# Patient Record
Sex: Male | Born: 1935
Health system: Southern US, Community
[De-identification: ages and names within clinical notes are randomized; demographics above are authoritative.]

## PROBLEM LIST (undated history)

## (undated) DIAGNOSIS — I739 Peripheral vascular disease, unspecified: Secondary | ICD-10-CM

## (undated) DIAGNOSIS — R339 Retention of urine, unspecified: Secondary | ICD-10-CM

## (undated) DIAGNOSIS — I10 Essential (primary) hypertension: Secondary | ICD-10-CM

## (undated) DIAGNOSIS — K759 Inflammatory liver disease, unspecified: Secondary | ICD-10-CM

## (undated) DIAGNOSIS — N200 Calculus of kidney: Secondary | ICD-10-CM

## (undated) DIAGNOSIS — R7989 Other specified abnormal findings of blood chemistry: Secondary | ICD-10-CM

## (undated) DIAGNOSIS — C61 Malignant neoplasm of prostate: Secondary | ICD-10-CM

## (undated) DIAGNOSIS — M549 Dorsalgia, unspecified: Secondary | ICD-10-CM

## (undated) DIAGNOSIS — R351 Nocturia: Secondary | ICD-10-CM

## (undated) DIAGNOSIS — L97909 Non-pressure chronic ulcer of unspecified part of unspecified lower leg with unspecified severity: Secondary | ICD-10-CM

## (undated) DIAGNOSIS — R945 Abnormal results of liver function studies: Secondary | ICD-10-CM

## (undated) DIAGNOSIS — E785 Hyperlipidemia, unspecified: Secondary | ICD-10-CM

## (undated) HISTORY — PX: TRANSLUMINAL ANGIOPLASTY: SHX274

## (undated) HISTORY — DX: Hyperlipidemia, unspecified: E78.5

## (undated) HISTORY — DX: Non-pressure chronic ulcer of unspecified part of unspecified lower leg with unspecified severity: L97.909

## (undated) HISTORY — DX: Nocturia: R35.1

## (undated) HISTORY — DX: Calculus of kidney: N20.0

## (undated) HISTORY — DX: Essential (primary) hypertension: I10

## (undated) HISTORY — DX: Retention of urine, unspecified: R33.9

## (undated) HISTORY — DX: Abnormal results of liver function studies: R94.5

## (undated) HISTORY — DX: Inflammatory liver disease, unspecified: K75.9

## (undated) HISTORY — DX: Malignant neoplasm of prostate: C61

## (undated) HISTORY — PX: PERITONEAL CATHETER INSERTION: SHX2223

## (undated) HISTORY — DX: Peripheral vascular disease, unspecified: I73.9

## (undated) HISTORY — PX: PROSTATE SURGERY: SHX751

## (undated) HISTORY — DX: Dorsalgia, unspecified: M54.9

## (undated) HISTORY — PX: AORTOGRAM: SHX6300

## (undated) HISTORY — DX: Other specified abnormal findings of blood chemistry: R79.89

---

## 2005-01-03 ENCOUNTER — Emergency Department: Payer: Self-pay | Admitting: Emergency Medicine

## 2005-07-18 ENCOUNTER — Ambulatory Visit: Payer: Self-pay

## 2005-08-12 ENCOUNTER — Ambulatory Visit: Payer: Self-pay

## 2005-09-04 ENCOUNTER — Ambulatory Visit: Payer: Self-pay | Admitting: Anesthesiology

## 2005-09-24 ENCOUNTER — Ambulatory Visit: Payer: Self-pay | Admitting: Anesthesiology

## 2005-10-07 ENCOUNTER — Ambulatory Visit: Payer: Self-pay | Admitting: Anesthesiology

## 2005-10-28 ENCOUNTER — Ambulatory Visit: Payer: Self-pay | Admitting: Anesthesiology

## 2005-12-18 ENCOUNTER — Ambulatory Visit: Payer: Self-pay | Admitting: Anesthesiology

## 2006-11-25 ENCOUNTER — Ambulatory Visit: Payer: Self-pay | Admitting: Urology

## 2006-11-25 ENCOUNTER — Other Ambulatory Visit: Payer: Self-pay

## 2006-12-02 ENCOUNTER — Ambulatory Visit: Payer: Self-pay | Admitting: Urology

## 2007-05-07 ENCOUNTER — Ambulatory Visit: Payer: Self-pay | Admitting: Gastroenterology

## 2007-07-14 ENCOUNTER — Ambulatory Visit: Payer: Self-pay | Admitting: General Practice

## 2007-08-27 ENCOUNTER — Ambulatory Visit: Payer: Self-pay | Admitting: Family Medicine

## 2010-04-05 ENCOUNTER — Ambulatory Visit: Payer: Self-pay | Admitting: Surgery

## 2010-04-09 ENCOUNTER — Ambulatory Visit: Payer: Self-pay | Admitting: Surgery

## 2010-10-16 ENCOUNTER — Ambulatory Visit: Payer: Self-pay | Admitting: Ophthalmology

## 2011-08-05 ENCOUNTER — Ambulatory Visit: Payer: Self-pay | Admitting: Vascular Surgery

## 2012-08-13 ENCOUNTER — Ambulatory Visit: Payer: Self-pay | Admitting: Family Medicine

## 2012-10-01 ENCOUNTER — Ambulatory Visit: Payer: Self-pay | Admitting: Urology

## 2012-10-15 ENCOUNTER — Ambulatory Visit: Payer: Self-pay | Admitting: Vascular Surgery

## 2012-10-15 LAB — CREATININE, SERUM
Creatinine: 1.87 mg/dL — ABNORMAL HIGH (ref 0.60–1.30)
EGFR (African American): 38 — ABNORMAL LOW
EGFR (Non-African Amer.): 33 — ABNORMAL LOW

## 2012-10-15 LAB — BUN: BUN: 22 mg/dL — ABNORMAL HIGH (ref 7–18)

## 2014-05-17 DIAGNOSIS — L568 Other specified acute skin changes due to ultraviolet radiation: Secondary | ICD-10-CM | POA: Insufficient documentation

## 2014-05-17 DIAGNOSIS — E7800 Pure hypercholesterolemia, unspecified: Secondary | ICD-10-CM | POA: Insufficient documentation

## 2014-05-17 DIAGNOSIS — I10 Essential (primary) hypertension: Secondary | ICD-10-CM | POA: Insufficient documentation

## 2015-04-18 NOTE — Op Note (Signed)
PATIENT NAME:  Jon Smith, Jon Smith MR#:  [id number] DATE OF BIRTH:  [birth year]  DATE OF PROCEDURE:  10/15/2012  PREOPERATIVE DIAGNOSES:  1. Peripheral arterial disease with rest pain, right lower extremity.  2. Status post previous left lower extremity intervention for ulceration.  3. Hypertension.   POSTOPERATIVE DIAGNOSES:  1. Peripheral arterial disease with rest pain, right lower extremity.  2. Status post previous left lower extremity intervention for ulceration.  3. Hypertension.   PROCEDURES:  1. Catheter placement to right peroneal artery from left femoral approach.  2. Aortogram and selective right lower extremity angiogram.  3. Percutaneous transluminal angioplasty of right tibioperoneal trunk and peroneal artery with a 3-mm diameter angioplasty balloon.   SURGEON: Algernon Huxley, M.D.   ANESTHESIA: Local with moderate conscious sedation.   ESTIMATED BLOOD LOSS:  Minimal.  FLUOROSCOPY TIME: Five minutes.   CONTRAST:  55 mL.   INDICATION FOR PROCEDURE: This is an 81 year old African American male with peripheral vascular disease. He has been complaining of worsening pain of the right foot and now has pain at rest. Noninvasive study showed some significant tibial disease. He is brought for evaluation with angiography and possible revascularization. Risks and benefits were discussed. Informed consent was obtained.   DESCRIPTION OF PROCEDURE: The patient is brought to the vascular and interventional radiology suite. His groin is sterilely prepped and draped and a sterile surgical field was created. The left femoral head was localized with fluoroscopy and the left femoral artery was accessed without difficulty with a Seldinger needle. A J-wire and 5 French sheath were placed. A pigtail catheter was placed in the aorta at the L1 level and AP aortogram was performed. This showed no flow-limiting stenosis in the aortoiliac segments. There was some mild disease of the left iliac artery. The  renal arteries were patent. There were some calcific changes to most of his aortic and iliac vessels. I hooked the aortic bifurcation and advanced to the right femoral head and selective right lower extremity angiogram was then performed. This demonstrated patent common femoral artery and profunda femoris artery. The superficial femoral artery had an approximately 40% stenosis in the distal segment but this was not flow-limiting. The popliteal artery was patent with mild disease but the tibioperoneal trunk was then occluded. The peroneal artery reconstituted about 6 to 8 cm downstream and was the best runoff, and then at the level of the ankle there was reconstitution of both the posterior tibial and anterior tibial arteries. The patient was systemically heparinized. A 6 French Ansel sheath was placed over a Terumo advantage wire. I was able to cross this occlusion with minimal difficulty and confirm intraluminal flow with a 135 diagnostic catheter in the mid peroneal artery. I then exchanged for an 0.018 wire. Percutaneous transluminal angioplasty was performed with a 3-mm diameter angioplasty balloon encompassing the tibioperoneal trunk and the peroneal artery occlusion. A waist was taken which resolved with angioplasty. Following angioplasty there was some vasospasm of the distal peroneal artery that improved with topical nitroglycerin administration. Markedly improved flow was seen and no significant residual stenosis. At this point I elected to terminate the procedure. The sheath was pulled back to the ipsilateral external iliac artery and oblique arteriogram was performed. StarClose closure device was deployed in the usual fashion with excellent hemostatic result. The patient tolerated the procedure well and was taken to the recovery room in stable condition.     ____________________________ Algernon Huxley, MD jsd:bjt D: 10/15/2012 14:49:44 ET T: 10/15/2012 15:37:05 ET JOB#: [id number]  cc: Algernon Huxley,  MD, <Dictator> Algernon Huxley MD ELECTRONICALLY SIGNED 10/19/2012 13:10

## 2015-08-30 ENCOUNTER — Encounter: Payer: Self-pay | Admitting: *Deleted

## 2015-08-30 ENCOUNTER — Emergency Department
Admission: EM | Admit: 2015-08-30 | Discharge: 2015-08-31 | Disposition: A | Discharge status: Home / Self Care | Payer: Medicare Other | Attending: Emergency Medicine | Admitting: Emergency Medicine

## 2015-08-30 DIAGNOSIS — Y998 Other external cause status: Secondary | ICD-10-CM | POA: Insufficient documentation

## 2015-08-30 DIAGNOSIS — W19XXXA Unspecified fall, initial encounter: Secondary | ICD-10-CM

## 2015-08-30 DIAGNOSIS — Y92002 Bathroom of unspecified non-institutional (private) residence single-family (private) house as the place of occurrence of the external cause: Secondary | ICD-10-CM | POA: Diagnosis not present

## 2015-08-30 DIAGNOSIS — Z720 Tobacco use: Secondary | ICD-10-CM | POA: Insufficient documentation

## 2015-08-30 DIAGNOSIS — Y93E1 Activity, personal bathing and showering: Secondary | ICD-10-CM | POA: Diagnosis not present

## 2015-08-30 DIAGNOSIS — S20211A Contusion of right front wall of thorax, initial encounter: Secondary | ICD-10-CM | POA: Insufficient documentation

## 2015-08-30 DIAGNOSIS — W16212A Fall in (into) filled bathtub causing other injury, initial encounter: Secondary | ICD-10-CM | POA: Diagnosis not present

## 2015-08-30 DIAGNOSIS — S24109A Unspecified injury at unspecified level of thoracic spinal cord, initial encounter: Secondary | ICD-10-CM | POA: Diagnosis not present

## 2015-08-30 DIAGNOSIS — M546 Pain in thoracic spine: Secondary | ICD-10-CM

## 2015-08-30 NOTE — ED Notes (Signed)
Pt to triage via wheelchair.  Pt has back pain after falling in the bathtub tonight.  No loc.  No vomiting.  Pt alert.

## 2015-08-31 ENCOUNTER — Emergency Department: Payer: Medicare Other

## 2015-08-31 ENCOUNTER — Other Ambulatory Visit: Payer: Self-pay

## 2015-08-31 DIAGNOSIS — S24109A Unspecified injury at unspecified level of thoracic spinal cord, initial encounter: Secondary | ICD-10-CM | POA: Diagnosis not present

## 2015-08-31 MED ORDER — HYDROCODONE-ACETAMINOPHEN 5-325 MG PO TABS
1.0000 | ORAL_TABLET | Freq: Once | ORAL | Status: AC
  Administered 2015-08-31: 1 via ORAL
  Filled 2015-08-31: qty 1

## 2015-08-31 MED ORDER — ONDANSETRON 4 MG PO TBDP
4.0000 mg | ORAL_TABLET | Freq: Once | ORAL | Status: AC
  Administered 2015-08-31: 4 mg via ORAL
  Filled 2015-08-31: qty 1

## 2015-08-31 MED ORDER — HYDROCODONE-ACETAMINOPHEN 5-325 MG PO TABS: 1.0000 | ORAL_TABLET | Freq: Four times a day (QID) | ORAL | Status: DC | PRN

## 2015-08-31 NOTE — ED Provider Notes (Signed)
[facility] Emergency Department Provider Note  ____________________________________________  Time seen: Approximately 12:14 AM  I have reviewed the triage vital signs and the nursing notes.   HISTORY  Chief Complaint Back Pain    HPI Jon Smith is a 81 y.o. male who presents to the ED from home with a chief complaint of back pain s/p mechanical fall in bathtub approximately 6 PM. Patient states he was showering when he slipped and fell, striking his right upper back against the tub. He denies striking head or LOC. Complains of 6/10 pain to right upper back which is sharp and nonradiating, worse with movement and deep breathing. Denies headache, neck pain, shortness of breath, abdominal pain, hematuria, numbness, tingling.   Past medical history 1.Peripheral arterial disease with rest pain, right lower extremity.  2.Status post previous left lower extremity intervention for ulceration.  3.Hypertension.   There are no active problems to display for this patient.  Past surgical history 1.Catheter placement to right peroneal artery from left femoral approach.  2.Aortogram and selective right lower extremity angiogram.  3.Percutaneous transluminal angioplasty of right tibioperoneal trunk and peroneal artery with a 3-mm diameter angioplasty balloon.   No current outpatient prescriptions on file.  Allergies Aspirin  No family history on file.  Social History Social History  Substance Use Topics  . Smoking status: Current Every Day Smoker  . Smokeless tobacco: Not on file  . Alcohol Use: No    Review of Systems Constitutional: No fever/chills Eyes: No visual changes. ENT: No sore throat. Cardiovascular: Denies chest pain. Respiratory: Denies shortness of breath. Gastrointestinal: No abdominal pain.  No nausea, no vomiting.  No diarrhea.  No constipation. Genitourinary: Negative for  dysuria. Musculoskeletal: Positive for back pain. Skin: Negative for rash. Neurological: Negative for headaches, focal weakness or numbness.  10-point ROS otherwise negative.  ____________________________________________   PHYSICAL EXAM:  VITAL SIGNS: ED Triage Vitals  Enc Vitals Group     BP 08/30/15 2204 160/71 mmHg     Pulse Rate 08/30/15 2204 71     Resp 08/30/15 2204 20     Temp 08/30/15 2204 98.2 F (36.8 C)     Temp Source 08/30/15 2204 Oral     SpO2 08/30/15 2204 100 %     Weight 08/30/15 2204 150 lb (68.04 kg)     Height 08/30/15 2204 5\' 9"  (1.753 m)     Head Cir --      Peak Flow --      Pain Score 08/30/15 2206 5     Pain Loc --      Pain Edu? --      Excl. in [place]? --     Constitutional: Alert and oriented. Well appearing and in no acute distress. Eyes: Conjunctivae are normal. PERRL. EOMI. Head: Atraumatic. Nose: No congestion/rhinnorhea. Mouth/Throat: Mucous membranes are moist.  Oropharynx non-erythematous. Neck: No stridor. No cervical spine tenderness to palpation. No step-offs or deformities noted. Cardiovascular: Normal rate, regular rhythm. Grossly normal heart sounds.  Good peripheral circulation. Respiratory: Normal respiratory effort.  No retractions. Lungs CTAB. Minimal splinting. Gastrointestinal: Soft and nontender. No distention. No abdominal bruits. No CVA tenderness. Musculoskeletal: Right upper thoracic back tender to palpation along upper posterior ribs. There is no midline spinal tenderness to thoracic or lumbar spine. No lower extremity tenderness nor edema.  No joint effusions. Neurologic:  Normal speech and language. No gross focal neurologic deficits are appreciated. No gait instability. Skin:  Skin is warm, dry and intact. No rash  noted. Psychiatric: Mood and affect are normal. Speech and behavior are normal.  ____________________________________________   LABS (all labs ordered are listed, but only abnormal results are  displayed)  Labs Reviewed - No data to display ____________________________________________  EKG  ED ECG REPORT I, Tyan Dy J, the attending physician, personally viewed and interpreted this ECG.   Date: 08/31/2015  EKG Time: 0008  Rate: 73  Rhythm: normal EKG, normal sinus rhythm  Axis: Normal  Intervals:none  ST&T Change: Nonspecific  ____________________________________________  RADIOLOGY  Chest 2 view (view by me, interpreted per Dr. Radene Knee): No acute cardiopulmonary process seen. No displaced rib fractures identified.  ____________________________________________   PROCEDURES  Procedure(s) performed: None  Critical Care performed: No  ____________________________________________   INITIAL IMPRESSION / ASSESSMENT AND PLAN / ED COURSE  Pertinent labs & imaging results that were available during my care of the patient were reviewed by me and considered in my medical decision making (see chart for details).  81 year old male s/p mechanical fall with right upper back pain. Will obtain imaging study to evaluate for rib fracture or pneumothorax. Will administer analgesia and reassess.  ----------------------------------------- 1:42 AM on 08/31/2015 -----------------------------------------  Patient improved. Impatient for discharge. Room air sats remained 100%. Strict return precautions given. Patient and family verbalized understanding and agree with plan of care. ____________________________________________   FINAL CLINICAL IMPRESSION(S) / ED DIAGNOSES  Final diagnoses:  Fall, initial encounter  Right-sided thoracic back pain  Rib contusion, right, initial encounter      Paulette Blanch, MD 08/31/15 [phone]

## 2015-08-31 NOTE — ED Notes (Signed)
Patient discharged to home per MD order. Patient in stable condition, and deemed medically cleared by ED provider for discharge. Discharge instructions reviewed with patient/family using "Teach Back"; verbalized understanding of medication education and administration, and information about follow-up care. Denies further concerns. ° °

## 2015-08-31 NOTE — Discharge Instructions (Signed)
1. Take pain medicine as needed (Norco #15). 2. Apply moist heat to affected area several times daily. 3. Return to the ER for worsening symptoms, fever, productive cough, difficulty breathing or other concerns.  Fall Prevention and Home Safety Falls cause injuries and can affect all age groups. It is possible to prevent falls.  HOW TO PREVENT FALLS  Wear shoes with rubber soles that do not have an opening for your toes.  Keep the inside and outside of your house well lit.  Use night lights throughout your home.  Remove clutter from floors.  Clean up floor spills.  Remove throw rugs or fasten them to the floor with carpet tape.  Do not place electrical cords across pathways.  Put grab bars by your tub, shower, and toilet. Do not use towel bars as grab bars.  Put handrails on both sides of the stairway. Fix loose handrails.  Do not climb on stools or stepladders, if possible.  Do not wax your floors.  Repair uneven or unsafe sidewalks, walkways, or stairs.  Keep items you use a lot within reach.  Be aware of pets.  Keep emergency numbers next to the telephone.  Put smoke detectors in your home and near bedrooms. Ask your doctor what other things you can do to prevent falls. Document Released: 10/12/2009 Document Revised: 06/16/2012 Document Reviewed: 03/17/2012 [facility] Patient Information 2015 [place], [place]. This information is not intended to replace advice given to you by your health care provider. Make sure you discuss any questions you have with your health care provider.  Back Pain, Adult Back pain is very common. The pain often gets better over time. The cause of back pain is usually not dangerous. Most people can learn to manage their back pain on their own.  HOME CARE   Stay active. Start with short walks on flat ground if you can. Try to walk farther each day.  Do not sit, drive, or stand in one place for more than 30 minutes. Do not stay in bed.  Do not  avoid exercise or work. Activity can help your back heal faster.  Be careful when you bend or lift an object. Bend at your knees, keep the object close to you, and do not twist.  Sleep on a firm mattress. Lie on your side, and bend your knees. If you lie on your back, put a pillow under your knees.  Only take medicines as told by your doctor.  Put ice on the injured area.  Put ice in a plastic bag.  Place a towel between your skin and the bag.  Leave the ice on for 15-20 minutes, 03-04 times a day for the first 2 to 3 days. After that, you can switch between ice and heat packs.  Ask your doctor about back exercises or massage.  Avoid feeling anxious or stressed. Find good ways to deal with stress, such as exercise. GET HELP RIGHT AWAY IF:   Your pain does not go away with rest or medicine.  Your pain does not go away in 1 week.  You have new problems.  You do not feel well.  The pain spreads into your legs.  You cannot control when you poop (bowel movement) or pee (urinate).  Your arms or legs feel weak or lose feeling (numbness).  You feel sick to your stomach (nauseous) or throw up (vomit).  You have belly (abdominal) pain.  You feel like you may pass out (faint). MAKE SURE YOU:   Understand  these instructions.  Will watch your condition.  Will get help right away if you are not doing well or get worse. Document Released: 06/03/2008 Document Revised: 03/09/2012 Document Reviewed: 04/19/2014 [facility] Patient Information 2015 [place], [place]. This information is not intended to replace advice given to you by your health care provider. Make sure you discuss any questions you have with your health care provider.  Rib Contusion A rib contusion (bruise) can occur by a blow to the chest or by a fall against a hard object. Usually these will be much better in a couple weeks. If X-rays were taken today and there are no broken bones (fractures), the diagnosis of bruising is  made. However, broken ribs may not show up for several days, or may be discovered later on a routine X-ray when signs of healing show up. If this happens to you, it does not mean that something was missed on the X-ray, but simply that it did not show up on the first X-rays. Earlier diagnosis will not usually change the treatment. HOME CARE INSTRUCTIONS   Avoid strenuous activity. Be careful during activities and avoid bumping the injured ribs. Activities that pull on the injured ribs and cause pain should be avoided, if possible.  For the first day or two, an ice pack used every 20 minutes while awake may be helpful. Put ice in a plastic bag and put a towel between the bag and the skin.  Eat a normal, well-balanced diet. Drink plenty of fluids to avoid constipation.  Take deep breaths several times a day to keep lungs free of infection. Try to cough several times a day. Splint the injured area with a pillow while coughing to ease pain. Coughing can help prevent pneumonia.  Wear a rib belt or binder only if told to do so by your caregiver. If you are wearing a rib belt or binder, you must do the breathing exercises as directed by your caregiver. If not used properly, rib belts or binders restrict breathing which can lead to pneumonia.  Only take over-the-counter or prescription medicines for pain, discomfort, or fever as directed by your caregiver. SEEK MEDICAL CARE IF:   You or your child has an oral temperature above 102 F (38.9 C).  Your baby is older than 3 months with a rectal temperature of 100.5 F (38.1 C) or higher for more than 1 day.  You develop a cough, with thick or bloody sputum. SEEK IMMEDIATE MEDICAL CARE IF:   You have difficulty breathing.  You feel sick to your stomach (nausea), have vomiting or belly (abdominal) pain.  You have worsening pain, not controlled with medications, or there is a change in the location of the pain.  You develop sweating or radiation of  the pain into the arms, jaw or shoulders, or become light headed or faint.  You or your child has an oral temperature above 102 F (38.9 C), not controlled by medicine.  Your or your baby is older than 3 months with a rectal temperature of 102 F (38.9 C) or higher.  Your baby is 66 months old or younger with a rectal temperature of 100.4 F (38 C) or higher. MAKE SURE YOU:   Understand these instructions.  Will watch your condition.  Will get help right away if you are not doing well or get worse. Document Released: 09/10/2001 Document Revised: 04/12/2013 Document Reviewed: 08/03/2008 [facility] Patient Information 2015 [place], [place]. This information is not intended to replace advice given to you by  your health care provider. Make sure you discuss any questions you have with your health care provider. ° °

## 2015-09-18 DIAGNOSIS — Z8546 Personal history of malignant neoplasm of prostate: Secondary | ICD-10-CM | POA: Insufficient documentation

## 2015-09-25 ENCOUNTER — Encounter: Payer: Self-pay | Admitting: *Deleted

## 2015-09-26 ENCOUNTER — Encounter: Payer: Self-pay | Admitting: *Deleted

## 2015-09-27 ENCOUNTER — Ambulatory Visit (INDEPENDENT_AMBULATORY_CARE_PROVIDER_SITE_OTHER): Payer: Medicare Other | Admitting: Obstetrics and Gynecology

## 2015-09-27 ENCOUNTER — Encounter: Payer: Self-pay | Admitting: Obstetrics and Gynecology

## 2015-09-27 VITALS — BP 140/68 | HR 72 | Resp 16 | Ht 69.0 in | Wt 141.3 lb

## 2015-09-27 DIAGNOSIS — R972 Elevated prostate specific antigen [PSA]: Secondary | ICD-10-CM

## 2015-09-27 DIAGNOSIS — R339 Retention of urine, unspecified: Secondary | ICD-10-CM | POA: Diagnosis not present

## 2015-09-27 LAB — BLADDER SCAN AMB NON-IMAGING

## 2015-09-27 MED ORDER — TAMSULOSIN HCL 0.4 MG PO CAPS: 0.4000 mg | ORAL_CAPSULE | Freq: Every day | ORAL | Status: DC

## 2015-09-27 NOTE — Progress Notes (Signed)
09/27/2015 1:14 PM   Jon Smith 12/15/1928 [id number]  Referring provider: Annice Needy, MD No address on file  Chief Complaint  Patient presents with  . Elevated PSA  . Establish Care    HPI: Patient is a 81 year old African-American male with a history of prostate cancer presenting today as a referral from his primary care provider for an elevated PSA of 5.44. Patient states that he was previously treated for his prostate cancer by Dr. Ernst Spell approximately 15-20 years ago using cryo ablation. He states that he never heard anything else from the office after his procedure and he has not followed up since. It seems that most recent PSA may be the only one checked since his cryoablation.  Patient denies frequency, urgency, bone pain or unexplained weight loss. He does report nocturia 1-2 times per night, weak stream and occasional sensation of incomplete bladder emptying.  He is a current 0.5 pack per day smoker 40 years.  He does report a history of kidney stones last stone episode approximately 8-9 years ago.  PMH: Past Medical History  Diagnosis Date  . Peripheral arterial disease   . Hypertension   . Lower extremity ulceration   . Back pain   . Prostate cancer 1996    Surgical History: Past Surgical History  Procedure Laterality Date  . Peritoneal catheter insertion    . Transluminal angioplasty    . Aortogram    . Prostate surgery      Home Medications:    Medication List       This list is accurate as of: 09/27/15  1:14 PM.  Always use your most recent med list.               amLODipine 5 MG tablet  Commonly known as:  NORVASC  Take by mouth.     aspirin EC 81 MG tablet  Take by mouth.     latanoprost 0.005 % ophthalmic solution  Commonly known as:  XALATAN     lisinopril 20 MG tablet  Commonly known as:  PRINIVIL,ZESTRIL     tamsulosin 0.4 MG Caps capsule  Commonly known as:  FLOMAX  Take 1 capsule (0.4 mg total) by mouth daily. At  bedtime        Allergies:  Allergies  Allergen Reactions  . Aspirin Nausea And Vomiting    Family History: Family History  Problem Relation Age of Onset  . Heart disease Brother   . Kidney Stones Sister     Social History:  reports that he has been smoking Cigarettes.  He does not have any smokeless tobacco history on file. He reports that he does not drink alcohol. His drug history is not on file.  ROS: UROLOGY Frequent Urination?: No Hard to postpone urination?: No Burning/pain with urination?: No Get up at night to urinate?: Yes Leakage of urine?: No Urine stream starts and stops?: Yes Trouble starting stream?: No Do you have to strain to urinate?: No Blood in urine?: No Urinary tract infection?: No Sexually transmitted disease?: No Injury to kidneys or bladder?: No Painful intercourse?: No Weak stream?: Yes Erection problems?: No Penile pain?: No  Gastrointestinal Nausea?: No Vomiting?: No Indigestion/heartburn?: No Diarrhea?: No Constipation?: No  Constitutional Fever: No Night sweats?: No Weight loss?: Yes Fatigue?: No  Skin Skin rash/lesions?: No Itching?: No  Eyes Blurred vision?: No Double vision?: No  Ears/Nose/Throat Sore throat?: No Sinus problems?: No  Hematologic/Lymphatic Swollen glands?: No Easy bruising?: No  Cardiovascular Leg swelling?: No Chest  pain?: No  Respiratory Cough?: No Shortness of breath?: No  Endocrine Excessive thirst?: No  Musculoskeletal Back pain?: No Joint pain?: No  Neurological Headaches?: No Dizziness?: No  Psychologic Depression?: No Anxiety?: Yes  Physical Exam: BP 140/68 mmHg  Pulse 72  Resp 16  Ht 5\' 9"  (1.753 m)  Wt 141 lb 4.8 oz (64.093 kg)  BMI 20.86 kg/m2  Constitutional:  Alert and oriented, No acute distress. HEENT: [place] AT, moist mucus membranes.  Trachea midline, no masses. Cardiovascular: No clubbing, cyanosis, or edema. Respiratory: Normal respiratory effort, no  increased work of breathing. GI: Abdomen is soft, nontender, nondistended, no abdominal masses GU: No CVA tenderness, testicles descended bilaterally without masses or tenderness, small right hydrocele  DRE:no clear margins palpapated, very small soft prostate Skin: No rashes, bruises or suspicious lesions. Lymph: No cervical or inguinal adenopathy. Neurologic: Grossly intact, no focal deficits, moving all 4 extremities. Psychiatric: Normal mood and affect.  Laboratory Data: No results found for: WBC, HGB, HCT, MCV, PLT  Lab Results  Component Value Date   CREATININE 1.87* 10/15/2012    No results found for: PSA  No results found for: TESTOSTERONE  No results found for: HGBA1C  Urinalysis No results found for: COLORURINE, APPEARANCEUR, LABSPEC, PHURINE, GLUCOSEU, HGBUR, BILIRUBINUR, KETONESUR, PROTEINUR, UROBILINOGEN, NITRITE, LEUKOCYTESUR  Pertinent Imaging:   Assessment & Plan:   1. Prostate Cancer-  S/p cryoablation 20 years ago by Dr. Madelin Headings no with PSA of 5.44. DRE unremarkable today. Discussed case with Dr. Pilar Jarvis.  Recommedations to recheck PSA in 6 months.  If levels continue to rise we may need to consider initiation of ADT. - Urinalysis, Complete - BLADDER SCAN AMB NON-IMAGING  2. Incomplete Bladder emptying- Urinary complaints of weak stream and occasional sensation of incomplete bladder emptying. PVR 234mL. Will start tamsulosin 1 tab daily and hopefully improve bladder emptying.  Return in about 6 months (around 03/26/2016) for Repeat PSA/DRE/PVR.  Herbert Moors, [facility] [address] [place], [place] [address] [phone]

## 2015-09-29 LAB — CULTURE, URINE COMPREHENSIVE

## 2015-10-02 ENCOUNTER — Telehealth: Payer: Self-pay | Admitting: Obstetrics and Gynecology

## 2015-10-02 LAB — MICROSCOPIC EXAMINATION: RBC MICROSCOPIC, UA: NONE SEEN /HPF (ref 0–?)

## 2015-10-02 LAB — URINALYSIS, COMPLETE
Bilirubin, UA: NEGATIVE
GLUCOSE, UA: NEGATIVE
KETONES UA: NEGATIVE
NITRITE UA: NEGATIVE
PROTEIN UA: NEGATIVE
RBC, UA: NEGATIVE
SPEC GRAV UA: 1.02 (ref 1.005–1.030)
UUROB: 0.2 mg/dL (ref 0.2–1.0)
pH, UA: 5.5 (ref 5.0–7.5)

## 2015-10-02 MED ORDER — SULFAMETHOXAZOLE-TRIMETHOPRIM 800-160 MG PO TABS: 1.0000 | ORAL_TABLET | Freq: Two times a day (BID) | ORAL | Status: DC

## 2015-10-02 NOTE — Telephone Encounter (Signed)
Patient notified/SW 

## 2015-10-02 NOTE — Telephone Encounter (Signed)
Please notify patient that his urine culture was positive for infection. I have sent a prescription for Bactrim to his pharmacy which he needs to complete. Thanks

## 2015-12-06 ENCOUNTER — Emergency Department: Payer: Medicare Other

## 2015-12-06 ENCOUNTER — Inpatient Hospital Stay
Admit: 2015-12-06 | Discharge: 2015-12-06 | Disposition: A | Discharge status: Home / Self Care | Payer: Medicare Other | Attending: Internal Medicine | Admitting: Internal Medicine

## 2015-12-06 ENCOUNTER — Encounter: Payer: Self-pay | Admitting: *Deleted

## 2015-12-06 ENCOUNTER — Inpatient Hospital Stay
Admission: EM | Admit: 2015-12-06 | Discharge: 2015-12-13 | DRG: 682 | Disposition: A | Discharge status: Home / Self Care | Payer: Medicare Other | Attending: Internal Medicine | Admitting: Internal Medicine

## 2015-12-06 DIAGNOSIS — E861 Hypovolemia: Secondary | ICD-10-CM | POA: Diagnosis present

## 2015-12-06 DIAGNOSIS — R7989 Other specified abnormal findings of blood chemistry: Secondary | ICD-10-CM | POA: Diagnosis present

## 2015-12-06 DIAGNOSIS — Y9289 Other specified places as the place of occurrence of the external cause: Secondary | ICD-10-CM | POA: Diagnosis not present

## 2015-12-06 DIAGNOSIS — R55 Syncope and collapse: Secondary | ICD-10-CM | POA: Diagnosis present

## 2015-12-06 DIAGNOSIS — E43 Unspecified severe protein-calorie malnutrition: Secondary | ICD-10-CM | POA: Diagnosis present

## 2015-12-06 DIAGNOSIS — Z888 Allergy status to other drugs, medicaments and biological substances status: Secondary | ICD-10-CM | POA: Diagnosis not present

## 2015-12-06 DIAGNOSIS — B9620 Unspecified Escherichia coli [E. coli] as the cause of diseases classified elsewhere: Secondary | ICD-10-CM | POA: Diagnosis present

## 2015-12-06 DIAGNOSIS — D62 Acute posthemorrhagic anemia: Secondary | ICD-10-CM | POA: Diagnosis present

## 2015-12-06 DIAGNOSIS — Z8249 Family history of ischemic heart disease and other diseases of the circulatory system: Secondary | ICD-10-CM | POA: Diagnosis not present

## 2015-12-06 DIAGNOSIS — E871 Hypo-osmolality and hyponatremia: Secondary | ICD-10-CM | POA: Diagnosis present

## 2015-12-06 DIAGNOSIS — E785 Hyperlipidemia, unspecified: Secondary | ICD-10-CM | POA: Diagnosis present

## 2015-12-06 DIAGNOSIS — I739 Peripheral vascular disease, unspecified: Secondary | ICD-10-CM | POA: Diagnosis present

## 2015-12-06 DIAGNOSIS — N400 Enlarged prostate without lower urinary tract symptoms: Secondary | ICD-10-CM | POA: Diagnosis present

## 2015-12-06 DIAGNOSIS — K8020 Calculus of gallbladder without cholecystitis without obstruction: Secondary | ICD-10-CM | POA: Diagnosis present

## 2015-12-06 DIAGNOSIS — Z87442 Personal history of urinary calculi: Secondary | ICD-10-CM

## 2015-12-06 DIAGNOSIS — F17210 Nicotine dependence, cigarettes, uncomplicated: Secondary | ICD-10-CM | POA: Diagnosis present

## 2015-12-06 DIAGNOSIS — H409 Unspecified glaucoma: Secondary | ICD-10-CM | POA: Diagnosis present

## 2015-12-06 DIAGNOSIS — Z9889 Other specified postprocedural states: Secondary | ICD-10-CM | POA: Diagnosis not present

## 2015-12-06 DIAGNOSIS — Z6820 Body mass index (BMI) 20.0-20.9, adult: Secondary | ICD-10-CM

## 2015-12-06 DIAGNOSIS — E7800 Pure hypercholesterolemia, unspecified: Secondary | ICD-10-CM | POA: Diagnosis present

## 2015-12-06 DIAGNOSIS — I10 Essential (primary) hypertension: Secondary | ICD-10-CM | POA: Diagnosis present

## 2015-12-06 DIAGNOSIS — Z9861 Coronary angioplasty status: Secondary | ICD-10-CM

## 2015-12-06 DIAGNOSIS — Z8546 Personal history of malignant neoplasm of prostate: Secondary | ICD-10-CM

## 2015-12-06 DIAGNOSIS — Z79899 Other long term (current) drug therapy: Secondary | ICD-10-CM

## 2015-12-06 DIAGNOSIS — R310 Gross hematuria: Secondary | ICD-10-CM

## 2015-12-06 DIAGNOSIS — N179 Acute kidney failure, unspecified: Secondary | ICD-10-CM | POA: Diagnosis not present

## 2015-12-06 DIAGNOSIS — N323 Diverticulum of bladder: Secondary | ICD-10-CM | POA: Diagnosis present

## 2015-12-06 DIAGNOSIS — D649 Anemia, unspecified: Secondary | ICD-10-CM | POA: Diagnosis not present

## 2015-12-06 DIAGNOSIS — R972 Elevated prostate specific antigen [PSA]: Secondary | ICD-10-CM | POA: Diagnosis not present

## 2015-12-06 DIAGNOSIS — R630 Anorexia: Secondary | ICD-10-CM | POA: Diagnosis present

## 2015-12-06 DIAGNOSIS — Z7982 Long term (current) use of aspirin: Secondary | ICD-10-CM | POA: Diagnosis not present

## 2015-12-06 DIAGNOSIS — R778 Other specified abnormalities of plasma proteins: Secondary | ICD-10-CM

## 2015-12-06 DIAGNOSIS — N3289 Other specified disorders of bladder: Secondary | ICD-10-CM | POA: Diagnosis present

## 2015-12-06 DIAGNOSIS — T370X5A Adverse effect of sulfonamides, initial encounter: Secondary | ICD-10-CM | POA: Diagnosis present

## 2015-12-06 DIAGNOSIS — D689 Coagulation defect, unspecified: Secondary | ICD-10-CM | POA: Diagnosis present

## 2015-12-06 DIAGNOSIS — Z1623 Resistance to quinolones and fluoroquinolones: Secondary | ICD-10-CM | POA: Diagnosis present

## 2015-12-06 DIAGNOSIS — W19XXXA Unspecified fall, initial encounter: Secondary | ICD-10-CM | POA: Diagnosis present

## 2015-12-06 DIAGNOSIS — Z716 Tobacco abuse counseling: Secondary | ICD-10-CM

## 2015-12-06 DIAGNOSIS — E860 Dehydration: Secondary | ICD-10-CM | POA: Diagnosis present

## 2015-12-06 DIAGNOSIS — N390 Urinary tract infection, site not specified: Secondary | ICD-10-CM | POA: Diagnosis present

## 2015-12-06 DIAGNOSIS — R945 Abnormal results of liver function studies: Secondary | ICD-10-CM | POA: Diagnosis present

## 2015-12-06 DIAGNOSIS — B179 Acute viral hepatitis, unspecified: Secondary | ICD-10-CM | POA: Diagnosis present

## 2015-12-06 LAB — URINALYSIS COMPLETE WITH MICROSCOPIC (ARMC ONLY)
Bilirubin Urine: NEGATIVE
Glucose, UA: NEGATIVE mg/dL
KETONES UR: NEGATIVE mg/dL
Nitrite: NEGATIVE
PROTEIN: NEGATIVE mg/dL
SPECIFIC GRAVITY, URINE: 1.019 (ref 1.005–1.030)
SQUAMOUS EPITHELIAL / LPF: NONE SEEN
pH: 5 (ref 5.0–8.0)

## 2015-12-06 LAB — COMPREHENSIVE METABOLIC PANEL
ALBUMIN: 3.4 g/dL — AB (ref 3.5–5.0)
ALT: 1198 U/L — ABNORMAL HIGH (ref 17–63)
ANION GAP: 6 (ref 5–15)
AST: 936 U/L — AB (ref 15–41)
Alkaline Phosphatase: 352 U/L — ABNORMAL HIGH (ref 38–126)
BUN: 34 mg/dL — ABNORMAL HIGH (ref 6–20)
CO2: 21 mmol/L — AB (ref 22–32)
Calcium: 9.3 mg/dL (ref 8.9–10.3)
Chloride: 107 mmol/L (ref 101–111)
Creatinine, Ser: 1.6 mg/dL — ABNORMAL HIGH (ref 0.61–1.24)
GFR calc Af Amer: 44 mL/min — ABNORMAL LOW (ref >60)
GFR calc non Af Amer: 38 mL/min — ABNORMAL LOW (ref >60)
GLUCOSE: 122 mg/dL — AB (ref 65–99)
POTASSIUM: 4.4 mmol/L (ref 3.5–5.1)
SODIUM: 134 mmol/L — AB (ref 135–145)
Total Bilirubin: 1 mg/dL (ref 0.3–1.2)
Total Protein: 7.1 g/dL (ref 6.5–8.1)

## 2015-12-06 LAB — PROTIME-INR
INR: 1.47
Prothrombin Time: 17.9 seconds — ABNORMAL HIGH (ref 11.4–15.0)

## 2015-12-06 LAB — CBC WITH DIFFERENTIAL/PLATELET
BASOS ABS: 0.1 10*3/uL (ref 0–0.1)
Basophils Relative: 2 %
EOS PCT: 1 %
Eosinophils Absolute: 0 10*3/uL (ref 0–0.7)
HEMATOCRIT: 35 % — AB (ref 40.0–52.0)
HEMOGLOBIN: 11.3 g/dL — AB (ref 13.0–18.0)
LYMPHS PCT: 19 %
Lymphs Abs: 1 10*3/uL (ref 1.0–3.6)
MCH: 26.5 pg (ref 26.0–34.0)
MCHC: 32.2 g/dL (ref 32.0–36.0)
MCV: 82.2 fL (ref 80.0–100.0)
Monocytes Absolute: 0.6 10*3/uL (ref 0.2–1.0)
Monocytes Relative: 12 %
NEUTROS ABS: 3.3 10*3/uL (ref 1.4–6.5)
NEUTROS PCT: 66 %
PLATELETS: 236 10*3/uL (ref 150–440)
RBC: 4.26 MIL/uL — AB (ref 4.40–5.90)
RDW: 19.3 % — ABNORMAL HIGH (ref 11.5–14.5)
WBC: 5 10*3/uL (ref 3.8–10.6)

## 2015-12-06 LAB — TROPONIN I
Troponin I: 0.04 ng/mL — ABNORMAL HIGH (ref <0.031)
Troponin I: 0.03 ng/mL (ref <0.031)

## 2015-12-06 LAB — LACTIC ACID, PLASMA
LACTIC ACID, VENOUS: 2.1 mmol/L — AB (ref 0.5–2.0)
Lactic Acid, Venous: 2 mmol/L (ref 0.5–2.0)

## 2015-12-06 LAB — APTT: APTT: 33 s (ref 24–36)

## 2015-12-06 LAB — LIPASE, BLOOD: Lipase: 50 U/L (ref 11–51)

## 2015-12-06 MED ORDER — ACETAMINOPHEN 325 MG PO TABS: 650.0000 mg | ORAL_TABLET | Freq: Four times a day (QID) | ORAL | Status: DC | PRN

## 2015-12-06 MED ORDER — NICOTINE 14 MG/24HR TD PT24
14.0000 mg | MEDICATED_PATCH | Freq: Every day | TRANSDERMAL | Status: DC
Start: 2015-12-06 — End: 2015-12-13
  Administered 2015-12-06 – 2015-12-13 (×8): 14 mg via TRANSDERMAL
  Filled 2015-12-06 (×8): qty 1

## 2015-12-06 MED ORDER — AMLODIPINE BESYLATE 5 MG PO TABS
2.5000 mg | ORAL_TABLET | Freq: Every day | ORAL | Status: DC
  Administered 2015-12-06 – 2015-12-11 (×6): 2.5 mg via ORAL
  Filled 2015-12-06 (×7): qty 1

## 2015-12-06 MED ORDER — ONDANSETRON HCL 4 MG/2ML IJ SOLN
4.0000 mg | Freq: Four times a day (QID) | INTRAMUSCULAR | Status: DC | PRN
  Administered 2015-12-08: 4 mg via INTRAVENOUS
  Filled 2015-12-06: qty 2

## 2015-12-06 MED ORDER — CLOPIDOGREL BISULFATE 75 MG PO TABS
75.0000 mg | ORAL_TABLET | Freq: Every day | ORAL | Status: DC
  Administered 2015-12-06 – 2015-12-07 (×2): 75 mg via ORAL
  Filled 2015-12-06 (×2): qty 1

## 2015-12-06 MED ORDER — SODIUM CHLORIDE 0.9 % IV SOLN
INTRAVENOUS | Status: DC
  Administered 2015-12-06 – 2015-12-09 (×5): via INTRAVENOUS

## 2015-12-06 MED ORDER — HEPARIN SODIUM (PORCINE) 5000 UNIT/ML IJ SOLN
5000.0000 [IU] | Freq: Three times a day (TID) | INTRAMUSCULAR | Status: DC
Start: 2015-12-06 — End: 2015-12-09
  Administered 2015-12-06 – 2015-12-09 (×10): 5000 [IU] via SUBCUTANEOUS
  Filled 2015-12-06 (×10): qty 1

## 2015-12-06 MED ORDER — ACETAMINOPHEN 650 MG RE SUPP: 650.0000 mg | Freq: Four times a day (QID) | RECTAL | Status: DC | PRN

## 2015-12-06 MED ORDER — ALBUTEROL SULFATE (2.5 MG/3ML) 0.083% IN NEBU: 2.5000 mg | INHALATION_SOLUTION | RESPIRATORY_TRACT | Status: DC | PRN

## 2015-12-06 MED ORDER — ONDANSETRON HCL 4 MG PO TABS: 4.0000 mg | ORAL_TABLET | Freq: Four times a day (QID) | ORAL | Status: DC | PRN

## 2015-12-06 NOTE — H&P (Signed)
[facility] NAME: Jon Smith    MR#:  [id number]  DATE OF BIRTH:  13-Apr-1928  DATE OF ADMISSION:  12/06/2015  PRIMARY CARE PHYSICIAN: Annice Needy, MD   REQUESTING/REFERRING PHYSICIAN: Nena Polio, MD  CHIEF COMPLAINT:   Chief Complaint  Patient presents with  . Fall    HISTORY OF PRESENT ILLNESS:  Jon Smith  is a 81 y.o. male with a known history of hypertension, PAD and back pain. The patient has had the poor appetite and oral intake for the past the couple days. He feels not good in the restaurant and passed out for minutes today. But he denies any headache, dizziness or weakness. He denies any seizure or incontinence.  PAST MEDICAL HISTORY:   Past Medical History  Diagnosis Date  . Peripheral arterial disease ([place])   . Hypertension   . Lower extremity ulceration ([place])   . Back pain     PAST SURGICAL HISTORY:   Past Surgical History  Procedure Laterality Date  . Peritoneal catheter insertion    . Transluminal angioplasty    . Aortogram    . Prostate surgery      SOCIAL HISTORY:   Social History  Substance Use Topics  . Smoking status: Current Every Day Smoker    Types: Cigarettes  . Smokeless tobacco: Not on file  . Alcohol Use: No    FAMILY HISTORY:   Family History  Problem Relation Age of Onset  . Heart disease Brother   . Kidney Stones Sister     DRUG ALLERGIES:   Allergies  Allergen Reactions  . Aspirin Nausea And Vomiting    REVIEW OF SYSTEMS:  CONSTITUTIONAL: No fever, fatigue or weakness.  EYES: No blurred or double vision.  EARS, NOSE, AND THROAT: No tinnitus or ear pain.  RESPIRATORY: No cough, shortness of breath, wheezing or hemoptysis.  CARDIOVASCULAR: No chest pain, orthopnea, edema.  GASTROINTESTINAL: No nausea, vomiting, diarrhea or abdominal pain.  GENITOURINARY: No dysuria, hematuria.  ENDOCRINE: No polyuria, nocturia,  HEMATOLOGY: No anemia, easy  bruising or bleeding SKIN: No rash or lesion. MUSCULOSKELETAL: No joint pain or arthritis.   NEUROLOGIC: No tingling, numbness, weakness. Syncope but no seizure or incontinence. PSYCHIATRY: No anxiety or depression.   MEDICATIONS AT HOME:   Prior to Admission medications   Medication Sig Start Date End Date Taking? Authorizing Provider  amLODipine (NORVASC) 5 MG tablet Take by mouth. 12/01/14 12/01/15  Historical Provider, MD  aspirin EC 81 MG tablet Take by mouth.    Historical Provider, MD  latanoprost (XALATAN) 0.005 % ophthalmic solution  09/23/15   Historical Provider, MD  lisinopril (PRINIVIL,ZESTRIL) 20 MG tablet  09/25/15   Historical Provider, MD  sulfamethoxazole-trimethoprim (BACTRIM DS,SEPTRA DS) 800-160 MG tablet Take 1 tablet by mouth 2 (two) times daily. 10/02/15   Roda Shutters, FNP  tamsulosin (FLOMAX) 0.4 MG CAPS capsule Take 1 capsule (0.4 mg total) by mouth daily. At bedtime 09/27/15   Roda Shutters, FNP      VITAL SIGNS:  Blood pressure 157/70, pulse 74, temperature 98.6 F (37 C), temperature source Oral, resp. rate 22, height 5\' 9"  (1.753 m), weight 64.411 kg (142 lb), SpO2 100 %.  PHYSICAL EXAMINATION:  GENERAL:  81 y.o.-year-old patient lying in the bed with no acute distress.  EYES: Pupils equal, round, reactive to light and accommodation. No scleral icterus. Extraocular muscles intact.  HEENT: Head atraumatic, normocephalic. Oropharynx and nasopharynx clear. Dry oral  mucosa. NECK:  Supple, no jugular venous distention. No thyroid enlargement, no tenderness.  LUNGS: Normal breath sounds bilaterally, no wheezing, rales,rhonchi or crepitation. No use of accessory muscles of respiration.  CARDIOVASCULAR: S1, S2 normal. No murmurs, rubs, or gallops.  ABDOMEN: Soft, nontender, nondistended. Bowel sounds present. No organomegaly or mass.  EXTREMITIES: No pedal edema, cyanosis, or clubbing.  NEUROLOGIC: Cranial nerves II through XII are intact. Muscle strength 5/5  in all extremities. Sensation intact. Gait not checked.  PSYCHIATRIC: The patient is alert and oriented x 3.  SKIN: No obvious rash, lesion, or ulcer.   LABORATORY PANEL:   CBC  Recent Labs Lab 12/06/15 1238  WBC 5.0  HGB 11.3*  HCT 35.0*  PLT 236   ------------------------------------------------------------------------------------------------------------------  Chemistries   Recent Labs Lab 12/06/15 1238  NA 134*  K 4.4  CL 107  CO2 21*  GLUCOSE 122*  BUN 34*  CREATININE 1.60*  CALCIUM 9.3  AST 936*  ALT 1198*  ALKPHOS 352*  BILITOT 1.0   ------------------------------------------------------------------------------------------------------------------  Cardiac Enzymes  Recent Labs Lab 12/06/15 1238  TROPONINI 0.04*   ------------------------------------------------------------------------------------------------------------------  RADIOLOGY:  Dg Chest 2 View  12/06/2015  CLINICAL DATA:  [place] from stool.  Syncope. EXAM: CHEST  2 VIEW COMPARISON:  08/31/2015 FINDINGS: Mild hyperinflation of the lungs. Heart and mediastinal contours are within normal limits. No focal opacities or effusions. No acute bony abnormality. IMPRESSION: Hyperinflation.  No active cardiopulmonary disease. Electronically Signed   By: Rolm Baptise M.D.   On: 12/06/2015 13:02   Ct Head Wo Contrast  12/06/2015  CLINICAL DATA:  81 year old male who fell backwards at rest Ron with syncope. Initial encounter. EXAM: CT HEAD WITHOUT CONTRAST TECHNIQUE: Contiguous axial images were obtained from the base of the skull through the vertex without intravenous contrast. COMPARISON:  None. FINDINGS: No scalp hematoma identified. Visualized paranasal sinuses and mastoids are clear. Calvarium intact. No acute osseous abnormality identified. No acute orbit findings, postoperative changes to the right globe. Calcified atherosclerosis at the skull base. Dural calcifications. Generalized cerebral volume loss  congruent with age. No ventriculomegaly. No midline shift, mass effect, or evidence of intracranial mass lesion. No acute intracranial hemorrhage identified. Patchy and confluent cerebral white matter hypodensity. Heterogeneous density also suggested in the pons (series 2, image 9). Small focus of hypodensity along the lateral left caudate nucleus. No cortically based acute infarct identified. No suspicious intracranial vascular hyperdensity. IMPRESSION: 1. No acute intracranial abnormality. No acute traumatic injury identified. 2. Hypodense changes in the white matter and pons suggestive of chronic small vessel disease. Electronically Signed   By: Genevie Ann M.D.   On: 12/06/2015 12:47   Dg Chest Portable 1 View  12/06/2015  CLINICAL DATA:  Syncope EXAM: PORTABLE CHEST 1 VIEW COMPARISON:  12/06/2015 FINDINGS: Cardiomediastinal silhouette is stable. Hyperinflation again noted. No acute infiltrate or pleural effusion. No pulmonary edema. Degenerative changes bilateral acromioclavicular joints. IMPRESSION: No active disease.  Hyperinflation again noted. Electronically Signed   By: Lahoma Crocker M.D.   On: 12/06/2015 13:55   US Abdomen Limited Ruq  12/06/2015  ADDENDUM REPORT: 12/06/2015 15:46 ADDENDUM: There is trace pericholecystic fluid. Clinical correlation is necessary to exclude early cholecystitis. Electronically Signed   By: Lahoma Crocker M.D.   On: 12/06/2015 15:46  12/06/2015  CLINICAL DATA:  Elevated LFTs EXAM: US ABDOMEN LIMITED - RIGHT UPPER QUADRANT COMPARISON:  None. FINDINGS: Gallbladder: Layering debris is noted within gallbladder. Small gallstone measures 5.8 mm. Borderline thickening of gallbladder wall up to 3.3  mm. No sonographic Murphy's sign. Common bile duct: Diameter: 6.8 mm in diameter within normal limits. Liver: No focal lesion identified. Within normal limits in parenchymal echogenicity. IMPRESSION: 1. Layering sludge and small gallstone is noted within gallbladder measures 5.8 mm. Borderline  thickening of gallbladder wall without sonographic Murphy's sign. Normal CBD. Electronically Signed: By: Lahoma Crocker M.D. On: 12/06/2015 15:14    EKG:   Orders placed or performed during the hospital encounter of 12/06/15  . ED EKG  . ED EKG  . EKG 12-Lead  . EKG 12-Lead    IMPRESSION AND PLAN:   Abnormal liver function test Syncope Acute renal failure Hyponatremia Cholelithiasis Hyperlipidemia Hypertension Tobacco abuse   the patient is admitted to medical floor with telemetry monitor.   I will hold Lipitor due to abnormal liver function test, I will request GI consult and follow-up CMP.  since of abdominal ultrasound show sludges and gallstone, I will request surgical consult.   start IV fluid and follow-up renal function. Follow-up troponin, unable to give aspirin due to allergy,  will give Plavix . Get carotid duplex and echocardiogram.  continue Norvasc for hypertension. Smoking cessation was counseled for 3-4 minutes, give nicotine patch.   All the records are reviewed and case discussed with ED provider. Management plans discussed with the patient, family and they are in agreement.  CODE STATUS: Full code  TOTAL TIME TAKING CARE OF THIS PATIENT: 57 minutes.    Demetrios Loll M.D on 12/06/2015 at 5:53 PM  Between 7am to 6pm - Pager - [phone]  After 6pm go to [email or web address] - password [email or web address] [facility]  Office  [phone]  CC: Primary care physician; Annice Needy, MD

## 2015-12-06 NOTE — ED Notes (Signed)
Pt attempt to urinate, unable to do so at this time.

## 2015-12-06 NOTE — ED Notes (Signed)
0.04 troponin reported to Dr Cinda Quest

## 2015-12-06 NOTE — Progress Notes (Signed)
Patients family requesting to see MD in person. Spoke with DR downstairs in ED who said someone would come up to see him tonight. No specific time given.

## 2015-12-06 NOTE — ED Notes (Signed)
Pt eating in bed at this time.

## 2015-12-06 NOTE — Progress Notes (Signed)
Spoke with family who were present in room and they stated that they did speak with a DR via phone. Family was ok with this and went home.

## 2015-12-06 NOTE — Progress Notes (Signed)
Called Dr. Bridgett Larsson and notified that patient's family member has questions regarding plan of care. Patient requested to see the doctor but dr. Bridgett Larsson refused to come. Talked to supervisor Estill Batten) about the issue. She advised to call or page Dr. Bridgett Larsson back. So, I paged dr. Bridgett Larsson back but he has not responded my paged . Night shift nurse made aware of the issue.

## 2015-12-06 NOTE — ED Notes (Signed)
Patient transported to Ultrasound 

## 2015-12-06 NOTE — ED Provider Notes (Signed)
[facility] Emergency Department Provider Note  ____________________________________________  Time seen: Approximately 1:16 PM  I have reviewed the triage vital signs and the nursing notes.   HISTORY  Chief Complaint Fall   HPI Jon Smith is a 81 y.o. male who reports he has not been feeling really hungry for the last couple days he has been making himself eat. He denies any fever chills pain anywhere just doesn't feel good and doesn't want doesn't feel like he needs to eat anything and is not hungry. Patient reports that today he was sitting on a stool in the restaurant when his sandwich that he ordered came he said he has to go have it put the box and go back home and eat because he really didn't feel good and then he remembers falling backwards and his friend that was with him and another man put him and picked him up off the floor put him in a booth he does not remember hitting the floor or anything else that happened until he woke up in the booth. Were he had something support his back patient now denies "doesn't feel good."He does seems to make it better or worse he reports he does not feel depressed. He is taking all of his medicines which she only takes 314 prostate one for high cholesterol and for blood pressure   Past Medical History  Diagnosis Date  . Peripheral arterial disease ([place])   . Hypertension   . Lower extremity ulceration ([place])   . Back pain     Patient Active Problem List   Diagnosis Date Noted  . Abnormal liver function tests 12/06/2015  . H/O malignant neoplasm of prostate 09/18/2015  . Hypercholesteremia 05/17/2014  . BP (high blood pressure) 05/17/2014  . Photodermatitis due to sun 05/17/2014    Past Surgical History  Procedure Laterality Date  . Peritoneal catheter insertion    . Transluminal angioplasty    . Aortogram    . Prostate surgery      Current Outpatient Rx  Name  Route  Sig  Dispense  Refill  . EXPIRED:  amLODipine (NORVASC) 5 MG tablet   Oral   Take by mouth.         Marland Kitchen aspirin EC 81 MG tablet   Oral   Take by mouth.         . latanoprost (XALATAN) 0.005 % ophthalmic solution               . lisinopril (PRINIVIL,ZESTRIL) 20 MG tablet               . sulfamethoxazole-trimethoprim (BACTRIM DS,SEPTRA DS) 800-160 MG tablet   Oral   Take 1 tablet by mouth 2 (two) times daily.   14 tablet   0   . tamsulosin (FLOMAX) 0.4 MG CAPS capsule   Oral   Take 1 capsule (0.4 mg total) by mouth daily. At bedtime   90 capsule   3     Allergies Aspirin  Family History  Problem Relation Age of Onset  . Heart disease Brother   . Kidney Stones Sister     Social History Social History  Substance Use Topics  . Smoking status: Current Every Day Smoker    Types: Cigarettes  . Smokeless tobacco: None  . Alcohol Use: No    Review of Systems Constitutional: No fever/chills Eyes: No visual changes. ENT: No sore throat. Cardiovascular: Denies chest pain. Respiratory: Denies shortness of breath. Gastrointestinal: No abdominal pain.  No  nausea, no vomiting.  No diarrhea.  No constipation. Genitourinary: Negative for dysuria. Musculoskeletal: Negative for back pain. Skin: Negative for rash. Neurological: Negative for headaches, focal weakness or numbness.  10-point ROS otherwise negative.  ____________________________________________   PHYSICAL EXAM:  VITAL SIGNS: ED Triage Vitals  Enc Vitals Group     BP 12/06/15 1213 150/67 mmHg     Pulse Rate 12/06/15 1213 76     Resp 12/06/15 1213 16     Temp 12/06/15 1213 97.7 F (36.5 C)     Temp Source 12/06/15 1213 Oral     SpO2 12/06/15 1213 98 %     Weight 12/06/15 1213 142 lb (64.411 kg)     Height 12/06/15 1213 5\' 9"  (1.753 m)     Head Cir --      Peak Flow --      Pain Score --      Pain Loc --      Pain Edu? --      Excl. in [place]? --     Constitutional: Alert and oriented. Well appearing and in no acute  distress. Eyes: Conjunctivae are normal. PERRL. EOMI. Head: Atraumatic. Nose: No congestion/rhinnorhea. Mouth/Throat: Mucous membranes are moist.  Oropharynx non-erythematous. Neck: No stridor.   Cardiovascular: Normal rate, regular rhythm. Grossly normal heart sounds.  Good peripheral circulation. Respiratory: Normal respiratory effort.  No retractions. Lungs CTAB. Gastrointestinal: Soft and nontender. No distention. No abdominal bruits. No CVA tenderness. }Musculoskeletal: No lower extremity tenderness nor edema.  No joint effusions. Neurologic:  Normal speech and language. No gross focal neurologic deficits are appreciated.  Skin:  Skin is warm, dry and intact. No rash noted.   ____________________________________________   LABS (all labs ordered are listed, but only abnormal results are displayed)  Labs Reviewed  COMPREHENSIVE METABOLIC PANEL - Abnormal; Notable for the following:    Sodium 134 (*)    CO2 21 (*)    Glucose, Bld 122 (*)    BUN 34 (*)    Creatinine, Ser 1.60 (*)    Albumin 3.4 (*)    AST 936 (*)    ALT 1198 (*)    Alkaline Phosphatase 352 (*)    GFR calc non Af Amer 38 (*)    GFR calc Af Amer 44 (*)    All other components within normal limits  TROPONIN I - Abnormal; Notable for the following:    Troponin I 0.04 (*)    All other components within normal limits  CBC WITH DIFFERENTIAL/PLATELET - Abnormal; Notable for the following:    RBC 4.26 (*)    Hemoglobin 11.3 (*)    HCT 35.0 (*)    RDW 19.3 (*)    All other components within normal limits  LACTIC ACID, PLASMA  LIPASE, BLOOD  LACTIC ACID, PLASMA  URINALYSIS COMPLETEWITH MICROSCOPIC ([facility] ONLY)   ____________________________________________  EKG  EKG read and interpreted by me shows normal sinus rhythm at a rate of 75 normal axis occasional PVCs nonspecific ST-T wave changes. These changes are new from an EKG we have from September 1 of this  year ____________________________________________  RADIOLOGY Chest x-ray read as no acute disease hyperventilation present Ultrasound shows sludge and 1 gallstone in the gallbladder and will thickening of the wall no sonographic Murphy sign no reason to explain the patient's elevated liver functions ____________________________________________   PROCEDURES    ____________________________________________   INITIAL IMPRESSION / ASSESSMENT AND PLAN / ED COURSE  Pertinent labs & imaging results that were available during  my care of the patient were reviewed by me and considered in my medical decision making (see chart for details).  Patient denies any raw seafood patient denies any alcohol use and denies any Tylenol use ____________________________________________   FINAL CLINICAL IMPRESSION(S) / ED DIAGNOSES  Final diagnoses:  Elevated troponin  Elevated liver function tests      Nena Polio, MD 12/06/15 1549

## 2015-12-06 NOTE — ED Notes (Signed)
Pt sitting on a stool eating breakfast, fell backwards, witnessed by patrons at [employer].  Pt doesn't remember events of fall.  Remembers everything before and after fall.  Pt doesn't complain of anything hurting him.  Advises he hasn't felt well for a few days and hasn't had much of an appetite.

## 2015-12-07 ENCOUNTER — Inpatient Hospital Stay: Payer: Medicare Other

## 2015-12-07 DIAGNOSIS — R945 Abnormal results of liver function studies: Secondary | ICD-10-CM

## 2015-12-07 DIAGNOSIS — R7989 Other specified abnormal findings of blood chemistry: Secondary | ICD-10-CM

## 2015-12-07 LAB — CBC
HCT: 34.7 % — ABNORMAL LOW (ref 40.0–52.0)
Hemoglobin: 11.4 g/dL — ABNORMAL LOW (ref 13.0–18.0)
MCH: 27.1 pg (ref 26.0–34.0)
MCHC: 33 g/dL (ref 32.0–36.0)
MCV: 82.4 fL (ref 80.0–100.0)
PLATELETS: 221 10*3/uL (ref 150–440)
RBC: 4.21 MIL/uL — ABNORMAL LOW (ref 4.40–5.90)
RDW: 19.1 % — AB (ref 11.5–14.5)
WBC: 6.4 10*3/uL (ref 3.8–10.6)

## 2015-12-07 LAB — COMPREHENSIVE METABOLIC PANEL
ALBUMIN: 3 g/dL — AB (ref 3.5–5.0)
ALT: 1188 U/L — ABNORMAL HIGH (ref 17–63)
AST: 1013 U/L — AB (ref 15–41)
Alkaline Phosphatase: 306 U/L — ABNORMAL HIGH (ref 38–126)
Anion gap: 5 (ref 5–15)
BUN: 27 mg/dL — AB (ref 6–20)
CHLORIDE: 107 mmol/L (ref 101–111)
CO2: 22 mmol/L (ref 22–32)
Calcium: 8.6 mg/dL — ABNORMAL LOW (ref 8.9–10.3)
Creatinine, Ser: 1.4 mg/dL — ABNORMAL HIGH (ref 0.61–1.24)
GFR calc Af Amer: 51 mL/min — ABNORMAL LOW (ref >60)
GFR, EST NON AFRICAN AMERICAN: 44 mL/min — AB (ref >60)
Glucose, Bld: 99 mg/dL (ref 65–99)
POTASSIUM: 4.6 mmol/L (ref 3.5–5.1)
Sodium: 134 mmol/L — ABNORMAL LOW (ref 135–145)
Total Bilirubin: 1.5 mg/dL — ABNORMAL HIGH (ref 0.3–1.2)
Total Protein: 6.5 g/dL (ref 6.5–8.1)

## 2015-12-07 LAB — TROPONIN I
TROPONIN I: 0.07 ng/mL — AB (ref <0.031)
Troponin I: 0.03 ng/mL (ref <0.031)

## 2015-12-07 LAB — BILIRUBIN, DIRECT: Bilirubin, Direct: 0.8 mg/dL — ABNORMAL HIGH (ref 0.1–0.5)

## 2015-12-07 MED ORDER — ASPIRIN EC 81 MG PO TBEC
81.0000 mg | DELAYED_RELEASE_TABLET | Freq: Every day | ORAL | Status: DC
  Administered 2015-12-07 – 2015-12-09 (×3): 81 mg via ORAL
  Filled 2015-12-07 (×3): qty 1

## 2015-12-07 MED ORDER — HYDRALAZINE HCL 20 MG/ML IJ SOLN
10.0000 mg | Freq: Four times a day (QID) | INTRAMUSCULAR | Status: DC | PRN
  Filled 2015-12-07: qty 1

## 2015-12-07 MED ORDER — CIPROFLOXACIN HCL 250 MG PO TABS
250.0000 mg | ORAL_TABLET | Freq: Two times a day (BID) | ORAL | Status: DC
  Administered 2015-12-07 – 2015-12-09 (×5): 250 mg via ORAL
  Filled 2015-12-07 (×5): qty 1

## 2015-12-07 NOTE — Consult Note (Signed)
GI Inpatient Consult Note  Reason for Consult: abnormal LFTS    Attending Requesting Consult: Dr. Bridgett Larsson   History of Present Illness: Jon Smith is a 81 y.o. male seen for evaluation of abnormal LFTS at the request of Dr. Bridgett Larsson. He is a new patient to [facility].   He was admitted on 12/06/15 after having a syncopal episode at restaurant. On admission found to have elevated LFTs (ALT 1198, AST 936, Tbili 1.0, AP 352). Normal albumin, platelets, and WBC. INR 1.47.   His son is at bedside at helps with history. He reports a poor appetite and feeling "sluggish" for about 2 weeks prior to admission but fatigue, weakness was particularly worse over past 3 days. Denies any specific complaints of abdominal pain, nausea, vomiting but has extremely poor appetite. Eating one meal a day. No post prandial abd pain, GERD, dysphagia. No history of similar symptoms. He is having a BM qd-qod without melena or hematochezia. No lower abdominal pain. Weight stable-tells me its down #3lbs this week.   He denies any recent herbal supplement use. Per chart review was on bactrim for UTI in 09/2015. Only other new medication is Flomax which he started 6 weeks ago. He smokes 1/2 PPD. No alcohol use. No high risk behaviors. No family history of liver disease.    Last Colonoscopy: None prior   Last Endoscopy: None prior   Past Medical History:  Past Medical History  Diagnosis Date  . Peripheral arterial disease ([place])   . Hypertension   . Lower extremity ulceration ([place])   . Back pain     Problem List: Patient Active Problem List   Diagnosis Date Noted  . Abnormal liver function tests 12/06/2015  . H/O malignant neoplasm of prostate 09/18/2015  . Hypercholesteremia 05/17/2014  . BP (high blood pressure) 05/17/2014  . Photodermatitis due to sun 05/17/2014    Past Surgical History: Past Surgical History  Procedure Laterality Date  . Peritoneal catheter insertion    . Transluminal angioplasty     . Aortogram    . Prostate surgery      Allergies: Allergies  Allergen Reactions  . Aspirin Nausea And Vomiting    Home Medications: Prescriptions prior to admission  Medication Sig Dispense Refill Last Dose  . amLODipine (NORVASC) 5 MG tablet Take by mouth.   Taking  . aspirin EC 81 MG tablet Take by mouth.   Taking  . latanoprost (XALATAN) 0.005 % ophthalmic solution    Taking  . lisinopril (PRINIVIL,ZESTRIL) 20 MG tablet    Taking  . sulfamethoxazole-trimethoprim (BACTRIM DS,SEPTRA DS) 800-160 MG tablet Take 1 tablet by mouth 2 (two) times daily. 14 tablet 0   . tamsulosin (FLOMAX) 0.4 MG CAPS capsule Take 1 capsule (0.4 mg total) by mouth daily. At bedtime 90 capsule 3    Home medication reconciliation was completed with the patient.   Scheduled Inpatient Medications:   . amLODipine  2.5 mg Oral Daily  . clopidogrel  75 mg Oral Daily  . heparin  5,000 Units Subcutaneous 3 times per day  . nicotine  14 mg Transdermal Daily    Continuous Inpatient Infusions:   . sodium chloride 75 mL/hr at 12/07/15 0341    PRN Inpatient Medications:  acetaminophen **OR** acetaminophen, albuterol, ondansetron **OR** ondansetron (ZOFRAN) IV  Family History: family history includes Heart disease in his brother; Kidney Stones in his sister.  The patient's family history is negative for inflammatory bowel disorders, GI malignancy, or solid organ transplantation.  Social  History:   reports that he has been smoking Cigarettes.  He does not have any smokeless tobacco history on file. He reports that he does not drink alcohol. The patient denies ETOH, tobacco, or drug use.   Review of Systems: Constitutional: Weight is stable. +fatigue.  Eyes: No changes in vision. ENT: No oral lesions, sore throat.  GI: see HPI.  Heme/Lymph: No easy bruising.  CV: No chest pain.  GU: No hematuria.  Integumentary: No rashes.  Neuro: No headaches.  Psych: No depression/anxiety.  Endocrine: No heat/cold  intolerance.  Allergic/Immunologic: No urticaria.  Resp: No cough, SOB.  Musculoskeletal: No joint swelling.    Physical Examination: BP 156/71 mmHg  Pulse 72  Temp(Src) 97.7 F (36.5 C) (Oral)  Resp 18  Ht _0  (1.753 m)  Wt 142 lb (64.411 kg)  BMI 20.96 kg/m2  SpO2 100% Gen: NAD, alert and oriented x 4 HEENT: PEERLA, EOMI, Neck: supple, no JVD or thyromegaly Chest: CTA bilaterally, no wheezes, crackles, or other adventitious sounds CV: RRR, no m/g/c/r Abd: soft, NT, ND, +BS in all four quadrants; no HSM, guarding, ridigity, or rebound tenderness Ext: no edema, well perfused with 2+ pulses, Skin: no rash or lesions noted Lymph: no LAD  Data: Lab Results  Component Value Date   WBC 6.4 12/07/2015   HGB 11.4* 12/07/2015   HCT 34.7* 12/07/2015   MCV 82.4 12/07/2015   PLT 221 12/07/2015    Recent Labs Lab 12/06/15 1238 12/07/15 0549  HGB 11.3* 11.4*   Lab Results  Component Value Date   NA 134* 12/07/2015   K 4.6 12/07/2015   CL 107 12/07/2015   CO2 22 12/07/2015   BUN 27* 12/07/2015   CREATININE 1.40* 12/07/2015   Lab Results  Component Value Date   ALT 1188* 12/07/2015   AST 1013* 12/07/2015   ALKPHOS 306* 12/07/2015   BILITOT 1.5* 12/07/2015    Recent Labs Lab 12/06/15 1654  APTT 33  INR 1.47   Hepatic Function Latest Ref Rng 12/07/2015 12/06/2015  Total Protein 6.5 - 8.1 g/dL 6.5 7.1  Albumin 3.5 - 5.0 g/dL 3.0(L) 3.4(L)  AST 15 - 41 U/L 1013(H) 936(H)  ALT 17 - 63 U/L 1188(H) 1198(H)  Alk Phosphatase 38 - 126 U/L 306(H) 352(H)  Total Bilirubin 0.3 - 1.2 mg/dL 1.5(H) 1.0     RUQ US Impression 12/06/15:  1. Layering sludge and small gallstone is noted within gallbladder measures 5.8 mm. Borderline thickening of gallbladder wall without sonographic Murphy's sign. Normal CBD.   Assessment/Plan: Jon Smith is a 81 y.o. male admitted after episode of syncope found to have remarkably elevated transaminases without known history of liver  disease.   1. Abnormal LFTS - consider DILI from course of Bactrim 6 weeks ago vs. ischemic hepatitis caused by pre-syncopal episode. Viral hepatitis also in differential but less likely. Would recommend a cardiac consult w/ possible Holter and ECHO. INR elevated, will need to trend daily w/ LFTS to evaluate ongoing liver damage. Unable to see if direct vs. Indirect bili elevated-STAT direct bili added on to morning labs. MELD 11. Consider bx if LFTs worsen.   2. Anorexia - x 2 weeks, more consistent w/ DILI as cause of liver injury.   3. AKI - improving, may be pre-renal due to poor PO intake, could also be drug related nephropathy due to ABX. Last BMP through care everywhere w/ Cr 1.4.   Recommendations:  1. Follow viral hepatitis panel & direct bili 2. Check INR  and LFTS daily 3. Continue hydration and supportive care.  4. Consider cardiology/nephrology consults.   Case discussed w/ Dr. Gustavo Lah.    Thank you for the consult. Please call with questions or concerns.  Ronney Asters, PA-C [place]

## 2015-12-07 NOTE — Care Management Obs Status (Signed)
[place] NOTIFICATION   Patient Details  Name: Jon Smith MRN: [id number] Date of Birth: October 07, 1928   Medicare Observation Status Notification Given:  Yes Code 33 letter given by Theodoro Kalata RNCM    Beverly Sessions, RN 12/07/2015, 5:21 PM

## 2015-12-07 NOTE — Progress Notes (Signed)
12/07/2015 14:15  Nurse tech notified me that pt's blood pressure was elevated at 187/84 while lying down.  BP was 169/69 sitting, and 190/71 when standing.  Pt has no PRN BP meds to administer.  Paged Dr. Verdell Carmine as ordered for systolic BP over 0000000 but have not been able to reach him.  Will continue to try to reach the MD for further instructions. Dola Argyle, RN

## 2015-12-07 NOTE — Consult Note (Signed)
Surgical Consultation  12/07/2015  Jon Smith is an 81 y.o. male.   CC: Poor appetite  HPI: This patient is chief complaint is poor appetite he describes absolutely no abdominal pain. He is unable to eat because he has a poor appetite not because he has pain or nausea he takes a bite and does not feel like eating. Did pass out the other day necessitating a trip to the emergency room which is been attributed to dehydration. Patient denies jaundice or acholic stools and no fevers or chills  Past Medical History  Diagnosis Date  . Peripheral arterial disease ([place])   . Hypertension   . Lower extremity ulceration ([place])   . Back pain     Past Surgical History  Procedure Laterality Date  . Peritoneal catheter insertion    . Transluminal angioplasty    . Aortogram    . Prostate surgery      Family History  Problem Relation Age of Onset  . Heart disease Brother   . Kidney Stones Sister     Social History:  reports that he has been smoking Cigarettes.  He does not have any smokeless tobacco history on file. He reports that he does not drink alcohol. His drug history is not on file.  Allergies:  Allergies  Allergen Reactions  . Aspirin Nausea And Vomiting    Medications reviewed.   Review of Systems:   Review of Systems  Constitutional: Negative for fever and chills.  HENT: Negative.   Eyes: Negative.   Respiratory: Negative.   Cardiovascular: Negative.   Gastrointestinal: Negative for heartburn, nausea, vomiting, abdominal pain, diarrhea, constipation, blood in stool and melena.  Genitourinary: Negative.   Musculoskeletal: Negative.   Skin: Negative.   Neurological: Negative.   Endo/Heme/Allergies: Negative.   Psychiatric/Behavioral: Negative.      Physical Exam:  BP 187/84 mmHg  Pulse 80  Temp(Src) 98.2 F (36.8 C) (Oral)  Resp 17  Ht $R'5\' 9"'cA$  (1.753 m)  Wt 142 lb (64.411 kg)  BMI 20.96 kg/m2  SpO2 100%  Physical Exam  Constitutional: He is oriented to  person, place, and time. No distress.  Thin patient with poor dentition sitting up and eating soup with crackers without difficulty.  HENT:  Head: Normocephalic and atraumatic.  Eyes: Pupils are equal, round, and reactive to light. Right eye exhibits no discharge. Left eye exhibits no discharge. No scleral icterus.  Neck: Normal range of motion.  Cardiovascular: Normal rate, regular rhythm and normal heart sounds.   Pulmonary/Chest: Effort normal and breath sounds normal. No respiratory distress. He has no wheezes. He has no rales.  Abdominal: Soft. He exhibits no distension. There is no tenderness. There is no rebound and no guarding.  Musculoskeletal: Normal range of motion. He exhibits no edema.  Lymphadenopathy:    He has no cervical adenopathy.  Neurological: He is alert and oriented to person, place, and time.  Skin: Skin is warm. No rash noted. He is not diaphoretic. No erythema.  Psychiatric: Affect normal.  Vitals reviewed.     Results for orders placed or performed during the hospital encounter of 12/06/15 (from the past 48 hour(s))  Lactic acid, plasma     Status: None   Collection Time: 12/06/15 12:36 PM  Result Value Ref Range   Lactic Acid, Venous 2.0 0.5 - 2.0 mmol/L  Comprehensive metabolic panel     Status: Abnormal   Collection Time: 12/06/15 12:38 PM  Result Value Ref Range   Sodium 134 (L) 135 -  145 mmol/L   Potassium 4.4 3.5 - 5.1 mmol/L   Chloride 107 101 - 111 mmol/L   CO2 21 (L) 22 - 32 mmol/L   Glucose, Bld 122 (H) 65 - 99 mg/dL   BUN 34 (H) 6 - 20 mg/dL   Creatinine, Ser 1.60 (H) 0.61 - 1.24 mg/dL   Calcium 9.3 8.9 - 10.3 mg/dL   Total Protein 7.1 6.5 - 8.1 g/dL   Albumin 3.4 (L) 3.5 - 5.0 g/dL   AST 936 (H) 15 - 41 U/L   ALT 1198 (H) 17 - 63 U/L   Alkaline Phosphatase 352 (H) 38 - 126 U/L   Total Bilirubin 1.0 0.3 - 1.2 mg/dL   GFR calc non Af Amer 38 (L) >60 mL/min   GFR calc Af Amer 44 (L) >60 mL/min    Comment: (NOTE) The eGFR has been  calculated using the CKD EPI equation. This calculation has not been validated in all clinical situations. eGFR's persistently <60 mL/min signify possible Chronic Kidney Disease.    Anion gap 6 5 - 15  Troponin I     Status: Abnormal   Collection Time: 12/06/15 12:38 PM  Result Value Ref Range   Troponin I 0.04 (H) <0.031 ng/mL    Comment: READ BACK AND VERIFIED WITH ASHLEY RILEY ON 12/06/15 AT 1400 [place]        PERSISTENTLY INCREASED TROPONIN VALUES IN THE RANGE OF 0.04-0.49 ng/mL CAN BE SEEN IN:       -UNSTABLE ANGINA       -CONGESTIVE HEART FAILURE       -MYOCARDITIS       -CHEST TRAUMA       -ARRYHTHMIAS       -LATE PRESENTING MYOCARDIAL INFARCTION       -COPD   CLINICAL FOLLOW-UP RECOMMENDED.   CBC with Differential     Status: Abnormal   Collection Time: 12/06/15 12:38 PM  Result Value Ref Range   WBC 5.0 3.8 - 10.6 K/uL   RBC 4.26 (L) 4.40 - 5.90 MIL/uL   Hemoglobin 11.3 (L) 13.0 - 18.0 g/dL   HCT 35.0 (L) 40.0 - 52.0 %   MCV 82.2 80.0 - 100.0 fL   MCH 26.5 26.0 - 34.0 pg   MCHC 32.2 32.0 - 36.0 g/dL   RDW 19.3 (H) 11.5 - 14.5 %   Platelets 236 150 - 440 K/uL   Neutrophils Relative % 66 %   Neutro Abs 3.3 1.4 - 6.5 K/uL   Lymphocytes Relative 19 %   Lymphs Abs 1.0 1.0 - 3.6 K/uL   Monocytes Relative 12 %   Monocytes Absolute 0.6 0.2 - 1.0 K/uL   Eosinophils Relative 1 %   Eosinophils Absolute 0.0 0 - 0.7 K/uL   Basophils Relative 2 %   Basophils Absolute 0.1 0 - 0.1 K/uL  Lipase, blood     Status: None   Collection Time: 12/06/15 12:38 PM  Result Value Ref Range   Lipase 50 11 - 51 U/L  Lactic acid, plasma     Status: Abnormal   Collection Time: 12/06/15  4:54 PM  Result Value Ref Range   Lactic Acid, Venous 2.1 (HH) 0.5 - 2.0 mmol/L    Comment: CRITICAL RESULT CALLED TO, READ BACK BY AND VERIFIED WITH  ANNABELLA ROBINSON AT 1758 12/06/15 SDR   APTT     Status: None   Collection Time: 12/06/15  4:54 PM  Result Value Ref Range   aPTT 33 24 - 36 seconds  Protime-INR     Status: Abnormal   Collection Time: 12/06/15  4:54 PM  Result Value Ref Range   Prothrombin Time 17.9 (H) 11.4 - 15.0 seconds   INR 1.47   Troponin I (q 6hr x 3)     Status: None   Collection Time: 12/06/15  4:54 PM  Result Value Ref Range   Troponin I 0.03 <0.031 ng/mL    Comment:        NO INDICATION OF MYOCARDIAL INJURY.   Urinalysis complete, with microscopic     Status: Abnormal   Collection Time: 12/06/15  9:15 PM  Result Value Ref Range   Color, Urine AMBER (A) YELLOW   APPearance CLOUDY (A) CLEAR   Glucose, UA NEGATIVE NEGATIVE mg/dL   Bilirubin Urine NEGATIVE NEGATIVE   Ketones, ur NEGATIVE NEGATIVE mg/dL   Specific Gravity, Urine 1.019 1.005 - 1.030   Hgb urine dipstick 1+ (A) NEGATIVE   pH 5.0 5.0 - 8.0   Protein, ur NEGATIVE NEGATIVE mg/dL   Nitrite NEGATIVE NEGATIVE   Leukocytes, UA 3+ (A) NEGATIVE   RBC / HPF 6-30 0 - 5 RBC/hpf   WBC, UA TOO NUMEROUS TO COUNT 0 - 5 WBC/hpf   Bacteria, UA MANY (A) NONE SEEN   Squamous Epithelial / LPF NONE SEEN NONE SEEN   WBC Clumps PRESENT    Mucous PRESENT    Hyaline Casts, UA PRESENT   Troponin I     Status: None   Collection Time: 12/06/15 11:11 PM  Result Value Ref Range   Troponin I 0.03 <0.031 ng/mL    Comment:        NO INDICATION OF MYOCARDIAL INJURY.   CBC     Status: Abnormal   Collection Time: 12/07/15  5:49 AM  Result Value Ref Range   WBC 6.4 3.8 - 10.6 K/uL   RBC 4.21 (L) 4.40 - 5.90 MIL/uL   Hemoglobin 11.4 (L) 13.0 - 18.0 g/dL   HCT 34.7 (L) 40.0 - 52.0 %   MCV 82.4 80.0 - 100.0 fL   MCH 27.1 26.0 - 34.0 pg   MCHC 33.0 32.0 - 36.0 g/dL   RDW 19.1 (H) 11.5 - 14.5 %   Platelets 221 150 - 440 K/uL  Comprehensive metabolic panel     Status: Abnormal   Collection Time: 12/07/15  5:49 AM  Result Value Ref Range   Sodium 134 (L) 135 - 145 mmol/L   Potassium 4.6 3.5 - 5.1 mmol/L   Chloride 107 101 - 111 mmol/L   CO2 22 22 - 32 mmol/L   Glucose, Bld 99 65 - 99 mg/dL   BUN 27 (H) 6 -  20 mg/dL   Creatinine, Ser 1.40 (H) 0.61 - 1.24 mg/dL   Calcium 8.6 (L) 8.9 - 10.3 mg/dL   Total Protein 6.5 6.5 - 8.1 g/dL   Albumin 3.0 (L) 3.5 - 5.0 g/dL   AST 1013 (H) 15 - 41 U/L   ALT 1188 (H) 17 - 63 U/L   Alkaline Phosphatase 306 (H) 38 - 126 U/L   Total Bilirubin 1.5 (H) 0.3 - 1.2 mg/dL   GFR calc non Af Amer 44 (L) >60 mL/min   GFR calc Af Amer 51 (L) >60 mL/min    Comment: (NOTE) The eGFR has been calculated using the CKD EPI equation. This calculation has not been validated in all clinical situations. eGFR's persistently <60 mL/min signify possible Chronic Kidney Disease.    Anion gap 5 5 - 15  Troponin I     Status: Abnormal   Collection Time: 12/07/15  5:49 AM  Result Value Ref Range   Troponin I 0.07 (H) <0.031 ng/mL    Comment: PREVIOUS RESULT CALLED TO ASHLEY RILEY AT 1400 ON 12/06/15 BY [place].Marland KitchenMarland KitchenSturgis        PERSISTENTLY INCREASED TROPONIN VALUES IN THE RANGE OF 0.04-0.49 ng/mL CAN BE SEEN IN:       -UNSTABLE ANGINA       -CONGESTIVE HEART FAILURE       -MYOCARDITIS       -CHEST TRAUMA       -ARRYHTHMIAS       -LATE PRESENTING MYOCARDIAL INFARCTION       -COPD   CLINICAL FOLLOW-UP RECOMMENDED.    Dg Chest 2 View  12/06/2015  CLINICAL DATA:  [place] from stool.  Syncope. EXAM: CHEST  2 VIEW COMPARISON:  08/31/2015 FINDINGS: Mild hyperinflation of the lungs. Heart and mediastinal contours are within normal limits. No focal opacities or effusions. No acute bony abnormality. IMPRESSION: Hyperinflation.  No active cardiopulmonary disease. Electronically Signed   By: Rolm Baptise M.D.   On: 12/06/2015 13:02   Ct Head Wo Contrast  12/06/2015  CLINICAL DATA:  81 year old male who fell backwards at rest Ron with syncope. Initial encounter. EXAM: CT HEAD WITHOUT CONTRAST TECHNIQUE: Contiguous axial images were obtained from the base of the skull through the vertex without intravenous contrast. COMPARISON:  None. FINDINGS: No scalp hematoma identified. Visualized paranasal  sinuses and mastoids are clear. Calvarium intact. No acute osseous abnormality identified. No acute orbit findings, postoperative changes to the right globe. Calcified atherosclerosis at the skull base. Dural calcifications. Generalized cerebral volume loss congruent with age. No ventriculomegaly. No midline shift, mass effect, or evidence of intracranial mass lesion. No acute intracranial hemorrhage identified. Patchy and confluent cerebral white matter hypodensity. Heterogeneous density also suggested in the pons (series 2, image 9). Small focus of hypodensity along the lateral left caudate nucleus. No cortically based acute infarct identified. No suspicious intracranial vascular hyperdensity. IMPRESSION: 1. No acute intracranial abnormality. No acute traumatic injury identified. 2. Hypodense changes in the white matter and pons suggestive of chronic small vessel disease. Electronically Signed   By: Genevie Ann M.D.   On: 12/06/2015 12:47   US Carotid Bilateral  12/07/2015  CLINICAL DATA:  Syncopal episodes. EXAM: BILATERAL CAROTID DUPLEX ULTRASOUND TECHNIQUE: Pearline Cables scale imaging, color Doppler and duplex ultrasound were performed of bilateral carotid and vertebral arteries in the neck. COMPARISON:  Ultrasound of August 13, 2012. FINDINGS: Criteria: Quantification of carotid stenosis is based on velocity parameters that correlate the residual internal carotid diameter with NASCET-based stenosis levels, using the diameter of the distal internal carotid lumen as the denominator for stenosis measurement. The following velocity measurements were obtained: RIGHT ICA:  51/13 cm/sec CCA:  68/61 cm/sec SYSTOLIC ICA/CCA RATIO:  0.6 DIASTOLIC ICA/CCA RATIO:  0.9 ECA:  69 cm/sec LEFT ICA:  60/14 cm/sec CCA:  68/37 cm/sec SYSTOLIC ICA/CCA RATIO:  0.7 DIASTOLIC ICA/CCA RATIO:  1.1 ECA:  98 cm/sec RIGHT CAROTID ARTERY: Mild smooth plaque formation is noted in the proximal right internal carotid artery consistent with less than  50% diameter stenosis based on ultrasound and Doppler criteria. RIGHT VERTEBRAL ARTERY:  Antegrade flow is noted. LEFT CAROTID ARTERY: Mild irregular calcified plaque formation is noted in the proximal left internal carotid artery consistent with less than 50% diameter stenosis based on ultrasound and Doppler criteria. LEFT VERTEBRAL ARTERY:  Antegrade flow is noted. IMPRESSION:  Mild plaque formation is noted in both proximal internal carotid arteries consistent with less than 50% diameter stenosis based on ultrasound and Doppler criteria. Electronically Signed   By: Marijo Conception, M.D.   On: 12/07/2015 10:51   Dg Chest Portable 1 View  12/06/2015  CLINICAL DATA:  Syncope EXAM: PORTABLE CHEST 1 VIEW COMPARISON:  12/06/2015 FINDINGS: Cardiomediastinal silhouette is stable. Hyperinflation again noted. No acute infiltrate or pleural effusion. No pulmonary edema. Degenerative changes bilateral acromioclavicular joints. IMPRESSION: No active disease.  Hyperinflation again noted. Electronically Signed   By: Lahoma Crocker M.D.   On: 12/06/2015 13:55   US Abdomen Limited Ruq  12/06/2015  ADDENDUM REPORT: 12/06/2015 15:46 ADDENDUM: There is trace pericholecystic fluid. Clinical correlation is necessary to exclude early cholecystitis. Electronically Signed   By: Lahoma Crocker M.D.   On: 12/06/2015 15:46  12/06/2015  CLINICAL DATA:  Elevated LFTs EXAM: US ABDOMEN LIMITED - RIGHT UPPER QUADRANT COMPARISON:  None. FINDINGS: Gallbladder: Layering debris is noted within gallbladder. Small gallstone measures 5.8 mm. Borderline thickening of gallbladder wall up to 3.3 mm. No sonographic Murphy's sign. Common bile duct: Diameter: 6.8 mm in diameter within normal limits. Liver: No focal lesion identified. Within normal limits in parenchymal echogenicity. IMPRESSION: 1. Layering sludge and small gallstone is noted within gallbladder measures 5.8 mm. Borderline thickening of gallbladder wall without sonographic Murphy's sign. Normal  CBD. Electronically Signed: By: Lahoma Crocker M.D. On: 12/06/2015 15:14    Assessment/Plan:  This patient is chief complaint is poor appetite he has no abdominal pain has no fatty food intolerance and no signs to suggest acute cholecystitis or even biliary colic. He does have gallstones and elevated liver function tests but because of his triple episode and the high elevation of AST ALT this suggest possibly dehydration and and shock liver as opposed to choledocholithiasis. Will follow but no acute surgical plans at this time  Florene Glen, MD, FACS

## 2015-12-07 NOTE — Progress Notes (Signed)
[facility] [place] NAME: Jon Smith    MR#:  [id number]  DATE OF BIRTH:  February 14, 1928  SUBJECTIVE:  CHIEF COMPLAINT:   Chief Complaint  Patient presents with  . Fall   Patient here with fall and noted to have abnormal LFTs. Liver function about the same. Overall feels better. No abdominal pain, nausea, vomiting. Son at bedside.   REVIEW OF SYSTEMS:    Review of Systems  Constitutional: Negative for fever and chills.  HENT: Negative for congestion and tinnitus.   Eyes: Negative for blurred vision and double vision.  Respiratory: Negative for cough, shortness of breath and wheezing.   Cardiovascular: Negative for chest pain, orthopnea and PND.  Gastrointestinal: Negative for nausea, vomiting, abdominal pain and diarrhea.  Genitourinary: Negative for dysuria and hematuria.  Neurological: Positive for weakness (generalized). Negative for dizziness, sensory change and focal weakness.  All other systems reviewed and are negative.   Nutrition: heart healthy Tolerating Diet: Yes Tolerating PT: Await Eval.    DRUG ALLERGIES:   Allergies  Allergen Reactions  . Aspirin Nausea And Vomiting    VITALS:  Blood pressure 190/71, pulse 75, temperature 98.2 F (36.8 C), temperature source Oral, resp. rate 17, height 5\' 9"  (1.753 m), weight 64.411 kg (142 lb), SpO2 99 %.  PHYSICAL EXAMINATION:   Physical Exam  GENERAL:  81 y.o.-year-old patient lying in the bed with no acute distress.  EYES: Pupils equal, round, reactive to light and accommodation. No scleral icterus. Extraocular muscles intact.  HEENT: Head atraumatic, normocephalic. Oropharynx and nasopharynx clear.  NECK:  Supple, no jugular venous distention. No thyroid enlargement, no tenderness.  LUNGS: Normal breath sounds bilaterally, no wheezing, rales, rhonchi. No use of accessory muscles of respiration.  CARDIOVASCULAR: S1, S2 normal. No murmurs, rubs, or gallops.   ABDOMEN: Soft, nontender, nondistended. Bowel sounds present. No organomegaly or mass.  EXTREMITIES: No cyanosis, clubbing or edema b/l.    NEUROLOGIC: Cranial nerves II through XII are intact. No focal Motor or sensory deficits b/l.   PSYCHIATRIC: The patient is alert and oriented x 3. Good affect.  SKIN: No obvious rash, lesion, or ulcer.    LABORATORY PANEL:   CBC  Recent Labs Lab 12/07/15 0549  WBC 6.4  HGB 11.4*  HCT 34.7*  PLT 221   ------------------------------------------------------------------------------------------------------------------  Chemistries   Recent Labs Lab 12/07/15 0549  NA 134*  K 4.6  CL 107  CO2 22  GLUCOSE 99  BUN 27*  CREATININE 1.40*  CALCIUM 8.6*  AST 1013*  ALT 1188*  ALKPHOS 306*  BILITOT 1.5*   ------------------------------------------------------------------------------------------------------------------  Cardiac Enzymes  Recent Labs Lab 12/07/15 0549  TROPONINI 0.07*   ------------------------------------------------------------------------------------------------------------------  RADIOLOGY:  Dg Chest 2 View  12/06/2015  CLINICAL DATA:  [place] from stool.  Syncope. EXAM: CHEST  2 VIEW COMPARISON:  08/31/2015 FINDINGS: Mild hyperinflation of the lungs. Heart and mediastinal contours are within normal limits. No focal opacities or effusions. No acute bony abnormality. IMPRESSION: Hyperinflation.  No active cardiopulmonary disease. Electronically Signed   By: Rolm Baptise M.D.   On: 12/06/2015 13:02   Ct Head Wo Contrast  12/06/2015  CLINICAL DATA:  81 year old male who fell backwards at rest Ron with syncope. Initial encounter. EXAM: CT HEAD WITHOUT CONTRAST TECHNIQUE: Contiguous axial images were obtained from the base of the skull through the vertex without intravenous contrast. COMPARISON:  None. FINDINGS: No scalp hematoma identified. Visualized paranasal sinuses and mastoids are clear. Calvarium intact.  No acute  osseous abnormality identified. No acute orbit findings, postoperative changes to the right globe. Calcified atherosclerosis at the skull base. Dural calcifications. Generalized cerebral volume loss congruent with age. No ventriculomegaly. No midline shift, mass effect, or evidence of intracranial mass lesion. No acute intracranial hemorrhage identified. Patchy and confluent cerebral white matter hypodensity. Heterogeneous density also suggested in the pons (series 2, image 9). Small focus of hypodensity along the lateral left caudate nucleus. No cortically based acute infarct identified. No suspicious intracranial vascular hyperdensity. IMPRESSION: 1. No acute intracranial abnormality. No acute traumatic injury identified. 2. Hypodense changes in the white matter and pons suggestive of chronic small vessel disease. Electronically Signed   By: Genevie Ann M.D.   On: 12/06/2015 12:47   US Carotid Bilateral  12/07/2015  CLINICAL DATA:  Syncopal episodes. EXAM: BILATERAL CAROTID DUPLEX ULTRASOUND TECHNIQUE: Pearline Cables scale imaging, color Doppler and duplex ultrasound were performed of bilateral carotid and vertebral arteries in the neck. COMPARISON:  Ultrasound of August 13, 2012. FINDINGS: Criteria: Quantification of carotid stenosis is based on velocity parameters that correlate the residual internal carotid diameter with NASCET-based stenosis levels, using the diameter of the distal internal carotid lumen as the denominator for stenosis measurement. The following velocity measurements were obtained: RIGHT ICA:  51/13 cm/sec CCA:  123456 cm/sec SYSTOLIC ICA/CCA RATIO:  0.6 DIASTOLIC ICA/CCA RATIO:  0.9 ECA:  69 cm/sec LEFT ICA:  60/14 cm/sec CCA:  [id number] cm/sec SYSTOLIC ICA/CCA RATIO:  0.7 DIASTOLIC ICA/CCA RATIO:  1.1 ECA:  98 cm/sec RIGHT CAROTID ARTERY: Mild smooth plaque formation is noted in the proximal right internal carotid artery consistent with less than 50% diameter stenosis based on ultrasound and Doppler  criteria. RIGHT VERTEBRAL ARTERY:  Antegrade flow is noted. LEFT CAROTID ARTERY: Mild irregular calcified plaque formation is noted in the proximal left internal carotid artery consistent with less than 50% diameter stenosis based on ultrasound and Doppler criteria. LEFT VERTEBRAL ARTERY:  Antegrade flow is noted. IMPRESSION: Mild plaque formation is noted in both proximal internal carotid arteries consistent with less than 50% diameter stenosis based on ultrasound and Doppler criteria. Electronically Signed   By: Marijo Conception, M.D.   On: 12/07/2015 10:51   Dg Chest Portable 1 View  12/06/2015  CLINICAL DATA:  Syncope EXAM: PORTABLE CHEST 1 VIEW COMPARISON:  12/06/2015 FINDINGS: Cardiomediastinal silhouette is stable. Hyperinflation again noted. No acute infiltrate or pleural effusion. No pulmonary edema. Degenerative changes bilateral acromioclavicular joints. IMPRESSION: No active disease.  Hyperinflation again noted. Electronically Signed   By: Lahoma Crocker M.D.   On: 12/06/2015 13:55   US Abdomen Limited Ruq  12/06/2015  ADDENDUM REPORT: 12/06/2015 15:46 ADDENDUM: There is trace pericholecystic fluid. Clinical correlation is necessary to exclude early cholecystitis. Electronically Signed   By: Lahoma Crocker M.D.   On: 12/06/2015 15:46  12/06/2015  CLINICAL DATA:  Elevated LFTs EXAM: US ABDOMEN LIMITED - RIGHT UPPER QUADRANT COMPARISON:  None. FINDINGS: Gallbladder: Layering debris is noted within gallbladder. Small gallstone measures 5.8 mm. Borderline thickening of gallbladder wall up to 3.3 mm. No sonographic Murphy's sign. Common bile duct: Diameter: 6.8 mm in diameter within normal limits. Liver: No focal lesion identified. Within normal limits in parenchymal echogenicity. IMPRESSION: 1. Layering sludge and small gallstone is noted within gallbladder measures 5.8 mm. Borderline thickening of gallbladder wall without sonographic Murphy's sign. Normal CBD. Electronically Signed: By: Lahoma Crocker M.D. On:  12/06/2015 15:14     ASSESSMENT AND PLAN:   81 year old with past  medical history of hypertension, glaucoma, BPH, recent UTI who presents to the hospital after a fall/syncope and noted to have abnormal LFTs.  #1 fall/syncope-etiology unclear. Could be vasovagal syncope. -CT head on admission negative. Patient's telemetry has been negative for any acute arrhythmia. Patient's carotid duplex, echocardiogram are also essentially normal. -We'll check orthostatic vital signs. Continue IV fluid hydration. -Await physical therapy evaluation.  #2 abnormal LFTs-etiology unclear but could be related to underlying dehydration and hypovolemia. -Abdominal ultrasound shows gallstones with some sludge but no evidence of acute cholecystitis. Seen by general surgery and no plans for acute surgical intervention. -Also seen by gastroenterology and follow hepatitis serologies have been ordered. -cont. IV fluids, we'll follow LFTs. Patient clinically has no abdominal pain or acute symptoms.  #3 hypertension-cont. Norvasc.  - PRN Hydralazine as BP elevated. Hold ACE given mild ARF.   #4 acute renal failure-likely related to dehydration. Continue IV fluids and follow BUN and creatinine.  #5 UTI-continue oral Cipro.  #6 tobacco abuse-continue nicotine patch   All the records are reviewed and case discussed with Care Management/Social Workerr. Management plans discussed with the patient, family and they are in agreement.  CODE STATUS: Full  DVT Prophylaxis: Heparin subcutaneous  TOTAL TIME TAKING CARE OF THIS PATIENT: 30 minutes.   POSSIBLE D/C IN 1-2 DAYS, DEPENDING ON CLINICAL CONDITION.   Henreitta Leber M.D on 12/07/2015 at 3:54 PM  Between 7am to 6pm - Pager - [phone]  After 6pm go to [email or web address] - password [email or web address] [facility]  Office  [phone]  CC: Primary care physician; Annice Needy, MD

## 2015-12-07 NOTE — Consult Note (Signed)
Subjective: Patient seen for abnormal liver tests. Patient seen and examined.  Chart reviewed.  Please see full Consult by Ms Constance Haw.  Patient admitted with syncopal episode.  Found with elevated hepatocellular liver enzymes and some renal insufficiency.  Pateitn states he has had a poor appetite for about a week.    There is some suspicion about possible drug induced liver injury with the recent use of Bactrim, with both the drug constituents being possible offenders.  However the patient could not remember taking that pill though clearly remembers taking the tamulosin.  I talked with the pharmacist at his pharmacy, and although there is a prescription for bactrim on 10/3, he apparently never picked it up.   Objective: Vital signs in last 24 hours: Temp:  [97.7 F (36.5 C)-98.2 F (36.8 C)] 98.2 F (36.8 C) (12/08 1415) Pulse Rate:  [70-80] 75 (12/08 1424) Resp:  [16-18] 17 (12/08 1415) BP: (153-190)/(67-84) 179/84 mmHg (12/08 1657) SpO2:  [99 %-100 %] 99 % (12/08 1424) Blood pressure 179/84, pulse 75, temperature 98.2 F (36.8 C), temperature source Oral, resp. rate 17, height 5\' 9"  (1.753 m), weight 64.411 kg (142 lb), SpO2 99 %.   Intake/Output from previous day: 12/07 0701 - 12/08 0700 In: 1377.8 [P.O.:340; I.V.:1037.8] Out: 650 [Urine:650]  Intake/Output this shift: Total I/O In: 943.8 [P.O.:640; I.V.:303.8] Out: 425 [Urine:425]   General appearance:  Elderly male NAD Resp:  cta Cardio:  rrr GI:  Soft nt/nd, bs positive/n Extremities:     Lab Results: Results for orders placed or performed during the hospital encounter of 12/06/15 (from the past 24 hour(s))  Urinalysis complete, with microscopic     Status: Abnormal   Collection Time: 12/06/15  9:15 PM  Result Value Ref Range   Color, Urine AMBER (A) YELLOW   APPearance CLOUDY (A) CLEAR   Glucose, UA NEGATIVE NEGATIVE mg/dL   Bilirubin Urine NEGATIVE NEGATIVE   Ketones, ur NEGATIVE NEGATIVE mg/dL   Specific  Gravity, Urine 1.019 1.005 - 1.030   Hgb urine dipstick 1+ (A) NEGATIVE   pH 5.0 5.0 - 8.0   Protein, ur NEGATIVE NEGATIVE mg/dL   Nitrite NEGATIVE NEGATIVE   Leukocytes, UA 3+ (A) NEGATIVE   RBC / HPF 6-30 0 - 5 RBC/hpf   WBC, UA TOO NUMEROUS TO COUNT 0 - 5 WBC/hpf   Bacteria, UA MANY (A) NONE SEEN   Squamous Epithelial / LPF NONE SEEN NONE SEEN   WBC Clumps PRESENT    Mucous PRESENT    Hyaline Casts, UA PRESENT   Troponin I     Status: None   Collection Time: 12/06/15 11:11 PM  Result Value Ref Range   Troponin I 0.03 <0.031 ng/mL  CBC     Status: Abnormal   Collection Time: 12/07/15  5:49 AM  Result Value Ref Range   WBC 6.4 3.8 - 10.6 K/uL   RBC 4.21 (L) 4.40 - 5.90 MIL/uL   Hemoglobin 11.4 (L) 13.0 - 18.0 g/dL   HCT 34.7 (L) 40.0 - 52.0 %   MCV 82.4 80.0 - 100.0 fL   MCH 27.1 26.0 - 34.0 pg   MCHC 33.0 32.0 - 36.0 g/dL   RDW 19.1 (H) 11.5 - 14.5 %   Platelets 221 150 - 440 K/uL  Comprehensive metabolic panel     Status: Abnormal   Collection Time: 12/07/15  5:49 AM  Result Value Ref Range   Sodium 134 (L) 135 - 145 mmol/L   Potassium 4.6 3.5 - 5.1 mmol/L  Chloride 107 101 - 111 mmol/L   CO2 22 22 - 32 mmol/L   Glucose, Bld 99 65 - 99 mg/dL   BUN 27 (H) 6 - 20 mg/dL   Creatinine, Ser 1.40 (H) 0.61 - 1.24 mg/dL   Calcium 8.6 (L) 8.9 - 10.3 mg/dL   Total Protein 6.5 6.5 - 8.1 g/dL   Albumin 3.0 (L) 3.5 - 5.0 g/dL   AST 1013 (H) 15 - 41 U/L   ALT 1188 (H) 17 - 63 U/L   Alkaline Phosphatase 306 (H) 38 - 126 U/L   Total Bilirubin 1.5 (H) 0.3 - 1.2 mg/dL   GFR calc non Af Amer 44 (L) >60 mL/min   GFR calc Af Amer 51 (L) >60 mL/min   Anion gap 5 5 - 15  Troponin I     Status: Abnormal   Collection Time: 12/07/15  5:49 AM  Result Value Ref Range   Troponin I 0.07 (H) <0.031 ng/mL      Recent Labs  12/06/15 1238 12/07/15 0549  WBC 5.0 6.4  HGB 11.3* 11.4*  HCT 35.0* 34.7*  PLT 236 221   BMET  Recent Labs  12/06/15 1238 12/07/15 0549  NA 134* 134*  K  4.4 4.6  CL 107 107  CO2 21* 22  GLUCOSE 122* 99  BUN 34* 27*  CREATININE 1.60* 1.40*  CALCIUM 9.3 8.6*   LFT  Recent Labs  12/07/15 0549  PROT 6.5  ALBUMIN 3.0*  AST 1013*  ALT 1188*  ALKPHOS 306*  BILITOT 1.5*   PT/INR  Recent Labs  12/06/15 1654  LABPROT 17.9*  INR 1.47   Hepatitis Panel No results for input(s): HEPBSAG, HCVAB, HEPAIGM, HEPBIGM in the last 72 hours. C-Diff No results for input(s): CDIFFTOX in the last 72 hours. No results for input(s): CDIFFPCR in the last 72 hours.   Studies/Results: Dg Chest 2 View  12/06/2015  CLINICAL DATA:  [place] from stool.  Syncope. EXAM: CHEST  2 VIEW COMPARISON:  08/31/2015 FINDINGS: Mild hyperinflation of the lungs. Heart and mediastinal contours are within normal limits. No focal opacities or effusions. No acute bony abnormality. IMPRESSION: Hyperinflation.  No active cardiopulmonary disease. Electronically Signed   By: Rolm Baptise M.D.   On: 12/06/2015 13:02   Ct Head Wo Contrast  12/06/2015  CLINICAL DATA:  81 year old male who fell backwards at rest Ron with syncope. Initial encounter. EXAM: CT HEAD WITHOUT CONTRAST TECHNIQUE: Contiguous axial images were obtained from the base of the skull through the vertex without intravenous contrast. COMPARISON:  None. FINDINGS: No scalp hematoma identified. Visualized paranasal sinuses and mastoids are clear. Calvarium intact. No acute osseous abnormality identified. No acute orbit findings, postoperative changes to the right globe. Calcified atherosclerosis at the skull base. Dural calcifications. Generalized cerebral volume loss congruent with age. No ventriculomegaly. No midline shift, mass effect, or evidence of intracranial mass lesion. No acute intracranial hemorrhage identified. Patchy and confluent cerebral white matter hypodensity. Heterogeneous density also suggested in the pons (series 2, image 9). Small focus of hypodensity along the lateral left caudate nucleus. No  cortically based acute infarct identified. No suspicious intracranial vascular hyperdensity. IMPRESSION: 1. No acute intracranial abnormality. No acute traumatic injury identified. 2. Hypodense changes in the white matter and pons suggestive of chronic small vessel disease. Electronically Signed   By: Genevie Ann M.D.   On: 12/06/2015 12:47   US Carotid Bilateral  12/07/2015  CLINICAL DATA:  Syncopal episodes. EXAM: BILATERAL CAROTID DUPLEX ULTRASOUND TECHNIQUE: Pearline Cables  scale imaging, color Doppler and duplex ultrasound were performed of bilateral carotid and vertebral arteries in the neck. COMPARISON:  Ultrasound of August 13, 2012. FINDINGS: Criteria: Quantification of carotid stenosis is based on velocity parameters that correlate the residual internal carotid diameter with NASCET-based stenosis levels, using the diameter of the distal internal carotid lumen as the denominator for stenosis measurement. The following velocity measurements were obtained: RIGHT ICA:  51/13 cm/sec CCA:  123456 cm/sec SYSTOLIC ICA/CCA RATIO:  0.6 DIASTOLIC ICA/CCA RATIO:  0.9 ECA:  69 cm/sec LEFT ICA:  60/14 cm/sec CCA:  [id number] cm/sec SYSTOLIC ICA/CCA RATIO:  0.7 DIASTOLIC ICA/CCA RATIO:  1.1 ECA:  98 cm/sec RIGHT CAROTID ARTERY: Mild smooth plaque formation is noted in the proximal right internal carotid artery consistent with less than 50% diameter stenosis based on ultrasound and Doppler criteria. RIGHT VERTEBRAL ARTERY:  Antegrade flow is noted. LEFT CAROTID ARTERY: Mild irregular calcified plaque formation is noted in the proximal left internal carotid artery consistent with less than 50% diameter stenosis based on ultrasound and Doppler criteria. LEFT VERTEBRAL ARTERY:  Antegrade flow is noted. IMPRESSION: Mild plaque formation is noted in both proximal internal carotid arteries consistent with less than 50% diameter stenosis based on ultrasound and Doppler criteria. Electronically Signed   By: Marijo Conception, M.D.   On: 12/07/2015  10:51   Dg Chest Portable 1 View  12/06/2015  CLINICAL DATA:  Syncope EXAM: PORTABLE CHEST 1 VIEW COMPARISON:  12/06/2015 FINDINGS: Cardiomediastinal silhouette is stable. Hyperinflation again noted. No acute infiltrate or pleural effusion. No pulmonary edema. Degenerative changes bilateral acromioclavicular joints. IMPRESSION: No active disease.  Hyperinflation again noted. Electronically Signed   By: Lahoma Crocker M.D.   On: 12/06/2015 13:55   US Abdomen Limited Ruq  12/06/2015  ADDENDUM REPORT: 12/06/2015 15:46 ADDENDUM: There is trace pericholecystic fluid. Clinical correlation is necessary to exclude early cholecystitis. Electronically Signed   By: Lahoma Crocker M.D.   On: 12/06/2015 15:46  12/06/2015  CLINICAL DATA:  Elevated LFTs EXAM: US ABDOMEN LIMITED - RIGHT UPPER QUADRANT COMPARISON:  None. FINDINGS: Gallbladder: Layering debris is noted within gallbladder. Small gallstone measures 5.8 mm. Borderline thickening of gallbladder wall up to 3.3 mm. No sonographic Murphy's sign. Common bile duct: Diameter: 6.8 mm in diameter within normal limits. Liver: No focal lesion identified. Within normal limits in parenchymal echogenicity. IMPRESSION: 1. Layering sludge and small gallstone is noted within gallbladder measures 5.8 mm. Borderline thickening of gallbladder wall without sonographic Murphy's sign. Normal CBD. Electronically Signed: By: Lahoma Crocker M.D. On: 12/06/2015 15:14    Scheduled Inpatient Medications:   . amLODipine  2.5 mg Oral Daily  . aspirin EC  81 mg Oral Daily  . ciprofloxacin  250 mg Oral BID  . heparin  5,000 Units Subcutaneous 3 times per day  . nicotine  14 mg Transdermal Daily    Continuous Inpatient Infusions:   . sodium chloride 100 mL/hr at 12/07/15 1216    PRN Inpatient Medications:  acetaminophen **OR** acetaminophen, albuterol, hydrALAZINE, ondansetron **OR** ondansetron (ZOFRAN) IV  Miscellaneous:   Assessment:  1) abnormal liver tests.  Per history as  recounted of above, much less likely a drug induced reaction, and suspicion again for possible ischemic injury involving the liver and/or kidneys.  There is a remote history of etoh abuse but quit drinking 40 years ago.  Recommend cardiology consult and consideration of possible holter.  Although there is sludge and small gallstones in the gallbladder, there is no cbd dilation  and no history of abdominal pain.  His other meds are less likely to have caused elevated lfts.   Plan:  1) continue daily lfts and PT.  Awaiting hepatitis panel result. Following.   Lollie Sails MD 12/07/2015, 5:26 PM

## 2015-12-08 DIAGNOSIS — B179 Acute viral hepatitis, unspecified: Secondary | ICD-10-CM | POA: Diagnosis present

## 2015-12-08 LAB — COMPREHENSIVE METABOLIC PANEL
ALT: 1561 U/L — AB (ref 17–63)
AST: 1475 U/L — ABNORMAL HIGH (ref 15–41)
Albumin: 2.8 g/dL — ABNORMAL LOW (ref 3.5–5.0)
Alkaline Phosphatase: 307 U/L — ABNORMAL HIGH (ref 38–126)
Anion gap: 5 (ref 5–15)
BUN: 25 mg/dL — ABNORMAL HIGH (ref 6–20)
CALCIUM: 8.7 mg/dL — AB (ref 8.9–10.3)
CHLORIDE: 105 mmol/L (ref 101–111)
CO2: 22 mmol/L (ref 22–32)
CREATININE: 1.24 mg/dL (ref 0.61–1.24)
GFR, EST AFRICAN AMERICAN: 59 mL/min — AB (ref >60)
GFR, EST NON AFRICAN AMERICAN: 51 mL/min — AB (ref >60)
Glucose, Bld: 102 mg/dL — ABNORMAL HIGH (ref 65–99)
Potassium: 4.4 mmol/L (ref 3.5–5.1)
Sodium: 132 mmol/L — ABNORMAL LOW (ref 135–145)
Total Bilirubin: 2.6 mg/dL — ABNORMAL HIGH (ref 0.3–1.2)
Total Protein: 6 g/dL — ABNORMAL LOW (ref 6.5–8.1)

## 2015-12-08 LAB — CBC
HEMATOCRIT: 32.4 % — AB (ref 40.0–52.0)
Hemoglobin: 10.7 g/dL — ABNORMAL LOW (ref 13.0–18.0)
MCH: 26.9 pg (ref 26.0–34.0)
MCHC: 33.1 g/dL (ref 32.0–36.0)
MCV: 81.1 fL (ref 80.0–100.0)
PLATELETS: 197 10*3/uL (ref 150–440)
RBC: 3.99 MIL/uL — AB (ref 4.40–5.90)
RDW: 19.2 % — AB (ref 11.5–14.5)
WBC: 7.6 10*3/uL (ref 3.8–10.6)

## 2015-12-08 LAB — PROTIME-INR
INR: 1.88
Prothrombin Time: 21.5 seconds — ABNORMAL HIGH (ref 11.4–15.0)

## 2015-12-08 LAB — ACETAMINOPHEN LEVEL

## 2015-12-08 LAB — HEPATITIS PANEL, ACUTE
HCV Ab: <0.1 s/co ratio (ref 0.0–0.9)
Hep A IgM: NEGATIVE
Hep B C IgM: NEGATIVE
Hepatitis B Surface Ag: NEGATIVE

## 2015-12-08 NOTE — Progress Notes (Signed)
Dr. Darletta Moll called with new orders to discontinue acetaminophen and order a acetaminophen level on admission blood. Orders discontinued, lab ordered and also called to verify order received.

## 2015-12-08 NOTE — Progress Notes (Signed)
CC: Elevated liver function tests Subjective: Patient continues to complain of poor appetite but he is ordering breakfast right now. He has no nausea vomiting no fevers or chills and absolutely no abdominal pain no right upper quadrant pain. Denies back pain.  Objective: Vital signs in last 24 hours: Temp:  [98 F (36.7 C)-98.4 F (36.9 C)] 98 F (36.7 C) (12/09 0413) Pulse Rate:  [73-88] 73 (12/09 0413) Resp:  [17-18] 18 (12/09 0413) BP: (120-190)/(63-84) 144/69 mmHg (12/09 0413) SpO2:  [99 %-100 %] 100 % (12/09 0413) Last BM Date: 12/06/15  Intake/Output from previous day: 12/08 0701 - 12/09 0700 In: 2709.6 [P.O.:880; I.V.:1829.6] Out: 775 [Urine:775] Intake/Output this shift: Total I/O In: 1273.3 [I.V.:1273.3] Out: 200 [Urine:200]  Physical exam:  Awake alert and oriented comfortable-appearing vital signs are reviewed. Abdomen is soft nondistended nontender negative Murphy sign nontender calves  Lab Results: CBC   Recent Labs  12/07/15 0549 12/08/15 0534  WBC 6.4 7.6  HGB 11.4* 10.7*  HCT 34.7* 32.4*  PLT 221 197   BMET  Recent Labs  12/07/15 0549 12/08/15 0534  NA 134* 132*  K 4.6 4.4  CL 107 105  CO2 22 22  GLUCOSE 99 102*  BUN 27* 25*  CREATININE 1.40* 1.24  CALCIUM 8.6* 8.7*   PT/INR  Recent Labs  12/06/15 1654 12/08/15 0534  LABPROT 17.9* 21.5*  INR 1.47 1.88   ABG No results for input(s): PHART, HCO3 in the last 72 hours.  Invalid input(s): PCO2, PO2  Studies/Results: Dg Chest 2 View  12/06/2015  CLINICAL DATA:  [place] from stool.  Syncope. EXAM: CHEST  2 VIEW COMPARISON:  08/31/2015 FINDINGS: Mild hyperinflation of the lungs. Heart and mediastinal contours are within normal limits. No focal opacities or effusions. No acute bony abnormality. IMPRESSION: Hyperinflation.  No active cardiopulmonary disease. Electronically Signed   By: Rolm Baptise M.D.   On: 12/06/2015 13:02   Ct Head Wo Contrast  12/06/2015  CLINICAL DATA:  81 year old  male who fell backwards at rest Ron with syncope. Initial encounter. EXAM: CT HEAD WITHOUT CONTRAST TECHNIQUE: Contiguous axial images were obtained from the base of the skull through the vertex without intravenous contrast. COMPARISON:  None. FINDINGS: No scalp hematoma identified. Visualized paranasal sinuses and mastoids are clear. Calvarium intact. No acute osseous abnormality identified. No acute orbit findings, postoperative changes to the right globe. Calcified atherosclerosis at the skull base. Dural calcifications. Generalized cerebral volume loss congruent with age. No ventriculomegaly. No midline shift, mass effect, or evidence of intracranial mass lesion. No acute intracranial hemorrhage identified. Patchy and confluent cerebral white matter hypodensity. Heterogeneous density also suggested in the pons (series 2, image 9). Small focus of hypodensity along the lateral left caudate nucleus. No cortically based acute infarct identified. No suspicious intracranial vascular hyperdensity. IMPRESSION: 1. No acute intracranial abnormality. No acute traumatic injury identified. 2. Hypodense changes in the white matter and pons suggestive of chronic small vessel disease. Electronically Signed   By: Genevie Ann M.D.   On: 12/06/2015 12:47   US Carotid Bilateral  12/07/2015  CLINICAL DATA:  Syncopal episodes. EXAM: BILATERAL CAROTID DUPLEX ULTRASOUND TECHNIQUE: Pearline Cables scale imaging, color Doppler and duplex ultrasound were performed of bilateral carotid and vertebral arteries in the neck. COMPARISON:  Ultrasound of August 13, 2012. FINDINGS: Criteria: Quantification of carotid stenosis is based on velocity parameters that correlate the residual internal carotid diameter with NASCET-based stenosis levels, using the diameter of the distal internal carotid lumen as the denominator for stenosis  measurement. The following velocity measurements were obtained: RIGHT ICA:  51/13 cm/sec CCA:  123456 cm/sec SYSTOLIC ICA/CCA  RATIO:  0.6 DIASTOLIC ICA/CCA RATIO:  0.9 ECA:  69 cm/sec LEFT ICA:  60/14 cm/sec CCA:  [id number] cm/sec SYSTOLIC ICA/CCA RATIO:  0.7 DIASTOLIC ICA/CCA RATIO:  1.1 ECA:  98 cm/sec RIGHT CAROTID ARTERY: Mild smooth plaque formation is noted in the proximal right internal carotid artery consistent with less than 50% diameter stenosis based on ultrasound and Doppler criteria. RIGHT VERTEBRAL ARTERY:  Antegrade flow is noted. LEFT CAROTID ARTERY: Mild irregular calcified plaque formation is noted in the proximal left internal carotid artery consistent with less than 50% diameter stenosis based on ultrasound and Doppler criteria. LEFT VERTEBRAL ARTERY:  Antegrade flow is noted. IMPRESSION: Mild plaque formation is noted in both proximal internal carotid arteries consistent with less than 50% diameter stenosis based on ultrasound and Doppler criteria. Electronically Signed   By: Marijo Conception, M.D.   On: 12/07/2015 10:51   Dg Chest Portable 1 View  12/06/2015  CLINICAL DATA:  Syncope EXAM: PORTABLE CHEST 1 VIEW COMPARISON:  12/06/2015 FINDINGS: Cardiomediastinal silhouette is stable. Hyperinflation again noted. No acute infiltrate or pleural effusion. No pulmonary edema. Degenerative changes bilateral acromioclavicular joints. IMPRESSION: No active disease.  Hyperinflation again noted. Electronically Signed   By: Lahoma Crocker M.D.   On: 12/06/2015 13:55   US Abdomen Limited Ruq  12/06/2015  ADDENDUM REPORT: 12/06/2015 15:46 ADDENDUM: There is trace pericholecystic fluid. Clinical correlation is necessary to exclude early cholecystitis. Electronically Signed   By: Lahoma Crocker M.D.   On: 12/06/2015 15:46  12/06/2015  CLINICAL DATA:  Elevated LFTs EXAM: US ABDOMEN LIMITED - RIGHT UPPER QUADRANT COMPARISON:  None. FINDINGS: Gallbladder: Layering debris is noted within gallbladder. Small gallstone measures 5.8 mm. Borderline thickening of gallbladder wall up to 3.3 mm. No sonographic Murphy's sign. Common bile duct:  Diameter: 6.8 mm in diameter within normal limits. Liver: No focal lesion identified. Within normal limits in parenchymal echogenicity. IMPRESSION: 1. Layering sludge and small gallstone is noted within gallbladder measures 5.8 mm. Borderline thickening of gallbladder wall without sonographic Murphy's sign. Normal CBD. Electronically Signed: By: Lahoma Crocker M.D. On: 12/06/2015 15:14    Anti-infectives: Anti-infectives    Start     Dose/Rate Route Frequency Ordered Stop   12/07/15 1215  ciprofloxacin (CIPRO) tablet 250 mg     250 mg Oral 2 times daily 12/07/15 1209        Assessment/Plan:  Labs are personally reviewed showing elevated liver function tests suggestive of an ischemic event as opposed to gallbladder related. I see no indication for gallbladder surgery in this patient as he has had have slowly no abdominal pain and exam shows no sign of tenderness. The liver function test pattern is not suggestive of typical choledocholithiasis. No surgical plans at this time but will follow until the etiology of his poor appetite is identified.  Florene Glen, MD, FACS  12/08/2015

## 2015-12-08 NOTE — Consult Note (Signed)
Subjective: Patient seen for abnormal liver testing. Patient seems to be doing well today. He did have 1 episode of emesis this morning but has been tolerating meals otherwise over the course of the day. He denies any current nausea. There is no abdominal pain. He did have a bowel movement earlier today that was not recorded.  Objective: Vital signs in last 24 hours: Temp:  [98 F (36.7 C)-98.5 F (36.9 C)] 98.5 F (36.9 C) (12/09 1223) Pulse Rate:  [73-88] 80 (12/09 1223) Resp:  [16-18] 16 (12/09 1223) BP: (120-179)/(63-84) 157/71 mmHg (12/09 1223) SpO2:  [100 %] 100 % (12/09 1223) Blood pressure 157/71, pulse 80, temperature 98.5 F (36.9 C), temperature source Oral, resp. rate 16, height 5\' 9"  (1.753 m), weight 64.411 kg (142 lb), SpO2 100 %.   Intake/Output from previous day: 12/08 0701 - 12/09 0700 In: 2709.6 [P.O.:880; I.V.:1829.6] Out: 775 [Urine:775]  Intake/Output this shift: Total I/O In: 1753.3 [P.O.:480; I.V.:1273.3] Out: 300 [Urine:300]   General appearance:  A 81 year old male no distress Resp:  Clear to auscultation Cardio:  Regular rate and rhythm GI:  Soft nontender nondistended bowel sounds positive normoactive. There is also no percussive tenderness over the liver. Extremities:  No clubbing or cyanosis   Lab Results: Results for orders placed or performed during the hospital encounter of 12/06/15 (from the past 24 hour(s))  CBC     Status: Abnormal   Collection Time: 12/08/15  5:34 AM  Result Value Ref Range   WBC 7.6 3.8 - 10.6 K/uL   RBC 3.99 (L) 4.40 - 5.90 MIL/uL   Hemoglobin 10.7 (L) 13.0 - 18.0 g/dL   HCT 32.4 (L) 40.0 - 52.0 %   MCV 81.1 80.0 - 100.0 fL   MCH 26.9 26.0 - 34.0 pg   MCHC 33.1 32.0 - 36.0 g/dL   RDW 19.2 (H) 11.5 - 14.5 %   Platelets 197 150 - 440 K/uL  Protime-INR     Status: Abnormal   Collection Time: 12/08/15  5:34 AM  Result Value Ref Range   Prothrombin Time 21.5 (H) 11.4 - 15.0 seconds   INR 1.88   Comprehensive  metabolic panel     Status: Abnormal   Collection Time: 12/08/15  5:34 AM  Result Value Ref Range   Sodium 132 (L) 135 - 145 mmol/L   Potassium 4.4 3.5 - 5.1 mmol/L   Chloride 105 101 - 111 mmol/L   CO2 22 22 - 32 mmol/L   Glucose, Bld 102 (H) 65 - 99 mg/dL   BUN 25 (H) 6 - 20 mg/dL   Creatinine, Ser 1.24 0.61 - 1.24 mg/dL   Calcium 8.7 (L) 8.9 - 10.3 mg/dL   Total Protein 6.0 (L) 6.5 - 8.1 g/dL   Albumin 2.8 (L) 3.5 - 5.0 g/dL   AST 1475 (H) 15 - 41 U/L   ALT 1561 (H) 17 - 63 U/L   Alkaline Phosphatase 307 (H) 38 - 126 U/L   Total Bilirubin 2.6 (H) 0.3 - 1.2 mg/dL   GFR calc non Af Amer 51 (L) >60 mL/min   GFR calc Af Amer 59 (L) >60 mL/min   Anion gap 5 5 - 15      Recent Labs  12/06/15 1238 12/07/15 0549 12/08/15 0534  WBC 5.0 6.4 7.6  HGB 11.3* 11.4* 10.7*  HCT 35.0* 34.7* 32.4*  PLT 236 221 197   BMET  Recent Labs  12/06/15 1238 12/07/15 0549 12/08/15 0534  NA 134* 134* 132*  K  4.4 4.6 4.4  CL 107 107 105  CO2 21* 22 22  GLUCOSE 122* 99 102*  BUN 34* 27* 25*  CREATININE 1.60* 1.40* 1.24  CALCIUM 9.3 8.6* 8.7*   LFT  Recent Labs  12/07/15 0549 12/08/15 0534  PROT 6.5 6.0*  ALBUMIN 3.0* 2.8*  AST 1013* 1475*  ALT 1188* 1561*  ALKPHOS 306* 307*  BILITOT 1.5* 2.6*  BILIDIR 0.8*  --    PT/INR  Recent Labs  12/06/15 1654 12/08/15 0534  LABPROT 17.9* 21.5*  INR 1.47 1.88   Hepatitis Panel  Recent Labs  12/06/15 2311  HEPBSAG Negative  HCVAB <0.1  HEPAIGM Negative  HEPBIGM Negative   C-Diff No results for input(s): CDIFFTOX in the last 72 hours. No results for input(s): CDIFFPCR in the last 72 hours.   Studies/Results: US Carotid Bilateral  12/07/2015  CLINICAL DATA:  Syncopal episodes. EXAM: BILATERAL CAROTID DUPLEX ULTRASOUND TECHNIQUE: Pearline Cables scale imaging, color Doppler and duplex ultrasound were performed of bilateral carotid and vertebral arteries in the neck. COMPARISON:  Ultrasound of August 13, 2012. FINDINGS: Criteria:  Quantification of carotid stenosis is based on velocity parameters that correlate the residual internal carotid diameter with NASCET-based stenosis levels, using the diameter of the distal internal carotid lumen as the denominator for stenosis measurement. The following velocity measurements were obtained: RIGHT ICA:  51/13 cm/sec CCA:  [id number] cm/sec SYSTOLIC ICA/CCA RATIO:  0.6 DIASTOLIC ICA/CCA RATIO:  0.9 ECA:  69 cm/sec LEFT ICA:  60/14 cm/sec CCA:  [id number] cm/sec SYSTOLIC ICA/CCA RATIO:  0.7 DIASTOLIC ICA/CCA RATIO:  1.1 ECA:  98 cm/sec RIGHT CAROTID ARTERY: Mild smooth plaque formation is noted in the proximal right internal carotid artery consistent with less than 50% diameter stenosis based on ultrasound and Doppler criteria. RIGHT VERTEBRAL ARTERY:  Antegrade flow is noted. LEFT CAROTID ARTERY: Mild irregular calcified plaque formation is noted in the proximal left internal carotid artery consistent with less than 50% diameter stenosis based on ultrasound and Doppler criteria. LEFT VERTEBRAL ARTERY:  Antegrade flow is noted. IMPRESSION: Mild plaque formation is noted in both proximal internal carotid arteries consistent with less than 50% diameter stenosis based on ultrasound and Doppler criteria. Electronically Signed   By: Marijo Conception, M.D.   On: 12/07/2015 10:51    Scheduled Inpatient Medications:   . amLODipine  2.5 mg Oral Daily  . aspirin EC  81 mg Oral Daily  . ciprofloxacin  250 mg Oral BID  . heparin  5,000 Units Subcutaneous 3 times per day  . nicotine  14 mg Transdermal Daily    Continuous Inpatient Infusions:   . sodium chloride 100 mL/hr at 12/07/15 1938    PRN Inpatient Medications:  acetaminophen **OR** acetaminophen, albuterol, hydrALAZINE, ondansetron **OR** ondansetron (ZOFRAN) IV  Miscellaneous:   Assessment:  1. Abnormal liver tests possible ischemic hepatopathy.  Of interest is that he never picked the prescribed Bactrim up from his pharmacy. Some increase of  liver enzymes today not as much as the day before.I am concerned that the PT is increasing.  There is no abdominal pain, no leukocytosis, no fever.    Plan:  1) will obtain ana, asma, ama, EBV and cmv.  If the enzymes continue to increase may need to consider liver biopsy early in the week.   2) CT abd and pelvis with contrast if continued increase lfts over the weekend.  Dr Rayann Heman covering over the weekend.   Lollie Sails MD 12/08/2015, 4:15 PM

## 2015-12-08 NOTE — Evaluation (Signed)
Physical Therapy Evaluation Patient Details Name: Jon Smith MRN: [id number] DOB: Nov 21, 1928 Today's Date: 12/08/2015   History of Present Illness  Jon Smith is a 81 y.o. male with a known history of hypertension, PAD and back pain. The patient has had the poor appetite and oral intake for the past the couple days. He feels not good in the restaurant and passed out for minutes today. But he denies any headache, dizziness or weakness. He denies any seizure or incontinence.  Clinical Impression  Pt reports being at his functional baseline with respect to mobility, transfers, and ambulation. He only reports 1 fall in the last 12 months but he ambulates very slowly with short, shuffling steps. Gait improves in quality and speed with use of rolling walker and recommend use at discharge. Balance is poor during gait with head turning without assistive device. Single leg balance less than 2 seconds bilaterally. Pt will need intermittent supervision at discharge as well as HH PT and rolling walker. Pt reports he plans to discharge to son's home which would be ideal to have supervision. Pt will benefit from skilled PT services to address deficits in strength, balance, and mobility in order to return to full function at home.    Follow Up Recommendations Home health PT;Supervision - Intermittent    Equipment Recommendations  Rolling walker with 5" wheels    Recommendations for Other Services       Precautions / Restrictions Precautions Precautions: Fall Restrictions Weight Bearing Restrictions: No      Mobility  Bed Mobility Overal bed mobility: Needs Assistance Bed Mobility: Supine to Sit;Sit to Supine     Supine to sit: Supervision Sit to supine: Supervision   General bed mobility comments: Pt demonstrates decreased strength but overall functional mobility  Transfers Overall transfer level: Needs assistance Equipment used: Rolling walker (2 wheeled) Transfers: Sit to/from  Stand Sit to Stand: Min guard         General transfer comment: Pt demonstrates reasonable stability during transfer. Overall he requires increased time to perform due to deconditioning but is able to complete  Ambulation/Gait Ambulation/Gait assistance: Min guard Ambulation Distance (Feet): 175 Feet Assistive device: Rolling walker (2 wheeled) Gait Pattern/deviations: Decreased step length - right;Decreased step length - left;Shuffle Gait velocity: Decreased Gait velocity interpretation: <1.8 ft/sec, indicative of risk for recurrent falls General Gait Details: Pt with short shuffling steps during ambulation without assistive device. Horizontal head turns result in instability and slowing of gait. Vitals remains WNL on room air during entire ambulation. Introduced rolling walker and practiced with patient. Pt demonstrates improved step length and stability with ambulation while using a rolling walker. Gait speed improves as well. Pt reports that his ambulation is currently at his baseline and he denies changes in gait quality  Stairs            Wheelchair Mobility    Modified Rankin (Stroke Patients Only)       Balance Overall balance assessment: Needs assistance   Sitting balance-Leahy Scale: Good       Standing balance-Leahy Scale: Fair Standing balance comment: Negative Rhomberg for loss of balance. Pt loses balance within 5 seconds with eyes closed                             Pertinent Vitals/Pain Pain Assessment: No/denies pain    Home Living Family/patient expects to be discharged to:: Private residence Living Arrangements: Alone Available Help at Discharge: Family (Pt reports  he can stay at son's home at discharge) Type of Home: House Home Access: Stairs to enter Entrance Stairs-Rails: Right Entrance Stairs-Number of Steps: 2 Home Layout: One level Home Equipment: None      Prior Function Level of Independence: Independent          Comments: Independent with ADLs/IADLs. Drives     Hand Dominance   Dominant Hand: Right    Extremity/Trunk Assessment   Upper Extremity Assessment: Overall WFL for tasks assessed           Lower Extremity Assessment: Overall WFL for tasks assessed         Communication   Communication: No difficulties  Cognition Arousal/Alertness: Awake/alert Behavior During Therapy: WFL for tasks assessed/performed Overall Cognitive Status: Within Functional Limits for tasks assessed                      General Comments      Exercises        Assessment/Plan    PT Assessment Patient needs continued PT services  PT Diagnosis Difficulty walking;Abnormality of gait;Generalized weakness   PT Problem List Decreased strength;Decreased activity tolerance;Decreased balance;Decreased mobility;Decreased knowledge of use of DME;Decreased safety awareness  PT Treatment Interventions DME instruction;Gait training;Stair training;Therapeutic activities;Therapeutic exercise;Balance training;Neuromuscular re-education;Patient/family education   PT Goals (Current goals can be found in the Care Plan section) Acute Rehab PT Goals Patient Stated Goal: To get stronger PT Goal Formulation: With patient Time For Goal Achievement: 12/22/15    Frequency Min 2X/week   Barriers to discharge        Co-evaluation               End of Session Equipment Utilized During Treatment: Gait belt Activity Tolerance: Patient tolerated treatment well Patient left: in bed;with call bell/phone within reach;with bed alarm set Nurse Communication: Mobility status    Functional Assessment Tool Used: Clinical judgement Functional Limitation: Mobility: Walking and moving around Mobility: Walking and Moving Around Current Status [id number]): At least 20 percent but less than 40 percent impaired, limited or restricted Mobility: Walking and Moving Around Goal Status [phone]): At least 20 percent but less  than 40 percent impaired, limited or restricted    Time: 0805-0835 PT Time Calculation (min) (ACUTE ONLY): 30 min   Charges:   PT Evaluation $Initial PT Evaluation Tier I: 1 Procedure PT Treatments $Gait Training: 8-22 mins   PT G Codes:   PT G-Codes **NOT FOR INPATIENT CLASS** Functional Assessment Tool Used: Clinical judgement Functional Limitation: Mobility: Walking and moving around Mobility: Walking and Moving Around Current Status [id number]): At least 20 percent but less than 40 percent impaired, limited or restricted Mobility: Walking and Moving Around Goal Status [phone]): At least 20 percent but less than 40 percent impaired, limited or restricted   Phillips Grout PT, DPT   Nyelah Emmerich 12/08/2015, 10:20 AM

## 2015-12-08 NOTE — Progress Notes (Signed)
[facility] NAME: Jon Smith    MR#:  [id number]  DATE OF BIRTH:  January 11, 1928  SUBJECTIVE:  CHIEF COMPLAINT:   Chief Complaint  Patient presents with  . Fall   Patient here with fall and noted to have abnormal LFTs. Liver function a bit worse today. Had some N/V this a.m. W/ breakfast but tolerated lunch well. Daughter-in-law at bedside.   REVIEW OF SYSTEMS:    Review of Systems  Constitutional: Negative for fever and chills.  HENT: Negative for congestion and tinnitus.   Eyes: Negative for blurred vision and double vision.  Respiratory: Negative for cough, shortness of breath and wheezing.   Cardiovascular: Negative for chest pain, orthopnea and PND.  Gastrointestinal: Negative for nausea, vomiting, abdominal pain and diarrhea.  Genitourinary: Negative for dysuria and hematuria.  Neurological: Positive for weakness (generalized). Negative for dizziness, sensory change and focal weakness.  All other systems reviewed and are negative.   Nutrition: heart healthy Tolerating Diet: Yes Tolerating PT: Eval noted.    DRUG ALLERGIES:   Allergies  Allergen Reactions  . Aspirin Nausea And Vomiting    VITALS:  Blood pressure 157/71, pulse 80, temperature 98.5 F (36.9 C), temperature source Oral, resp. rate 16, height 5\' 9"  (1.753 m), weight 64.411 kg (142 lb), SpO2 100 %.  PHYSICAL EXAMINATION:   Physical Exam  GENERAL:  81 y.o.-year-old patient lying in the bed with no acute distress.  EYES: Pupils equal, round, reactive to light and accommodation. No scleral icterus. Extraocular muscles intact.  HEENT: Head atraumatic, normocephalic. Oropharynx and nasopharynx clear.  NECK:  Supple, no jugular venous distention. No thyroid enlargement, no tenderness.  LUNGS: Normal breath sounds bilaterally, no wheezing, rales, rhonchi. No use of accessory muscles of respiration.  CARDIOVASCULAR: S1, S2 normal. No murmurs,  rubs, or gallops.  ABDOMEN: Soft, nontender, nondistended. Bowel sounds present. No organomegaly or mass.  EXTREMITIES: No cyanosis, clubbing or edema b/l.    NEUROLOGIC: Cranial nerves II through XII are intact. No focal Motor or sensory deficits b/l.   PSYCHIATRIC: The patient is alert and oriented x 3. Good affect.  SKIN: No obvious rash, lesion, or ulcer.    LABORATORY PANEL:   CBC  Recent Labs Lab 12/08/15 0534  WBC 7.6  HGB 10.7*  HCT 32.4*  PLT 197   ------------------------------------------------------------------------------------------------------------------  Chemistries   Recent Labs Lab 12/08/15 0534  NA 132*  K 4.4  CL 105  CO2 22  GLUCOSE 102*  BUN 25*  CREATININE 1.24  CALCIUM 8.7*  AST 1475*  ALT 1561*  ALKPHOS 307*  BILITOT 2.6*   ------------------------------------------------------------------------------------------------------------------  Cardiac Enzymes  Recent Labs Lab 12/07/15 0549  TROPONINI 0.07*   ------------------------------------------------------------------------------------------------------------------  RADIOLOGY:  US Carotid Bilateral  12/07/2015  CLINICAL DATA:  Syncopal episodes. EXAM: BILATERAL CAROTID DUPLEX ULTRASOUND TECHNIQUE: Pearline Cables scale imaging, color Doppler and duplex ultrasound were performed of bilateral carotid and vertebral arteries in the neck. COMPARISON:  Ultrasound of August 13, 2012. FINDINGS: Criteria: Quantification of carotid stenosis is based on velocity parameters that correlate the residual internal carotid diameter with NASCET-based stenosis levels, using the diameter of the distal internal carotid lumen as the denominator for stenosis measurement. The following velocity measurements were obtained: RIGHT ICA:  51/13 cm/sec CCA:  [id number] cm/sec SYSTOLIC ICA/CCA RATIO:  0.6 DIASTOLIC ICA/CCA RATIO:  0.9 ECA:  69 cm/sec LEFT ICA:  60/14 cm/sec CCA:  [id number] cm/sec SYSTOLIC ICA/CCA RATIO:  0.7 DIASTOLIC  ICA/CCA RATIO:  1.1 ECA:  98 cm/sec RIGHT CAROTID ARTERY: Mild smooth plaque formation is noted in the proximal right internal carotid artery consistent with less than 50% diameter stenosis based on ultrasound and Doppler criteria. RIGHT VERTEBRAL ARTERY:  Antegrade flow is noted. LEFT CAROTID ARTERY: Mild irregular calcified plaque formation is noted in the proximal left internal carotid artery consistent with less than 50% diameter stenosis based on ultrasound and Doppler criteria. LEFT VERTEBRAL ARTERY:  Antegrade flow is noted. IMPRESSION: Mild plaque formation is noted in both proximal internal carotid arteries consistent with less than 50% diameter stenosis based on ultrasound and Doppler criteria. Electronically Signed   By: Marijo Conception, M.D.   On: 12/07/2015 10:51     ASSESSMENT AND PLAN:   81 year old with past medical history of hypertension, glaucoma, BPH, recent UTI who presents to the hospital after a fall/syncope and noted to have abnormal LFTs.  #1 fall/syncope-etiology unclear. Suspected to be vasovagal syncope. -CT head on admission negative. Patient's telemetry has been negative for any acute arrhythmia. Patient's carotid duplex, echocardiogram are also essentially normal. -Continue IV fluid hydration. D/c tele today.  -PT eval noted.    #2 abnormal LFTs-etiology unclear but could be ischemic in nature.  ?? Cholestatic (vs) obstructive.   -Abdominal ultrasound shows gallstones with some sludge but no evidence of acute cholecystitis. Seen by general surgery and no plans for acute surgical intervention. -Also seen by gastroenterology and hepatitis serologies are negative. They will start workup for possible autoimmune hepatitis, ANA, CMV, EBV. Discussed with Dr. Gustavo Lah and he will speak to one of the Hepatologists at [facility] about this patient and get back to use.  -cont. IV fluids, we'll follow LFTs.   #3 hypertension-cont. Norvasc.  - PRN Hydralazine as BP elevated. Hold ACE  given mild ARF.   #4 acute renal failure-likely related to dehydration. - improved w/ IV fluids and Cr.close to baseline now.   #5 UTI-continue oral Cipro.  #6 tobacco abuse-continue nicotine patch   All the records are reviewed and case discussed with Care Management/Social Workerr. Management plans discussed with the patient, family and they are in agreement.  CODE STATUS: Full  DVT Prophylaxis: Heparin subcutaneous  TOTAL TIME TAKING CARE OF THIS PATIENT: 30 minutes.   POSSIBLE D/C IN 1-2 DAYS, DEPENDING ON CLINICAL CONDITION.   Henreitta Leber M.D on 12/08/2015 at 3:24 PM  Between 7am to 6pm - Pager - [phone]  After 6pm go to [email or web address] - password [email or web address] [facility]  Office  [phone]  CC: Primary care physician; Annice Needy, MD

## 2015-12-09 LAB — CBC
HCT: 25.5 % — ABNORMAL LOW (ref 40.0–52.0)
HEMOGLOBIN: 8.8 g/dL — AB (ref 13.0–18.0)
MCH: 27.9 pg (ref 26.0–34.0)
MCHC: 34.5 g/dL (ref 32.0–36.0)
MCV: 80.7 fL (ref 80.0–100.0)
PLATELETS: 153 10*3/uL (ref 150–440)
RBC: 3.16 MIL/uL — AB (ref 4.40–5.90)
RDW: 18.9 % — ABNORMAL HIGH (ref 11.5–14.5)
WBC: 10.6 10*3/uL (ref 3.8–10.6)

## 2015-12-09 LAB — COMPREHENSIVE METABOLIC PANEL
ALK PHOS: 251 U/L — AB (ref 38–126)
ALT: 1357 U/L — AB (ref 17–63)
ANION GAP: 4 — AB (ref 5–15)
AST: 1388 U/L — ABNORMAL HIGH (ref 15–41)
Albumin: 2.2 g/dL — ABNORMAL LOW (ref 3.5–5.0)
BILIRUBIN TOTAL: 2.7 mg/dL — AB (ref 0.3–1.2)
BUN: 33 mg/dL — ABNORMAL HIGH (ref 6–20)
CALCIUM: 8 mg/dL — AB (ref 8.9–10.3)
CO2: 20 mmol/L — AB (ref 22–32)
CREATININE: 1.28 mg/dL — AB (ref 0.61–1.24)
Chloride: 107 mmol/L (ref 101–111)
GFR, EST AFRICAN AMERICAN: 57 mL/min — AB (ref >60)
GFR, EST NON AFRICAN AMERICAN: 49 mL/min — AB (ref >60)
Glucose, Bld: 93 mg/dL (ref 65–99)
Potassium: 4.7 mmol/L (ref 3.5–5.1)
Sodium: 131 mmol/L — ABNORMAL LOW (ref 135–145)
TOTAL PROTEIN: 4.9 g/dL — AB (ref 6.5–8.1)

## 2015-12-09 LAB — URINALYSIS COMPLETE WITH MICROSCOPIC (ARMC ONLY)
BILIRUBIN URINE: NEGATIVE
GLUCOSE, UA: NEGATIVE mg/dL
Ketones, ur: NEGATIVE mg/dL
NITRITE: NEGATIVE
Protein, ur: 30 mg/dL — AB
Specific Gravity, Urine: 1.014 (ref 1.005–1.030)
pH: 5 (ref 5.0–8.0)

## 2015-12-09 LAB — PROTIME-INR
INR: 1.61
Prothrombin Time: 19.2 seconds — ABNORMAL HIGH (ref 11.4–15.0)

## 2015-12-09 MED ORDER — PHYTONADIONE 5 MG PO TABS
10.0000 mg | ORAL_TABLET | Freq: Once | ORAL | Status: AC
  Administered 2015-12-09: 10 mg via ORAL
  Filled 2015-12-09: qty 2

## 2015-12-09 MED ORDER — CEPHALEXIN 500 MG PO CAPS
500.0000 mg | ORAL_CAPSULE | Freq: Two times a day (BID) | ORAL | Status: DC
  Administered 2015-12-09 – 2015-12-13 (×9): 500 mg via ORAL
  Filled 2015-12-09 (×9): qty 1

## 2015-12-09 MED ORDER — CEPHALEXIN 500 MG PO CAPS: 500.0000 mg | ORAL_CAPSULE | Freq: Three times a day (TID) | ORAL | Status: DC

## 2015-12-09 NOTE — Progress Notes (Signed)
Patient ID: Jon Smith, male   DOB: February 17, 1928, 81 y.o.   MRN: [id number] [facility] PROGRESS NOTE  PCP: Annice Needy, MD  HPI/Subjective: Patient is seen some blood from the penis and urine. He feels okay. No complaints of shortness of breath.  Objective: Filed Vitals:   12/09/15 0524 12/09/15 1313  BP: 155/61 146/67  Pulse: 88 87  Temp: 98.1 F (36.7 C) 98.2 F (36.8 C)  Resp: 16 17    Filed Weights   12/06/15 1213  Weight: 64.411 kg (142 lb)    ROS: Review of Systems  Constitutional: Negative for fever and chills.  Eyes: Negative for blurred vision.  Respiratory: Negative for cough and shortness of breath.   Cardiovascular: Negative for chest pain.  Gastrointestinal: Negative for nausea, vomiting, abdominal pain, diarrhea and constipation.  Genitourinary: Positive for hematuria. Negative for dysuria.  Musculoskeletal: Negative for joint pain.  Neurological: Negative for dizziness and headaches.   Exam: Physical Exam  Constitutional: He is oriented to person, place, and time.  HENT:  Nose: No mucosal edema.  Mouth/Throat: No oropharyngeal exudate or posterior oropharyngeal edema.  Eyes: Conjunctivae, EOM and lids are normal. Pupils are equal, round, and reactive to light.  Neck: No JVD present. Carotid bruit is not present. No edema present. No thyroid mass and no thyromegaly present.  Cardiovascular: S1 normal and S2 normal.  Exam reveals no gallop.   No murmur heard. Pulses:      Dorsalis pedis pulses are 2+ on the right side, and 2+ on the left side.  Respiratory: No respiratory distress. He has no wheezes. He has no rhonchi. He has no rales.  GI: Soft. Bowel sounds are normal. There is no tenderness.  Musculoskeletal:       Right ankle: He exhibits no swelling.       Left ankle: He exhibits no swelling.  Lymphadenopathy:    He has no cervical adenopathy.  Neurological: He is alert and oriented to person, place, and time. No cranial  nerve deficit.  Skin: Skin is warm. No rash noted. Nails show no clubbing.  Psychiatric: He has a normal mood and affect.    Data Reviewed: Basic Metabolic Panel:  Recent Labs Lab 12/06/15 1238 12/07/15 0549 12/08/15 0534 12/09/15 0448  NA 134* 134* 132* 131*  K 4.4 4.6 4.4 4.7  CL 107 107 105 107  CO2 21* 22 22 20*  GLUCOSE 122* 99 102* 93  BUN 34* 27* 25* 33*  CREATININE 1.60* 1.40* 1.24 1.28*  CALCIUM 9.3 8.6* 8.7* 8.0*   Liver Function Tests:  Recent Labs Lab 12/06/15 1238 12/07/15 0549 12/08/15 0534 12/09/15 0448  AST 936* 1013* 1475* 1388*  ALT 1198* 1188* 1561* 1357*  ALKPHOS 352* 306* 307* 251*  BILITOT 1.0 1.5* 2.6* 2.7*  PROT 7.1 6.5 6.0* 4.9*  ALBUMIN 3.4* 3.0* 2.8* 2.2*   CBC:  Recent Labs Lab 12/06/15 1238 12/07/15 0549 12/08/15 0534 12/09/15 0718  WBC 5.0 6.4 7.6 10.6  NEUTROABS 3.3  --   --   --   HGB 11.3* 11.4* 10.7* 8.8*  HCT 35.0* 34.7* 32.4* 25.5*  MCV 82.2 82.4 81.1 80.7  PLT 236 221 197 153   Cardiac Enzymes:  Recent Labs Lab 12/06/15 1238 12/06/15 1654 12/06/15 2311 12/07/15 0549  TROPONINI 0.04* 0.03 0.03 0.07*    Scheduled Meds: . amLODipine  2.5 mg Oral Daily  . cephALEXin  500 mg Oral Q12H  . heparin  5,000 Units Subcutaneous 3 times per day  .  nicotine  14 mg Transdermal Daily    Assessment/Plan:  1. Abnormal liver function tests with coagulopathy- case discussed with gastroenterology Dr. Rayann Heman. Patient will be monitored here in the hospital for liver failure and coagulopathy. Need to see the liver function test trend down further and the INR trend down further prior to discharge home. I believe this is secondary to the Bactrim for the patient's urinary tract infection. I will give a dose of vitamin K. 2. Hematuria with urinary tract infection. Patient was on Bactrim as outpatient. Switched over to Cipro while here. Looking back at urine culture, the Escherichia coli is resistant to Cipro. I will switch over to  Keflex. I will stop IV fluid hydration to stop diluting the patient's hemoglobin down. 3. Acute blood loss anemia also dilutional- monitor hemoglobin tomorrow morning. Stop heparin subcutaneous. 4. Essential hypertension continue low-dose amlodipine  Code Status:     Code Status Orders        Start     Ordered   12/06/15 [id number]  Full code   Continuous     12/06/15 1643     Disposition Plan: Home with home health soon  Consultants:  Gastroenterology  Surgery  Antibiotics:  Keflex  Time spent: 25 minutes  Loletha Grayer  [facility]

## 2015-12-09 NOTE — Progress Notes (Addendum)
Scant amount of bleeding during urination noted. Dr. Benjie Karvonen notified. New orders for a CBC. Will continue to monitor resident.

## 2015-12-09 NOTE — Progress Notes (Signed)
Pt noted Blood in urine and brief; Labs results and urinalysis results from this AM reported to Dr. Leslye Peer. No new orders at this time. Primary nurse to continue to monitor.

## 2015-12-09 NOTE — Progress Notes (Signed)
ANTIBIOTIC Renal adjustment per policy  Patient is an 81 yo male with orders for cephalexin 500 mg po q8h.  Patient's est CrCl~38 mL/min.  Patient currently ordered Cephalexin 500 mg po q8h.  Based on renal function, maximum dose in 24 hour period is 1000 mg.  Will renally adjust per policy to cephalexin [id number] mg po BID.   Allergies  Allergen Reactions  . Aspirin Nausea And Vomiting    Patient Measurements: Height: 5\' 9"  (175.3 cm) Weight: 142 lb (64.411 kg) IBW/kg (Calculated) : 70.7   Vital Signs: Temp: 98.1 F (36.7 C) (12/10 0524) Temp Source: Oral (12/10 0524) BP: 155/61 mmHg (12/10 0524) Pulse Rate: 88 (12/10 0524) Intake/Output from previous day: 12/09 0701 - 12/10 0700 In: 1873.3 [P.O.:600; I.V.:1273.3] Out: 950 [Urine:950] Intake/Output from this shift: Total I/O In: 2852.3 [P.O.:264; I.V.:2588.3] Out: 275 [Urine:275]  Labs:  Recent Labs  12/07/15 0549 12/08/15 0534 12/09/15 0448 12/09/15 0718  WBC 6.4 7.6  --  10.6  HGB 11.4* 10.7*  --  8.8*  PLT 221 197  --  153  CREATININE 1.40* 1.24 1.28*  --    Estimated Creatinine Clearance: 38.4 mL/min (by C-Smith formula based on Cr of 1.28). No results for input(s): VANCOTROUGH, VANCOPEAK, VANCORANDOM, GENTTROUGH, GENTPEAK, GENTRANDOM, TOBRATROUGH, TOBRAPEAK, TOBRARND, AMIKACINPEAK, AMIKACINTROU, AMIKACIN in the last 72 hours.   Microbiology: No results found for this or any previous visit (from the past 720 hour(s)).   Jon Smith 12/09/2015,10:54 AM

## 2015-12-09 NOTE — Progress Notes (Signed)
GI Inpatient Follow-up Note  Patient Identification: Jon Smith is a 81 y.o. male with elev LFT unclear etiology  Subjective:  Wants to go home. NO abd apin. No bleeding, no melean, No f/c, no SOB.   Scheduled Inpatient Medications:  . amLODipine  2.5 mg Oral Daily  . cephALEXin  500 mg Oral Q12H  . nicotine  14 mg Transdermal Daily    Continuous Inpatient Infusions:     PRN Inpatient Medications:  albuterol, hydrALAZINE, ondansetron **OR** ondansetron (ZOFRAN) IV   Physical Examination: BP 146/67 mmHg  Pulse 87  Temp(Src) 98.2 F (36.8 C) (Oral)  Resp 17  Ht 5\' 9"  (1.753 m)  Wt 64.411 kg (142 lb)  BMI 20.96 kg/m2  SpO2 99% Gen: NAD, alert and oriented x 4 HEENT: PEERLA, EOMI, Neck: supple, no JVD or thyromegaly Chest: CTA bilaterally, no wheezes, crackles, or other adventitious sounds CV: RRR, no m/g/c/r Abd: soft, NT, ND, +BS in all four quadrants; no HSM, guarding, ridigity, or rebound tenderness Ext: no edema, well perfused with 2+ pulses, Skin: no rash or lesions noted Lymph: no LAD Neuro: no asterixis, grossly non-focal  Data: Lab Results  Component Value Date   WBC 10.6 12/09/2015   HGB 8.8* 12/09/2015   HCT 25.5* 12/09/2015   MCV 80.7 12/09/2015   PLT 153 12/09/2015    Recent Labs Lab 12/07/15 0549 12/08/15 0534 12/09/15 0718  HGB 11.4* 10.7* 8.8*   Lab Results  Component Value Date   NA 131* 12/09/2015   K 4.7 12/09/2015   CL 107 12/09/2015   CO2 20* 12/09/2015   BUN 33* 12/09/2015   CREATININE 1.28* 12/09/2015   Lab Results  Component Value Date   ALT 1357* 12/09/2015   AST 1388* 12/09/2015   ALKPHOS 251* 12/09/2015   BILITOT 2.7* 12/09/2015    Recent Labs Lab 12/06/15 1654  12/09/15 0448  APTT 33  --   --   INR 1.47  < > 1.61  < > = values in this interval not displayed.   Assessment/Plan: [name] is a 81 y.o. male with elev liver enzymes. ? Ischemic vs bactrim. SMall improvmeent in INR and AST/ALT today, no  mental stastus changes.   Recommendations: - daily liver enzymes and INR  - watch for AMS - avoid sedating meds - may need liver biopsy of LFT do not continue to improve.   Please call with questions or concerns.  Darren Caldron, Grace Blight, MD

## 2015-12-09 NOTE — Progress Notes (Signed)
Nurse reprots patient with amber color urine and possible small amunt of blood tinge around penis. Check CBC and UA.  attneding MD to see patient today.

## 2015-12-10 ENCOUNTER — Encounter: Payer: Self-pay | Admitting: Urology

## 2015-12-10 DIAGNOSIS — R972 Elevated prostate specific antigen [PSA]: Secondary | ICD-10-CM

## 2015-12-10 DIAGNOSIS — D649 Anemia, unspecified: Secondary | ICD-10-CM

## 2015-12-10 DIAGNOSIS — R7989 Other specified abnormal findings of blood chemistry: Secondary | ICD-10-CM

## 2015-12-10 DIAGNOSIS — R310 Gross hematuria: Secondary | ICD-10-CM

## 2015-12-10 LAB — COMPREHENSIVE METABOLIC PANEL
ALBUMIN: 2.2 g/dL — AB (ref 3.5–5.0)
ALK PHOS: 285 U/L — AB (ref 38–126)
ALT: 1329 U/L — AB (ref 17–63)
AST: 1303 U/L — ABNORMAL HIGH (ref 15–41)
Anion gap: 3 — ABNORMAL LOW (ref 5–15)
BUN: 34 mg/dL — ABNORMAL HIGH (ref 6–20)
CALCIUM: 8.3 mg/dL — AB (ref 8.9–10.3)
CHLORIDE: 111 mmol/L (ref 101–111)
CO2: 19 mmol/L — AB (ref 22–32)
CREATININE: 1.18 mg/dL (ref 0.61–1.24)
GFR calc Af Amer: >60 mL/min (ref >60)
GFR calc non Af Amer: 54 mL/min — ABNORMAL LOW (ref >60)
GLUCOSE: 81 mg/dL (ref 65–99)
Potassium: 4.6 mmol/L (ref 3.5–5.1)
SODIUM: 133 mmol/L — AB (ref 135–145)
Total Bilirubin: 2 mg/dL — ABNORMAL HIGH (ref 0.3–1.2)
Total Protein: 4.8 g/dL — ABNORMAL LOW (ref 6.5–8.1)

## 2015-12-10 LAB — PROTIME-INR
INR: 1.39
Prothrombin Time: 17.2 seconds — ABNORMAL HIGH (ref 11.4–15.0)

## 2015-12-10 LAB — HEMOGLOBIN: Hemoglobin: 7.1 g/dL — ABNORMAL LOW (ref 13.0–18.0)

## 2015-12-10 LAB — ABO/RH: ABO/RH(D): A POS

## 2015-12-10 LAB — PREPARE RBC (CROSSMATCH)

## 2015-12-10 LAB — PSA: PSA: 5.47 ng/mL — ABNORMAL HIGH (ref 0.00–4.00)

## 2015-12-10 MED ORDER — SODIUM CHLORIDE 0.9 % IV SOLN
Freq: Once | INTRAVENOUS | Status: AC
  Administered 2015-12-10: 15:00:00 via INTRAVENOUS

## 2015-12-10 MED ORDER — TAMSULOSIN HCL 0.4 MG PO CAPS
0.4000 mg | ORAL_CAPSULE | Freq: Every day | ORAL | Status: DC
  Administered 2015-12-10 – 2015-12-12 (×3): 0.4 mg via ORAL
  Filled 2015-12-10 (×3): qty 1

## 2015-12-10 MED ORDER — PHYTONADIONE 5 MG PO TABS
10.0000 mg | ORAL_TABLET | Freq: Once | ORAL | Status: AC
  Administered 2015-12-10: 10 mg via ORAL
  Filled 2015-12-10: qty 2

## 2015-12-10 MED ORDER — ACETAMINOPHEN 325 MG PO TABS
650.0000 mg | ORAL_TABLET | Freq: Once | ORAL | Status: AC
  Administered 2015-12-10: 650 mg via ORAL
  Filled 2015-12-10: qty 2

## 2015-12-10 MED ORDER — FUROSEMIDE 10 MG/ML IJ SOLN
20.0000 mg | Freq: Once | INTRAMUSCULAR | Status: AC
  Administered 2015-12-10: 20 mg via INTRAVENOUS
  Filled 2015-12-10: qty 2

## 2015-12-10 NOTE — Progress Notes (Signed)
  GI Note:  Interval: No improvement in liver enzymes.  INR improving though.   No mental status changes.  No abd pain. Some hematuria, seen by urology today.   Recs: - daily liver enzymes, INR - npo for possible liver biopsy tomorrow if liver enzymes do not trend down.  - avoid sedating meds.   - Dr Gustavo Lah to resume care tomorrow.

## 2015-12-10 NOTE — Consult Note (Signed)
Subjective: Jon Smith is an 81 yo BM who I was asked to see in consultation by Dr. Leslye Peer for hematuria.  He was admitted on 12/8 for a fainting spell and was found to have markedly elevated LFT's that were felt to be secondary to Bactrim given for a UTI.   I was asked to see him for gross hematuria on this admission with anemia and a hgb of 7.1.   His urologic history is significant for a history of prostate cancer treated remotely by Dr. Ernst Spell with cryotherapy.   His recent PSA was 5.44 but there are no comparators.   He was seen on 9/28 at [facility] for a UTI.   He also has a history of stones with 2 prior stone removals and he reports that he has a stone still in one kidney.  He has no flank pain and the hematuria has not been severe enough to cause the anemia.   He has some dysuria and a slow stream.  He denies bone pain.   He has lost about 3 lbs and is having trouble with anorexia.  His urine looks infected at this admission and a culture is pending.  He is on keflex.  ROS:  Review of Systems  Constitutional: Negative for fever and chills.  Gastrointestinal: Negative for abdominal pain.  Genitourinary: Positive for dysuria, frequency (q2hr) and hematuria. Negative for urgency and flank pain.  All other systems reviewed and are negative.   Allergies  Allergen Reactions  . Aspirin Nausea And Vomiting    Past Medical History  Diagnosis Date  . Peripheral arterial disease ([place])   . Hypertension   . Lower extremity ulceration ([place])   . Back pain   . Prostate cancer ([place]) remote    Cryotherapy by Dr. Ernst Spell.  PSA 5.54 in September at [facility]    Past Surgical History  Procedure Laterality Date  . Peritoneal catheter insertion    . Transluminal angioplasty    . Aortogram    . Prostate surgery      Social History   Social History  . Marital Status: Widowed    Spouse Name: N/A  . Number of Children: N/A  . Years of Education: N/A   Occupational History  . Not on  file.   Social History Main Topics  . Smoking status: Current Every Day Smoker    Types: Cigarettes  . Smokeless tobacco: Not on file  . Alcohol Use: No  . Drug Use: Not on file  . Sexual Activity: Not on file   Other Topics Concern  . Not on file   Social History Narrative    Family History  Problem Relation Age of Onset  . Heart disease Brother   . Kidney Stones Sister     Anti-infectives: Anti-infectives    Start     Dose/Rate Route Frequency Ordered Stop   12/09/15 1400  cephALEXin (KEFLEX) capsule 500 mg  Status:  Discontinued     500 mg Oral 3 times per day 12/09/15 1047 12/09/15 1053   12/09/15 1100  cephALEXin (KEFLEX) capsule 500 mg     500 mg Oral Every 12 hours 12/09/15 1053     12/07/15 1215  ciprofloxacin (CIPRO) tablet 250 mg  Status:  Discontinued     250 mg Oral 2 times daily 12/07/15 1209 12/09/15 1047      Current Facility-Administered Medications  Medication Dose Route Frequency Provider Last Rate Last Dose  . 0.9 %  sodium chloride infusion  Intravenous Once Loletha Grayer, MD      . acetaminophen (TYLENOL) tablet 650 mg  650 mg Oral Once Loletha Grayer, MD      . albuterol (PROVENTIL) (2.5 MG/3ML) 0.083% nebulizer solution 2.5 mg  2.5 mg Nebulization Q2H PRN Demetrios Loll, MD      . amLODipine (NORVASC) tablet 2.5 mg  2.5 mg Oral Daily Demetrios Loll, MD   2.5 mg at 12/10/15 1043  . cephALEXin (KEFLEX) capsule 500 mg  500 mg Oral Q12H Crystal G Scarpena, RPH   500 mg at 12/10/15 1043  . furosemide (LASIX) injection 20 mg  20 mg Intravenous Once Loletha Grayer, MD      . hydrALAZINE (APRESOLINE) injection 10 mg  10 mg Intravenous Q6H PRN Henreitta Leber, MD      . nicotine (NICODERM CQ - dosed in mg/24 hours) patch 14 mg  14 mg Transdermal Daily Demetrios Loll, MD   14 mg at 12/10/15 1045  . ondansetron (ZOFRAN) tablet 4 mg  4 mg Oral Q6H PRN Demetrios Loll, MD       Or  . ondansetron [facility] injection 4 mg  4 mg Intravenous Q6H PRN Demetrios Loll, MD   4 mg at 12/08/15  1806  . phytonadione (VITAMIN K) tablet 10 mg  10 mg Oral Once Loletha Grayer, MD      . tamsulosin (FLOMAX) capsule 0.4 mg  0.4 mg Oral QPC supper Loletha Grayer, MD         Objective: Vital signs in last 24 hours: Temp:  [97.8 F (36.6 C)-98.1 F (36.7 C)] 97.9 F (36.6 C) (12/11 1256) Pulse Rate:  [85-87] 85 (12/11 1256) Resp:  [14-18] 18 (12/11 1256) BP: (146-166)/(62-69) 162/64 mmHg (12/11 1256) SpO2:  [99 %] 99 % (12/11 1256)  Intake/Output from previous day: 12/10 0701 - 12/11 0700 In: 3432.3 [P.O.:844; I.V.:2588.3] Out: 1425 [Urine:1425] Intake/Output this shift: Total I/O In: 240 [P.O.:240] Out: 325 [Urine:325]   Physical Exam  Constitutional: He is oriented to person, place, and time and well-developed, well-nourished, and in no distress.  HENT:  Head: Normocephalic and atraumatic.  Neck: Normal range of motion. Neck supple. No thyromegaly present.  Cardiovascular: Normal rate and regular rhythm.   Pulmonary/Chest: Effort normal and breath sounds normal. No respiratory distress.  Abdominal: Soft. Bowel sounds are normal. He exhibits no mass. There is no tenderness. There is no guarding.  Genitourinary:  He has an uncircumcised phallus with a normal scrotum, testes and epididymii.  Anus and perineum are normal with NST and no masses.  The prostate is flat and smooth and the SV's are non-palpable.     Musculoskeletal: Normal range of motion. He exhibits no edema.  Lymphadenopathy:    He has no cervical adenopathy.  He has no supraclavicular, axillary or inguinal adenopathy.   Neurological: He is alert and oriented to person, place, and time.  Skin: Skin is warm and dry.  Psychiatric: Mood and affect normal.  Vitals reviewed.   Lab Results:   Recent Labs  12/08/15 0534 12/09/15 0718 12/10/15 0417  WBC 7.6 10.6  --   HGB 10.7* 8.8* 7.1*  HCT 32.4* 25.5*  --   PLT 197 153  --    BMET  Recent Labs  12/09/15 0448 12/10/15 0417  NA 131* 133*   K 4.7 4.6  CL 107 111  CO2 20* 19*  GLUCOSE 93 81  BUN 33* 34*  CREATININE 1.28* 1.18  CALCIUM 8.0* 8.3*   PT/INR  Recent Labs  12/09/15 0448 12/10/15 0417  LABPROT 19.2* 17.2*  INR 1.61 1.39   ABG No results for input(s): PHART, HCO3 in the last 72 hours.  Invalid input(s): PCO2, PO2  Studies/Results: No results found.   Assessment: Gross hematuria possibly secondary to UTI with a coagulopathy but he has a history of nephrolithiasis and prostate cancer with prior treatment and a detectible PSA.   I don't think the hematuria has been sufficiently severe to cause the anemia.   He has some obstructive and irritative voiding symptoms.    I am going to order a CT AP w/wo along with a PSA and baseline testosterone.  He will probably need cystoscopy for the hematuria and voiding symptoms as welll.     CC: Dr. Marquis Lunch.      Lorrane Mccay J 12/10/2015 [phone]

## 2015-12-10 NOTE — Progress Notes (Signed)
Patient ID: Jon Smith, male   DOB: 20-Mar-1928, 81 y.o.   MRN: [id number] [facility] PROGRESS NOTE  PCP: Annice Needy, MD  HPI/Subjective: Patient with poor appetite and some nausea. No abdominal pain. No blood in the urine has cleared a little bit as per the patient. He does have some hesitancy on starting his urination.  Objective: Filed Vitals:   12/09/15 2042 12/10/15 0555  BP: 166/62 146/69  Pulse: 87 86  Temp: 98.1 F (36.7 C) 97.8 F (36.6 C)  Resp: 14 18    Filed Weights   12/06/15 1213  Weight: 64.411 kg (142 lb)    ROS: Review of Systems  Constitutional: Negative for fever and chills.  Eyes: Negative for blurred vision.  Respiratory: Negative for cough and shortness of breath.   Cardiovascular: Negative for chest pain.  Gastrointestinal: Positive for nausea. Negative for vomiting, abdominal pain, diarrhea and constipation.  Genitourinary: Positive for hematuria. Negative for dysuria.  Musculoskeletal: Negative for joint pain.  Neurological: Negative for dizziness and headaches.   Exam: Physical Exam  Constitutional: He is oriented to person, place, and time.  HENT:  Nose: No mucosal edema.  Mouth/Throat: No oropharyngeal exudate or posterior oropharyngeal edema.  Eyes: Conjunctivae, EOM and lids are normal. Pupils are equal, round, and reactive to light.  Neck: No JVD present. Carotid bruit is not present. No edema present. No thyroid mass and no thyromegaly present.  Cardiovascular: S1 normal and S2 normal.  Exam reveals no gallop.   No murmur heard. Pulses:      Dorsalis pedis pulses are 2+ on the right side, and 2+ on the left side.  Respiratory: No respiratory distress. He has no wheezes. He has no rhonchi. He has no rales.  GI: Soft. Bowel sounds are normal. There is no tenderness.  Musculoskeletal:       Right ankle: He exhibits no swelling.       Left ankle: He exhibits no swelling.  Lymphadenopathy:    He has no cervical  adenopathy.  Neurological: He is alert and oriented to person, place, and time. No cranial nerve deficit.  Skin: Skin is warm. No rash noted. Nails show no clubbing.  Psychiatric: He has a normal mood and affect.    Data Reviewed: Basic Metabolic Panel:  Recent Labs Lab 12/06/15 1238 12/07/15 0549 12/08/15 0534 12/09/15 0448 12/10/15 0417  NA 134* 134* 132* 131* 133*  K 4.4 4.6 4.4 4.7 4.6  CL 107 107 105 107 111  CO2 21* 22 22 20* 19*  GLUCOSE 122* 99 102* 93 81  BUN 34* 27* 25* 33* 34*  CREATININE 1.60* 1.40* 1.24 1.28* 1.18  CALCIUM 9.3 8.6* 8.7* 8.0* 8.3*   Liver Function Tests:  Recent Labs Lab 12/06/15 1238 12/07/15 0549 12/08/15 0534 12/09/15 0448 12/10/15 0417  AST 936* 1013* 1475* 1388* 1303*  ALT 1198* 1188* 1561* 1357* 1329*  ALKPHOS 352* 306* 307* 251* 285*  BILITOT 1.0 1.5* 2.6* 2.7* 2.0*  PROT 7.1 6.5 6.0* 4.9* 4.8*  ALBUMIN 3.4* 3.0* 2.8* 2.2* 2.2*   CBC:  Recent Labs Lab 12/06/15 1238 12/07/15 0549 12/08/15 0534 12/09/15 0718 12/10/15 0417  WBC 5.0 6.4 7.6 10.6  --   NEUTROABS 3.3  --   --   --   --   HGB 11.3* 11.4* 10.7* 8.8* 7.1*  HCT 35.0* 34.7* 32.4* 25.5*  --   MCV 82.2 82.4 81.1 80.7  --   PLT 236 221 197 153  --  Cardiac Enzymes:  Recent Labs Lab 12/06/15 1238 12/06/15 1654 12/06/15 2311 12/07/15 0549  TROPONINI 0.04* 0.03 0.03 0.07*    Scheduled Meds: . sodium chloride   Intravenous Once  . acetaminophen  650 mg Oral Once  . amLODipine  2.5 mg Oral Daily  . cephALEXin  500 mg Oral Q12H  . furosemide  20 mg Intravenous Once  . nicotine  14 mg Transdermal Daily  . phytonadione  10 mg Oral Once  . tamsulosin  0.4 mg Oral QPC supper    Assessment/Plan:  1. Abnormal liver function tests with coagulopathy-  Patient will be monitored here in the hospital for liver failure and coagulopathy.  I believe this is secondary to the Bactrim for the patient's urinary tract infection. INR has improved a little bit with vitamin  K and I will give another dose today. AST and ALT still in the 1300 range. I would like to see them come down prior to discharge home. 2. Hematuria with urinary tract infection. Patient was on Bactrim as outpatient. I switched over to Keflex. Urology consultation called by me. Start Flomax. 3. Acute blood loss anemia also dilutional, now with symptomatic anemia. Transfuse 1 unit of packed red blood cells over 4 hours. Benefits and risks of transfusion explained to the patient and I answered all questions. 4. Essential hypertension continue low-dose amlodipine  Code Status:     Code Status Orders        Start     Ordered   12/06/15 [id number]  Full code   Continuous     12/06/15 1643     Disposition Plan: Home once liver function tests trending better  Consultants:  Gastroenterology  Surgery  Urology  Antibiotics:  Keflex  Time spent: 25 minutes  Loletha Grayer  [facility]

## 2015-12-11 ENCOUNTER — Inpatient Hospital Stay: Payer: Medicare Other

## 2015-12-11 ENCOUNTER — Encounter: Payer: Self-pay | Admitting: Radiology

## 2015-12-11 DIAGNOSIS — R972 Elevated prostate specific antigen [PSA]: Secondary | ICD-10-CM

## 2015-12-11 DIAGNOSIS — R7989 Other specified abnormal findings of blood chemistry: Secondary | ICD-10-CM

## 2015-12-11 DIAGNOSIS — R310 Gross hematuria: Secondary | ICD-10-CM

## 2015-12-11 DIAGNOSIS — D649 Anemia, unspecified: Secondary | ICD-10-CM

## 2015-12-11 LAB — COMPREHENSIVE METABOLIC PANEL
ALBUMIN: 2.5 g/dL — AB (ref 3.5–5.0)
ALK PHOS: 295 U/L — AB (ref 38–126)
ALT: 1203 U/L — ABNORMAL HIGH (ref 17–63)
AST: 1014 U/L — AB (ref 15–41)
Anion gap: 5 (ref 5–15)
BILIRUBIN TOTAL: 2.1 mg/dL — AB (ref 0.3–1.2)
BUN: 33 mg/dL — AB (ref 6–20)
CALCIUM: 8.7 mg/dL — AB (ref 8.9–10.3)
CO2: 20 mmol/L — ABNORMAL LOW (ref 22–32)
Chloride: 107 mmol/L (ref 101–111)
Creatinine, Ser: 1.28 mg/dL — ABNORMAL HIGH (ref 0.61–1.24)
GFR calc Af Amer: 57 mL/min — ABNORMAL LOW (ref >60)
GFR calc non Af Amer: 49 mL/min — ABNORMAL LOW (ref >60)
GLUCOSE: 100 mg/dL — AB (ref 65–99)
POTASSIUM: 4.1 mmol/L (ref 3.5–5.1)
Sodium: 132 mmol/L — ABNORMAL LOW (ref 135–145)
TOTAL PROTEIN: 5.7 g/dL — AB (ref 6.5–8.1)

## 2015-12-11 LAB — PROTIME-INR
INR: 1.08
Prothrombin Time: 14.2 seconds (ref 11.4–15.0)

## 2015-12-11 LAB — TESTOSTERONE: Testosterone: 73 ng/dL — ABNORMAL LOW (ref 348–1197)

## 2015-12-11 LAB — TYPE AND SCREEN
ABO/RH(D): A POS
ANTIBODY SCREEN: NEGATIVE
UNIT DIVISION: 0

## 2015-12-11 LAB — ANTINUCLEAR ANTIBODIES, IFA: ANA Ab, IFA: NEGATIVE

## 2015-12-11 LAB — HEMOGLOBIN: Hemoglobin: 9.1 g/dL — ABNORMAL LOW (ref 13.0–18.0)

## 2015-12-11 MED ORDER — AMLODIPINE BESYLATE 10 MG PO TABS: 10.0000 mg | ORAL_TABLET | Freq: Every day | ORAL | Status: DC

## 2015-12-11 MED ORDER — AMLODIPINE BESYLATE 10 MG PO TABS
10.0000 mg | ORAL_TABLET | Freq: Every day | ORAL | Status: DC
  Administered 2015-12-11 – 2015-12-13 (×3): 10 mg via ORAL
  Filled 2015-12-11 (×3): qty 1

## 2015-12-11 MED ORDER — IOHEXOL 300 MG/ML  SOLN
125.0000 mL | Freq: Once | INTRAMUSCULAR | Status: AC | PRN
  Administered 2015-12-11: 150 mL via INTRAVENOUS

## 2015-12-11 MED ORDER — LISINOPRIL 10 MG PO TABS
10.0000 mg | ORAL_TABLET | Freq: Every day | ORAL | Status: DC
  Administered 2015-12-11 – 2015-12-13 (×3): 10 mg via ORAL
  Filled 2015-12-11 (×3): qty 1

## 2015-12-11 MED ORDER — SODIUM CHLORIDE 0.9 % IV SOLN
INTRAVENOUS | Status: DC
  Administered 2015-12-11 – 2015-12-12 (×2): via INTRAVENOUS

## 2015-12-11 NOTE — Progress Notes (Addendum)
Patient ID: Jon Smith, male   DOB: 04/16/1928, 81 y.o.   MRN: [id number] [facility] PROGRESS NOTE  PCP: Annice Needy, MD  HPI/Subjective: better poor appetite and no nausea.  Objective: Filed Vitals:   12/11/15 1003 12/11/15 1339  BP: 162/70 161/75  Pulse: 83 86  Temp:  98.4 F (36.9 C)  Resp:  17    Filed Weights   12/06/15 1213  Weight: 64.411 kg (142 lb)    ROS: Review of Systems  Constitutional: Negative for fever and chills.  Eyes: Negative for blurred vision.  Respiratory: Negative for cough and shortness of breath.   Cardiovascular: Negative for chest pain.  Gastrointestinal: Negative for nausea, vomiting, abdominal pain, diarrhea and constipation.  Genitourinary: Negative for dysuria and hematuria.  Musculoskeletal: Negative for joint pain.  Neurological: Negative for dizziness and headaches.   Exam: Physical Exam  Constitutional: He is oriented to person, place, and time.  HENT:  Nose: No mucosal edema.  Mouth/Throat: No oropharyngeal exudate or posterior oropharyngeal edema.  Eyes: Conjunctivae, EOM and lids are normal. Pupils are equal, round, and reactive to light.  Neck: No JVD present. Carotid bruit is not present. No edema present. No thyroid mass and no thyromegaly present.  Cardiovascular: S1 normal and S2 normal.  Exam reveals no gallop.   No murmur heard. Pulses:      Dorsalis pedis pulses are 2+ on the right side, and 2+ on the left side.  Respiratory: No respiratory distress. He has no wheezes. He has no rhonchi. He has no rales.  GI: Soft. Bowel sounds are normal. There is no tenderness.  Musculoskeletal:       Right ankle: He exhibits no swelling.       Left ankle: He exhibits no swelling.  Lymphadenopathy:    He has no cervical adenopathy.  Neurological: He is alert and oriented to person, place, and time. No cranial nerve deficit.  Skin: Skin is warm. No rash noted. Nails show no clubbing.  Psychiatric: He has a  normal mood and affect.    Data Reviewed: Basic Metabolic Panel:  Recent Labs Lab 12/07/15 0549 12/08/15 0534 12/09/15 0448 12/10/15 0417 12/11/15 0522  NA 134* 132* 131* 133* 132*  K 4.6 4.4 4.7 4.6 4.1  CL 107 105 107 111 107  CO2 22 22 20* 19* 20*  GLUCOSE 99 102* 93 81 100*  BUN 27* 25* 33* 34* 33*  CREATININE 1.40* 1.24 1.28* 1.18 1.28*  CALCIUM 8.6* 8.7* 8.0* 8.3* 8.7*   Liver Function Tests:  Recent Labs Lab 12/07/15 0549 12/08/15 0534 12/09/15 0448 12/10/15 0417 12/11/15 0522  AST 1013* 1475* 1388* 1303* 1014*  ALT 1188* 1561* 1357* 1329* 1203*  ALKPHOS 306* 307* 251* 285* 295*  BILITOT 1.5* 2.6* 2.7* 2.0* 2.1*  PROT 6.5 6.0* 4.9* 4.8* 5.7*  ALBUMIN 3.0* 2.8* 2.2* 2.2* 2.5*   CBC:  Recent Labs Lab 12/06/15 1238 12/07/15 0549 12/08/15 0534 12/09/15 0718 12/10/15 0417 12/11/15 0522  WBC 5.0 6.4 7.6 10.6  --   --   NEUTROABS 3.3  --   --   --   --   --   HGB 11.3* 11.4* 10.7* 8.8* 7.1* 9.1*  HCT 35.0* 34.7* 32.4* 25.5*  --   --   MCV 82.2 82.4 81.1 80.7  --   --   PLT 236 221 197 153  --   --    Cardiac Enzymes:  Recent Labs Lab 12/06/15 1238 12/06/15 1654 12/06/15 2311 12/07/15 0549  TROPONINI 0.04* 0.03 0.03 0.07*    Scheduled Meds: . amLODipine  2.5 mg Oral Daily  . cephALEXin  500 mg Oral Q12H  . nicotine  14 mg Transdermal Daily  . tamsulosin  0.4 mg Oral QPC supper    Assessment/Plan:  1. Abnormal liver function tests with coagulopathy- secondary to the Bactrim for the patient's urinary tract infection.  INR has improved with vitamin K.  Improving. No liver biopsy and f/u LFT per Dr. Gustavo Lah.  2. Hematuria with urinary tract infection. Patient was on Bactrim as outpatient. Switched over to Keflex.  CT abd w/wo contrast per Urology consultation. continue Flomax. Per urologist, will need outpatient cystoscopy for complete hematuria work up.  3. Acute blood loss anemia also dilutional, now with symptomatic anemia.  S/p  Transfusion 1 unit of packed red blood cells. Hb 9.1.  4. Essential hypertension. continue amlodipine and resume lisinopril. 5. Hyponatremia and dehydration.  Start NS, F/u CMP.    Code Status:     Code Status Orders        Start     Ordered   12/06/15 [id number]  Full code   Continuous     12/06/15 1643     All the records are reviewed and case discussed with Care Management/Social Workerr. Management plans discussed with the patient, her daughter and they are in agreement. I discussed with Dr. Gustavo Lah.  TOTAL TIME TAKING CARE OF THIS PATIENT: 42 minutes.  Greater than 50% time was spent on coordination of care and face-to-face counseling.  POSSIBLE D/C IN 2 DAYS, DEPENDING ON CLINICAL CONDITION.   Consultants:  Gastroenterology  Surgery  Urology  Antibiotics:  Keflex  Time spent: 25 minutes  Demetrios Loll  [facility]

## 2015-12-11 NOTE — Care Management Important Message (Signed)
Important Message  Patient Details  Name: Jon Smith MRN: [id number] Date of Birth: 1928-05-29   Medicare Important Message Given:  Yes    [name] A [name] 12/11/2015, 1:46 PM

## 2015-12-11 NOTE — Progress Notes (Signed)
Physical Therapy Treatment Patient Details Name: Jon Smith MRN: [id number] DOB: 1928/01/28 Today's Date: 12/11/2015    History of Present Illness Jon Smith is a 81 y.o. male with a known history of hypertension, PAD and back pain. The patient has had the poor appetite and oral intake for the past the couple days. He feels not good in the restaurant and passed out for minutes today. But he denies any headache, dizziness or weakness. He denies any seizure or incontinence.    PT Comments    Pt is making good progress towards goals with safe technique during ambulation. No LOB noted, however still demonstrates slow speed. Pt indicates this is his baseline level. Pt is motivated to continue PT to be able to dc home. Good endurance with there-ex.  Follow Up Recommendations  Home health PT;Supervision - Intermittent     Equipment Recommendations  Rolling walker with 5" wheels    Recommendations for Other Services       Precautions / Restrictions Precautions Precautions: Fall Restrictions Weight Bearing Restrictions: No    Mobility  Bed Mobility Overal bed mobility: Needs Assistance Bed Mobility: Supine to Sit;Sit to Supine     Supine to sit: Supervision Sit to supine: Supervision   General bed mobility comments: safe technique performed and pt able to scoot himself up in bed independently  Transfers Overall transfer level: Needs assistance Equipment used: Rolling walker (2 wheeled) Transfers: Sit to/from Stand Sit to Stand: Min guard         General transfer comment: safe technique with cues to push from seated position.  Ambulation/Gait Ambulation/Gait assistance: Min guard Ambulation Distance (Feet): 200 Feet Assistive device: Rolling walker (2 wheeled) Gait Pattern/deviations: Step-through pattern     General Gait Details: Pt ambulated using rw with 1 cue for increasing step length to prevent toe drag. Pt able to complete 10' walk in 10 seconds  indicating slow speed. No LOB noted and pt able to carry conversation during ambulation.   Stairs            Wheelchair Mobility    Modified Rankin (Stroke Patients Only)       Balance                                    Cognition Arousal/Alertness: Awake/alert Behavior During Therapy: WFL for tasks assessed/performed Overall Cognitive Status: Within Functional Limits for tasks assessed                      Exercises Other Exercises Other Exercises: supine there-x performed B LE including quad sets, SAQ, resisted heel slides, supine bridging and hip add squeezes. All ther-ex performed x 12 reps with cues for correct technique./ Other Exercises: Pt ambulated to bathroom, needs supervision for don/doffing diaper. No LOB noted    General Comments        Pertinent Vitals/Pain Pain Assessment: No/denies pain    Home Living                      Prior Function            PT Goals (current goals can now be found in the care plan section) Acute Rehab PT Goals Patient Stated Goal: To get stronger PT Goal Formulation: With patient Time For Goal Achievement: 12/22/15 Progress towards PT goals: Progressing toward goals    Frequency  Min 2X/week    PT  Plan Current plan remains appropriate    Co-evaluation             End of Session Equipment Utilized During Treatment: Gait belt Activity Tolerance: Patient tolerated treatment well Patient left: in bed;with bed alarm set     Time: 1040-1108 PT Time Calculation (min) (ACUTE ONLY): 28 min  Charges:  $Gait Training: 8-22 mins $Therapeutic Exercise: 8-22 mins                    G Codes:      Dione Mccombie December 27, 2015, 11:19 AM Greggory Stallion, PT, DPT [phone]

## 2015-12-11 NOTE — Consult Note (Signed)
Subjective: Patient seen for abnormal liver tests. Patient is doing well today. He denies any nausea or abdominal pain. His appetite is still not returning yet, although he would like to have some solid food. He is currently awaiting CT scan.  Objective: Vital signs in last 24 hours: Temp:  [97.9 F (36.6 C)-98.6 F (37 C)] 98.3 F (36.8 C) (12/12 0517) Pulse Rate:  [79-92] 83 (12/12 1003) Resp:  [16-20] 16 (12/12 0517) BP: (139-184)/(54-70) 162/70 mmHg (12/12 1003) SpO2:  [99 %-100 %] 100 % (12/12 0517) Blood pressure 162/70, pulse 83, temperature 98.3 F (36.8 C), temperature source Oral, resp. rate 16, height 5\' 9"  (1.753 m), weight 64.411 kg (142 lb), SpO2 100 %.   Intake/Output from previous day: 12/11 0701 - 12/12 0700 In: 870 [P.O.:480; Blood:390] Out: 1550 [Urine:1550]  Intake/Output this shift: Total I/O In: 0  Out: 125 [Urine:125]   General appearance:  81 year old male no distress Resp:  Clear to auscultation Cardio:  Regular rate and rhythm GI:  Soft nontender nondistended bowel sounds positive normoactive Extremities:  No clubbing cyanosis or edema   Lab Results: Results for orders placed or performed during the hospital encounter of 12/06/15 (from the past 24 hour(s))  Protime-INR     Status: None   Collection Time: 12/11/15  5:22 AM  Result Value Ref Range   Prothrombin Time 14.2 11.4 - 15.0 seconds   INR 1.08   Comprehensive metabolic panel     Status: Abnormal   Collection Time: 12/11/15  5:22 AM  Result Value Ref Range   Sodium 132 (L) 135 - 145 mmol/L   Potassium 4.1 3.5 - 5.1 mmol/L   Chloride 107 101 - 111 mmol/L   CO2 20 (L) 22 - 32 mmol/L   Glucose, Bld 100 (H) 65 - 99 mg/dL   BUN 33 (H) 6 - 20 mg/dL   Creatinine, Ser 1.28 (H) 0.61 - 1.24 mg/dL   Calcium 8.7 (L) 8.9 - 10.3 mg/dL   Total Protein 5.7 (L) 6.5 - 8.1 g/dL   Albumin 2.5 (L) 3.5 - 5.0 g/dL   AST 1014 (H) 15 - 41 U/L   ALT 1203 (H) 17 - 63 U/L   Alkaline Phosphatase 295 (H) 38 -  126 U/L   Total Bilirubin 2.1 (H) 0.3 - 1.2 mg/dL   GFR calc non Af Amer 49 (L) >60 mL/min   GFR calc Af Amer 57 (L) >60 mL/min   Anion gap 5 5 - 15  Hemoglobin     Status: Abnormal   Collection Time: 12/11/15  5:22 AM  Result Value Ref Range   Hemoglobin 9.1 (L) 13.0 - 18.0 g/dL      Recent Labs  12/09/15 0718 12/10/15 0417 12/11/15 0522  WBC 10.6  --   --   HGB 8.8* 7.1* 9.1*  HCT 25.5*  --   --   PLT 153  --   --    BMET  Recent Labs  12/09/15 0448 12/10/15 0417 12/11/15 0522  NA 131* 133* 132*  K 4.7 4.6 4.1  CL 107 111 107  CO2 20* 19* 20*  GLUCOSE 93 81 100*  BUN 33* 34* 33*  CREATININE 1.28* 1.18 1.28*  CALCIUM 8.0* 8.3* 8.7*   LFT  Recent Labs  12/11/15 0522  PROT 5.7*  ALBUMIN 2.5*  AST 1014*  ALT 1203*  ALKPHOS 295*  BILITOT 2.1*   PT/INR  Recent Labs  12/10/15 0417 12/11/15 0522  LABPROT 17.2* 14.2  INR 1.39 1.08  Hepatitis Panel No results for input(s): HEPBSAG, HCVAB, HEPAIGM, HEPBIGM in the last 72 hours. C-Diff No results for input(s): CDIFFTOX in the last 72 hours. No results for input(s): CDIFFPCR in the last 72 hours.   Studies/Results: No results found.  Scheduled Inpatient Medications:   . amLODipine  2.5 mg Oral Daily  . cephALEXin  500 mg Oral Q12H  . nicotine  14 mg Transdermal Daily  . tamsulosin  0.4 mg Oral QPC supper    Continuous Inpatient Infusions:     PRN Inpatient Medications:  albuterol, hydrALAZINE, ondansetron **OR** ondansetron (ZOFRAN) IV  Miscellaneous:   Assessment:  1. Abnormal liver enzymes possibly ischemic hepatopathy secondary to the etiology of his syncopal episode. Today they have begun to drop significantly more trending toward normal. Pro-time is improved also.  Plan:  1. Continue daily LFT and pro time. 2. Will advance diet to soft solid foods/regular diet again after his CT scan today. Following.  Lollie Sails MD 12/11/2015, 12:46 PM

## 2015-12-11 NOTE — Progress Notes (Signed)
No events overnight No further hematuria per patient CT pending  Filed Vitals:   12/10/15 1802 12/10/15 1945 12/11/15 0517 12/11/15 1003  BP: 139/54 153/62 155/65 162/70  Pulse: 79 92 85 83  Temp: 98.2 F (36.8 C) 98.2 F (36.8 C) 98.3 F (36.8 C)   TempSrc: Oral Oral Oral   Resp: 18 18 16    Height:      Weight:      SpO2: 100% 100% 100%    I/O last 3 completed shifts: In: [id number] [P.O.:480; Blood:390] Out: 2000 [Urine:2000] Total I/O In: 0  Out: 125 [Urine:125]   NAD Soft NT ND No foley  CBC    Component Value Date/Time   WBC 10.6 12/09/2015 0718   RBC 3.16* 12/09/2015 0718   HGB 9.1* 12/11/2015 0522   HCT 25.5* 12/09/2015 0718   PLT 153 12/09/2015 0718   MCV 80.7 12/09/2015 0718   MCH 27.9 12/09/2015 0718   MCHC 34.5 12/09/2015 0718   RDW 18.9* 12/09/2015 0718   LYMPHSABS 1.0 12/06/2015 1238   MONOABS 0.6 12/06/2015 1238   EOSABS 0.0 12/06/2015 1238   BASOSABS 0.1 12/06/2015 1238    BMP Latest Ref Rng 12/11/2015 12/10/2015 12/09/2015  Glucose 65 - 99 mg/dL 100(H) 81 93  BUN 6 - 20 mg/dL 33(H) 34(H) 33(H)  Creatinine 0.61 - 1.24 mg/dL 1.28(H) 1.18 1.28(H)  Sodium 135 - 145 mmol/L 132(L) 133(L) 131(L)  Potassium 3.5 - 5.1 mmol/L 4.1 4.6 4.7  Chloride 101 - 111 mmol/L 107 111 107  CO2 22 - 32 mmol/L 20(L) 19(L) 20(L)  Calcium 8.9 - 10.3 mg/dL 8.7(L) 8.3(L) 8.0(L)    PSA: 5.47 now (5.44 September 2016)  Testosterone level pending   A/P: 81 y.o. Male with gross hematuria possibly secondary to UTI with a coagulopathy.  Patient also has a history of nephrolithiasis and prostate cancer with prior treatment and a detectible PSA.PSA stable. -await CT Urogram results -await baseline testosterone level -will need outpatient cystoscopy for complete hematuria work up  Will continue to follow

## 2015-12-11 NOTE — Progress Notes (Signed)
Per Dr. Gustavo Lah okay to place order for regular diet.

## 2015-12-12 DIAGNOSIS — E43 Unspecified severe protein-calorie malnutrition: Secondary | ICD-10-CM | POA: Insufficient documentation

## 2015-12-12 DIAGNOSIS — R310 Gross hematuria: Secondary | ICD-10-CM | POA: Insufficient documentation

## 2015-12-12 LAB — URINALYSIS COMPLETE WITH MICROSCOPIC (ARMC ONLY)
BACTERIA UA: NONE SEEN
Bilirubin Urine: NEGATIVE
Glucose, UA: NEGATIVE mg/dL
KETONES UR: NEGATIVE mg/dL
LEUKOCYTES UA: NEGATIVE
NITRITE: NEGATIVE
PROTEIN: NEGATIVE mg/dL
SPECIFIC GRAVITY, URINE: 1.014 (ref 1.005–1.030)
Squamous Epithelial / LPF: NONE SEEN
pH: 5 (ref 5.0–8.0)

## 2015-12-12 LAB — OCCULT BLOOD X 1 CARD TO LAB, STOOL: Fecal Occult Bld: POSITIVE — AB

## 2015-12-12 LAB — MITOCHONDRIAL ANTIBODIES: Mitochondrial M2 Ab, IgG: 14.2 Units (ref 0.0–20.0)

## 2015-12-12 LAB — COMPREHENSIVE METABOLIC PANEL
ALBUMIN: 2.4 g/dL — AB (ref 3.5–5.0)
ALK PHOS: 249 U/L — AB (ref 38–126)
ALT: 910 U/L — ABNORMAL HIGH (ref 17–63)
ANION GAP: 3 — AB (ref 5–15)
AST: 630 U/L — ABNORMAL HIGH (ref 15–41)
BUN: 25 mg/dL — ABNORMAL HIGH (ref 6–20)
CHLORIDE: 108 mmol/L (ref 101–111)
CO2: 21 mmol/L — AB (ref 22–32)
Calcium: 8.2 mg/dL — ABNORMAL LOW (ref 8.9–10.3)
Creatinine, Ser: 1.06 mg/dL (ref 0.61–1.24)
GFR calc non Af Amer: >60 mL/min (ref >60)
GLUCOSE: 103 mg/dL — AB (ref 65–99)
POTASSIUM: 3.8 mmol/L (ref 3.5–5.1)
SODIUM: 132 mmol/L — AB (ref 135–145)
Total Bilirubin: 1.7 mg/dL — ABNORMAL HIGH (ref 0.3–1.2)
Total Protein: 5.2 g/dL — ABNORMAL LOW (ref 6.5–8.1)

## 2015-12-12 LAB — CMV IGM

## 2015-12-12 LAB — ANTI-SMOOTH MUSCLE ANTIBODY, IGG: F-Actin IgG: 22 Units — ABNORMAL HIGH (ref 0–19)

## 2015-12-12 LAB — PROTIME-INR
INR: 1.19
PROTHROMBIN TIME: 15.3 s — AB (ref 11.4–15.0)

## 2015-12-12 LAB — CBC
HCT: 24.6 % — ABNORMAL LOW (ref 40.0–52.0)
HEMOGLOBIN: 8.3 g/dL — AB (ref 13.0–18.0)
MCH: 27.5 pg (ref 26.0–34.0)
MCHC: 33.6 g/dL (ref 32.0–36.0)
MCV: 81.7 fL (ref 80.0–100.0)
PLATELETS: 141 10*3/uL — AB (ref 150–440)
RBC: 3.01 MIL/uL — ABNORMAL LOW (ref 4.40–5.90)
RDW: 18.4 % — AB (ref 11.5–14.5)
WBC: 8.7 10*3/uL (ref 3.8–10.6)

## 2015-12-12 LAB — EPSTEIN-BARR VIRUS VCA, IGM

## 2015-12-12 LAB — RSV(RESPIRATORY SYNCYTIAL VIRUS) AB, BLOOD: RSV Ab: 1:32 {titer} — ABNORMAL HIGH

## 2015-12-12 LAB — EPSTEIN-BARR VIRUS VCA, IGG: EBV VCA IgG: >600 U/mL — ABNORMAL HIGH (ref 0.0–17.9)

## 2015-12-12 MED ORDER — LATANOPROST 0.005 % OP SOLN
1.0000 [drp] | Freq: Every day | OPHTHALMIC | Status: DC
  Administered 2015-12-12: 1 [drp] via OPHTHALMIC
  Filled 2015-12-12: qty 2.5

## 2015-12-12 MED ORDER — ENSURE ENLIVE PO LIQD
237.0000 mL | Freq: Two times a day (BID) | ORAL | Status: DC
  Administered 2015-12-12 – 2015-12-13 (×3): 237 mL via ORAL

## 2015-12-12 NOTE — Care Management (Signed)
Spoke with patient for discharge planning. Son present in room.  Patient will be living with Son when discharged. He is normally home alone.  Patient stated that he feels that he is at his normal ability as far as ambulating and that he is unsure if he needs PT at this time. He would like to have a walker prior to discharge to take home with him. Anticipate discharge soon with assistance of family. Will ask attending for order for Walker.

## 2015-12-12 NOTE — Progress Notes (Signed)
Initial Nutrition Assessment  DOCUMENTATION CODES:   Severe malnutrition in context of chronic illness  INTERVENTION:  Meals and snacks: Cater to pt preferences Medical Nutrition Supplement Therapy: recommend ensure BID for added nutrition   NUTRITION DIAGNOSIS:   Inadequate oral intake related to acute illness as evidenced by per patient/family report.    GOAL:   Patient will meet greater than or equal to 90% of their needs    MONITOR:    (Energy intake, Gastrointestinal profile, )  REASON FOR ASSESSMENT:   LOS    ASSESSMENT:      Pt admitted with UTI, elevated LFTs from recent medication, ischemic hepatopathy, hematuria  Past Medical History  Diagnosis Date  . Peripheral arterial disease ([place])   . Hypertension   . Lower extremity ulceration ([place])   . Back pain   . Prostate cancer ([place]) remote    Cryotherapy by Dr. Ernst Spell.  PSA 5.54 in September at [facility]    Current Nutrition: ate most of breakfast this am. Reports feeling better this am  Food/Nutrition-Related History: pt reports poor po intake for the last 4 days prior to admission   Scheduled Medications:  . amLODipine  10 mg Oral Daily  . cephALEXin  500 mg Oral Q12H  . lisinopril  10 mg Oral Daily  . nicotine  14 mg Transdermal Daily  . tamsulosin  0.4 mg Oral QPC supper       Electrolyte/Renal Profile and Glucose Profile:   Recent Labs Lab 12/10/15 0417 12/11/15 0522 12/12/15 0519  NA 133* 132* 132*  K 4.6 4.1 3.8  CL 111 107 108  CO2 19* 20* 21*  BUN 34* 33* 25*  CREATININE 1.18 1.28* 1.06  CALCIUM 8.3* 8.7* 8.2*  GLUCOSE 81 100* 103*   Protein Profile:   Recent Labs Lab 12/10/15 0417 12/11/15 0522 12/12/15 0519  ALBUMIN 2.2* 2.5* 2.4*    Gastrointestinal Profile: Last BM:12/11   Nutrition-Focused Physical Exam Findings: Nutrition-Focused physical exam completed. Findings are  Moderate to severe fat depletion, moderate to severe muscle depletion, and no edema.       Weight Change: noted 5% weight loss in the last 4 months per wt encounters    Diet Order:  Diet regular Room service appropriate?: Yes; Fluid consistency:: Thin  Skin:   reviewed   Height:   Ht Readings from Last 1 Encounters:  12/06/15 5\' 9"  (1.753 m)    Weight:   Wt Readings from Last 1 Encounters:  12/06/15 142 lb (64.411 kg)    Ideal Body Weight:     BMI:  Body mass index is 20.96 kg/(m^2).  Estimated Nutritional Needs:   Kcal:  BEE 1325 kcals (IF 1.0-1.3, AF 1.3)1722-2239 kcals/d  Protein:  (1.0-1.2 g/kg) 64-77 g/d  Fluid:  (25-65ml/kg) 1600-1964ml/d  EDUCATION NEEDS:   No education needs identified at this time  HIGH Care Level  Jon Smith, [place], [place] (pager)

## 2015-12-12 NOTE — Progress Notes (Signed)
Physical Therapy Treatment Patient Details Name: Jon Smith MRN: [id number] DOB: Jul 16, 1928 Today's Date: 12/12/2015    History of Present Illness Jon Smith is a 81 y.o. male with a known history of hypertension, PAD and back pain. The patient has had the poor appetite and oral intake for the past the couple days. He feels not good in the restaurant and passed out for minutes today. But he denies any headache, dizziness or weakness. He denies any seizure or incontinence.    PT Comments    Pt is making good progress towards goals with improved ambulation speed this session. Pt refused to sit in recliner this date despite therapist encouragement. Agreed to try to sit up with RN staff for meals. Good endurance with ambulation and improved functional mobility with use of AD. Recommend continued use for safety.  Follow Up Recommendations  Home health PT;Supervision - Intermittent     Equipment Recommendations  Rolling walker with 5" wheels    Recommendations for Other Services       Precautions / Restrictions Precautions Precautions: Fall Restrictions Weight Bearing Restrictions: No    Mobility  Bed Mobility Overal bed mobility: Needs Assistance Bed Mobility: Supine to Sit;Sit to Supine     Supine to sit: Supervision Sit to supine: Supervision   General bed mobility comments: safe technique performed and pt able to scoot himself up in bed independently  Transfers Overall transfer level: Needs assistance Equipment used: Rolling walker (2 wheeled) Transfers: Sit to/from Stand Sit to Stand: Min assist         General transfer comment: 1st attempt without AD and pt requires min assist. 2nd attempt with RW and pt demonstrates improved functional independence, only requiring cga. Safe technique performed with hand placement  Ambulation/Gait Ambulation/Gait assistance: Min guard Ambulation Distance (Feet): 220 Feet Assistive device: Rolling walker (2 wheeled) Gait  Pattern/deviations: Step-through pattern     General Gait Details: reciprocal gait pattern performed with slightly improved gait speed this date. Pt demonstrates upright posture with ambulation, 1 cue to keep rw closer to body. Pt able to carry conversation during ambulation, no SOB noted.   Stairs            Wheelchair Mobility    Modified Rankin (Stroke Patients Only)       Balance                                    Cognition Arousal/Alertness: Awake/alert Behavior During Therapy: WFL for tasks assessed/performed Overall Cognitive Status: Within Functional Limits for tasks assessed                      Exercises Other Exercises Other Exercises: ambulated to [facility] with positive BM. Min assist for clean up. Pt able to stand at [facility] with supervision, no LOB while therapist assisted with hyigene.    General Comments        Pertinent Vitals/Pain Pain Assessment: No/denies pain    Home Living                      Prior Function            PT Goals (current goals can now be found in the care plan section) Acute Rehab PT Goals Patient Stated Goal: To get stronger PT Goal Formulation: With patient Time For Goal Achievement: 12/22/15 Progress towards PT goals: Progressing toward goals  Frequency  Min 2X/week    PT Plan Current plan remains appropriate    Co-evaluation             End of Session Equipment Utilized During Treatment: Gait belt Activity Tolerance: Patient tolerated treatment well Patient left: in bed;with bed alarm set     Time: [id number] PT Time Calculation (min) (ACUTE ONLY): 24 min  Charges:  $Gait Training: 8-22 mins $Therapeutic Activity: 8-22 mins                    G Codes:      Caitlin Ainley 23-Dec-2015, 11:56 AM  Greggory Stallion, PT, DPT [phone]

## 2015-12-12 NOTE — Progress Notes (Signed)
Urology Consult Follow Up  Subjective: Patient is an 81 year old male who was admitted after suffering a fainting spell and found to have elevated LFT's who also had gross hematuria.  He has a history of prostate cancer treated with cryotherapy with Dr. Madelin Headings.  His current  PSA is 5.47 and his serum testosterone is 73.  CT Urogram performed yesterday did not demonstrate malignancies or metastatic disease.  He was found to have course calcifications in his left kidney, mild bladder trabeculation and a bladder diverticulum.  Today, the patient states he is experiencing a burning sensation after urination.  He does not report any gross hematuria.   He is also complaining of obstructive symptoms.  He is currently receiving cephalexin.    Anti-infectives: Anti-infectives    Start     Dose/Rate Route Frequency Ordered Stop   12/09/15 1400  cephALEXin (KEFLEX) capsule 500 mg  Status:  Discontinued     500 mg Oral 3 times per day 12/09/15 1047 12/09/15 1053   12/09/15 1100  cephALEXin (KEFLEX) capsule 500 mg     500 mg Oral Every 12 hours 12/09/15 1053     12/07/15 1215  ciprofloxacin (CIPRO) tablet 250 mg  Status:  Discontinued     250 mg Oral 2 times daily 12/07/15 1209 12/09/15 1047      Current Facility-Administered Medications  Medication Dose Route Frequency Provider Last Rate Last Dose  . albuterol (PROVENTIL) (2.5 MG/3ML) 0.083% nebulizer solution 2.5 mg  2.5 mg Nebulization Q2H PRN Demetrios Loll, MD      . amLODipine (NORVASC) tablet 10 mg  10 mg Oral Daily Demetrios Loll, MD   10 mg at 12/11/15 1740  . cephALEXin (KEFLEX) capsule 500 mg  500 mg Oral Q12H Crystal Jennefer Bravo, RPH   500 mg at 12/11/15 2255  . hydrALAZINE (APRESOLINE) injection 10 mg  10 mg Intravenous Q6H PRN Henreitta Leber, MD      . lisinopril (PRINIVIL,ZESTRIL) tablet 10 mg  10 mg Oral Daily Demetrios Loll, MD   10 mg at 12/11/15 1740  . nicotine (NICODERM CQ - dosed in mg/24 hours) patch 14 mg  14 mg Transdermal Daily Demetrios Loll, MD    14 mg at 12/11/15 0954  . ondansetron (ZOFRAN) tablet 4 mg  4 mg Oral Q6H PRN Demetrios Loll, MD       Or  . ondansetron [facility]) injection 4 mg  4 mg Intravenous Q6H PRN Demetrios Loll, MD   4 mg at 12/08/15 1806  . tamsulosin (FLOMAX) capsule 0.4 mg  0.4 mg Oral QPC supper Loletha Grayer, MD   0.4 mg at 12/11/15 1740     Objective: Vital signs in last 24 hours: Temp:  [97.8 F (36.6 C)-98.4 F (36.9 C)] 97.8 F (36.6 C) (12/13 0403) Pulse Rate:  [83-94] 87 (12/13 0403) Resp:  [16-17] 16 (12/13 0403) BP: (130-164)/(56-75) 131/69 mmHg (12/13 0403) SpO2:  [91 %-99 %] 91 % (12/13 0403)  Intake/Output from previous day: 12/12 0701 - 12/13 0700 In: 626 [P.O.:240; I.V.:386] Out: 450 [Urine:450] Intake/Output this shift: Total I/O In: 605 [I.V.:605] Out: 0    Physical Exam Constitutional: Well nourished. Alert and oriented, No acute distress. HEENT: [place] AT, moist mucus membranes. Trachea midline, no masses. Cardiovascular: No clubbing, cyanosis, or edema. Respiratory: Normal respiratory effort, no increased work of breathing. GI: Abdomen is soft, non tender, non distended, no abdominal masses. Liver and spleen not palpable.  No hernias appreciated.  Stool sample for occult testing is  not indicated.   GU: No CVA tenderness.  No bladder fullness or masses.   Skin: No rashes, bruises or suspicious lesions. Lymph: No cervical or inguinal adenopathy. Neurologic: Grossly intact, no focal deficits, moving all 4 extremities. Psychiatric: Normal mood and affect.  Lab Results:   Recent Labs  12/11/15 0522 12/12/15 0519  WBC  --  8.7  HGB 9.1* 8.3*  HCT  --  24.6*  PLT  --  141*   BMET  Recent Labs  12/11/15 0522 12/12/15 0519  NA 132* 132*  K 4.1 3.8  CL 107 108  CO2 20* 21*  GLUCOSE 100* 103*  BUN 33* 25*  CREATININE 1.28* 1.06  CALCIUM 8.7* 8.2*   PT/INR  Recent Labs  12/11/15 0522 12/12/15 0519  LABPROT 14.2 15.3*  INR 1.08 1.19   ABG No results for input(s):  PHART, HCO3 in the last 72 hours.  Invalid input(s): PCO2, PO2  Studies/Results: Ct Abdomen Pelvis W Wo Contrast  12/11/2015  CLINICAL DATA:  Rule gross hematuria.  Urinary tract infection. EXAM: CT ABDOMEN AND PELVIS WITHOUT AND WITH CONTRAST TECHNIQUE: Multidetector CT imaging of the abdomen and pelvis was performed following the standard protocol before and following the bolus administration of intravenous contrast. CONTRAST:  152mL OMNIPAQUE IOHEXOL 300 MG/ML  SOLN COMPARISON:  Abdominal ultrasound 12/06/2015 FINDINGS: Lower chest: Small bilateral pleural effusions, RIGHT greater than LEFT. Hepatobiliary: Multiple low-density lesions in liver consistent benign cysts. There is periportal edema. No biliary duct dilatation. Gallbladder is normal. Common bile duct normal. Pancreas: Pancreas is normal. No ductal dilatation. No pancreatic inflammation. Spleen: Normal spleen Adrenals/urinary tract: There is enlargement of the LEFT adrenal gland to 24 by 27 mm. The gland is high-density on precontrast imaging and demonstrates post-contrast enhancement however, on the delayed imaging lesion has significantly washout (66%) which is consistent with a benign adenoma. Non IV contrast images demonstrate coarse calcification the lower pole of LEFT kidney measuring 13 mm 9 mm. No ureterolithiasis. Cortical phase imaging demonstrates no enhancing cortical. Bilateral nonenhancing renal cysts Delayed pyelogram phase demonstrates no collecting systems or ureters No bladder stones or filling defects in the bladder which does not excluded a bladder lesion. There is mild trabeculation of the bladder wall. A wide-mouth diverticulum present along the posterior RIGHT aspect bladder. Stomach/Bowel: Stomach, small bowel, appendix, and cecum are normal. The colon and rectosigmoid colon are normal. Vascular/Lymphatic: Abdominal aorta is normal caliber with atherosclerotic calcification. There is no retroperitoneal or periportal  lymphadenopathy. No pelvic lymphadenopathy. Reproductive: Prostate normal. Other: Mild haziness to the mesentery. No organized fluid collections or free fluid. Musculoskeletal: No aggressive osseous lesion. IMPRESSION: 1. Coarse a calculi in the lower pole of the LEFT kidney. No ureterolithiasis. 2. No enhancing renal cortical. 3. Bilateral Bosniak 1 renal cysts. 4. No filling defects collecting systems or ureters. 5. No bladder lesion identified. There is mild trabeculation and RIGHT diverticulum. 6. Benign LEFT adrenal adenoma 7. Atherosclerotic calcification aorta. 8. Small effusions, mild portable edema, and haziness of the mesentery suggest vascular overload. Electronically Signed   By: Suzy Bouchard M.D.   On: 12/11/2015 15:44     Assessment: Patient with a history of prostate cancer admitted for elevated LFT's and gross hematuria.  UOP has decreased.  His Cr is normal at 1.06.  Remains anemic, but not likely from urological source.  Urine is clear.     Plan: Suggest a bladder scan to rule out urinary retention as a cause of patient burning after urination and obstructive  symptoms.  Place a foley if residual is over 300 cc.  He will need a cystoscopy as an outpatient to complete his hematuria work up.    CC:     LOS: 6 days    [facility] 12/12/2015

## 2015-12-12 NOTE — Consult Note (Signed)
Subjective: Patient seen for abnormal liver tests. She is doing well today. There is no nausea or vomiting. He is tolerating a regular diet. His appetite is returning. He has no complaint.  Objective: Vital signs in last 24 hours: Temp:  [97.8 F (36.6 C)-98.2 F (36.8 C)] 98.2 F (36.8 C) (12/13 1337) Pulse Rate:  [79-94] 88 (12/13 1337) Resp:  [16-18] 18 (12/13 1337) BP: (130-164)/(50-75) 133/50 mmHg (12/13 1337) SpO2:  [91 %-100 %] 100 % (12/13 1337) Blood pressure 133/50, pulse 88, temperature 98.2 F (36.8 C), temperature source Oral, resp. rate 18, height 5\' 9"  (1.753 m), weight 64.411 kg (142 lb), SpO2 100 %.   Intake/Output from previous day: 12/12 0701 - 12/13 0700 In: 626 [P.O.:240; I.V.:386] Out: 450 [Urine:450]  Intake/Output this shift: Total I/O In: 1085 [P.O.:480; I.V.:605] Out: 400 [Urine:400]   General appearance:  81 year old male no distress. Resp:  Bilaterally clear to auscultation. Cardio:  Regular rate and rhythm without rub or gallop. GI:  Soft nontender nondistended bowel sounds positive normoactive. Extremities:  No clubbing cyanosis or edema.   Lab Results: Results for orders placed or performed during the hospital encounter of 12/06/15 (from the past 24 hour(s))  Protime-INR     Status: Abnormal   Collection Time: 12/12/15  5:19 AM  Result Value Ref Range   Prothrombin Time 15.3 (H) 11.4 - 15.0 seconds   INR 1.19   CBC     Status: Abnormal   Collection Time: 12/12/15  5:19 AM  Result Value Ref Range   WBC 8.7 3.8 - 10.6 K/uL   RBC 3.01 (L) 4.40 - 5.90 MIL/uL   Hemoglobin 8.3 (L) 13.0 - 18.0 g/dL   HCT 24.6 (L) 40.0 - 52.0 %   MCV 81.7 80.0 - 100.0 fL   MCH 27.5 26.0 - 34.0 pg   MCHC 33.6 32.0 - 36.0 g/dL   RDW 18.4 (H) 11.5 - 14.5 %   Platelets 141 (L) 150 - 440 K/uL  Comprehensive metabolic panel     Status: Abnormal   Collection Time: 12/12/15  5:19 AM  Result Value Ref Range   Sodium 132 (L) 135 - 145 mmol/L   Potassium 3.8 3.5 -  5.1 mmol/L   Chloride 108 101 - 111 mmol/L   CO2 21 (L) 22 - 32 mmol/L   Glucose, Bld 103 (H) 65 - 99 mg/dL   BUN 25 (H) 6 - 20 mg/dL   Creatinine, Ser 1.06 0.61 - 1.24 mg/dL   Calcium 8.2 (L) 8.9 - 10.3 mg/dL   Total Protein 5.2 (L) 6.5 - 8.1 g/dL   Albumin 2.4 (L) 3.5 - 5.0 g/dL   AST 630 (H) 15 - 41 U/L   ALT 910 (H) 17 - 63 U/L   Alkaline Phosphatase 249 (H) 38 - 126 U/L   Total Bilirubin 1.7 (H) 0.3 - 1.2 mg/dL   GFR calc non Af Amer >60 >60 mL/min   GFR calc Af Amer >60 >60 mL/min   Anion gap 3 (L) 5 - 15  Occult blood card to lab, stool RN will collect     Status: Abnormal   Collection Time: 12/12/15 11:22 AM  Result Value Ref Range   Fecal Occult Bld POSITIVE (A) NEGATIVE      Recent Labs  12/10/15 0417 12/11/15 0522 12/12/15 0519  WBC  --   --  8.7  HGB 7.1* 9.1* 8.3*  HCT  --   --  24.6*  PLT  --   --  141*   BMET  Recent Labs  12/10/15 0417 12/11/15 0522 12/12/15 0519  NA 133* 132* 132*  K 4.6 4.1 3.8  CL 111 107 108  CO2 19* 20* 21*  GLUCOSE 81 100* 103*  BUN 34* 33* 25*  CREATININE 1.18 1.28* 1.06  CALCIUM 8.3* 8.7* 8.2*   LFT  Recent Labs  12/12/15 0519  PROT 5.2*  ALBUMIN 2.4*  AST 630*  ALT 910*  ALKPHOS 249*  BILITOT 1.7*   PT/INR  Recent Labs  12/11/15 0522 12/12/15 0519  LABPROT 14.2 15.3*  INR 1.08 1.19   Hepatitis Panel No results for input(s): HEPBSAG, HCVAB, HEPAIGM, HEPBIGM in the last 72 hours. C-Diff No results for input(s): CDIFFTOX in the last 72 hours. No results for input(s): CDIFFPCR in the last 72 hours.   Studies/Results: Ct Abdomen Pelvis W Wo Contrast  12/11/2015  CLINICAL DATA:  Rule gross hematuria.  Urinary tract infection. EXAM: CT ABDOMEN AND PELVIS WITHOUT AND WITH CONTRAST TECHNIQUE: Multidetector CT imaging of the abdomen and pelvis was performed following the standard protocol before and following the bolus administration of intravenous contrast. CONTRAST:  178mL OMNIPAQUE IOHEXOL 300 MG/ML   SOLN COMPARISON:  Abdominal ultrasound 12/06/2015 FINDINGS: Lower chest: Small bilateral pleural effusions, RIGHT greater than LEFT. Hepatobiliary: Multiple low-density lesions in liver consistent benign cysts. There is periportal edema. No biliary duct dilatation. Gallbladder is normal. Common bile duct normal. Pancreas: Pancreas is normal. No ductal dilatation. No pancreatic inflammation. Spleen: Normal spleen Adrenals/urinary tract: There is enlargement of the LEFT adrenal gland to 24 by 27 mm. The gland is high-density on precontrast imaging and demonstrates post-contrast enhancement however, on the delayed imaging lesion has significantly washout (66%) which is consistent with a benign adenoma. Non IV contrast images demonstrate coarse calcification the lower pole of LEFT kidney measuring 13 mm 9 mm. No ureterolithiasis. Cortical phase imaging demonstrates no enhancing cortical. Bilateral nonenhancing renal cysts Delayed pyelogram phase demonstrates no collecting systems or ureters No bladder stones or filling defects in the bladder which does not excluded a bladder lesion. There is mild trabeculation of the bladder wall. A wide-mouth diverticulum present along the posterior RIGHT aspect bladder. Stomach/Bowel: Stomach, small bowel, appendix, and cecum are normal. The colon and rectosigmoid colon are normal. Vascular/Lymphatic: Abdominal aorta is normal caliber with atherosclerotic calcification. There is no retroperitoneal or periportal lymphadenopathy. No pelvic lymphadenopathy. Reproductive: Prostate normal. Other: Mild haziness to the mesentery. No organized fluid collections or free fluid. Musculoskeletal: No aggressive osseous lesion. IMPRESSION: 1. Coarse a calculi in the lower pole of the LEFT kidney. No ureterolithiasis. 2. No enhancing renal cortical. 3. Bilateral Bosniak 1 renal cysts. 4. No filling defects collecting systems or ureters. 5. No bladder lesion identified. There is mild trabeculation  and RIGHT diverticulum. 6. Benign LEFT adrenal adenoma 7. Atherosclerotic calcification aorta. 8. Small effusions, mild portable edema, and haziness of the mesentery suggest vascular overload. Electronically Signed   By: Suzy Bouchard M.D.   On: 12/11/2015 15:44    Scheduled Inpatient Medications:   . amLODipine  10 mg Oral Daily  . cephALEXin  500 mg Oral Q12H  . feeding supplement (ENSURE ENLIVE)  237 mL Oral BID BM  . latanoprost  1 drop Both Eyes QHS  . lisinopril  10 mg Oral Daily  . nicotine  14 mg Transdermal Daily  . tamsulosin  0.4 mg Oral QPC supper    Continuous Inpatient Infusions:     PRN Inpatient Medications:  albuterol, hydrALAZINE, ondansetron **OR**  ondansetron (ZOFRAN) IV  Miscellaneous:   Assessment:  1. Abnormal liver enzymes likely related to ischemic hepatopathy. Labs are improving. Time was improved today as well.  Plan:  1. Continue supportive care. We'll need to be seen as an outpatient GI follow-up in about a week or so. Recheck liver enzymes that time.  Lollie Sails MD 12/12/2015, 4:41 PM

## 2015-12-12 NOTE — Progress Notes (Signed)
Patient ID: Jon Smith, male   DOB: October 12, 1928, 81 y.o.   MRN: [id number] [facility] PROGRESS NOTE  PCP: Annice Needy, MD  HPI/Subjective: better appetite and oral intake,  no nausea.  Objective: Filed Vitals:   12/12/15 1036 12/12/15 1337  BP: 131/52 133/50  Pulse: 79 88  Temp:  98.2 F (36.8 C)  Resp:  18    Filed Weights   12/06/15 1213  Weight: 64.411 kg (142 lb)    ROS: Review of Systems  Constitutional: Negative for fever and chills.  Eyes: Negative for blurred vision.  Respiratory: Negative for cough and shortness of breath.   Cardiovascular: Negative for chest pain.  Gastrointestinal: Negative for nausea, vomiting, abdominal pain, diarrhea and constipation.  Genitourinary: Negative for dysuria and hematuria.  Musculoskeletal: Negative for joint pain.  Neurological: Negative for dizziness and headaches.   Exam: Physical Exam  Constitutional: He is oriented to person, place, and time.  HENT:  Nose: No mucosal edema.  Mouth/Throat: No oropharyngeal exudate or posterior oropharyngeal edema.  Eyes: Conjunctivae, EOM and lids are normal. Pupils are equal, round, and reactive to light.  Neck: No JVD present. Carotid bruit is not present. No edema present. No thyroid mass and no thyromegaly present.  Cardiovascular: S1 normal and S2 normal.  Exam reveals no gallop.   No murmur heard. Pulses:      Dorsalis pedis pulses are 2+ on the right side, and 2+ on the left side.  Respiratory: No respiratory distress. He has no wheezes. He has no rhonchi. He has no rales.  GI: Soft. Bowel sounds are normal. There is no tenderness.  Musculoskeletal:       Right ankle: He exhibits no swelling.       Left ankle: He exhibits no swelling.  Lymphadenopathy:    He has no cervical adenopathy.  Neurological: He is alert and oriented to person, place, and time. No cranial nerve deficit.  Skin: Skin is warm. No rash noted. Nails show no clubbing.  Psychiatric: He  has a normal mood and affect.    Data Reviewed: Basic Metabolic Panel:  Recent Labs Lab 12/08/15 0534 12/09/15 0448 12/10/15 0417 12/11/15 0522 12/12/15 0519  NA 132* 131* 133* 132* 132*  K 4.4 4.7 4.6 4.1 3.8  CL 105 107 111 107 108  CO2 22 20* 19* 20* 21*  GLUCOSE 102* 93 81 100* 103*  BUN 25* 33* 34* 33* 25*  CREATININE 1.24 1.28* 1.18 1.28* 1.06  CALCIUM 8.7* 8.0* 8.3* 8.7* 8.2*   Liver Function Tests:  Recent Labs Lab 12/08/15 0534 12/09/15 0448 12/10/15 0417 12/11/15 0522 12/12/15 0519  AST 1475* 1388* 1303* 1014* 630*  ALT 1561* 1357* 1329* 1203* 910*  ALKPHOS 307* 251* 285* 295* 249*  BILITOT 2.6* 2.7* 2.0* 2.1* 1.7*  PROT 6.0* 4.9* 4.8* 5.7* 5.2*  ALBUMIN 2.8* 2.2* 2.2* 2.5* 2.4*   CBC:  Recent Labs Lab 12/06/15 1238 12/07/15 0549 12/08/15 0534 12/09/15 0718 12/10/15 0417 12/11/15 0522 12/12/15 0519  WBC 5.0 6.4 7.6 10.6  --   --  8.7  NEUTROABS 3.3  --   --   --   --   --   --   HGB 11.3* 11.4* 10.7* 8.8* 7.1* 9.1* 8.3*  HCT 35.0* 34.7* 32.4* 25.5*  --   --  24.6*  MCV 82.2 82.4 81.1 80.7  --   --  81.7  PLT 236 221 197 153  --   --  141*   Cardiac Enzymes:  Recent Labs Lab 12/06/15 1238 12/06/15 1654 12/06/15 2311 12/07/15 0549  TROPONINI 0.04* 0.03 0.03 0.07*    Scheduled Meds: . amLODipine  10 mg Oral Daily  . cephALEXin  500 mg Oral Q12H  . feeding supplement (ENSURE ENLIVE)  237 mL Oral BID BM  . lisinopril  10 mg Oral Daily  . nicotine  14 mg Transdermal Daily  . tamsulosin  0.4 mg Oral QPC supper    Assessment/Plan:  1. Abnormal liver function tests with coagulopathy- possible secondary to the Bactrim for the patient's urinary tract infection.   Improving. No liver biopsy and f/u LFT per Dr. Gustavo Lah.  INR has improved with vitamin K.  2. Hematuria with urinary tract infection. Patient was on Bactrim as outpatient. Switched to Keflex. CT Urogram performed yesterday did not demonstrate malignancies or metastatic disease.  continue Flomax. Per urologist, will need outpatient cystoscopy for complete hematuria work up.  3. Acute blood loss anemia also dilutional, now with symptomatic anemia.  S/p Transfusion 1 unit of packed red blood cells. Hb down from 9.1 to 8.3. No active bleeding. Follow-up hemoglobin in a.m.  4. Essential hypertension. continue amlodipine and resume lisinopril. 5. Hyponatremia. Stable. 6.  Dehydration.  Better.   Code Status:     Code Status Orders        Start     Ordered   12/06/15 [id number]  Full code   Continuous     12/06/15 1643     All the records are reviewed and case discussed with Care Management/Social Workerr. Management plans discussed with the patient, her daughter and they are in agreement. I discussed with Dr. Gustavo Lah.  TOTAL TIME TAKING CARE OF THIS PATIENT: 33 minutes.  Greater than 50% time was spent on coordination of care and face-to-face counseling.  POSSIBLE D/C TO HOME WITH HOME HEALTH AND PT IN 1 DAYS, DEPENDING ON CLINICAL CONDITION.   Consultants:  Gastroenterology  Surgery  Urology  Antibiotics:  Keflex  Time spent: 25 minutes  Demetrios Loll  [facility]

## 2015-12-13 LAB — COMPREHENSIVE METABOLIC PANEL
ALK PHOS: 232 U/L — AB (ref 38–126)
ALT: 698 U/L — AB (ref 17–63)
ANION GAP: 3 — AB (ref 5–15)
AST: 372 U/L — ABNORMAL HIGH (ref 15–41)
Albumin: 2.5 g/dL — ABNORMAL LOW (ref 3.5–5.0)
BILIRUBIN TOTAL: 1.2 mg/dL (ref 0.3–1.2)
BUN: 26 mg/dL — ABNORMAL HIGH (ref 6–20)
CALCIUM: 9 mg/dL (ref 8.9–10.3)
CO2: 24 mmol/L (ref 22–32)
CREATININE: 1.09 mg/dL (ref 0.61–1.24)
Chloride: 107 mmol/L (ref 101–111)
GFR calc non Af Amer: 60 mL/min — ABNORMAL LOW (ref >60)
Glucose, Bld: 120 mg/dL — ABNORMAL HIGH (ref 65–99)
Potassium: 4.2 mmol/L (ref 3.5–5.1)
SODIUM: 134 mmol/L — AB (ref 135–145)
TOTAL PROTEIN: 5.5 g/dL — AB (ref 6.5–8.1)

## 2015-12-13 LAB — CBC
HEMATOCRIT: 24 % — AB (ref 40.0–52.0)
HEMOGLOBIN: 8.2 g/dL — AB (ref 13.0–18.0)
MCH: 27.9 pg (ref 26.0–34.0)
MCHC: 34 g/dL (ref 32.0–36.0)
MCV: 82.1 fL (ref 80.0–100.0)
Platelets: 139 10*3/uL — ABNORMAL LOW (ref 150–440)
RBC: 2.92 MIL/uL — ABNORMAL LOW (ref 4.40–5.90)
RDW: 18.5 % — AB (ref 11.5–14.5)
WBC: 8.8 10*3/uL (ref 3.8–10.6)

## 2015-12-13 LAB — PROTIME-INR
INR: 1.11
Prothrombin Time: 14.5 seconds (ref 11.4–15.0)

## 2015-12-13 NOTE — Progress Notes (Signed)
12/13/2015 3:01 PM  BP 123/50 mmHg  Pulse 94  Temp(Src) 98 F (36.7 C) (Oral)  Resp 18  Ht 5\' 9"  (1.753 m)  Wt 64.411 kg (142 lb)  BMI 20.96 kg/m2  SpO2 100% Patient discharged per MD orders. Discharge instructions reviewed with patient and family and patient/family verbalized understanding. IV's removed per policy. Home health set up via Liliane Channel, Case Manager. Discharged via wheelchair escorted by volunteers.  Almedia Balls, RN

## 2015-12-13 NOTE — Discharge Instructions (Signed)
Heart healthy diet. Activity as tolerated. HHPT. F/u Dr. Gustavo Lah for GI workup.

## 2015-12-13 NOTE — Discharge Summary (Signed)
[facility] NAME: Jon Smith    MR#:  [id number]  DATE OF BIRTH:  02/25/1928  DATE OF ADMISSION:  12/06/2015 ADMITTING PHYSICIAN: Demetrios Loll, MD  DATE OF DISCHARGE: 12/13/2015 PRIMARY CARE PHYSICIAN: Annice Needy, MD    ADMISSION DIAGNOSIS:  Elevated liver function tests [R79.89] Elevated troponin [R79.89] Elevated LFTs [R79.89]   DISCHARGE DIAGNOSIS:   Abnormal liver function tests with coagulopathy Hematuria with urinary tract infection Acute blood loss anemia  SECONDARY DIAGNOSIS:   Past Medical History  Diagnosis Date  . Peripheral arterial disease ([place])   . Hypertension   . Lower extremity ulceration ([place])   . Back pain   . Prostate cancer ([place]) remote    Cryotherapy by Dr. Ernst Spell.  PSA 5.54 in September at [facility]:   1. Abnormal liver function tests with coagulopathy- possible secondary to the Bactrim for the patient's urinary tract infection. Improving. No liver biopsy per Dr. Gustavo Lah. INR has improved with vitamin K. Follow up LFT with Dr. Angelica Chessman as outpatient.  2. Hematuria with urinary tract infection. Patient was on Bactrim as outpatient. Switched to Keflex for 7 days. CT Urogram performed yesterday did not demonstrate malignancies or metastatic disease. continue Flomax. Per urologist, will need outpatient cystoscopy for complete hematuria work up.  3. Acute blood loss anemia with symptomatic anemia. S/p Transfusion 1 unit of packed red blood cells. Hb down from 9.1 to 8.3 and 8.2 today, but no melena or bloody stool. Positive FOTB. Hold ASA. Follow-up hemoglobin and Dr. Gustavo Lah as outpatient for further GI workup.  4. Essential hypertension. continue lisinopril but discontinue norvasc. 5. Hyponatremia. Better. 6. Dehydration. Better. 7. Cholelithiasis. abdominal ultrasound show sludges and gallstone. No acute surgical plans at this time per Dr.  Burt Knack. 8. Fall/syncope-etiology unclear. Could be vasovagal syncope. -CT head on admission negative. Patient's telemetry has been negative for any acute arrhythmia. Patient's carotid duplex, echocardiogram are also essentially normal.  tobacco abuse - treated with nicotine patch DISCHARGE CONDITIONS:   Stable, discharge to home with home health and PT today.  CONSULTS OBTAINED:  Treatment Team:  Irine Seal, MD  DRUG ALLERGIES:   Allergies  Allergen Reactions  . Aspirin Nausea And Vomiting    DISCHARGE MEDICATIONS:   Current Discharge Medication List    CONTINUE these medications which have NOT CHANGED   Details  latanoprost (XALATAN) 0.005 % ophthalmic solution     lisinopril (PRINIVIL,ZESTRIL) 20 MG tablet     tamsulosin (FLOMAX) 0.4 MG CAPS capsule Take 1 capsule (0.4 mg total) by mouth daily. At bedtime Qty: 90 capsule, Refills: 3      STOP taking these medications     amLODipine (NORVASC) 5 MG tablet      aspirin EC 81 MG tablet      sulfamethoxazole-trimethoprim (BACTRIM DS,SEPTRA DS) 800-160 MG tablet          DISCHARGE INSTRUCTIONS:    If you experience worsening of your admission symptoms, develop shortness of breath, life threatening emergency, suicidal or homicidal thoughts you must seek medical attention immediately by calling 911 or calling your MD immediately  if symptoms less severe.  You Must read complete instructions/literature along with all the possible adverse reactions/side effects for all the Medicines you take and that have been prescribed to you. Take any new Medicines after you have completely understood and accept all the possible adverse reactions/side effects.   Please note  You were cared for by  a hospitalist during your hospital stay. If you have any questions about your discharge medications or the care you received while you were in the hospital after you are discharged, you can call the unit and asked to speak with the  hospitalist on call if the hospitalist that took care of you is not available. Once you are discharged, your primary care physician will handle any further medical issues. Please note that NO REFILLS for any discharge medications will be authorized once you are discharged, as it is imperative that you return to your primary care physician (or establish a relationship with a primary care physician if you do not have one) for your aftercare needs so that they can reassess your need for medications and monitor your lab values.    Today   SUBJECTIVE   No complaint, much  Better appetite.   VITAL SIGNS:  Blood pressure 123/50, pulse 94, temperature 98 F (36.7 C), temperature source Oral, resp. rate 18, height 5\' 9"  (1.753 m), weight 64.411 kg (142 lb), SpO2 100 %.  I/O:   Intake/Output Summary (Last 24 hours) at 12/13/15 1211 Last data filed at 12/13/15 0900  Gross per 24 hour  Intake    960 ml  Output    495 ml  Net    465 ml    PHYSICAL EXAMINATION:  GENERAL:  81 y.o.-year-old patient lying in the bed with no acute distress.  EYES: Pupils equal, round, reactive to light and accommodation. No scleral icterus. Extraocular muscles intact.  HEENT: Head atraumatic, normocephalic. Oropharynx and nasopharynx clear.  NECK:  Supple, no jugular venous distention. No thyroid enlargement, no tenderness.  LUNGS: Normal breath sounds bilaterally, no wheezing, rales,rhonchi or crepitation. No use of accessory muscles of respiration.  CARDIOVASCULAR: S1, S2 normal. No murmurs, rubs, or gallops.  ABDOMEN: Soft, non-tender, non-distended. Bowel sounds present. No organomegaly or mass.  EXTREMITIES: No pedal edema, cyanosis, or clubbing.  NEUROLOGIC: Cranial nerves II through XII are intact. Muscle strength 45 in all extremities. Sensation intact. Gait not checked.  PSYCHIATRIC: The patient is alert and oriented x 3.  SKIN: No obvious rash, lesion, or ulcer.   DATA REVIEW:   CBC  Recent  Labs Lab 12/13/15 0425  WBC 8.8  HGB 8.2*  HCT 24.0*  PLT 139*    Chemistries   Recent Labs Lab 12/13/15 0425  NA 134*  K 4.2  CL 107  CO2 24  GLUCOSE 120*  BUN 26*  CREATININE 1.09  CALCIUM 9.0  AST 372*  ALT 698*  ALKPHOS 232*  BILITOT 1.2    Cardiac Enzymes  Recent Labs Lab 12/07/15 0549  TROPONINI 0.07*    Microbiology Results  Results for orders placed or performed in visit on 09/27/15  CULTURE, URINE COMPREHENSIVE     Status: Abnormal   Collection Time: 09/27/15 10:00 AM  Result Value Ref Range Status   Urine Culture, Comprehensive Final report (A)  Final   Result 1 Comment (A)  Final    Comment: Escherichia coli, identified by an automated biochemical system. Greater than 100,000 colony forming units per mL    ANTIMICROBIAL SUSCEPTIBILITY Comment  Final    Comment:       ** S = Susceptible; I = Intermediate; R = Resistant **                    P = Positive; N = Negative             MICS are expressed  in micrograms per mL    Antibiotic                 RSLT#1    RSLT#2    RSLT#3    RSLT#4 Amoxicillin/Clavulanic Acid    S Ampicillin                     R Cefazolin                      R Cefepime                       S Ceftriaxone                    S Cefuroxime                     S Cephalothin                    R Ciprofloxacin                  R Ertapenem                      S Gentamicin                     S Imipenem                       S Levofloxacin                   R Nitrofurantoin                 S Piperacillin                   R Tetracycline                   S Tobramycin                     S Trimethoprim/Sulfa             S   Microscopic Examination     Status: Abnormal   Collection Time: 09/27/15 10:03 AM  Result Value Ref Range Status   WBC, UA 11-30 (A) 0 -  5 /hpf Final   RBC, UA None seen 0 -  2 /hpf Final   Epithelial Cells (non renal) 0-10 0 - 10 /hpf Final   Bacteria, UA Many (A) None seen/Few Final     RADIOLOGY:  Ct Abdomen Pelvis W Wo Contrast  12/11/2015  CLINICAL DATA:  Rule gross hematuria.  Urinary tract infection. EXAM: CT ABDOMEN AND PELVIS WITHOUT AND WITH CONTRAST TECHNIQUE: Multidetector CT imaging of the abdomen and pelvis was performed following the standard protocol before and following the bolus administration of intravenous contrast. CONTRAST:  125mL OMNIPAQUE IOHEXOL 300 MG/ML  SOLN COMPARISON:  Abdominal ultrasound 12/06/2015 FINDINGS: Lower chest: Small bilateral pleural effusions, RIGHT greater than LEFT. Hepatobiliary: Multiple low-density lesions in liver consistent benign cysts. There is periportal edema. No biliary duct dilatation. Gallbladder is normal. Common bile duct normal. Pancreas: Pancreas is normal. No ductal dilatation. No pancreatic inflammation. Spleen: Normal spleen Adrenals/urinary tract: There is enlargement of the LEFT adrenal gland to 24 by 27 mm. The gland is high-density on precontrast imaging and demonstrates post-contrast enhancement however, on the delayed imaging lesion has significantly  washout (66%) which is consistent with a benign adenoma. Non IV contrast images demonstrate coarse calcification the lower pole of LEFT kidney measuring 13 mm 9 mm. No ureterolithiasis. Cortical phase imaging demonstrates no enhancing cortical. Bilateral nonenhancing renal cysts Delayed pyelogram phase demonstrates no collecting systems or ureters No bladder stones or filling defects in the bladder which does not excluded a bladder lesion. There is mild trabeculation of the bladder wall. A wide-mouth diverticulum present along the posterior RIGHT aspect bladder. Stomach/Bowel: Stomach, small bowel, appendix, and cecum are normal. The colon and rectosigmoid colon are normal. Vascular/Lymphatic: Abdominal aorta is normal caliber with atherosclerotic calcification. There is no retroperitoneal or periportal lymphadenopathy. No pelvic lymphadenopathy. Reproductive: Prostate  normal. Other: Mild haziness to the mesentery. No organized fluid collections or free fluid. Musculoskeletal: No aggressive osseous lesion. IMPRESSION: 1. Coarse a calculi in the lower pole of the LEFT kidney. No ureterolithiasis. 2. No enhancing renal cortical. 3. Bilateral Bosniak 1 renal cysts. 4. No filling defects collecting systems or ureters. 5. No bladder lesion identified. There is mild trabeculation and RIGHT diverticulum. 6. Benign LEFT adrenal adenoma 7. Atherosclerotic calcification aorta. 8. Small effusions, mild portable edema, and haziness of the mesentery suggest vascular overload. Electronically Signed   By: Suzy Bouchard M.D.   On: 12/11/2015 15:44        Management plans discussed with the patient, family and they are in agreement.  CODE STATUS:     Code Status Orders        Start     Ordered   12/06/15 1644  Full code   Continuous     12/06/15 1643      TOTAL TIME TAKING CARE OF THIS PATIENT: 36 minutes.    Demetrios Loll M.D on 12/13/2015 at 12:11 PM  Between 7am to 6pm - Pager - [phone]  After 6pm go to [email or web address] - password [email or web address] [facility]  Office  [phone]  CC: Primary care physician; Annice Needy, MD

## 2015-12-13 NOTE — Care Management (Signed)
Spoke with patient who is alert and oriented. Patient does not want home health RN or PT at this time.Patient stated that he will be living with his son and his sons family while recovering.   DME walker will be provided to patient prior to discharge. No other needs.

## 2015-12-13 NOTE — Care Management Important Message (Signed)
Important Message  Patient Details  Name: Jon Smith MRN: [id number] Date of Birth: 12-11-1928   Medicare Important Message Given:  Yes    [name] A Neida Ellegood 12/13/2015, 9:08 AM

## 2015-12-13 NOTE — Care Management (Signed)
Spoke with the son Jon Smith [phone], [phone], who stated that the patient would only be able to stay with him and his wife over the weekend. Son discussed home health with patient and now patient agrees to have home health.  Patient would like provider that is in network with no co pay. Checked with [facility] , [facility], [facility], [facility], [facility]. [facility] was able to take patient in network with $0 co pay. No other [facility] providers were unable to offer either out of network or no coverage. Family accepted [facility] and expressed appreciation for services. Contact information was given to [employer] at [place]. Patient will stay with his son through the weekend Jon Smith, then return to his apartment at  [address], [place].  Gave my contact information to the son Jon Smith and instructed him to call me with any issues.

## 2015-12-15 ENCOUNTER — Telehealth: Payer: Self-pay

## 2015-12-15 NOTE — Telephone Encounter (Signed)
Home Health nurse called inquiring about hospital discharge orders. Nurse read home health nurse d/c orders. Home Health nurse voiced understanding.

## 2015-12-18 ENCOUNTER — Encounter: Payer: Self-pay | Admitting: *Deleted

## 2015-12-18 ENCOUNTER — Ambulatory Visit (INDEPENDENT_AMBULATORY_CARE_PROVIDER_SITE_OTHER): Payer: Medicare Other | Admitting: Obstetrics and Gynecology

## 2015-12-18 VITALS — BP 145/66 | HR 75 | Ht 69.0 in | Wt 138.3 lb

## 2015-12-18 DIAGNOSIS — N400 Enlarged prostate without lower urinary tract symptoms: Secondary | ICD-10-CM | POA: Diagnosis not present

## 2015-12-18 DIAGNOSIS — R310 Gross hematuria: Secondary | ICD-10-CM | POA: Diagnosis not present

## 2015-12-18 LAB — BLADDER SCAN AMB NON-IMAGING: SCAN RESULT: 212

## 2015-12-18 MED ORDER — FINASTERIDE 5 MG PO TABS
5.0000 mg | ORAL_TABLET | Freq: Every day | ORAL | Status: DC
Start: 2015-12-18 — End: 2023-04-29

## 2015-12-18 NOTE — Progress Notes (Signed)
12:52 PM   Jon Smith December 04, 1928 [id number]  Referring provider: Annice Needy, MD No address on file  Chief Complaint  Patient presents with  . Hematuria    gross was discharged from ed on 12/06/15    HPI: Patient is a 81 year old African-American male with a history of prostate cancer presenting today as a referral from his primary care provider for an elevated PSA of 5.44. Patient states that he was previously treated for his prostate cancer by Dr. Ernst Smith approximately 15-20 years ago using cryo ablation. He states that he never heard anything else from the office after his procedure and he has not followed up since. It seems that most recent PSA may be the only one checked since his cryoablation.  Patient denies frequency, urgency, bone pain or unexplained weight loss. He does report nocturia 1-2 times per night, weak stream and occasional sensation of incomplete bladder emptying. He is a current 0.5 pack per day smoker 40 years. He does report a history of kidney stones last stone episode approximately 8-9 years ago.  Current Status: Patient presents today for follow-up after recent hospital admission on 12/12/15 after suffering a fainting Smith. He was noted also have gross hematuria. He underwent a CT urogram which did not demonstrate any malignancies or metastatic disease. He was found to have coarse calcifications in his left kidney, mild bladder trabeculation and a bladder diverticulum.urology was consult while he was an inpatient. He presents today for follow-up and to schedule a cystoscopy for completion of gross hematuria evaluation.  She reports that he is overall feeling well today. He continues to experience urinary hesitancy and is having a difficult time providing urine specimen today in the office. No abdominal or flank pain. No fevers or further gross hematuria.  PMH: Past Medical History  Diagnosis Date  . Peripheral arterial disease ([place])   .  Hypertension   . Lower extremity ulceration ([place])   . Back pain   . Prostate cancer ([place]) remote    Cryotherapy by Dr. Ernst Smith.  PSA 5.54 in September at [facility]  . HLD (hyperlipidemia)   . Renal stones   . Nocturia   . Incomplete bladder emptying   . Hepatitis   . Elevated LFTs     Surgical History: Past Surgical History  Procedure Laterality Date  . Peritoneal catheter insertion    . Transluminal angioplasty    . Aortogram    . Prostate surgery      Home Medications:    Medication List       This list is accurate as of: 12/18/15 11:59 PM.  Always use your most recent med list.               atorvastatin 80 MG tablet  Commonly known as:  LIPITOR  Reported on 12/18/2015     finasteride 5 MG tablet  Commonly known as:  PROSCAR  Take 1 tablet (5 mg total) by mouth daily.     latanoprost 0.005 % ophthalmic solution  Commonly known as:  XALATAN     lisinopril 20 MG tablet  Commonly known as:  PRINIVIL,ZESTRIL     tamsulosin 0.4 MG Caps capsule  Commonly known as:  FLOMAX  Take 1 capsule (0.4 mg total) by mouth daily. At bedtime        Allergies:  Allergies  Allergen Reactions  . Aspirin Nausea And Vomiting    Family History: Family History  Problem Relation Age of Onset  . Heart disease Brother   .  Kidney Stones Sister   . Heart attack Sister   . Heart attack Brother   . Kidney disease Neg Hx   . Prostate cancer Neg Hx     Social History:  reports that he has quit smoking. His smoking use included Cigarettes. He does not have any smokeless tobacco history on file. He reports that he does not drink alcohol or use illicit drugs.  ROS: UROLOGY Frequent Urination?: No Hard to postpone urination?: Yes Burning/pain with urination?: No Get up at night to urinate?: Yes Leakage of urine?: Yes Urine stream starts and stops?: No Trouble starting stream?: No Do you have to strain to urinate?: No Blood in urine?: Yes Urinary tract infection?:  Yes Sexually transmitted disease?: No Injury to kidneys or bladder?: No Painful intercourse?: No Weak stream?: No Erection problems?: No Penile pain?: No  Gastrointestinal Nausea?: No Vomiting?: No Indigestion/heartburn?: No Diarrhea?: No Constipation?: No  Constitutional Fever: No Night sweats?: No Weight loss?: Yes Fatigue?: Yes  Skin Skin rash/lesions?: No Itching?: No  Eyes Blurred vision?: No Double vision?: No  Ears/Nose/Throat Sore throat?: No Sinus problems?: No  Hematologic/Lymphatic Swollen glands?: No Easy bruising?: No  Cardiovascular Leg swelling?: No Chest pain?: No  Respiratory Cough?: Yes Shortness of breath?: Yes  Endocrine Excessive thirst?: No  Musculoskeletal Back pain?: Yes Joint pain?: Yes  Neurological Headaches?: No Dizziness?: Yes  Psychologic Depression?: No Anxiety?: No  Physical Exam: BP 145/66 mmHg  Pulse 75  Ht 5\' 9"  (1.753 m)  Wt 138 lb 4.8 oz (62.732 kg)  BMI 20.41 kg/m2  Constitutional:  Alert and oriented, No acute distress. HEENT: [place] AT, moist mucus membranes.  Trachea midline, no masses. Cardiovascular: No clubbing, cyanosis, or edema. Respiratory: Normal respiratory effort, no increased work of breathing. GI: Abdomen is soft, nontender, nondistended, no abdominal masses GU: No CVA tenderness DRE:no clear margins palpapated, very small soft prostate Skin: No rashes, bruises or suspicious lesions. Lymph: No cervical or inguinal adenopathy. Neurologic: Grossly intact, no focal deficits, moving all 4 extremities. Psychiatric: Normal mood and affect.  Laboratory Data: Lab Results  Component Value Date   WBC 8.8 12/13/2015   HGB 8.2* 12/13/2015   HCT 24.0* 12/13/2015   MCV 82.1 12/13/2015   PLT 139* 12/13/2015    Lab Results  Component Value Date   CREATININE 1.09 12/13/2015    Lab Results  Component Value Date   PSA 5.47* 12/10/2015    Lab Results  Component Value Date   TESTOSTERONE  73* 12/10/2015    No results found for: HGBA1C  Urinalysis Results for orders placed or performed in visit on 12/18/15  Microscopic Examination  Result Value Ref Range   WBC, UA 6-10 (A) 0 -  5 /hpf   RBC, UA None seen 0 -  2 /hpf   Epithelial Cells (non renal) None seen 0 - 10 /hpf   Bacteria, UA Many (A) None seen/Few  Urinalysis, Complete  Result Value Ref Range   Specific Gravity, UA 1.015 1.005 - 1.030   pH, UA 5.5 5.0 - 7.5   Color, UA Yellow Yellow   Appearance Ur Cloudy (A) Clear   Leukocytes, UA 1+ (A) Negative   Protein, UA Negative Negative/Trace   Glucose, UA Negative Negative   Ketones, UA Negative Negative   RBC, UA Negative Negative   Bilirubin, UA Negative Negative   Urobilinogen, Ur 0.2 0.2 - 1.0 mg/dL   Nitrite, UA Positive (A) Negative   Microscopic Examination See below:   BLADDER SCAN AMB  NON-IMAGING  Result Value Ref Range   Scan Result 212     Pertinent Imaging:   Assessment & Plan:   1. Prostate Cancer-  S/p cryoablation 20 years ago by Dr. Madelin Headings.  PSA of 5.44.   Discussed case with Dr. Pilar Jarvis.  Recommedations to recheck PSA in 6 months.  If levels continue to rise we may need to consider initiation of ADT. PSA History:  09/18/15  PSA 5.44 12/10/15 PSA 5.47  - Urinalysis, Complete - BLADDER SCAN AMB NON-IMAGING  2. Incomplete Bladder emptying- Urinary complaints of weak stream and occasional sensation of incomplete bladder emptying.patient is unable to void in office today and we are not able to obtain an accurate PVR. He was started on tamsulosin and his last visit but he reports that he stopped his Flomax after recent admission because he thought it was damaging his liver. He recently restarted it after home health nurse advised him he was thinking of the wrong medication.  Patient was advised to continues tamsulosin and we will also start finasteride today.  3.  Hematuria- CT Urogram negative. Will schedule cystoscopy for completion of  hematuria work up.    Return for schedule cystoscopy.  Herbert Moors, [facility] [address] [place], [place] [address] [phone]

## 2015-12-18 NOTE — Patient Instructions (Signed)
Benign Prostatic Hyperplasia An enlarged prostate (benign prostatic hyperplasia) is common in older men. You may experience the following:  Weak urine stream.  Dribbling.  Feeling like the bladder has not emptied completely.  Difficulty starting urination.  Getting up frequently at night to urinate.  Urinating more frequently during the day. HOME CARE INSTRUCTIONS  Monitor your prostatic hyperplasia for any changes. The following actions may help to alleviate any discomfort you are experiencing:  Give yourself time when you urinate.  Stay away from alcohol.  Avoid beverages containing caffeine, such as coffee, tea, and colas, because they can make the problem worse.  Avoid decongestants, antihistamines, and some prescription medicines that can make the problem worse.  Follow up with your health care provider for further treatment as recommended. SEEK MEDICAL CARE IF:  You are experiencing progressive difficulty voiding.  Your urine stream is progressively getting narrower.  You are awaking from sleep with the urge to void more frequently.  You are constantly feeling the need to void.  You experience loss of urine, especially in small amounts. SEEK IMMEDIATE MEDICAL CARE IF:   You develop increased pain with urination or are unable to urinate.  You develop severe abdominal pain, vomiting, a high fever, or fainting.  You develop back pain or blood in your urine. MAKE SURE YOU:   Understand these instructions.  Will watch your condition.  Will get help right away if you are not doing well or get worse.   This information is not intended to replace advice given to you by your health care provider. Make sure you discuss any questions you have with your health care provider.   Document Released: 12/16/2005 Document Revised: 01/06/2015 Document Reviewed: 05/18/2013 Elsevier Interactive Patient Education 2016 Else[employer] Hematuria, Adult Hematuria is blood in your  urine. It can be caused by a bladder infection, kidney infection, prostate infection, kidney stone, or cancer of your urinary tract. Infections can usually be treated with medicine, and a kidney stone usually will pass through your urine. If neither of these is the cause of your hematuria, further workup to find out the reason may be needed. It is very important that you tell your health care provider about any blood you see in your urine, even if the blood stops without treatment or happens without causing pain. Blood in your urine that happens and then stops and then happens again can be a symptom of a very serious condition. Also, pain is not a symptom in the initial stages of many urinary cancers. HOME CARE INSTRUCTIONS   Drink lots of fluid, 3-4 quarts a day. If you have been diagnosed with an infection, cranberry juice is especially recommended, in addition to large amounts of water.  Avoid caffeine, tea, and carbonated beverages because they tend to irritate the bladder.  Avoid alcohol because it may irritate the prostate.  Take all medicines as directed by your health care provider.  If you were prescribed an antibiotic medicine, finish it all even if you start to feel better.  If you have been diagnosed with a kidney stone, follow your health care provider's instructions regarding straining your urine to catch the stone.  Empty your bladder often. Avoid holding urine for long periods of time.  After a bowel movement, women should cleanse front to back. Use each tissue only once.  Empty your bladder before and after sexual intercourse if you are a male. SEEK MEDICAL CARE IF:  You develop back pain.  You have a fever.  You have a feeling of sickness in your stomach (nausea) or vomiting.  Your symptoms are not better in 3 days. Return sooner if you are getting worse. SEEK IMMEDIATE MEDICAL CARE IF:   You develop severe vomiting and are unable to keep the medicine down.  You  develop severe back or abdominal pain despite taking your medicines.  You begin passing a large amount of blood or clots in your urine.  You feel extremely weak or faint, or you pass out. MAKE SURE YOU:   Understand these instructions.  Will watch your condition.  Will get help right away if you are not doing well or get worse.   This information is not intended to replace advice given to you by your health care provider. Make sure you discuss any questions you have with your health care provider.   Document Released: 12/16/2005 Document Revised: 01/06/2015 Document Reviewed: 08/16/2013 Elsevier Interactive Patient Education [employer]. Cystoscopy Cystoscopy is a procedure that is used to help your caregiver diagnose and sometimes treat conditions that affect your lower urinary tract. Your lower urinary tract includes your bladder and the tube through which urine passes from your bladder out of your body (urethra). Cystoscopy is performed with a thin, tube-shaped instrument (cystoscope). The cystoscope has lenses and a light at the end so that your caregiver can see inside your bladder. The cystoscope is inserted at the entrance of your urethra. Your caregiver guides it through your urethra and into your bladder. There are two main types of cystoscopy:  Flexible cystoscopy (with a flexible cystoscope).  Rigid cystoscopy (with a rigid cystoscope). Cystoscopy may be recommended for many conditions, including:  Urinary tract infections.  Blood in your urine (hematuria).  Loss of bladder control (urinary incontinence) or overactive bladder.  Unusual cells found in a urine sample.  Urinary blockage.  Painful urination. Cystoscopy may also be done to remove a sample of your tissue to be checked under a microscope (biopsy). It may also be done to remove or destroy bladder stones. LET YOUR CAREGIVER KNOW ABOUT:  Allergies to food or medicine.  Medicines taken, including  vitamins, herbs, eyedrops, over-the-counter medicines, and creams.  Use of steroids (by mouth or creams).  Previous problems with anesthetics or numbing medicines.  History of bleeding problems or blood clots.  Previous surgery.  Other health problems, including diabetes and kidney problems.  Possibility of pregnancy, if this applies. PROCEDURE The area around the opening to your urethra will be cleaned. A medicine to numb your urethra (local anesthetic) is used. If a tissue sample or stone is removed during the procedure, you may be given a medicine to make you sleep (general anesthetic). Your caregiver will gently insert the tip of the cystoscope into your urethra. The cystoscope will be slowly glided through your urethra and into your bladder. Sterile fluid will flow through the cystoscope and into your bladder. The fluid will expand and stretch your bladder. This gives your caregiver a better view of your bladder walls. The procedure lasts about 15-20 minutes. AFTER THE PROCEDURE If a local anesthetic is used, you will be allowed to go home as soon as you are ready. If a general anesthetic is used, you will be taken to a recovery area until you are stable. You may have temporary bleeding and burning on urination.   This information is not intended to replace advice given to you by your health care provider. Make sure you discuss any questions you have with your health  care provider.   Document Released: 12/13/2000 Document Revised: 01/06/2015 Document Reviewed: 06/08/2012 Elsevier Interactive Patient Education [employer].

## 2015-12-19 LAB — URINALYSIS, COMPLETE
Bilirubin, UA: NEGATIVE
Glucose, UA: NEGATIVE
KETONES UA: NEGATIVE
NITRITE UA: POSITIVE — AB
PH UA: 5.5 (ref 5.0–7.5)
PROTEIN UA: NEGATIVE
RBC UA: NEGATIVE
Specific Gravity, UA: 1.015 (ref 1.005–1.030)
UUROB: 0.2 mg/dL (ref 0.2–1.0)

## 2015-12-19 LAB — MICROSCOPIC EXAMINATION
Epithelial Cells (non renal): NONE SEEN /hpf (ref 0–10)
RBC, UA: NONE SEEN /hpf (ref 0–?)

## 2016-01-04 ENCOUNTER — Other Ambulatory Visit: Payer: Medicare Other

## 2016-01-09 ENCOUNTER — Ambulatory Visit: Payer: Medicare Other | Admitting: Urology

## 2016-01-09 VITALS — Ht 69.0 in | Wt 143.9 lb

## 2016-01-09 DIAGNOSIS — R310 Gross hematuria: Secondary | ICD-10-CM

## 2016-01-09 DIAGNOSIS — N390 Urinary tract infection, site not specified: Secondary | ICD-10-CM

## 2016-01-09 LAB — URINALYSIS, COMPLETE
BILIRUBIN UA: NEGATIVE
Glucose, UA: NEGATIVE
KETONES UA: NEGATIVE
Nitrite, UA: POSITIVE — AB
PROTEIN UA: NEGATIVE
RBC UA: NEGATIVE
SPEC GRAV UA: 1.015 (ref 1.005–1.030)
Urobilinogen, Ur: 0.2 mg/dL (ref 0.2–1.0)
pH, UA: 5.5 (ref 5.0–7.5)

## 2016-01-09 LAB — MICROSCOPIC EXAMINATION: RBC, UA: NONE SEEN /hpf (ref 0–?)

## 2016-01-09 MED ORDER — AMOXICILLIN-POT CLAVULANATE 875-125 MG PO TABS: 1.0000 | ORAL_TABLET | Freq: Two times a day (BID) | ORAL | Status: DC

## 2016-01-09 NOTE — Progress Notes (Signed)
The patient presented today for follow-up cystoscopy. His urinalysis today was concerning for an infection. He had positive nitrites with leukocyte esterase and many bacteria. In speaking with the patient, he is asymptomatic and denies any worsening voiding symptoms. He denies any ongoing gross hematuria. The patient and I had a discussion about whether or not to proceed with cystoscopy. Ultimately, we opted to put the patient on antibiotics empirically and base our treatment on his most recent urine culture which was in September 2016. This showed a multidrug resistant Escherichia coli. Our plan is to put him on Augmentin for 7 days and have the patient follow-up for cystoscopy thereafter. We will follow-up with a urine culture today.

## 2016-01-12 LAB — CULTURE, URINE COMPREHENSIVE

## 2016-01-16 ENCOUNTER — Telehealth: Payer: Self-pay | Admitting: Urology

## 2016-01-16 ENCOUNTER — Encounter: Payer: Self-pay | Admitting: Urology

## 2016-01-16 ENCOUNTER — Ambulatory Visit (INDEPENDENT_AMBULATORY_CARE_PROVIDER_SITE_OTHER): Payer: Medicare Other | Admitting: Urology

## 2016-01-16 VITALS — BP 184/68 | HR 77 | Ht 69.0 in | Wt 145.4 lb

## 2016-01-16 DIAGNOSIS — C61 Malignant neoplasm of prostate: Secondary | ICD-10-CM | POA: Diagnosis not present

## 2016-01-16 DIAGNOSIS — R972 Elevated prostate specific antigen [PSA]: Secondary | ICD-10-CM | POA: Diagnosis not present

## 2016-01-16 DIAGNOSIS — R310 Gross hematuria: Secondary | ICD-10-CM | POA: Diagnosis not present

## 2016-01-16 LAB — MICROSCOPIC EXAMINATION: BACTERIA UA: NONE SEEN

## 2016-01-16 LAB — URINALYSIS, COMPLETE
Bilirubin, UA: NEGATIVE
Glucose, UA: NEGATIVE
LEUKOCYTES UA: NEGATIVE
Nitrite, UA: NEGATIVE
PH UA: 5.5 (ref 5.0–7.5)
PROTEIN UA: NEGATIVE
SPEC GRAV UA: 1.02 (ref 1.005–1.030)
Urobilinogen, Ur: 0.2 mg/dL (ref 0.2–1.0)

## 2016-01-16 MED ORDER — LIDOCAINE HCL 2 % EX GEL
1.0000 "application " | Freq: Once | CUTANEOUS | Status: AC
  Administered 2016-01-16: 1 via URETHRAL

## 2016-01-16 MED ORDER — CIPROFLOXACIN HCL 500 MG PO TABS
500.0000 mg | ORAL_TABLET | Freq: Once | ORAL | Status: AC
  Administered 2016-01-16: 500 mg via ORAL

## 2016-01-16 NOTE — Progress Notes (Signed)
   Jon Smith is a 82yo with a hx of prostate cancer, LUTS and gross hematuria here for cystoscopy for his gross hematuria evaluation.   Cystoscopy Procedure Note  Patient identification was confirmed, informed consent was obtained, and patient was prepped using Betadine solution.  Lidocaine jelly was administered per urethral meatus.    Preoperative abx where received prior to procedure.     Pre-Procedure: - Inspection reveals a normal caliber ureteral meatus.  Procedure: The flexible cystoscope was introduced without difficulty - No urethral strictures/lesions are present. - Enlarged prostate trilobar enlargement - Elevated bladder neck - Bilateral ureteral orifices identified - Bladder mucosa  reveals no ulcers, tumors, or lesions - No bladder stones - moderate trabeculations  Retroflexion shows 1cm intravesical prostatic protrusion   Post-Procedure: - Patient tolerated the procedure well  Plan: 1. Bone scan. followup after bone scan 2. Continue flomax and finsteride

## 2016-01-16 NOTE — Telephone Encounter (Signed)
Patient called and said he did not want to have the bone scan done. I spoke with dr. Alyson Ingles and he said to just have the patient follow up in 6 months for his annual PSA w/ DRE exam.  Sharyn Lull

## 2016-02-20 ENCOUNTER — Ambulatory Visit: Payer: Medicare Other

## 2016-03-26 ENCOUNTER — Ambulatory Visit: Payer: Medicare Other | Admitting: Obstetrics and Gynecology

## 2016-03-28 ENCOUNTER — Ambulatory Visit: Payer: Medicare Other | Admitting: Obstetrics and Gynecology

## 2016-07-26 ENCOUNTER — Ambulatory Visit: Payer: Medicare Other

## 2016-08-25 ENCOUNTER — Emergency Department
Admission: EM | Admit: 2016-08-25 | Discharge: 2016-08-25 | Disposition: A | Discharge status: Home / Self Care | Payer: Medicare HMO | Attending: Student in an Organized Health Care Education/Training Program | Admitting: Student in an Organized Health Care Education/Training Program

## 2016-08-25 ENCOUNTER — Emergency Department: Payer: Medicare HMO

## 2016-08-25 DIAGNOSIS — R42 Dizziness and giddiness: Secondary | ICD-10-CM | POA: Diagnosis present

## 2016-08-25 DIAGNOSIS — Z87891 Personal history of nicotine dependence: Secondary | ICD-10-CM | POA: Insufficient documentation

## 2016-08-25 DIAGNOSIS — Z7982 Long term (current) use of aspirin: Secondary | ICD-10-CM | POA: Diagnosis not present

## 2016-08-25 DIAGNOSIS — I10 Essential (primary) hypertension: Secondary | ICD-10-CM | POA: Insufficient documentation

## 2016-08-25 DIAGNOSIS — R4182 Altered mental status, unspecified: Secondary | ICD-10-CM | POA: Insufficient documentation

## 2016-08-25 DIAGNOSIS — Z79899 Other long term (current) drug therapy: Secondary | ICD-10-CM | POA: Diagnosis not present

## 2016-08-25 DIAGNOSIS — Z8546 Personal history of malignant neoplasm of prostate: Secondary | ICD-10-CM | POA: Diagnosis not present

## 2016-08-25 DIAGNOSIS — E860 Dehydration: Secondary | ICD-10-CM | POA: Diagnosis not present

## 2016-08-25 LAB — COMPREHENSIVE METABOLIC PANEL
ALK PHOS: 72 U/L (ref 38–126)
ALT: 13 U/L — AB (ref 17–63)
AST: 20 U/L (ref 15–41)
Albumin: 3.7 g/dL (ref 3.5–5.0)
Anion gap: 6 (ref 5–15)
BUN: 23 mg/dL — AB (ref 6–20)
CHLORIDE: 108 mmol/L (ref 101–111)
CO2: 24 mmol/L (ref 22–32)
CREATININE: 1.68 mg/dL — AB (ref 0.61–1.24)
Calcium: 9.1 mg/dL (ref 8.9–10.3)
GFR calc Af Amer: 41 mL/min — ABNORMAL LOW (ref >60)
GFR, EST NON AFRICAN AMERICAN: 35 mL/min — AB (ref >60)
Glucose, Bld: 112 mg/dL — ABNORMAL HIGH (ref 65–99)
Potassium: 5.1 mmol/L (ref 3.5–5.1)
Sodium: 138 mmol/L (ref 135–145)
Total Bilirubin: 0.8 mg/dL (ref 0.3–1.2)
Total Protein: 7.6 g/dL (ref 6.5–8.1)

## 2016-08-25 LAB — CBC WITH DIFFERENTIAL/PLATELET
BASOS ABS: 0.1 10*3/uL (ref 0–0.1)
Basophils Relative: 1 %
Eosinophils Absolute: 0.1 10*3/uL (ref 0–0.7)
Eosinophils Relative: 1 %
HEMATOCRIT: 37.3 % — AB (ref 40.0–52.0)
Hemoglobin: 12.7 g/dL — ABNORMAL LOW (ref 13.0–18.0)
LYMPHS ABS: 0.9 10*3/uL — AB (ref 1.0–3.6)
LYMPHS PCT: 10 %
MCH: 30.1 pg (ref 26.0–34.0)
MCHC: 34.1 g/dL (ref 32.0–36.0)
MCV: 88.5 fL (ref 80.0–100.0)
Monocytes Absolute: 0.4 10*3/uL (ref 0.2–1.0)
Monocytes Relative: 5 %
NEUTROS ABS: 7.6 10*3/uL — AB (ref 1.4–6.5)
Neutrophils Relative %: 83 %
Platelets: 238 10*3/uL (ref 150–440)
RBC: 4.22 MIL/uL — AB (ref 4.40–5.90)
RDW: 16.4 % — ABNORMAL HIGH (ref 11.5–14.5)
WBC: 9.2 10*3/uL (ref 3.8–10.6)

## 2016-08-25 LAB — TROPONIN I

## 2016-08-25 MED ORDER — SODIUM CHLORIDE 0.9 % IV BOLUS (SEPSIS)
500.0000 mL | Freq: Once | INTRAVENOUS | Status: AC
  Administered 2016-08-25: 500 mL via INTRAVENOUS

## 2016-08-25 NOTE — ED Provider Notes (Signed)
[facility] Emergency Department Provider Note    First MD Initiated Contact with Patient 08/25/16 1237     (approximate)  I have reviewed the triage vital signs and the nursing notes.   HISTORY  Chief Complaint Dizziness    HPI Jon Smith is a 82 y.o. male who presents with 15 minutes of generalized dizziness and hot flashes occurred while patient was at church today. Patient states it is he felt overwhelming heat without any chest pain or shortness of breath. Denies any numbness or tingling. He was standing during the service and felt very lightheaded and had to sit down. Did not lose consciousness. Denies any visual disturbance. Denies any abdominal pain or nausea. Denies any recent fevers. No recent trauma. He is admitted to the hospital for a syncopal event back in December. Does not have any history of dysrhythmia or heart failure per report. Patient called EMS to the diffuse dizziness but is asymptomatic at this time.   Past Medical History:  Diagnosis Date  . Back pain   . Elevated LFTs   . Hepatitis   . HLD (hyperlipidemia)   . Hypertension   . Incomplete bladder emptying   . Lower extremity ulceration ([place])   . Nocturia   . Peripheral arterial disease ([place])   . Prostate cancer ([place]) remote   Cryotherapy by Dr. Ernst Spell.  PSA 5.54 in September at [facility]  . Renal stones     Patient Active Problem List   Diagnosis Date Noted  . Prostate cancer ([place]) 01/16/2016  . Elevated PSA 01/16/2016  . Protein-calorie malnutrition, severe 12/12/2015  . Gross hematuria   . Acute hepatitis 12/08/2015  . Elevated LFTs   . Abnormal liver function tests 12/06/2015  . H/O malignant neoplasm of prostate 09/18/2015  . Hypercholesteremia 05/17/2014  . BP (high blood pressure) 05/17/2014  . Photodermatitis due to sun 05/17/2014    Past Surgical History:  Procedure Laterality Date  . AORTOGRAM    . PERITONEAL CATHETER INSERTION    . PROSTATE SURGERY      . TRANSLUMINAL ANGIOPLASTY      Prior to Admission medications   Medication Sig Start Date End Date Taking? Authorizing Provider  amoxicillin-clavulanate (AUGMENTIN) 875-125 MG tablet Take 1 tablet by mouth every 12 (twelve) hours. Patient not taking: Reported on 01/16/2016 01/09/16   Ardis Hughs, MD  aspirin EC 81 MG tablet Take by mouth. Reported on 01/16/2016    Historical Provider, MD  finasteride (PROSCAR) 5 MG tablet Take 1 tablet (5 mg total) by mouth daily. 12/18/15   Roda Shutters, FNP  latanoprost (XALATAN) 0.005 % ophthalmic solution  09/23/15   Historical Provider, MD  lisinopril (PRINIVIL,ZESTRIL) 20 MG tablet  09/25/15   Historical Provider, MD  tamsulosin (FLOMAX) 0.4 MG CAPS capsule Take 1 capsule (0.4 mg total) by mouth daily. At bedtime 09/27/15   Roda Shutters, FNP    Allergies Aspirin; Atorvastatin; and Sulfamethoxazole-trimethoprim  Family History  Problem Relation Age of Onset  . Heart disease Brother   . Kidney Stones Sister   . Heart attack Sister   . Heart attack Brother   . Kidney disease Neg Hx   . Prostate cancer Neg Hx     Social History Social History  Substance Use Topics  . Smoking status: Former Smoker    Types: Cigarettes  . Smokeless tobacco: Not on file     Comment: quit 10 days  . Alcohol use No    Review of  Systems Patient denies headaches, rhinorrhea, blurry vision, numbness, shortness of breath, chest pain, edema, cough, abdominal pain, nausea, vomiting, diarrhea, dysuria, fevers, rashes or hallucinations unless otherwise stated above in HPI. ____________________________________________   PHYSICAL EXAM:  VITAL SIGNS: Vitals:   08/25/16 1246  BP: (!) 175/78  Pulse: 72  Resp: 16  Temp: 97.5 F (36.4 C)    Constitutional: Alert and oriented. Well appearing and in no acute distress. Eyes: Conjunctivae are normal. PERRL. EOMI. Head: Atraumatic. Nose: No congestion/rhinnorhea. Mouth/Throat: Mucous membranes are  moist.  Oropharynx non-erythematous. Neck: No stridor. Painless ROM. No cervical spine tenderness to palpation Hematological/Lymphatic/Immunilogical: No cervical lymphadenopathy. Cardiovascular: Normal rate, regular rhythm. Grossly normal heart sounds.  Good peripheral circulation. Respiratory: Normal respiratory effort.  No retractions. Lungs CTAB. Gastrointestinal: Soft and nontender. No distention. No abdominal bruits. No CVA tenderness. Genitourinary:  Musculoskeletal: No lower extremity tenderness nor edema.  No joint effusions. Neurologic:  Normal speech and language. No gross focal neurologic deficits are appreciated. No gait instability.  CN 2-12 intact, normal FNF, normal heel to shin Skin:  Skin is warm, dry and intact. No rash noted. Psychiatric: Mood and affect are normal. Speech and behavior are normal.  ____________________________________________   LABS (all labs ordered are listed, but only abnormal results are displayed)  Results for orders placed or performed during the hospital encounter of 08/25/16 (from the past 24 hour(s))  CBC with Differential/Platelet     Status: Abnormal   Collection Time: 08/25/16  1:15 PM  Result Value Ref Range   WBC 9.2 3.8 - 10.6 K/uL   RBC 4.22 (L) 4.40 - 5.90 MIL/uL   Hemoglobin 12.7 (L) 13.0 - 18.0 g/dL   HCT 37.3 (L) 40.0 - 52.0 %   MCV 88.5 80.0 - 100.0 fL   MCH 30.1 26.0 - 34.0 pg   MCHC 34.1 32.0 - 36.0 g/dL   RDW 16.4 (H) 11.5 - 14.5 %   Platelets 238 150 - 440 K/uL   Neutrophils Relative % 83 %   Neutro Abs 7.6 (H) 1.4 - 6.5 K/uL   Lymphocytes Relative 10 %   Lymphs Abs 0.9 (L) 1.0 - 3.6 K/uL   Monocytes Relative 5 %   Monocytes Absolute 0.4 0.2 - 1.0 K/uL   Eosinophils Relative 1 %   Eosinophils Absolute 0.1 0 - 0.7 K/uL   Basophils Relative 1 %   Basophils Absolute 0.1 0 - 0.1 K/uL  Comprehensive metabolic panel     Status: Abnormal   Collection Time: 08/25/16  1:15 PM  Result Value Ref Range   Sodium 138 135 - 145  mmol/L   Potassium 5.1 3.5 - 5.1 mmol/L   Chloride 108 101 - 111 mmol/L   CO2 24 22 - 32 mmol/L   Glucose, Bld 112 (H) 65 - 99 mg/dL   BUN 23 (H) 6 - 20 mg/dL   Creatinine, Ser 1.68 (H) 0.61 - 1.24 mg/dL   Calcium 9.1 8.9 - 10.3 mg/dL   Total Protein 7.6 6.5 - 8.1 g/dL   Albumin 3.7 3.5 - 5.0 g/dL   AST 20 15 - 41 U/L   ALT 13 (L) 17 - 63 U/L   Alkaline Phosphatase 72 38 - 126 U/L   Total Bilirubin 0.8 0.3 - 1.2 mg/dL   GFR calc non Af Amer 35 (L) >60 mL/min   GFR calc Af Amer 41 (L) >60 mL/min   Anion gap 6 5 - 15  Troponin I     Status: None  Collection Time: 08/25/16  1:15 PM  Result Value Ref Range   Troponin I <0.03 <0.03 ng/mL   ____________________________________________  EKG My review and personal interpretation at Time: 13:02   Indication: 13:02  Rate: 70  Rhythm: nsr0 Axis: normal Other: no acute st elevations, normal intervals ____________________________________________  RADIOLOGY  CT head with NAICA ____________________________________________   PROCEDURES  Procedure(s) performed: none    Critical Care performed: no ____________________________________________   INITIAL IMPRESSION / ASSESSMENT AND PLAN / ED COURSE  Pertinent labs & imaging results that were available during my care of the patient were reviewed by me and considered in my medical decision making (see chart for details).  DDX: CVA, dehydration, electrolyte abnormality, hypoglycemia, dysrhythmia, ACS  Najeeb Balliett is a 82 y.o. who presents to the ED with diffuse dizziness lasting roughly 15 minutes. Patient without focal neuro deficits and no LOC. No chest pain or shortness of breath. Do not feel this is consistent with ACS or cardiac etiology. EKG without evidence of acute ischemia and troponin is negative. Patient does report decreased fluid intake over the past several days and based on his symptoms and exam and do feel that dehydration may have contributed to his presentation. Do  not suspect acute infectious process.  We'll order CT imaging to evaluate for any acute intracranial abnormalities.  The patient will be placed on continuous pulse oximetry and telemetry for monitoring.  Laboratory evaluation will be sent to evaluate for the above complaints.     Clinical Course  Comment By Time  Patient reassessed remains asymptomatic. CT is without evidence of CVA. Based on presentation I do not feel this is clinically consistent with a TIA. The patient likely was dehydrated as evidenced with his mildly elevated creatinine. Patient be provided IV fluids for resuscitation and oral hydration. Merlyn Lot, MD 08/27 1428  Patient was able to tolerate PO and was able to ambulate with a steady gait. Have discussed with the patient and available family all diagnostics and treatments performed thus far and all questions were answered to the best of my ability. The patient demonstrates understanding and agreement with plan.  Merlyn Lot, MD 08/27 1455     ____________________________________________   FINAL CLINICAL IMPRESSION(S) / ED DIAGNOSES  Final diagnoses:  Dizziness  Dehydration      NEW MEDICATIONS STARTED DURING THIS VISIT:  New Prescriptions   No medications on file     Note:  This document was prepared using Dragon voice recognition software and may include unintentional dictation errors.    Merlyn Lot, MD 08/25/16 [phone]

## 2016-08-25 NOTE — ED Triage Notes (Signed)
Pt came to ED via EMS from church when he began to feel dizzy around 1130. Pt reports feeling very hot. Alert and oriented x4. Pt reports felt like he was going to pass out.

## 2016-08-25 NOTE — ED Notes (Signed)
Ambulated pt roughly 20 steps.  Pt states he uses a walker, and felt off balance without it

## 2016-08-25 NOTE — ED Notes (Signed)
Pt transported to xray 

## 2017-04-18 ENCOUNTER — Other Ambulatory Visit: Payer: Self-pay | Admitting: Orthopedic Surgery

## 2017-04-18 DIAGNOSIS — M79605 Pain in left leg: Principal | ICD-10-CM

## 2017-04-18 DIAGNOSIS — M79604 Pain in right leg: Secondary | ICD-10-CM

## 2017-04-18 DIAGNOSIS — M5416 Radiculopathy, lumbar region: Secondary | ICD-10-CM

## 2017-04-18 DIAGNOSIS — M5137 Other intervertebral disc degeneration, lumbosacral region: Secondary | ICD-10-CM

## 2017-04-23 ENCOUNTER — Ambulatory Visit
Admission: RE | Admit: 2017-04-23 | Discharge: 2017-04-23 | Disposition: A | Discharge status: Home / Self Care | Payer: Medicare HMO | Source: Ambulatory Visit | Attending: Orthopedic Surgery | Admitting: Orthopedic Surgery

## 2017-04-23 ENCOUNTER — Other Ambulatory Visit: Payer: Self-pay | Admitting: Orthopedic Surgery

## 2017-04-23 DIAGNOSIS — M79605 Pain in left leg: Principal | ICD-10-CM

## 2017-04-23 DIAGNOSIS — M5417 Radiculopathy, lumbosacral region: Secondary | ICD-10-CM | POA: Insufficient documentation

## 2017-04-23 DIAGNOSIS — M5416 Radiculopathy, lumbar region: Secondary | ICD-10-CM

## 2017-04-23 DIAGNOSIS — M5137 Other intervertebral disc degeneration, lumbosacral region: Secondary | ICD-10-CM | POA: Insufficient documentation

## 2017-04-23 DIAGNOSIS — M48061 Spinal stenosis, lumbar region without neurogenic claudication: Secondary | ICD-10-CM | POA: Diagnosis not present

## 2017-04-23 DIAGNOSIS — M51379 Other intervertebral disc degeneration, lumbosacral region without mention of lumbar back pain or lower extremity pain: Secondary | ICD-10-CM

## 2017-04-23 DIAGNOSIS — M79604 Pain in right leg: Secondary | ICD-10-CM | POA: Insufficient documentation

## 2017-04-25 ENCOUNTER — Ambulatory Visit: Payer: Medicare HMO

## 2017-07-01 DIAGNOSIS — M5030 Other cervical disc degeneration, unspecified cervical region: Secondary | ICD-10-CM | POA: Insufficient documentation

## 2017-07-01 DIAGNOSIS — M542 Cervicalgia: Secondary | ICD-10-CM | POA: Insufficient documentation

## 2017-08-27 DIAGNOSIS — R809 Proteinuria, unspecified: Secondary | ICD-10-CM | POA: Insufficient documentation

## 2018-04-03 ENCOUNTER — Encounter: Payer: Self-pay | Admitting: Urology

## 2018-04-03 ENCOUNTER — Ambulatory Visit (INDEPENDENT_AMBULATORY_CARE_PROVIDER_SITE_OTHER): Payer: Medicare HMO | Admitting: Urology

## 2018-04-03 VITALS — BP 137/77 | HR 78 | Ht 69.0 in | Wt 141.0 lb

## 2018-04-03 DIAGNOSIS — N200 Calculus of kidney: Secondary | ICD-10-CM | POA: Diagnosis not present

## 2018-04-03 DIAGNOSIS — N400 Enlarged prostate without lower urinary tract symptoms: Secondary | ICD-10-CM | POA: Diagnosis not present

## 2018-04-03 DIAGNOSIS — Z8546 Personal history of malignant neoplasm of prostate: Secondary | ICD-10-CM | POA: Diagnosis not present

## 2018-04-03 NOTE — Progress Notes (Signed)
04/03/2018 6:54 PM   Jon Smith [birth year] [id number]  Referring provider: Annice Needy, MD [place], [place] [address]  Chief Complaint  Patient presents with  . Elevated PSA    HPI: 84 yo M referred back to [facility] for history of prostate cancer.  He was last seen in our office in 2017 for cystoscopy.   He has a personal history of prostate cancer s/p cryotherapy by Dr. Ernst Spell >20 years ago in the 34s.   He has had two PSA values in the recent past measuring 5.44 on 08/2015 and more recently 6.39 on 01/2018.    In light of unknown baseline PSA in 2016, bone scan was recommended by he declined.  He does have personal history of gross hematuria s/p cystoscopy 2017 indicating large median lobe.  He also had CT Urogram in 11/2015 showing non obstructing kidney stones on left, bladder trabeculation and right bladder diverticulum.  He denies any baseline urinary symptoms today including urgency, frequency, hematuria, dysuria, sensation on incomplete emptying or straining.   He is chronically on finsteride and flomax.    No weight loss or bone pain.    Baseline CKD, Cr stable at 1.5 for many years.   PMH: Past Medical History:  Diagnosis Date  . Back pain   . Elevated LFTs   . Hepatitis   . HLD (hyperlipidemia)   . Hypertension   . Incomplete bladder emptying   . Lower extremity ulceration ([facility])   . Nocturia   . Peripheral arterial disease ([facility])   . Prostate cancer ([place]) remote   Cryotherapy by Dr. Ernst Spell.  PSA 5.54 in September at [facility]  . Renal stones     Surgical History: Past Surgical History:  Procedure Laterality Date  . AORTOGRAM    . PERITONEAL CATHETER INSERTION    . PROSTATE SURGERY    . TRANSLUMINAL ANGIOPLASTY      Home Medications:  Allergies as of 04/03/2018      Reactions   Aspirin Nausea And Vomiting   Atorvastatin    Other reaction(s): Liver Disorder Possible cause of hepatitis   Sulfamethoxazole-trimethoprim    Other reaction(s): Blood Disorder Coagulopathy      Medication List        Accurate as of 04/03/18 11:59 PM. Always use your most recent med list.          aspirin EC 81 MG tablet Take by mouth. Reported on 01/16/2016   finasteride 5 MG tablet Commonly known as:  PROSCAR Take 1 tablet (5 mg total) by mouth daily.   latanoprost 0.005 % ophthalmic solution Commonly known as:  XALATAN   lisinopril 20 MG tablet Commonly known as:  PRINIVIL,ZESTRIL   tamsulosin 0.4 MG Caps capsule Commonly known as:  FLOMAX Take 1 capsule (0.4 mg total) by mouth daily. At bedtime       Allergies:  Allergies  Allergen Reactions  . Aspirin Nausea And Vomiting  . Atorvastatin     Other reaction(s): Liver Disorder Possible cause of hepatitis  . Sulfamethoxazole-Trimethoprim     Other reaction(s): Blood Disorder Coagulopathy    Family History: Family History  Problem Relation Age of Onset  . Heart disease Brother   . Kidney Stones Sister   . Heart attack Sister   . Heart attack Brother   . Kidney disease Neg Hx   . Prostate cancer Neg Hx     Social History:  reports that he has quit smoking. His smoking use  included cigarettes. He has never used smokeless tobacco. He reports that he does not drink alcohol or use drugs.  ROS: UROLOGY Frequent Urination?: No Hard to postpone urination?: No Burning/pain with urination?: No Get up at night to urinate?: No Leakage of urine?: No Urine stream starts and stops?: No Trouble starting stream?: No Do you have to strain to urinate?: No Blood in urine?: No Urinary tract infection?: No Sexually transmitted disease?: No Injury to kidneys or bladder?: No Painful intercourse?: No Weak stream?: No Erection problems?: No Penile pain?: No  Gastrointestinal Nausea?: No Vomiting?: No Indigestion/heartburn?: No Diarrhea?: No Constipation?: No  Constitutional Fever: No Night sweats?: No Weight loss?:  No Fatigue?: No  Skin Skin rash/lesions?: No Itching?: No  Eyes Blurred vision?: No Double vision?: No  Ears/Nose/Throat Sore throat?: No Sinus problems?: No  Hematologic/Lymphatic Swollen glands?: No Easy bruising?: No  Cardiovascular Leg swelling?: No Chest pain?: No  Respiratory Cough?: No Shortness of breath?: No  Endocrine Excessive thirst?: No  Musculoskeletal Back pain?: No Joint pain?: No  Neurological Headaches?: No Dizziness?: No  Psychologic Depression?: No Anxiety?: No  Physical Exam: BP 137/77   Pulse 78   Ht 5\' 9"  (1.753 m)   Wt 141 lb (64 kg)   BMI 20.82 kg/m   Constitutional:  Alert and oriented, No acute distress.  Appears slightly younger than stated age.   HEENT: [place] AT, moist mucus membranes.  Trachea midline, no masses. Cardiovascular: No clubbing, cyanosis, or edema. Respiratory: Normal respiratory effort, no increased work of breathing. GI: Abdomen is soft, nontender, nondistended, no abdominal masses GU: No CVA tenderness Rectal: Enlarged prostate 50+ cc, diffusely firm without  nodules Skin: No rashes, bruises or suspicious lesions. Neurologic: Grossly intact, no focal deficits, moving all 4 extremities. Psychiatric: Normal mood and affect.  Laboratory Data:  PSA 5.44 on 08/2015 PSA 6.39 on 02/25/2018  Cr 1.5 02/25/2018  Urinalysis N/a  Pertinent Imaging: n/a  Assessment & Plan:    1. History of prostate cancer Remote personal history of prostate cancer PSA baseline following cryotherapy unknown Given that its been >20 years since treatment and extremely slow rise in PSA over past 3 years less than 1 mg/ dL, suspect benign rise secondarily to underlying BPH tissue rather than aggressive cancer Given his age, recommend deferring any further work up or imaging as treatment would only be warranted for aggressive metastatic diease or symptomatic diease which is not currently the case or suspected (in the form of ADT which  would impact quality of life) He is aggreeable with this plan  2. Benign prostatic hyperplasia without lower urinary tract symptoms Doing well on finasteride and flomax Continue indefinitely Sequela of enlarged prostate with BOO on CT scan (trabecullation with diverticulum) but otherwise minimally symptomatic  3. Kidney stones Asymptomatic Defer further imaging as poor surgical candidate given age  F/u prn  Hollice Espy, MD  [facility] [address] [place],  [address] [phone]

## 2018-04-28 DIAGNOSIS — S0003XA Contusion of scalp, initial encounter: Secondary | ICD-10-CM | POA: Insufficient documentation

## 2019-02-21 DIAGNOSIS — Z79899 Other long term (current) drug therapy: Secondary | ICD-10-CM | POA: Insufficient documentation

## 2019-02-21 DIAGNOSIS — D649 Anemia, unspecified: Secondary | ICD-10-CM | POA: Insufficient documentation

## 2019-02-22 DIAGNOSIS — R809 Proteinuria, unspecified: Secondary | ICD-10-CM | POA: Insufficient documentation

## 2019-02-22 DIAGNOSIS — E559 Vitamin D deficiency, unspecified: Secondary | ICD-10-CM | POA: Insufficient documentation

## 2019-08-18 DIAGNOSIS — M6281 Muscle weakness (generalized): Secondary | ICD-10-CM | POA: Insufficient documentation

## 2019-08-20 DIAGNOSIS — E538 Deficiency of other specified B group vitamins: Secondary | ICD-10-CM | POA: Insufficient documentation

## 2019-08-25 ENCOUNTER — Telehealth: Payer: Self-pay | Admitting: Urology

## 2019-08-25 NOTE — Telephone Encounter (Signed)
We received your referral for this patient for elevated PSA and h/o prostate cancer.  Patient is established with our office and was last seen by Dr. Erlene Quan on 04/03/2018.  His PSA is stable and her recommendation at that visit was for him to follow up PRN.  I read through your referral and I do not see anything new with patient's urological symptoms.  I will route Dr. Cherrie Gauze last office visit note for you to review.  Do you have any new or additional concerns? If so, I would be happy to schedule an appointment for him to see Dr. Erlene Quan in our [facility] location.  Thank you, Lattie Haw

## 2019-09-07 ENCOUNTER — Ambulatory Visit: Payer: Medicare HMO | Admitting: Urology

## 2019-09-10 ENCOUNTER — Ambulatory Visit: Payer: Medicare HMO | Admitting: Urology

## 2019-09-17 ENCOUNTER — Other Ambulatory Visit
Admission: RE | Admit: 2019-09-17 | Discharge: 2019-09-17 | Disposition: A | Discharge status: Home / Self Care | Payer: Medicare HMO | Attending: Urology | Admitting: Urology

## 2019-09-17 ENCOUNTER — Ambulatory Visit (INDEPENDENT_AMBULATORY_CARE_PROVIDER_SITE_OTHER): Payer: Medicare HMO | Admitting: Urology

## 2019-09-17 ENCOUNTER — Encounter: Payer: Self-pay | Admitting: Urology

## 2019-09-17 ENCOUNTER — Other Ambulatory Visit: Payer: Self-pay

## 2019-09-17 VITALS — BP 149/68 | HR 70 | Ht 69.0 in | Wt 144.0 lb

## 2019-09-17 DIAGNOSIS — N401 Enlarged prostate with lower urinary tract symptoms: Secondary | ICD-10-CM

## 2019-09-17 DIAGNOSIS — N138 Other obstructive and reflux uropathy: Secondary | ICD-10-CM | POA: Diagnosis not present

## 2019-09-17 DIAGNOSIS — R972 Elevated prostate specific antigen [PSA]: Secondary | ICD-10-CM | POA: Diagnosis not present

## 2019-09-17 DIAGNOSIS — Z8546 Personal history of malignant neoplasm of prostate: Secondary | ICD-10-CM

## 2019-09-17 LAB — URINALYSIS, COMPLETE (UACMP) WITH MICROSCOPIC
Bilirubin Urine: NEGATIVE
Glucose, UA: NEGATIVE mg/dL
Ketones, ur: NEGATIVE mg/dL
Nitrite: NEGATIVE
Protein, ur: NEGATIVE mg/dL
Specific Gravity, Urine: 1.015 (ref 1.005–1.030)
WBC, UA: >50 WBC/hpf (ref 0–5)
pH: 5 (ref 5.0–8.0)

## 2019-09-17 LAB — BLADDER SCAN AMB NON-IMAGING

## 2019-09-17 NOTE — Addendum Note (Signed)
Addended by: Tommy Rainwater on: 09/17/2019 02:28 PM   Modules accepted: Orders
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
## 2019-09-18 LAB — PSA: Prostatic Specific Antigen: 10.51 ng/mL — ABNORMAL HIGH (ref 0.00–4.00)

## 2019-09-20 ENCOUNTER — Telehealth: Payer: Self-pay

## 2019-09-20 DIAGNOSIS — B9620 Unspecified Escherichia coli [E. coli] as the cause of diseases classified elsewhere: Secondary | ICD-10-CM

## 2019-09-20 LAB — URINE CULTURE: Culture: >=100000 — AB

## 2019-09-20 MED ORDER — CEPHALEXIN 500 MG PO CAPS
500.0000 mg | ORAL_CAPSULE | Freq: Three times a day (TID) | ORAL | 0 refills | Status: AC
Start: 2019-09-20 — End: 2019-09-27

## 2019-09-20 NOTE — Telephone Encounter (Signed)
Called pt informed him of the information below. Pt gave verbal understanding. Also called son and LM informing him of information as well. RX sent. Appt scheduled.

## 2019-09-20 NOTE — Telephone Encounter (Signed)
-----   Message from Hollice Espy, MD sent at 09/20/2019  4:39 PM EDT ----- Please let this patient know that he is growing bacteria in his urine, E. coli.  This is most common type of UTI bacteria.  This may be a possible explanation why his PSA has jumped so significantly over the past year.  PSA could rise with infection.  I would like to treat this with Keflex 500 mg 3 times a day for a week.  I would also like him to recheck his PSA with a lab visit in about 3 months.  It would also be helpful to get a urine at this 16-month visit to ensure that his infection is cleared at the time that it is rechecked.  Hollice Espy, MD

## 2019-12-21 ENCOUNTER — Ambulatory Visit: Payer: Self-pay | Admitting: Physician Assistant

## 2019-12-21 ENCOUNTER — Ambulatory Visit: Payer: Self-pay | Admitting: Urology

## 2019-12-21 ENCOUNTER — Encounter: Payer: Self-pay | Admitting: Physician Assistant

## 2020-02-01 ENCOUNTER — Other Ambulatory Visit: Payer: Self-pay

## 2020-02-01 DIAGNOSIS — Z87440 Personal history of urinary (tract) infections: Secondary | ICD-10-CM

## 2020-02-04 ENCOUNTER — Ambulatory Visit: Payer: Medicare HMO | Admitting: Urology

## 2020-03-03 ENCOUNTER — Ambulatory Visit: Payer: Medicare HMO | Admitting: Urology

## 2020-03-14 ENCOUNTER — Other Ambulatory Visit: Payer: Self-pay

## 2020-03-14 DIAGNOSIS — Z87440 Personal history of urinary (tract) infections: Secondary | ICD-10-CM

## 2020-03-17 ENCOUNTER — Ambulatory Visit: Payer: Medicare HMO | Admitting: Urology

## 2020-04-02 ENCOUNTER — Other Ambulatory Visit: Payer: Self-pay

## 2020-04-02 ENCOUNTER — Emergency Department
Admission: EM | Admit: 2020-04-02 | Discharge: 2020-04-02 | Disposition: A | Discharge status: Home / Self Care | Payer: Medicare HMO | Attending: Emergency Medicine | Admitting: Emergency Medicine

## 2020-04-02 DIAGNOSIS — Z7982 Long term (current) use of aspirin: Secondary | ICD-10-CM | POA: Insufficient documentation

## 2020-04-02 DIAGNOSIS — I10 Essential (primary) hypertension: Secondary | ICD-10-CM | POA: Insufficient documentation

## 2020-04-02 DIAGNOSIS — Z87891 Personal history of nicotine dependence: Secondary | ICD-10-CM | POA: Diagnosis not present

## 2020-04-02 DIAGNOSIS — R55 Syncope and collapse: Secondary | ICD-10-CM | POA: Insufficient documentation

## 2020-04-02 DIAGNOSIS — Z79899 Other long term (current) drug therapy: Secondary | ICD-10-CM | POA: Diagnosis not present

## 2020-04-02 DIAGNOSIS — Z8546 Personal history of malignant neoplasm of prostate: Secondary | ICD-10-CM | POA: Diagnosis not present

## 2020-04-02 LAB — BASIC METABOLIC PANEL
Anion gap: 10 (ref 5–15)
BUN: 38 mg/dL — ABNORMAL HIGH (ref 8–23)
CO2: 22 mmol/L (ref 22–32)
Calcium: 9.7 mg/dL (ref 8.9–10.3)
Chloride: 104 mmol/L (ref 98–111)
Creatinine, Ser: 1.79 mg/dL — ABNORMAL HIGH (ref 0.61–1.24)
GFR calc Af Amer: 38 mL/min — ABNORMAL LOW (ref >60)
GFR calc non Af Amer: 33 mL/min — ABNORMAL LOW (ref >60)
Glucose, Bld: 96 mg/dL (ref 70–99)
Potassium: 4.5 mmol/L (ref 3.5–5.1)
Sodium: 136 mmol/L (ref 135–145)

## 2020-04-02 LAB — TROPONIN I (HIGH SENSITIVITY)
Troponin I (High Sensitivity): 9 ng/L (ref <18)
Troponin I (High Sensitivity): 13 ng/L (ref <18)

## 2020-04-02 LAB — CBC WITH DIFFERENTIAL/PLATELET
Abs Immature Granulocytes: 0.01 10*3/uL (ref 0.00–0.07)
Basophils Absolute: 0.1 10*3/uL (ref 0.0–0.1)
Basophils Relative: 1 %
Eosinophils Absolute: 0.1 10*3/uL (ref 0.0–0.5)
Eosinophils Relative: 1 %
HCT: 38.2 % — ABNORMAL LOW (ref 39.0–52.0)
Hemoglobin: 12.3 g/dL — ABNORMAL LOW (ref 13.0–17.0)
Immature Granulocytes: 0 %
Lymphocytes Relative: 25 %
Lymphs Abs: 1.7 10*3/uL (ref 0.7–4.0)
MCH: 29.2 pg (ref 26.0–34.0)
MCHC: 32.2 g/dL (ref 30.0–36.0)
MCV: 90.7 fL (ref 80.0–100.0)
Monocytes Absolute: 0.5 10*3/uL (ref 0.1–1.0)
Monocytes Relative: 7 %
Neutro Abs: 4.4 10*3/uL (ref 1.7–7.7)
Neutrophils Relative %: 66 %
Platelets: 229 10*3/uL (ref 150–400)
RBC: 4.21 MIL/uL — ABNORMAL LOW (ref 4.22–5.81)
RDW: 16.1 % — ABNORMAL HIGH (ref 11.5–15.5)
WBC: 6.8 10*3/uL (ref 4.0–10.5)
nRBC: 0 % (ref 0.0–0.2)

## 2020-04-02 NOTE — ED Triage Notes (Signed)
Pt states he was at [facility] and c/o dizziness while sitting, states family called EMS for him. Pt denies pain, CP, SOB, weakness at this time. Per ems, BGL 88.   Pt aox4, nad noted. PERRLA 69mm bilaterally. Pt moves all four extremities.

## 2020-04-02 NOTE — ED Notes (Signed)
Son in room with pt. Pt wanting to know when he can leave, update provided regarding labs. MD notified.

## 2020-04-02 NOTE — ED Provider Notes (Signed)
[facility] Emergency Department Provider Note   ____________________________________________   I have reviewed the triage vital signs and the nursing notes.   HISTORY  Chief Complaint None  History limited by: Not Limited   HPI Jon Smith is a 86 y.o. male who presents to the emergency department today via EMS from a [place]. The patient states that his friends were concerned because he looked to be passing out.  The patient however denies noticing anything different.  He denies any symptoms to me.  He states he did not feel like he was about to pass out.  Also states he does not think he passed out.  Denies any chest pain or shortness of breath.  Patient denies any headaches.   Records reviewed. Per medical record review patient has a history of HLD, HTN.   Past Medical History:  Diagnosis Date  . Back pain   . Elevated LFTs   . Hepatitis   . HLD (hyperlipidemia)   . Hypertension   . Incomplete bladder emptying   . Lower extremity ulceration ([place])   . Nocturia   . Peripheral arterial disease ([place])   . Prostate cancer ([place]) remote   Cryotherapy by Dr. Ernst Spell.  PSA 5.54 in September at [facility]  . Renal stones     Patient Active Problem List   Diagnosis Date Noted  . Prostate cancer ([place]) 01/16/2016  . Elevated PSA 01/16/2016  . Protein-calorie malnutrition, severe 12/12/2015  . Gross hematuria   . Acute hepatitis 12/08/2015  . Elevated LFTs   . Abnormal liver function tests 12/06/2015  . H/O malignant neoplasm of prostate 09/18/2015  . Hypercholesteremia 05/17/2014  . BP (high blood pressure) 05/17/2014  . Photodermatitis due to sun 05/17/2014    Past Surgical History:  Procedure Laterality Date  . AORTOGRAM    . PERITONEAL CATHETER INSERTION    . PROSTATE SURGERY    . TRANSLUMINAL ANGIOPLASTY      Prior to Admission medications   Medication Sig Start Date End Date Taking? Authorizing Provider  aspirin EC 81 MG tablet Take by mouth.  Reported on 01/16/2016    [provider]  Cyanocobalamin (VITAMIN B-12 CR) 1000 MCG TBCR TAKE 2 TABLETS BY MOUTH DAILY FOR 2 WEEKS THEN REDUCE TO TAKE 1 TABLET BY MOUTH ONCE DAILY THEREAFTER FOR VITAMIN B12 DEFICIENCY. 08/25/19   [provider]  finasteride (PROSCAR) 5 MG tablet Take 1 tablet (5 mg total) by mouth daily. 12/18/15   Roda Shutters, FNP  latanoprost (XALATAN) 0.005 % ophthalmic solution  09/23/15   [provider]  lisinopril (PRINIVIL,ZESTRIL) 20 MG tablet  09/25/15   [provider]  tamsulosin (FLOMAX) 0.4 MG CAPS capsule Take 1 capsule (0.4 mg total) by mouth daily. At bedtime 09/27/15   Roda Shutters, FNP  Vitamin D, Cholecalciferol, 50 MCG (2000 UT) CAPS TAKE 3 CAPSULES BY MOUTH ONCE DAILY FOR 3 MONTHS THEN REDUCE TO TAKE 1 CAPSULE THEREAFTER FOR VITAMIN D DEFICIENCY. 08/25/19   [provider]    Allergies Aspirin, Atorvastatin, and Sulfamethoxazole-trimethoprim  Family History  Problem Relation Age of Onset  . Heart disease Brother   . Kidney Stones Sister   . Heart attack Sister   . Heart attack Brother   . Kidney disease Neg Hx   . Prostate cancer Neg Hx     Social History Social History   Tobacco Use  . Smoking status: Former Smoker    Types: Cigarettes  . Smokeless tobacco: Never Used  .  Tobacco comment: quit 10 days  Substance Use Topics  . Alcohol use: No    Alcohol/week: 0.0 standard drinks  . Drug use: No    Review of Systems Constitutional: No fever/chills Eyes: No visual changes. ENT: No sore throat. Cardiovascular: Denies chest pain. Respiratory: Denies shortness of breath. Gastrointestinal: No abdominal pain.  No nausea, no vomiting.  No diarrhea.   Genitourinary: Negative for dysuria. Musculoskeletal: Negative for back pain. Skin: Negative for rash. Neurological: Negative for headaches, focal weakness or numbness.  ____________________________________________   PHYSICAL  EXAM:  VITAL SIGNS: ED Triage Vitals  Enc Vitals Group     BP 04/02/20 1842 (!) 146/62     Pulse Rate 04/02/20 1842 68     Resp 04/02/20 1842 18     Temp 04/02/20 1842 98.5 F (36.9 C)     Temp Source 04/02/20 1842 Oral     SpO2 04/02/20 1842 100 %     Weight 04/02/20 1844 148 lb (67.1 kg)     Height 04/02/20 1844 5\' 9"  (1.753 m)     Head Circumference --      Peak Flow --      Pain Score 04/02/20 1844 0   Constitutional: Alert and oriented.  Eyes: Conjunctivae are normal.  ENT      Head: Normocephalic and atraumatic.      Nose: No congestion/rhinnorhea.      Mouth/Throat: Mucous membranes are moist.      Neck: No stridor. Hematological/Lymphatic/Immunilogical: No cervical lymphadenopathy. Cardiovascular: Normal rate, regular rhythm.  No murmurs, rubs, or gallops. Respiratory: Normal respiratory effort without tachypnea nor retractions. Breath sounds are clear and equal bilaterally. No wheezes/rales/rhonchi. Gastrointestinal: Soft and non tender. No rebound. No guarding.  Genitourinary: Deferred Musculoskeletal: Normal range of motion in all extremities. No lower extremity edema. Neurologic:  Normal speech and language. No gross focal neurologic deficits are appreciated.  Skin:  Skin is warm, dry and intact. No rash noted. Psychiatric: Mood and affect are normal. Speech and behavior are normal. Patient exhibits appropriate insight and judgment.  ____________________________________________    LABS (pertinent positives/negatives)  Trop hs 9 > 13 BMP wnl except BUn 38, cr 1.79 CBC wbc 6.8, hgb 12.3, plt 229  ____________________________________________   EKG  I, Nance Pear, attending physician, personally viewed and interpreted this EKG  EKG Time: 1930 Rate: 71 Rhythm: sinus rhythm Axis: normal Intervals: qtc 455 QRS: LVH ST changes: no st elevation Impression: abnormal ekg  ____________________________________________     RADIOLOGY  None  ____________________________________________   PROCEDURES  Procedures  ____________________________________________   INITIAL IMPRESSION / ASSESSMENT AND PLAN / ED COURSE  Pertinent labs & imaging results that were available during my care of the patient were reviewed by me and considered in my medical decision making (see chart for details).   Patient presented to the emergency department today because of concerns for a near syncopal episode.  Patient himself denies noticing that anything was different.  He denies any chest pain or palpitations.  EKG without concerning arrhythmia.  No ST elevation.  Patient's blood work without any significant anemia or electrolyte abnormality.  Troponin was negative x2.  Patient without any abnormal monitor findings while here in the emergency department.  He denies any return of his symptoms.  ____________________________________________   FINAL CLINICAL IMPRESSION(S) / ED DIAGNOSES  Final diagnoses:  Near syncope     Note: This dictation was prepared with Dragon dictation. Any transcriptional errors that result from this process are unintentional  Nance Pear, MD 04/02/20 [phone]

## 2020-04-02 NOTE — ED Notes (Signed)
Pt reports he has had the COVID vaccine.

## 2020-04-02 NOTE — Discharge Instructions (Addendum)
Please seek medical attention for any high fevers, chest pain, shortness of breath, change in behavior, persistent vomiting, bloody stool or any other new or concerning symptoms.  

## 2020-04-02 NOTE — ED Notes (Signed)
Pt provided urinal.

## 2020-04-02 NOTE — ED Notes (Signed)
[place] sent to lab

## 2020-04-13 ENCOUNTER — Other Ambulatory Visit: Payer: Self-pay

## 2020-04-13 DIAGNOSIS — Z87440 Personal history of urinary (tract) infections: Secondary | ICD-10-CM
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
## 2020-04-14 ENCOUNTER — Other Ambulatory Visit: Payer: Self-pay

## 2020-04-14 ENCOUNTER — Other Ambulatory Visit
Admission: RE | Admit: 2020-04-14 | Discharge: 2020-04-14 | Disposition: A | Discharge status: Home / Self Care | Payer: Medicare HMO | Attending: Urology | Admitting: Urology

## 2020-04-14 ENCOUNTER — Ambulatory Visit (INDEPENDENT_AMBULATORY_CARE_PROVIDER_SITE_OTHER): Payer: Medicare HMO | Admitting: Urology

## 2020-04-14 ENCOUNTER — Encounter: Payer: Self-pay | Admitting: Urology

## 2020-04-14 VITALS — BP 169/71 | HR 61 | Ht 69.0 in | Wt 147.0 lb

## 2020-04-14 DIAGNOSIS — Z8546 Personal history of malignant neoplasm of prostate: Secondary | ICD-10-CM | POA: Insufficient documentation

## 2020-04-14 DIAGNOSIS — Z87440 Personal history of urinary (tract) infections: Secondary | ICD-10-CM | POA: Insufficient documentation

## 2020-04-14 LAB — URINALYSIS, COMPLETE (UACMP) WITH MICROSCOPIC
Bilirubin Urine: NEGATIVE
Glucose, UA: NEGATIVE mg/dL
Ketones, ur: NEGATIVE mg/dL
Nitrite: NEGATIVE
Protein, ur: NEGATIVE mg/dL
Specific Gravity, Urine: 1.02 (ref 1.005–1.030)
WBC, UA: >50 WBC/hpf (ref 0–5)
pH: 5 (ref 5.0–8.0)

## 2020-04-14 LAB — PSA: Prostatic Specific Antigen: 9.95 ng/mL — ABNORMAL HIGH (ref 0.00–4.00)

## 2020-04-17 ENCOUNTER — Telehealth: Payer: Self-pay | Admitting: *Deleted

## 2020-04-17 LAB — URINE CULTURE: Culture: >=100000 — AB

## 2020-04-17 NOTE — Telephone Encounter (Addendum)
Patient informed and patient's son. Voiced understanding.   ----- Message from Hollice Espy, MD sent at 04/15/2020  1:48 PM EDT ----- Please let this patient's son know that his PSA is stable.  Recommend continued observation, PSA again in 6 months and reassess for any voiding issues.    Hollice Espy, MD

## 2020-05-18 DIAGNOSIS — H903 Sensorineural hearing loss, bilateral: Secondary | ICD-10-CM | POA: Diagnosis not present

## 2020-05-19 DIAGNOSIS — H6123 Impacted cerumen, bilateral: Secondary | ICD-10-CM | POA: Diagnosis not present

## 2020-06-01 DIAGNOSIS — H401130 Primary open-angle glaucoma, bilateral, stage unspecified: Secondary | ICD-10-CM | POA: Diagnosis not present

## 2020-06-08 DIAGNOSIS — H903 Sensorineural hearing loss, bilateral: Secondary | ICD-10-CM | POA: Diagnosis not present

## 2020-07-18 DIAGNOSIS — R7309 Other abnormal glucose: Secondary | ICD-10-CM | POA: Diagnosis not present

## 2020-07-18 DIAGNOSIS — M6281 Muscle weakness (generalized): Secondary | ICD-10-CM | POA: Diagnosis not present

## 2020-07-18 DIAGNOSIS — E7800 Pure hypercholesterolemia, unspecified: Secondary | ICD-10-CM | POA: Diagnosis not present

## 2020-07-18 DIAGNOSIS — D649 Anemia, unspecified: Secondary | ICD-10-CM | POA: Diagnosis not present

## 2020-07-18 DIAGNOSIS — E538 Deficiency of other specified B group vitamins: Secondary | ICD-10-CM | POA: Diagnosis not present

## 2020-07-18 DIAGNOSIS — I150 Renovascular hypertension: Secondary | ICD-10-CM | POA: Diagnosis not present

## 2020-07-18 DIAGNOSIS — Z1329 Encounter for screening for other suspected endocrine disorder: Secondary | ICD-10-CM | POA: Diagnosis not present

## 2020-07-18 DIAGNOSIS — N1831 Chronic kidney disease, stage 3a: Secondary | ICD-10-CM | POA: Diagnosis not present

## 2020-07-18 DIAGNOSIS — E559 Vitamin D deficiency, unspecified: Secondary | ICD-10-CM | POA: Diagnosis not present

## 2020-07-18 DIAGNOSIS — R972 Elevated prostate specific antigen [PSA]: Secondary | ICD-10-CM | POA: Diagnosis not present

## 2020-07-24 DIAGNOSIS — S299XXA Unspecified injury of thorax, initial encounter: Secondary | ICD-10-CM | POA: Diagnosis not present

## 2020-07-24 DIAGNOSIS — Z20822 Contact with and (suspected) exposure to covid-19: Secondary | ICD-10-CM | POA: Diagnosis not present

## 2020-07-24 DIAGNOSIS — S0990XA Unspecified injury of head, initial encounter: Secondary | ICD-10-CM | POA: Diagnosis not present

## 2020-07-24 DIAGNOSIS — G9389 Other specified disorders of brain: Secondary | ICD-10-CM | POA: Diagnosis not present

## 2020-07-24 DIAGNOSIS — R748 Abnormal levels of other serum enzymes: Secondary | ICD-10-CM | POA: Diagnosis not present

## 2020-07-24 DIAGNOSIS — I517 Cardiomegaly: Secondary | ICD-10-CM | POA: Diagnosis not present

## 2020-07-24 DIAGNOSIS — R519 Headache, unspecified: Secondary | ICD-10-CM | POA: Diagnosis not present

## 2020-07-24 DIAGNOSIS — I4949 Other premature depolarization: Secondary | ICD-10-CM | POA: Diagnosis not present

## 2020-07-24 DIAGNOSIS — S199XXA Unspecified injury of neck, initial encounter: Secondary | ICD-10-CM | POA: Diagnosis not present

## 2020-07-24 DIAGNOSIS — M1611 Unilateral primary osteoarthritis, right hip: Secondary | ICD-10-CM | POA: Diagnosis not present

## 2020-07-24 DIAGNOSIS — S3993XA Unspecified injury of pelvis, initial encounter: Secondary | ICD-10-CM | POA: Diagnosis not present

## 2020-07-24 DIAGNOSIS — N179 Acute kidney failure, unspecified: Secondary | ICD-10-CM | POA: Diagnosis not present

## 2020-07-24 DIAGNOSIS — R778 Other specified abnormalities of plasma proteins: Secondary | ICD-10-CM | POA: Diagnosis not present

## 2020-07-25 DIAGNOSIS — R9431 Abnormal electrocardiogram [ECG] [EKG]: Secondary | ICD-10-CM | POA: Diagnosis not present

## 2020-07-25 DIAGNOSIS — R69 Illness, unspecified: Secondary | ICD-10-CM | POA: Diagnosis not present

## 2020-07-25 DIAGNOSIS — E875 Hyperkalemia: Secondary | ICD-10-CM | POA: Insufficient documentation

## 2020-07-25 DIAGNOSIS — I129 Hypertensive chronic kidney disease with stage 1 through stage 4 chronic kidney disease, or unspecified chronic kidney disease: Secondary | ICD-10-CM | POA: Diagnosis not present

## 2020-07-25 DIAGNOSIS — I499 Cardiac arrhythmia, unspecified: Secondary | ICD-10-CM | POA: Diagnosis not present

## 2020-07-25 DIAGNOSIS — M6282 Rhabdomyolysis: Secondary | ICD-10-CM | POA: Diagnosis not present

## 2020-07-25 DIAGNOSIS — R7401 Elevation of levels of liver transaminase levels: Secondary | ICD-10-CM | POA: Diagnosis not present

## 2020-07-25 DIAGNOSIS — R339 Retention of urine, unspecified: Secondary | ICD-10-CM | POA: Insufficient documentation

## 2020-07-25 DIAGNOSIS — B9620 Unspecified Escherichia coli [E. coli] as the cause of diseases classified elsewhere: Secondary | ICD-10-CM | POA: Diagnosis not present

## 2020-07-25 DIAGNOSIS — I214 Non-ST elevation (NSTEMI) myocardial infarction: Secondary | ICD-10-CM | POA: Diagnosis not present

## 2020-07-25 DIAGNOSIS — N189 Chronic kidney disease, unspecified: Secondary | ICD-10-CM | POA: Insufficient documentation

## 2020-07-25 DIAGNOSIS — I739 Peripheral vascular disease, unspecified: Secondary | ICD-10-CM | POA: Diagnosis not present

## 2020-07-25 DIAGNOSIS — E872 Acidosis: Secondary | ICD-10-CM | POA: Diagnosis not present

## 2020-07-25 DIAGNOSIS — R778 Other specified abnormalities of plasma proteins: Secondary | ICD-10-CM | POA: Diagnosis not present

## 2020-07-25 DIAGNOSIS — D638 Anemia in other chronic diseases classified elsewhere: Secondary | ICD-10-CM | POA: Diagnosis not present

## 2020-07-25 DIAGNOSIS — D649 Anemia, unspecified: Secondary | ICD-10-CM | POA: Diagnosis not present

## 2020-07-25 DIAGNOSIS — W19XXXA Unspecified fall, initial encounter: Secondary | ICD-10-CM | POA: Diagnosis not present

## 2020-07-25 DIAGNOSIS — R748 Abnormal levels of other serum enzymes: Secondary | ICD-10-CM | POA: Diagnosis not present

## 2020-07-25 DIAGNOSIS — W1830XA Fall on same level, unspecified, initial encounter: Secondary | ICD-10-CM | POA: Diagnosis not present

## 2020-07-25 DIAGNOSIS — T796XXA Traumatic ischemia of muscle, initial encounter: Secondary | ICD-10-CM | POA: Diagnosis not present

## 2020-07-25 DIAGNOSIS — I4949 Other premature depolarization: Secondary | ICD-10-CM | POA: Diagnosis not present

## 2020-07-25 DIAGNOSIS — N390 Urinary tract infection, site not specified: Secondary | ICD-10-CM | POA: Diagnosis not present

## 2020-07-25 DIAGNOSIS — N179 Acute kidney failure, unspecified: Secondary | ICD-10-CM | POA: Diagnosis not present

## 2020-07-25 DIAGNOSIS — R410 Disorientation, unspecified: Secondary | ICD-10-CM | POA: Diagnosis not present

## 2020-07-25 DIAGNOSIS — Z20822 Contact with and (suspected) exposure to covid-19: Secondary | ICD-10-CM | POA: Diagnosis not present

## 2020-07-25 DIAGNOSIS — B9629 Other Escherichia coli [E. coli] as the cause of diseases classified elsewhere: Secondary | ICD-10-CM | POA: Diagnosis not present

## 2020-08-01 DIAGNOSIS — R488 Other symbolic dysfunctions: Secondary | ICD-10-CM | POA: Diagnosis not present

## 2020-08-01 DIAGNOSIS — I739 Peripheral vascular disease, unspecified: Secondary | ICD-10-CM | POA: Diagnosis not present

## 2020-08-01 DIAGNOSIS — E785 Hyperlipidemia, unspecified: Secondary | ICD-10-CM | POA: Diagnosis not present

## 2020-08-01 DIAGNOSIS — E875 Hyperkalemia: Secondary | ICD-10-CM | POA: Diagnosis not present

## 2020-08-01 DIAGNOSIS — R339 Retention of urine, unspecified: Secondary | ICD-10-CM | POA: Diagnosis not present

## 2020-08-01 DIAGNOSIS — Z8546 Personal history of malignant neoplasm of prostate: Secondary | ICD-10-CM | POA: Diagnosis not present

## 2020-08-01 DIAGNOSIS — M6282 Rhabdomyolysis: Secondary | ICD-10-CM | POA: Diagnosis not present

## 2020-08-01 DIAGNOSIS — R279 Unspecified lack of coordination: Secondary | ICD-10-CM | POA: Diagnosis not present

## 2020-08-01 DIAGNOSIS — M6281 Muscle weakness (generalized): Secondary | ICD-10-CM | POA: Diagnosis not present

## 2020-08-01 DIAGNOSIS — L89152 Pressure ulcer of sacral region, stage 2: Secondary | ICD-10-CM | POA: Diagnosis not present

## 2020-08-01 DIAGNOSIS — N400 Enlarged prostate without lower urinary tract symptoms: Secondary | ICD-10-CM | POA: Diagnosis not present

## 2020-08-01 DIAGNOSIS — R1312 Dysphagia, oropharyngeal phase: Secondary | ICD-10-CM | POA: Diagnosis not present

## 2020-08-01 DIAGNOSIS — M519 Unspecified thoracic, thoracolumbar and lumbosacral intervertebral disc disorder: Secondary | ICD-10-CM | POA: Diagnosis not present

## 2020-08-01 DIAGNOSIS — D649 Anemia, unspecified: Secondary | ICD-10-CM | POA: Diagnosis not present

## 2020-08-01 DIAGNOSIS — R410 Disorientation, unspecified: Secondary | ICD-10-CM | POA: Diagnosis not present

## 2020-08-01 DIAGNOSIS — R5381 Other malaise: Secondary | ICD-10-CM | POA: Diagnosis not present

## 2020-08-01 DIAGNOSIS — I214 Non-ST elevation (NSTEMI) myocardial infarction: Secondary | ICD-10-CM | POA: Diagnosis not present

## 2020-08-01 DIAGNOSIS — I10 Essential (primary) hypertension: Secondary | ICD-10-CM | POA: Diagnosis not present

## 2020-08-01 DIAGNOSIS — N390 Urinary tract infection, site not specified: Secondary | ICD-10-CM | POA: Diagnosis not present

## 2020-08-01 DIAGNOSIS — R296 Repeated falls: Secondary | ICD-10-CM | POA: Diagnosis not present

## 2020-08-03 DIAGNOSIS — D649 Anemia, unspecified: Secondary | ICD-10-CM | POA: Diagnosis not present

## 2020-08-03 DIAGNOSIS — R296 Repeated falls: Secondary | ICD-10-CM | POA: Diagnosis not present

## 2020-08-03 DIAGNOSIS — R339 Retention of urine, unspecified: Secondary | ICD-10-CM | POA: Diagnosis not present

## 2020-08-03 DIAGNOSIS — M6282 Rhabdomyolysis: Secondary | ICD-10-CM | POA: Diagnosis not present

## 2020-08-03 DIAGNOSIS — R410 Disorientation, unspecified: Secondary | ICD-10-CM | POA: Diagnosis not present

## 2020-08-09 DIAGNOSIS — L89152 Pressure ulcer of sacral region, stage 2: Secondary | ICD-10-CM | POA: Diagnosis not present

## 2020-08-10 DIAGNOSIS — R410 Disorientation, unspecified: Secondary | ICD-10-CM | POA: Diagnosis not present

## 2020-08-10 DIAGNOSIS — R296 Repeated falls: Secondary | ICD-10-CM | POA: Diagnosis not present

## 2020-08-10 DIAGNOSIS — M6282 Rhabdomyolysis: Secondary | ICD-10-CM | POA: Diagnosis not present

## 2020-08-10 DIAGNOSIS — R339 Retention of urine, unspecified: Secondary | ICD-10-CM | POA: Diagnosis not present

## 2020-08-10 DIAGNOSIS — I10 Essential (primary) hypertension: Secondary | ICD-10-CM | POA: Diagnosis not present

## 2020-08-10 DIAGNOSIS — Z8546 Personal history of malignant neoplasm of prostate: Secondary | ICD-10-CM | POA: Diagnosis not present

## 2020-08-10 DIAGNOSIS — D649 Anemia, unspecified: Secondary | ICD-10-CM | POA: Diagnosis not present

## 2020-08-20 DIAGNOSIS — I10 Essential (primary) hypertension: Secondary | ICD-10-CM | POA: Diagnosis not present

## 2020-08-20 DIAGNOSIS — M6282 Rhabdomyolysis: Secondary | ICD-10-CM | POA: Diagnosis not present

## 2020-08-20 DIAGNOSIS — E785 Hyperlipidemia, unspecified: Secondary | ICD-10-CM | POA: Diagnosis not present

## 2020-08-20 DIAGNOSIS — D649 Anemia, unspecified: Secondary | ICD-10-CM | POA: Diagnosis not present

## 2020-08-20 DIAGNOSIS — R410 Disorientation, unspecified: Secondary | ICD-10-CM | POA: Diagnosis not present

## 2020-08-20 DIAGNOSIS — R296 Repeated falls: Secondary | ICD-10-CM | POA: Diagnosis not present

## 2020-08-20 DIAGNOSIS — R339 Retention of urine, unspecified: Secondary | ICD-10-CM | POA: Diagnosis not present

## 2020-08-21 DIAGNOSIS — I2510 Atherosclerotic heart disease of native coronary artery without angina pectoris: Secondary | ICD-10-CM | POA: Diagnosis not present

## 2020-08-21 DIAGNOSIS — D649 Anemia, unspecified: Secondary | ICD-10-CM | POA: Diagnosis not present

## 2020-08-22 DIAGNOSIS — I2510 Atherosclerotic heart disease of native coronary artery without angina pectoris: Secondary | ICD-10-CM | POA: Diagnosis not present

## 2020-08-23 DIAGNOSIS — L89152 Pressure ulcer of sacral region, stage 2: Secondary | ICD-10-CM | POA: Diagnosis not present

## 2020-08-29 DIAGNOSIS — M6281 Muscle weakness (generalized): Secondary | ICD-10-CM | POA: Diagnosis not present

## 2020-08-29 DIAGNOSIS — I2510 Atherosclerotic heart disease of native coronary artery without angina pectoris: Secondary | ICD-10-CM | POA: Diagnosis not present

## 2020-08-30 DIAGNOSIS — N400 Enlarged prostate without lower urinary tract symptoms: Secondary | ICD-10-CM | POA: Diagnosis not present

## 2020-08-30 DIAGNOSIS — M6282 Rhabdomyolysis: Secondary | ICD-10-CM | POA: Diagnosis not present

## 2020-08-30 DIAGNOSIS — I10 Essential (primary) hypertension: Secondary | ICD-10-CM | POA: Diagnosis not present

## 2020-08-30 DIAGNOSIS — E785 Hyperlipidemia, unspecified: Secondary | ICD-10-CM | POA: Diagnosis not present

## 2020-09-02 DIAGNOSIS — R1310 Dysphagia, unspecified: Secondary | ICD-10-CM | POA: Diagnosis not present

## 2020-09-02 DIAGNOSIS — I739 Peripheral vascular disease, unspecified: Secondary | ICD-10-CM | POA: Diagnosis not present

## 2020-09-02 DIAGNOSIS — M6281 Muscle weakness (generalized): Secondary | ICD-10-CM | POA: Diagnosis not present

## 2020-09-03 DIAGNOSIS — R1310 Dysphagia, unspecified: Secondary | ICD-10-CM | POA: Diagnosis not present

## 2020-09-03 DIAGNOSIS — I739 Peripheral vascular disease, unspecified: Secondary | ICD-10-CM | POA: Diagnosis not present

## 2020-09-03 DIAGNOSIS — M6281 Muscle weakness (generalized): Secondary | ICD-10-CM | POA: Diagnosis not present

## 2020-09-04 DIAGNOSIS — M6281 Muscle weakness (generalized): Secondary | ICD-10-CM | POA: Diagnosis not present

## 2020-09-04 DIAGNOSIS — R1310 Dysphagia, unspecified: Secondary | ICD-10-CM | POA: Diagnosis not present

## 2020-09-04 DIAGNOSIS — I739 Peripheral vascular disease, unspecified: Secondary | ICD-10-CM | POA: Diagnosis not present

## 2020-09-05 DIAGNOSIS — R1310 Dysphagia, unspecified: Secondary | ICD-10-CM | POA: Diagnosis not present

## 2020-09-05 DIAGNOSIS — I739 Peripheral vascular disease, unspecified: Secondary | ICD-10-CM | POA: Diagnosis not present

## 2020-09-05 DIAGNOSIS — M6281 Muscle weakness (generalized): Secondary | ICD-10-CM | POA: Diagnosis not present

## 2020-09-06 DIAGNOSIS — M6281 Muscle weakness (generalized): Secondary | ICD-10-CM | POA: Diagnosis not present

## 2020-09-06 DIAGNOSIS — I739 Peripheral vascular disease, unspecified: Secondary | ICD-10-CM | POA: Diagnosis not present

## 2020-09-06 DIAGNOSIS — R1310 Dysphagia, unspecified: Secondary | ICD-10-CM | POA: Diagnosis not present

## 2020-09-07 DIAGNOSIS — I739 Peripheral vascular disease, unspecified: Secondary | ICD-10-CM | POA: Diagnosis not present

## 2020-09-07 DIAGNOSIS — M6281 Muscle weakness (generalized): Secondary | ICD-10-CM | POA: Diagnosis not present

## 2020-09-07 DIAGNOSIS — R1310 Dysphagia, unspecified: Secondary | ICD-10-CM | POA: Diagnosis not present

## 2020-09-08 DIAGNOSIS — R1310 Dysphagia, unspecified: Secondary | ICD-10-CM | POA: Diagnosis not present

## 2020-09-08 DIAGNOSIS — M6281 Muscle weakness (generalized): Secondary | ICD-10-CM | POA: Diagnosis not present

## 2020-09-08 DIAGNOSIS — I739 Peripheral vascular disease, unspecified: Secondary | ICD-10-CM | POA: Diagnosis not present

## 2020-09-09 DIAGNOSIS — I739 Peripheral vascular disease, unspecified: Secondary | ICD-10-CM | POA: Diagnosis not present

## 2020-09-09 DIAGNOSIS — M6281 Muscle weakness (generalized): Secondary | ICD-10-CM | POA: Diagnosis not present

## 2020-09-09 DIAGNOSIS — R1310 Dysphagia, unspecified: Secondary | ICD-10-CM | POA: Diagnosis not present

## 2020-09-10 DIAGNOSIS — M6281 Muscle weakness (generalized): Secondary | ICD-10-CM | POA: Diagnosis not present

## 2020-09-10 DIAGNOSIS — R1310 Dysphagia, unspecified: Secondary | ICD-10-CM | POA: Diagnosis not present

## 2020-09-10 DIAGNOSIS — I739 Peripheral vascular disease, unspecified: Secondary | ICD-10-CM | POA: Diagnosis not present

## 2020-09-11 DIAGNOSIS — R1310 Dysphagia, unspecified: Secondary | ICD-10-CM | POA: Diagnosis not present

## 2020-09-11 DIAGNOSIS — M6281 Muscle weakness (generalized): Secondary | ICD-10-CM | POA: Diagnosis not present

## 2020-09-11 DIAGNOSIS — I739 Peripheral vascular disease, unspecified: Secondary | ICD-10-CM | POA: Diagnosis not present

## 2020-09-12 DIAGNOSIS — I739 Peripheral vascular disease, unspecified: Secondary | ICD-10-CM | POA: Diagnosis not present

## 2020-09-12 DIAGNOSIS — R1310 Dysphagia, unspecified: Secondary | ICD-10-CM | POA: Diagnosis not present

## 2020-09-12 DIAGNOSIS — M6281 Muscle weakness (generalized): Secondary | ICD-10-CM | POA: Diagnosis not present

## 2020-09-13 DIAGNOSIS — M6281 Muscle weakness (generalized): Secondary | ICD-10-CM | POA: Diagnosis not present

## 2020-09-13 DIAGNOSIS — R1310 Dysphagia, unspecified: Secondary | ICD-10-CM | POA: Diagnosis not present

## 2020-09-13 DIAGNOSIS — I739 Peripheral vascular disease, unspecified: Secondary | ICD-10-CM | POA: Diagnosis not present

## 2020-09-14 DIAGNOSIS — M6281 Muscle weakness (generalized): Secondary | ICD-10-CM | POA: Diagnosis not present

## 2020-09-14 DIAGNOSIS — I739 Peripheral vascular disease, unspecified: Secondary | ICD-10-CM | POA: Diagnosis not present

## 2020-09-14 DIAGNOSIS — R1310 Dysphagia, unspecified: Secondary | ICD-10-CM | POA: Diagnosis not present

## 2020-09-15 DIAGNOSIS — I739 Peripheral vascular disease, unspecified: Secondary | ICD-10-CM | POA: Diagnosis not present

## 2020-09-15 DIAGNOSIS — R1310 Dysphagia, unspecified: Secondary | ICD-10-CM | POA: Diagnosis not present

## 2020-09-15 DIAGNOSIS — M6281 Muscle weakness (generalized): Secondary | ICD-10-CM | POA: Diagnosis not present

## 2020-09-16 DIAGNOSIS — R1310 Dysphagia, unspecified: Secondary | ICD-10-CM | POA: Diagnosis not present

## 2020-09-16 DIAGNOSIS — I739 Peripheral vascular disease, unspecified: Secondary | ICD-10-CM | POA: Diagnosis not present

## 2020-09-16 DIAGNOSIS — M6281 Muscle weakness (generalized): Secondary | ICD-10-CM | POA: Diagnosis not present

## 2020-09-17 DIAGNOSIS — M6281 Muscle weakness (generalized): Secondary | ICD-10-CM | POA: Diagnosis not present

## 2020-09-17 DIAGNOSIS — I739 Peripheral vascular disease, unspecified: Secondary | ICD-10-CM | POA: Diagnosis not present

## 2020-09-17 DIAGNOSIS — R1310 Dysphagia, unspecified: Secondary | ICD-10-CM | POA: Diagnosis not present

## 2020-09-18 DIAGNOSIS — M6281 Muscle weakness (generalized): Secondary | ICD-10-CM | POA: Diagnosis not present

## 2020-09-18 DIAGNOSIS — R1310 Dysphagia, unspecified: Secondary | ICD-10-CM | POA: Diagnosis not present

## 2020-09-18 DIAGNOSIS — I739 Peripheral vascular disease, unspecified: Secondary | ICD-10-CM | POA: Diagnosis not present

## 2020-09-19 DIAGNOSIS — R1310 Dysphagia, unspecified: Secondary | ICD-10-CM | POA: Diagnosis not present

## 2020-09-19 DIAGNOSIS — I739 Peripheral vascular disease, unspecified: Secondary | ICD-10-CM | POA: Diagnosis not present

## 2020-09-19 DIAGNOSIS — M6281 Muscle weakness (generalized): Secondary | ICD-10-CM | POA: Diagnosis not present

## 2020-09-20 DIAGNOSIS — M6281 Muscle weakness (generalized): Secondary | ICD-10-CM | POA: Diagnosis not present

## 2020-09-20 DIAGNOSIS — I739 Peripheral vascular disease, unspecified: Secondary | ICD-10-CM | POA: Diagnosis not present

## 2020-09-20 DIAGNOSIS — R1310 Dysphagia, unspecified: Secondary | ICD-10-CM | POA: Diagnosis not present

## 2020-09-21 DIAGNOSIS — R1310 Dysphagia, unspecified: Secondary | ICD-10-CM | POA: Diagnosis not present

## 2020-09-21 DIAGNOSIS — M6281 Muscle weakness (generalized): Secondary | ICD-10-CM | POA: Diagnosis not present

## 2020-09-21 DIAGNOSIS — I739 Peripheral vascular disease, unspecified: Secondary | ICD-10-CM | POA: Diagnosis not present

## 2020-09-22 DIAGNOSIS — M6281 Muscle weakness (generalized): Secondary | ICD-10-CM | POA: Diagnosis not present

## 2020-09-22 DIAGNOSIS — R1310 Dysphagia, unspecified: Secondary | ICD-10-CM | POA: Diagnosis not present

## 2020-09-22 DIAGNOSIS — I739 Peripheral vascular disease, unspecified: Secondary | ICD-10-CM | POA: Diagnosis not present

## 2020-09-23 DIAGNOSIS — M6281 Muscle weakness (generalized): Secondary | ICD-10-CM | POA: Diagnosis not present

## 2020-09-23 DIAGNOSIS — R1310 Dysphagia, unspecified: Secondary | ICD-10-CM | POA: Diagnosis not present

## 2020-09-23 DIAGNOSIS — I739 Peripheral vascular disease, unspecified: Secondary | ICD-10-CM | POA: Diagnosis not present

## 2020-09-24 DIAGNOSIS — R1310 Dysphagia, unspecified: Secondary | ICD-10-CM | POA: Diagnosis not present

## 2020-09-24 DIAGNOSIS — M6281 Muscle weakness (generalized): Secondary | ICD-10-CM | POA: Diagnosis not present

## 2020-09-24 DIAGNOSIS — I739 Peripheral vascular disease, unspecified: Secondary | ICD-10-CM | POA: Diagnosis not present

## 2020-09-25 DIAGNOSIS — I739 Peripheral vascular disease, unspecified: Secondary | ICD-10-CM | POA: Diagnosis not present

## 2020-09-25 DIAGNOSIS — R1310 Dysphagia, unspecified: Secondary | ICD-10-CM | POA: Diagnosis not present

## 2020-09-25 DIAGNOSIS — M6281 Muscle weakness (generalized): Secondary | ICD-10-CM | POA: Diagnosis not present

## 2020-09-26 DIAGNOSIS — M6281 Muscle weakness (generalized): Secondary | ICD-10-CM | POA: Diagnosis not present

## 2020-09-26 DIAGNOSIS — R1310 Dysphagia, unspecified: Secondary | ICD-10-CM | POA: Diagnosis not present

## 2020-09-26 DIAGNOSIS — I739 Peripheral vascular disease, unspecified: Secondary | ICD-10-CM | POA: Diagnosis not present

## 2020-09-27 DIAGNOSIS — M6281 Muscle weakness (generalized): Secondary | ICD-10-CM | POA: Diagnosis not present

## 2020-09-27 DIAGNOSIS — R1310 Dysphagia, unspecified: Secondary | ICD-10-CM | POA: Diagnosis not present

## 2020-09-27 DIAGNOSIS — I739 Peripheral vascular disease, unspecified: Secondary | ICD-10-CM | POA: Diagnosis not present

## 2020-09-28 DIAGNOSIS — I739 Peripheral vascular disease, unspecified: Secondary | ICD-10-CM | POA: Diagnosis not present

## 2020-09-28 DIAGNOSIS — M6281 Muscle weakness (generalized): Secondary | ICD-10-CM | POA: Diagnosis not present

## 2020-09-28 DIAGNOSIS — R1310 Dysphagia, unspecified: Secondary | ICD-10-CM | POA: Diagnosis not present

## 2020-09-29 DIAGNOSIS — I739 Peripheral vascular disease, unspecified: Secondary | ICD-10-CM | POA: Diagnosis not present

## 2020-09-29 DIAGNOSIS — M6281 Muscle weakness (generalized): Secondary | ICD-10-CM | POA: Diagnosis not present

## 2020-09-30 DIAGNOSIS — I739 Peripheral vascular disease, unspecified: Secondary | ICD-10-CM | POA: Diagnosis not present

## 2020-09-30 DIAGNOSIS — M6281 Muscle weakness (generalized): Secondary | ICD-10-CM | POA: Diagnosis not present

## 2020-10-01 DIAGNOSIS — I739 Peripheral vascular disease, unspecified: Secondary | ICD-10-CM | POA: Diagnosis not present

## 2020-10-01 DIAGNOSIS — M6281 Muscle weakness (generalized): Secondary | ICD-10-CM | POA: Diagnosis not present

## 2020-10-02 DIAGNOSIS — I739 Peripheral vascular disease, unspecified: Secondary | ICD-10-CM | POA: Diagnosis not present

## 2020-10-02 DIAGNOSIS — M6281 Muscle weakness (generalized): Secondary | ICD-10-CM | POA: Diagnosis not present

## 2020-10-03 DIAGNOSIS — M6281 Muscle weakness (generalized): Secondary | ICD-10-CM | POA: Diagnosis not present

## 2020-10-03 DIAGNOSIS — I739 Peripheral vascular disease, unspecified: Secondary | ICD-10-CM | POA: Diagnosis not present

## 2020-10-04 DIAGNOSIS — M6281 Muscle weakness (generalized): Secondary | ICD-10-CM | POA: Diagnosis not present

## 2020-10-04 DIAGNOSIS — I10 Essential (primary) hypertension: Secondary | ICD-10-CM | POA: Diagnosis not present

## 2020-10-04 DIAGNOSIS — Z20828 Contact with and (suspected) exposure to other viral communicable diseases: Secondary | ICD-10-CM | POA: Diagnosis not present

## 2020-10-04 DIAGNOSIS — I739 Peripheral vascular disease, unspecified: Secondary | ICD-10-CM | POA: Diagnosis not present

## 2020-10-04 DIAGNOSIS — E785 Hyperlipidemia, unspecified: Secondary | ICD-10-CM | POA: Diagnosis not present

## 2020-10-04 DIAGNOSIS — R2681 Unsteadiness on feet: Secondary | ICD-10-CM | POA: Diagnosis not present

## 2020-10-04 DIAGNOSIS — N400 Enlarged prostate without lower urinary tract symptoms: Secondary | ICD-10-CM | POA: Diagnosis not present

## 2020-10-05 DIAGNOSIS — M6281 Muscle weakness (generalized): Secondary | ICD-10-CM | POA: Diagnosis not present

## 2020-10-05 DIAGNOSIS — I739 Peripheral vascular disease, unspecified: Secondary | ICD-10-CM | POA: Diagnosis not present

## 2020-10-06 DIAGNOSIS — M6281 Muscle weakness (generalized): Secondary | ICD-10-CM | POA: Diagnosis not present

## 2020-10-06 DIAGNOSIS — I739 Peripheral vascular disease, unspecified: Secondary | ICD-10-CM | POA: Diagnosis not present

## 2020-10-07 DIAGNOSIS — M6281 Muscle weakness (generalized): Secondary | ICD-10-CM | POA: Diagnosis not present

## 2020-10-07 DIAGNOSIS — I739 Peripheral vascular disease, unspecified: Secondary | ICD-10-CM | POA: Diagnosis not present

## 2020-10-08 DIAGNOSIS — M6281 Muscle weakness (generalized): Secondary | ICD-10-CM | POA: Diagnosis not present

## 2020-10-08 DIAGNOSIS — I739 Peripheral vascular disease, unspecified: Secondary | ICD-10-CM | POA: Diagnosis not present

## 2020-10-09 DIAGNOSIS — M6281 Muscle weakness (generalized): Secondary | ICD-10-CM | POA: Diagnosis not present

## 2020-10-09 DIAGNOSIS — I739 Peripheral vascular disease, unspecified: Secondary | ICD-10-CM | POA: Diagnosis not present

## 2020-10-10 DIAGNOSIS — I739 Peripheral vascular disease, unspecified: Secondary | ICD-10-CM | POA: Diagnosis not present

## 2020-10-10 DIAGNOSIS — M6281 Muscle weakness (generalized): Secondary | ICD-10-CM | POA: Diagnosis not present

## 2020-10-10 DIAGNOSIS — R011 Cardiac murmur, unspecified: Secondary | ICD-10-CM | POA: Diagnosis not present

## 2020-10-11 DIAGNOSIS — I739 Peripheral vascular disease, unspecified: Secondary | ICD-10-CM | POA: Diagnosis not present

## 2020-10-11 DIAGNOSIS — M6281 Muscle weakness (generalized): Secondary | ICD-10-CM | POA: Diagnosis not present

## 2020-10-11 DIAGNOSIS — Z20828 Contact with and (suspected) exposure to other viral communicable diseases: Secondary | ICD-10-CM | POA: Diagnosis not present

## 2020-10-12 ENCOUNTER — Other Ambulatory Visit: Payer: Self-pay

## 2020-10-12 DIAGNOSIS — Z8546 Personal history of malignant neoplasm of prostate: Secondary | ICD-10-CM

## 2020-10-12 DIAGNOSIS — I739 Peripheral vascular disease, unspecified: Secondary | ICD-10-CM | POA: Diagnosis not present

## 2020-10-12 DIAGNOSIS — M6281 Muscle weakness (generalized): Secondary | ICD-10-CM | POA: Diagnosis not present
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
## 2020-10-13 ENCOUNTER — Encounter: Payer: Self-pay | Admitting: Urology

## 2020-10-13 ENCOUNTER — Other Ambulatory Visit: Payer: Self-pay

## 2020-10-13 ENCOUNTER — Other Ambulatory Visit
Admission: RE | Admit: 2020-10-13 | Discharge: 2020-10-13 | Disposition: A | Discharge status: Home / Self Care | Payer: Medicare HMO | Attending: Urology | Admitting: Urology

## 2020-10-13 ENCOUNTER — Ambulatory Visit (INDEPENDENT_AMBULATORY_CARE_PROVIDER_SITE_OTHER): Payer: Medicare HMO | Admitting: Urology

## 2020-10-13 VITALS — BP 107/54 | HR 108 | Wt 144.0 lb

## 2020-10-13 DIAGNOSIS — Z8546 Personal history of malignant neoplasm of prostate: Secondary | ICD-10-CM | POA: Diagnosis not present

## 2020-10-13 DIAGNOSIS — I739 Peripheral vascular disease, unspecified: Secondary | ICD-10-CM | POA: Diagnosis not present

## 2020-10-13 DIAGNOSIS — I150 Renovascular hypertension: Secondary | ICD-10-CM | POA: Insufficient documentation

## 2020-10-13 DIAGNOSIS — M6281 Muscle weakness (generalized): Secondary | ICD-10-CM | POA: Diagnosis not present

## 2020-10-14 DIAGNOSIS — I739 Peripheral vascular disease, unspecified: Secondary | ICD-10-CM | POA: Diagnosis not present

## 2020-10-14 DIAGNOSIS — M6281 Muscle weakness (generalized): Secondary | ICD-10-CM | POA: Diagnosis not present

## 2020-10-14 LAB — PSA: Prostatic Specific Antigen: 8.68 ng/mL — ABNORMAL HIGH (ref 0.00–4.00)

## 2020-10-15 DIAGNOSIS — I739 Peripheral vascular disease, unspecified: Secondary | ICD-10-CM | POA: Diagnosis not present

## 2020-10-15 DIAGNOSIS — M6281 Muscle weakness (generalized): Secondary | ICD-10-CM | POA: Diagnosis not present

## 2020-10-16 ENCOUNTER — Telehealth: Payer: Self-pay

## 2020-10-16 DIAGNOSIS — I739 Peripheral vascular disease, unspecified: Secondary | ICD-10-CM | POA: Diagnosis not present

## 2020-10-16 DIAGNOSIS — R590 Localized enlarged lymph nodes: Secondary | ICD-10-CM | POA: Diagnosis not present

## 2020-10-16 DIAGNOSIS — D492 Neoplasm of unspecified behavior of bone, soft tissue, and skin: Secondary | ICD-10-CM | POA: Diagnosis not present

## 2020-10-16 DIAGNOSIS — R0989 Other specified symptoms and signs involving the circulatory and respiratory systems: Secondary | ICD-10-CM | POA: Diagnosis not present

## 2020-10-16 DIAGNOSIS — I6523 Occlusion and stenosis of bilateral carotid arteries: Secondary | ICD-10-CM | POA: Diagnosis not present

## 2020-10-16 DIAGNOSIS — R229 Localized swelling, mass and lump, unspecified: Secondary | ICD-10-CM | POA: Diagnosis not present

## 2020-10-16 DIAGNOSIS — M6281 Muscle weakness (generalized): Secondary | ICD-10-CM | POA: Diagnosis not present

## 2020-10-16 NOTE — Telephone Encounter (Signed)
Patient contact aware and verbalized understanding.

## 2020-10-16 NOTE — Telephone Encounter (Signed)
-----   Message from Hollice Espy, MD sent at 10/15/2020  1:50 PM EDT ----- Please call this patient's contact, know that his PSA are stable.  That is awesome news.  Plan to recheck in 6 months as discussed.  Hollice Espy, MD

## 2020-10-17 DIAGNOSIS — M6281 Muscle weakness (generalized): Secondary | ICD-10-CM | POA: Diagnosis not present

## 2020-10-17 DIAGNOSIS — E042 Nontoxic multinodular goiter: Secondary | ICD-10-CM | POA: Diagnosis not present

## 2020-10-17 DIAGNOSIS — I739 Peripheral vascular disease, unspecified: Secondary | ICD-10-CM | POA: Diagnosis not present

## 2020-10-17 DIAGNOSIS — R221 Localized swelling, mass and lump, neck: Secondary | ICD-10-CM | POA: Diagnosis not present

## 2020-10-17 DIAGNOSIS — I714 Abdominal aortic aneurysm, without rupture: Secondary | ICD-10-CM | POA: Diagnosis not present

## 2020-10-17 DIAGNOSIS — I700 Atherosclerosis of aorta: Secondary | ICD-10-CM | POA: Diagnosis not present

## 2020-10-18 DIAGNOSIS — I739 Peripheral vascular disease, unspecified: Secondary | ICD-10-CM | POA: Diagnosis not present

## 2020-10-18 DIAGNOSIS — M6281 Muscle weakness (generalized): Secondary | ICD-10-CM | POA: Diagnosis not present

## 2020-10-18 DIAGNOSIS — Z20828 Contact with and (suspected) exposure to other viral communicable diseases: Secondary | ICD-10-CM | POA: Diagnosis not present

## 2020-10-19 DIAGNOSIS — I739 Peripheral vascular disease, unspecified: Secondary | ICD-10-CM | POA: Diagnosis not present

## 2020-10-19 DIAGNOSIS — I70219 Atherosclerosis of native arteries of extremities with intermittent claudication, unspecified extremity: Secondary | ICD-10-CM | POA: Diagnosis not present

## 2020-10-19 DIAGNOSIS — M6281 Muscle weakness (generalized): Secondary | ICD-10-CM | POA: Diagnosis not present

## 2020-10-20 DIAGNOSIS — M6281 Muscle weakness (generalized): Secondary | ICD-10-CM | POA: Diagnosis not present

## 2020-10-20 DIAGNOSIS — I739 Peripheral vascular disease, unspecified: Secondary | ICD-10-CM | POA: Diagnosis not present

## 2020-10-21 DIAGNOSIS — M6281 Muscle weakness (generalized): Secondary | ICD-10-CM | POA: Diagnosis not present

## 2020-10-21 DIAGNOSIS — I739 Peripheral vascular disease, unspecified: Secondary | ICD-10-CM | POA: Diagnosis not present

## 2020-10-22 DIAGNOSIS — I739 Peripheral vascular disease, unspecified: Secondary | ICD-10-CM | POA: Diagnosis not present

## 2020-10-22 DIAGNOSIS — M6281 Muscle weakness (generalized): Secondary | ICD-10-CM | POA: Diagnosis not present

## 2020-10-23 DIAGNOSIS — I739 Peripheral vascular disease, unspecified: Secondary | ICD-10-CM | POA: Diagnosis not present

## 2020-10-23 DIAGNOSIS — M6281 Muscle weakness (generalized): Secondary | ICD-10-CM | POA: Diagnosis not present

## 2020-10-24 DIAGNOSIS — M6281 Muscle weakness (generalized): Secondary | ICD-10-CM | POA: Diagnosis not present

## 2020-10-24 DIAGNOSIS — I739 Peripheral vascular disease, unspecified: Secondary | ICD-10-CM | POA: Diagnosis not present

## 2020-10-25 DIAGNOSIS — M6281 Muscle weakness (generalized): Secondary | ICD-10-CM | POA: Diagnosis not present

## 2020-10-25 DIAGNOSIS — Z20828 Contact with and (suspected) exposure to other viral communicable diseases: Secondary | ICD-10-CM | POA: Diagnosis not present

## 2020-10-25 DIAGNOSIS — I739 Peripheral vascular disease, unspecified: Secondary | ICD-10-CM | POA: Diagnosis not present

## 2020-10-26 DIAGNOSIS — N400 Enlarged prostate without lower urinary tract symptoms: Secondary | ICD-10-CM | POA: Diagnosis not present

## 2020-10-26 DIAGNOSIS — E559 Vitamin D deficiency, unspecified: Secondary | ICD-10-CM | POA: Diagnosis not present

## 2020-10-26 DIAGNOSIS — E785 Hyperlipidemia, unspecified: Secondary | ICD-10-CM | POA: Diagnosis not present

## 2020-10-26 DIAGNOSIS — E038 Other specified hypothyroidism: Secondary | ICD-10-CM | POA: Diagnosis not present

## 2020-10-26 DIAGNOSIS — D518 Other vitamin B12 deficiency anemias: Secondary | ICD-10-CM | POA: Diagnosis not present

## 2020-10-26 DIAGNOSIS — E119 Type 2 diabetes mellitus without complications: Secondary | ICD-10-CM | POA: Diagnosis not present

## 2020-10-26 DIAGNOSIS — M6281 Muscle weakness (generalized): Secondary | ICD-10-CM | POA: Diagnosis not present

## 2020-10-26 DIAGNOSIS — I739 Peripheral vascular disease, unspecified: Secondary | ICD-10-CM | POA: Diagnosis not present

## 2020-10-26 DIAGNOSIS — I10 Essential (primary) hypertension: Secondary | ICD-10-CM | POA: Diagnosis not present

## 2020-10-27 DIAGNOSIS — I739 Peripheral vascular disease, unspecified: Secondary | ICD-10-CM | POA: Diagnosis not present

## 2020-10-27 DIAGNOSIS — M6281 Muscle weakness (generalized): Secondary | ICD-10-CM | POA: Diagnosis not present

## 2020-10-28 DIAGNOSIS — M6281 Muscle weakness (generalized): Secondary | ICD-10-CM | POA: Diagnosis not present

## 2020-10-28 DIAGNOSIS — I739 Peripheral vascular disease, unspecified: Secondary | ICD-10-CM | POA: Diagnosis not present

## 2020-10-29 DIAGNOSIS — I739 Peripheral vascular disease, unspecified: Secondary | ICD-10-CM | POA: Diagnosis not present

## 2020-10-29 DIAGNOSIS — M6281 Muscle weakness (generalized): Secondary | ICD-10-CM | POA: Diagnosis not present

## 2020-10-30 DIAGNOSIS — I739 Peripheral vascular disease, unspecified: Secondary | ICD-10-CM | POA: Diagnosis not present

## 2020-10-30 DIAGNOSIS — M6281 Muscle weakness (generalized): Secondary | ICD-10-CM | POA: Diagnosis not present

## 2020-10-31 DIAGNOSIS — I739 Peripheral vascular disease, unspecified: Secondary | ICD-10-CM | POA: Diagnosis not present

## 2020-10-31 DIAGNOSIS — M6281 Muscle weakness (generalized): Secondary | ICD-10-CM | POA: Diagnosis not present

## 2020-11-01 DIAGNOSIS — M6281 Muscle weakness (generalized): Secondary | ICD-10-CM | POA: Diagnosis not present

## 2020-11-01 DIAGNOSIS — I739 Peripheral vascular disease, unspecified: Secondary | ICD-10-CM | POA: Diagnosis not present

## 2020-11-01 DIAGNOSIS — Z20828 Contact with and (suspected) exposure to other viral communicable diseases: Secondary | ICD-10-CM | POA: Diagnosis not present

## 2020-11-02 DIAGNOSIS — E559 Vitamin D deficiency, unspecified: Secondary | ICD-10-CM | POA: Diagnosis not present

## 2020-11-02 DIAGNOSIS — M6281 Muscle weakness (generalized): Secondary | ICD-10-CM | POA: Diagnosis not present

## 2020-11-02 DIAGNOSIS — Z79899 Other long term (current) drug therapy: Secondary | ICD-10-CM | POA: Diagnosis not present

## 2020-11-02 DIAGNOSIS — I739 Peripheral vascular disease, unspecified: Secondary | ICD-10-CM | POA: Diagnosis not present

## 2020-11-02 DIAGNOSIS — E038 Other specified hypothyroidism: Secondary | ICD-10-CM | POA: Diagnosis not present

## 2020-11-02 DIAGNOSIS — E119 Type 2 diabetes mellitus without complications: Secondary | ICD-10-CM | POA: Diagnosis not present

## 2020-11-02 DIAGNOSIS — E7849 Other hyperlipidemia: Secondary | ICD-10-CM | POA: Diagnosis not present

## 2020-11-02 DIAGNOSIS — D518 Other vitamin B12 deficiency anemias: Secondary | ICD-10-CM | POA: Diagnosis not present

## 2020-11-03 DIAGNOSIS — I739 Peripheral vascular disease, unspecified: Secondary | ICD-10-CM | POA: Diagnosis not present

## 2020-11-03 DIAGNOSIS — M6281 Muscle weakness (generalized): Secondary | ICD-10-CM | POA: Diagnosis not present

## 2020-11-04 DIAGNOSIS — I739 Peripheral vascular disease, unspecified: Secondary | ICD-10-CM | POA: Diagnosis not present

## 2020-11-04 DIAGNOSIS — M6281 Muscle weakness (generalized): Secondary | ICD-10-CM | POA: Diagnosis not present

## 2020-11-05 DIAGNOSIS — I739 Peripheral vascular disease, unspecified: Secondary | ICD-10-CM | POA: Diagnosis not present

## 2020-11-05 DIAGNOSIS — M6281 Muscle weakness (generalized): Secondary | ICD-10-CM | POA: Diagnosis not present

## 2020-11-06 DIAGNOSIS — I739 Peripheral vascular disease, unspecified: Secondary | ICD-10-CM | POA: Diagnosis not present

## 2020-11-06 DIAGNOSIS — M6281 Muscle weakness (generalized): Secondary | ICD-10-CM | POA: Diagnosis not present

## 2020-11-07 DIAGNOSIS — M6281 Muscle weakness (generalized): Secondary | ICD-10-CM | POA: Diagnosis not present

## 2020-11-07 DIAGNOSIS — I739 Peripheral vascular disease, unspecified: Secondary | ICD-10-CM | POA: Diagnosis not present

## 2020-11-08 DIAGNOSIS — I739 Peripheral vascular disease, unspecified: Secondary | ICD-10-CM | POA: Diagnosis not present

## 2020-11-08 DIAGNOSIS — N400 Enlarged prostate without lower urinary tract symptoms: Secondary | ICD-10-CM | POA: Diagnosis not present

## 2020-11-08 DIAGNOSIS — R2681 Unsteadiness on feet: Secondary | ICD-10-CM | POA: Diagnosis not present

## 2020-11-08 DIAGNOSIS — Z20828 Contact with and (suspected) exposure to other viral communicable diseases: Secondary | ICD-10-CM | POA: Diagnosis not present

## 2020-11-08 DIAGNOSIS — I10 Essential (primary) hypertension: Secondary | ICD-10-CM | POA: Diagnosis not present

## 2020-11-08 DIAGNOSIS — M6281 Muscle weakness (generalized): Secondary | ICD-10-CM | POA: Diagnosis not present

## 2020-11-08 DIAGNOSIS — E785 Hyperlipidemia, unspecified: Secondary | ICD-10-CM | POA: Diagnosis not present

## 2020-11-09 DIAGNOSIS — M6281 Muscle weakness (generalized): Secondary | ICD-10-CM | POA: Diagnosis not present

## 2020-11-09 DIAGNOSIS — I10 Essential (primary) hypertension: Secondary | ICD-10-CM | POA: Diagnosis not present

## 2020-11-09 DIAGNOSIS — E038 Other specified hypothyroidism: Secondary | ICD-10-CM | POA: Diagnosis not present

## 2020-11-09 DIAGNOSIS — E559 Vitamin D deficiency, unspecified: Secondary | ICD-10-CM | POA: Diagnosis not present

## 2020-11-09 DIAGNOSIS — I739 Peripheral vascular disease, unspecified: Secondary | ICD-10-CM | POA: Diagnosis not present

## 2020-11-09 DIAGNOSIS — D518 Other vitamin B12 deficiency anemias: Secondary | ICD-10-CM | POA: Diagnosis not present

## 2020-11-09 DIAGNOSIS — N400 Enlarged prostate without lower urinary tract symptoms: Secondary | ICD-10-CM | POA: Diagnosis not present

## 2020-11-09 DIAGNOSIS — E119 Type 2 diabetes mellitus without complications: Secondary | ICD-10-CM | POA: Diagnosis not present

## 2020-11-09 DIAGNOSIS — E785 Hyperlipidemia, unspecified: Secondary | ICD-10-CM | POA: Diagnosis not present

## 2020-11-10 DIAGNOSIS — M6281 Muscle weakness (generalized): Secondary | ICD-10-CM | POA: Diagnosis not present

## 2020-11-10 DIAGNOSIS — I739 Peripheral vascular disease, unspecified: Secondary | ICD-10-CM | POA: Diagnosis not present

## 2020-11-11 DIAGNOSIS — I739 Peripheral vascular disease, unspecified: Secondary | ICD-10-CM | POA: Diagnosis not present

## 2020-11-11 DIAGNOSIS — M6281 Muscle weakness (generalized): Secondary | ICD-10-CM | POA: Diagnosis not present

## 2020-11-12 DIAGNOSIS — M6281 Muscle weakness (generalized): Secondary | ICD-10-CM | POA: Diagnosis not present

## 2020-11-12 DIAGNOSIS — I739 Peripheral vascular disease, unspecified: Secondary | ICD-10-CM | POA: Diagnosis not present

## 2020-11-13 DIAGNOSIS — M6281 Muscle weakness (generalized): Secondary | ICD-10-CM | POA: Diagnosis not present

## 2020-11-13 DIAGNOSIS — I739 Peripheral vascular disease, unspecified: Secondary | ICD-10-CM | POA: Diagnosis not present

## 2020-11-14 DIAGNOSIS — M6281 Muscle weakness (generalized): Secondary | ICD-10-CM | POA: Diagnosis not present

## 2020-11-14 DIAGNOSIS — I739 Peripheral vascular disease, unspecified: Secondary | ICD-10-CM | POA: Diagnosis not present

## 2020-11-15 DIAGNOSIS — M6281 Muscle weakness (generalized): Secondary | ICD-10-CM | POA: Diagnosis not present

## 2020-11-15 DIAGNOSIS — Z20828 Contact with and (suspected) exposure to other viral communicable diseases: Secondary | ICD-10-CM | POA: Diagnosis not present

## 2020-11-15 DIAGNOSIS — I739 Peripheral vascular disease, unspecified: Secondary | ICD-10-CM | POA: Diagnosis not present

## 2020-11-16 DIAGNOSIS — M6281 Muscle weakness (generalized): Secondary | ICD-10-CM | POA: Diagnosis not present

## 2020-11-16 DIAGNOSIS — I739 Peripheral vascular disease, unspecified: Secondary | ICD-10-CM | POA: Diagnosis not present

## 2020-11-17 DIAGNOSIS — I739 Peripheral vascular disease, unspecified: Secondary | ICD-10-CM | POA: Diagnosis not present

## 2020-11-17 DIAGNOSIS — M6281 Muscle weakness (generalized): Secondary | ICD-10-CM | POA: Diagnosis not present

## 2020-11-18 DIAGNOSIS — I739 Peripheral vascular disease, unspecified: Secondary | ICD-10-CM | POA: Diagnosis not present

## 2020-11-18 DIAGNOSIS — M6281 Muscle weakness (generalized): Secondary | ICD-10-CM | POA: Diagnosis not present

## 2020-11-19 DIAGNOSIS — I739 Peripheral vascular disease, unspecified: Secondary | ICD-10-CM | POA: Diagnosis not present

## 2020-11-19 DIAGNOSIS — M6281 Muscle weakness (generalized): Secondary | ICD-10-CM | POA: Diagnosis not present

## 2020-11-20 DIAGNOSIS — I739 Peripheral vascular disease, unspecified: Secondary | ICD-10-CM | POA: Diagnosis not present

## 2020-11-20 DIAGNOSIS — M6281 Muscle weakness (generalized): Secondary | ICD-10-CM | POA: Diagnosis not present

## 2020-11-20 DIAGNOSIS — Z20828 Contact with and (suspected) exposure to other viral communicable diseases: Secondary | ICD-10-CM | POA: Diagnosis not present

## 2020-11-21 DIAGNOSIS — M6281 Muscle weakness (generalized): Secondary | ICD-10-CM | POA: Diagnosis not present

## 2020-11-21 DIAGNOSIS — I739 Peripheral vascular disease, unspecified: Secondary | ICD-10-CM | POA: Diagnosis not present

## 2020-11-22 DIAGNOSIS — I739 Peripheral vascular disease, unspecified: Secondary | ICD-10-CM | POA: Diagnosis not present

## 2020-11-22 DIAGNOSIS — M6281 Muscle weakness (generalized): Secondary | ICD-10-CM | POA: Diagnosis not present

## 2020-11-23 DIAGNOSIS — M6281 Muscle weakness (generalized): Secondary | ICD-10-CM | POA: Diagnosis not present

## 2020-11-23 DIAGNOSIS — I739 Peripheral vascular disease, unspecified: Secondary | ICD-10-CM | POA: Diagnosis not present

## 2020-11-24 DIAGNOSIS — I739 Peripheral vascular disease, unspecified: Secondary | ICD-10-CM | POA: Diagnosis not present

## 2020-11-24 DIAGNOSIS — M6281 Muscle weakness (generalized): Secondary | ICD-10-CM | POA: Diagnosis not present

## 2020-11-25 DIAGNOSIS — I739 Peripheral vascular disease, unspecified: Secondary | ICD-10-CM | POA: Diagnosis not present

## 2020-11-25 DIAGNOSIS — M6281 Muscle weakness (generalized): Secondary | ICD-10-CM | POA: Diagnosis not present

## 2020-11-26 DIAGNOSIS — I739 Peripheral vascular disease, unspecified: Secondary | ICD-10-CM | POA: Diagnosis not present

## 2020-11-26 DIAGNOSIS — M6281 Muscle weakness (generalized): Secondary | ICD-10-CM | POA: Diagnosis not present

## 2020-11-27 DIAGNOSIS — I739 Peripheral vascular disease, unspecified: Secondary | ICD-10-CM | POA: Diagnosis not present

## 2020-11-27 DIAGNOSIS — M6281 Muscle weakness (generalized): Secondary | ICD-10-CM | POA: Diagnosis not present

## 2020-11-28 DIAGNOSIS — M6281 Muscle weakness (generalized): Secondary | ICD-10-CM | POA: Diagnosis not present

## 2020-11-28 DIAGNOSIS — I739 Peripheral vascular disease, unspecified: Secondary | ICD-10-CM | POA: Diagnosis not present

## 2020-11-29 DIAGNOSIS — M6281 Muscle weakness (generalized): Secondary | ICD-10-CM | POA: Diagnosis not present

## 2020-11-29 DIAGNOSIS — I739 Peripheral vascular disease, unspecified: Secondary | ICD-10-CM | POA: Diagnosis not present

## 2020-11-30 DIAGNOSIS — I739 Peripheral vascular disease, unspecified: Secondary | ICD-10-CM | POA: Diagnosis not present

## 2020-11-30 DIAGNOSIS — M6281 Muscle weakness (generalized): Secondary | ICD-10-CM | POA: Diagnosis not present

## 2020-12-01 DIAGNOSIS — M6281 Muscle weakness (generalized): Secondary | ICD-10-CM | POA: Diagnosis not present

## 2020-12-01 DIAGNOSIS — I739 Peripheral vascular disease, unspecified: Secondary | ICD-10-CM | POA: Diagnosis not present

## 2020-12-02 DIAGNOSIS — M6281 Muscle weakness (generalized): Secondary | ICD-10-CM | POA: Diagnosis not present

## 2020-12-02 DIAGNOSIS — I739 Peripheral vascular disease, unspecified: Secondary | ICD-10-CM | POA: Diagnosis not present

## 2020-12-03 DIAGNOSIS — M6281 Muscle weakness (generalized): Secondary | ICD-10-CM | POA: Diagnosis not present

## 2020-12-03 DIAGNOSIS — I739 Peripheral vascular disease, unspecified: Secondary | ICD-10-CM | POA: Diagnosis not present

## 2020-12-04 DIAGNOSIS — I739 Peripheral vascular disease, unspecified: Secondary | ICD-10-CM | POA: Diagnosis not present

## 2020-12-04 DIAGNOSIS — M6281 Muscle weakness (generalized): Secondary | ICD-10-CM | POA: Diagnosis not present

## 2020-12-05 DIAGNOSIS — M6281 Muscle weakness (generalized): Secondary | ICD-10-CM | POA: Diagnosis not present

## 2020-12-05 DIAGNOSIS — I739 Peripheral vascular disease, unspecified: Secondary | ICD-10-CM | POA: Diagnosis not present

## 2020-12-06 DIAGNOSIS — M6281 Muscle weakness (generalized): Secondary | ICD-10-CM | POA: Diagnosis not present

## 2020-12-06 DIAGNOSIS — I739 Peripheral vascular disease, unspecified: Secondary | ICD-10-CM | POA: Diagnosis not present

## 2020-12-07 DIAGNOSIS — E038 Other specified hypothyroidism: Secondary | ICD-10-CM | POA: Diagnosis not present

## 2020-12-07 DIAGNOSIS — M6281 Muscle weakness (generalized): Secondary | ICD-10-CM | POA: Diagnosis not present

## 2020-12-07 DIAGNOSIS — N400 Enlarged prostate without lower urinary tract symptoms: Secondary | ICD-10-CM | POA: Diagnosis not present

## 2020-12-07 DIAGNOSIS — D518 Other vitamin B12 deficiency anemias: Secondary | ICD-10-CM | POA: Diagnosis not present

## 2020-12-07 DIAGNOSIS — I739 Peripheral vascular disease, unspecified: Secondary | ICD-10-CM | POA: Diagnosis not present

## 2020-12-07 DIAGNOSIS — I10 Essential (primary) hypertension: Secondary | ICD-10-CM | POA: Diagnosis not present

## 2020-12-07 DIAGNOSIS — E119 Type 2 diabetes mellitus without complications: Secondary | ICD-10-CM | POA: Diagnosis not present

## 2020-12-07 DIAGNOSIS — E785 Hyperlipidemia, unspecified: Secondary | ICD-10-CM | POA: Diagnosis not present

## 2020-12-07 DIAGNOSIS — E559 Vitamin D deficiency, unspecified: Secondary | ICD-10-CM | POA: Diagnosis not present

## 2020-12-08 DIAGNOSIS — B351 Tinea unguium: Secondary | ICD-10-CM | POA: Diagnosis not present

## 2020-12-08 DIAGNOSIS — M6281 Muscle weakness (generalized): Secondary | ICD-10-CM | POA: Diagnosis not present

## 2020-12-08 DIAGNOSIS — I739 Peripheral vascular disease, unspecified: Secondary | ICD-10-CM | POA: Diagnosis not present

## 2020-12-09 DIAGNOSIS — M6281 Muscle weakness (generalized): Secondary | ICD-10-CM | POA: Diagnosis not present

## 2020-12-09 DIAGNOSIS — I739 Peripheral vascular disease, unspecified: Secondary | ICD-10-CM | POA: Diagnosis not present

## 2020-12-10 DIAGNOSIS — I739 Peripheral vascular disease, unspecified: Secondary | ICD-10-CM | POA: Diagnosis not present

## 2020-12-10 DIAGNOSIS — M6281 Muscle weakness (generalized): Secondary | ICD-10-CM | POA: Diagnosis not present

## 2020-12-11 DIAGNOSIS — I739 Peripheral vascular disease, unspecified: Secondary | ICD-10-CM | POA: Diagnosis not present

## 2020-12-11 DIAGNOSIS — M6281 Muscle weakness (generalized): Secondary | ICD-10-CM | POA: Diagnosis not present

## 2020-12-12 DIAGNOSIS — M6281 Muscle weakness (generalized): Secondary | ICD-10-CM | POA: Diagnosis not present

## 2020-12-12 DIAGNOSIS — I739 Peripheral vascular disease, unspecified: Secondary | ICD-10-CM | POA: Diagnosis not present

## 2020-12-13 DIAGNOSIS — M6281 Muscle weakness (generalized): Secondary | ICD-10-CM | POA: Diagnosis not present

## 2020-12-13 DIAGNOSIS — I739 Peripheral vascular disease, unspecified: Secondary | ICD-10-CM | POA: Diagnosis not present

## 2020-12-14 DIAGNOSIS — M6281 Muscle weakness (generalized): Secondary | ICD-10-CM | POA: Diagnosis not present

## 2020-12-14 DIAGNOSIS — I739 Peripheral vascular disease, unspecified: Secondary | ICD-10-CM | POA: Diagnosis not present

## 2020-12-15 DIAGNOSIS — M6281 Muscle weakness (generalized): Secondary | ICD-10-CM | POA: Diagnosis not present

## 2020-12-15 DIAGNOSIS — I739 Peripheral vascular disease, unspecified: Secondary | ICD-10-CM | POA: Diagnosis not present

## 2020-12-16 DIAGNOSIS — I739 Peripheral vascular disease, unspecified: Secondary | ICD-10-CM | POA: Diagnosis not present

## 2020-12-16 DIAGNOSIS — M6281 Muscle weakness (generalized): Secondary | ICD-10-CM | POA: Diagnosis not present

## 2020-12-17 DIAGNOSIS — I739 Peripheral vascular disease, unspecified: Secondary | ICD-10-CM | POA: Diagnosis not present

## 2020-12-17 DIAGNOSIS — M6281 Muscle weakness (generalized): Secondary | ICD-10-CM | POA: Diagnosis not present

## 2020-12-18 DIAGNOSIS — M6281 Muscle weakness (generalized): Secondary | ICD-10-CM | POA: Diagnosis not present

## 2020-12-18 DIAGNOSIS — I739 Peripheral vascular disease, unspecified: Secondary | ICD-10-CM | POA: Diagnosis not present

## 2020-12-19 DIAGNOSIS — I739 Peripheral vascular disease, unspecified: Secondary | ICD-10-CM | POA: Diagnosis not present

## 2020-12-19 DIAGNOSIS — M6281 Muscle weakness (generalized): Secondary | ICD-10-CM | POA: Diagnosis not present

## 2020-12-20 DIAGNOSIS — M6281 Muscle weakness (generalized): Secondary | ICD-10-CM | POA: Diagnosis not present

## 2020-12-20 DIAGNOSIS — I739 Peripheral vascular disease, unspecified: Secondary | ICD-10-CM | POA: Diagnosis not present

## 2020-12-21 DIAGNOSIS — I739 Peripheral vascular disease, unspecified: Secondary | ICD-10-CM | POA: Diagnosis not present

## 2020-12-21 DIAGNOSIS — M6281 Muscle weakness (generalized): Secondary | ICD-10-CM | POA: Diagnosis not present

## 2020-12-22 DIAGNOSIS — I739 Peripheral vascular disease, unspecified: Secondary | ICD-10-CM | POA: Diagnosis not present

## 2020-12-22 DIAGNOSIS — M6281 Muscle weakness (generalized): Secondary | ICD-10-CM | POA: Diagnosis not present

## 2020-12-23 DIAGNOSIS — I739 Peripheral vascular disease, unspecified: Secondary | ICD-10-CM | POA: Diagnosis not present

## 2020-12-23 DIAGNOSIS — M6281 Muscle weakness (generalized): Secondary | ICD-10-CM | POA: Diagnosis not present

## 2020-12-24 DIAGNOSIS — M6281 Muscle weakness (generalized): Secondary | ICD-10-CM | POA: Diagnosis not present

## 2020-12-24 DIAGNOSIS — I739 Peripheral vascular disease, unspecified: Secondary | ICD-10-CM | POA: Diagnosis not present

## 2020-12-25 DIAGNOSIS — M6281 Muscle weakness (generalized): Secondary | ICD-10-CM | POA: Diagnosis not present

## 2020-12-25 DIAGNOSIS — I739 Peripheral vascular disease, unspecified: Secondary | ICD-10-CM | POA: Diagnosis not present

## 2020-12-26 DIAGNOSIS — I739 Peripheral vascular disease, unspecified: Secondary | ICD-10-CM | POA: Diagnosis not present

## 2020-12-26 DIAGNOSIS — M6281 Muscle weakness (generalized): Secondary | ICD-10-CM | POA: Diagnosis not present

## 2020-12-27 DIAGNOSIS — M6281 Muscle weakness (generalized): Secondary | ICD-10-CM | POA: Diagnosis not present

## 2020-12-27 DIAGNOSIS — I739 Peripheral vascular disease, unspecified: Secondary | ICD-10-CM | POA: Diagnosis not present

## 2020-12-28 DIAGNOSIS — M6281 Muscle weakness (generalized): Secondary | ICD-10-CM | POA: Diagnosis not present

## 2020-12-28 DIAGNOSIS — I739 Peripheral vascular disease, unspecified: Secondary | ICD-10-CM | POA: Diagnosis not present

## 2020-12-29 DIAGNOSIS — M6281 Muscle weakness (generalized): Secondary | ICD-10-CM | POA: Diagnosis not present

## 2020-12-29 DIAGNOSIS — I739 Peripheral vascular disease, unspecified: Secondary | ICD-10-CM | POA: Diagnosis not present

## 2020-12-30 DIAGNOSIS — I739 Peripheral vascular disease, unspecified: Secondary | ICD-10-CM | POA: Diagnosis not present

## 2020-12-30 DIAGNOSIS — M6281 Muscle weakness (generalized): Secondary | ICD-10-CM | POA: Diagnosis not present

## 2020-12-31 DIAGNOSIS — M6281 Muscle weakness (generalized): Secondary | ICD-10-CM | POA: Diagnosis not present

## 2020-12-31 DIAGNOSIS — I739 Peripheral vascular disease, unspecified: Secondary | ICD-10-CM | POA: Diagnosis not present

## 2021-01-01 DIAGNOSIS — M6281 Muscle weakness (generalized): Secondary | ICD-10-CM | POA: Diagnosis not present

## 2021-01-01 DIAGNOSIS — I739 Peripheral vascular disease, unspecified: Secondary | ICD-10-CM | POA: Diagnosis not present

## 2021-01-02 DIAGNOSIS — I739 Peripheral vascular disease, unspecified: Secondary | ICD-10-CM | POA: Diagnosis not present

## 2021-01-02 DIAGNOSIS — N400 Enlarged prostate without lower urinary tract symptoms: Secondary | ICD-10-CM | POA: Diagnosis not present

## 2021-01-02 DIAGNOSIS — M6281 Muscle weakness (generalized): Secondary | ICD-10-CM | POA: Diagnosis not present

## 2021-01-02 DIAGNOSIS — E785 Hyperlipidemia, unspecified: Secondary | ICD-10-CM | POA: Diagnosis not present

## 2021-01-02 DIAGNOSIS — R2681 Unsteadiness on feet: Secondary | ICD-10-CM | POA: Diagnosis not present

## 2021-01-02 DIAGNOSIS — J069 Acute upper respiratory infection, unspecified: Secondary | ICD-10-CM | POA: Diagnosis not present

## 2021-01-02 DIAGNOSIS — I10 Essential (primary) hypertension: Secondary | ICD-10-CM | POA: Diagnosis not present

## 2021-01-03 DIAGNOSIS — I739 Peripheral vascular disease, unspecified: Secondary | ICD-10-CM | POA: Diagnosis not present

## 2021-01-03 DIAGNOSIS — M6281 Muscle weakness (generalized): Secondary | ICD-10-CM | POA: Diagnosis not present

## 2021-01-04 DIAGNOSIS — I739 Peripheral vascular disease, unspecified: Secondary | ICD-10-CM | POA: Diagnosis not present

## 2021-01-04 DIAGNOSIS — M6281 Muscle weakness (generalized): Secondary | ICD-10-CM | POA: Diagnosis not present

## 2021-01-05 DIAGNOSIS — I739 Peripheral vascular disease, unspecified: Secondary | ICD-10-CM | POA: Diagnosis not present

## 2021-01-05 DIAGNOSIS — M6281 Muscle weakness (generalized): Secondary | ICD-10-CM | POA: Diagnosis not present

## 2021-01-06 DIAGNOSIS — M6281 Muscle weakness (generalized): Secondary | ICD-10-CM | POA: Diagnosis not present

## 2021-01-06 DIAGNOSIS — I739 Peripheral vascular disease, unspecified: Secondary | ICD-10-CM | POA: Diagnosis not present

## 2021-01-07 DIAGNOSIS — I739 Peripheral vascular disease, unspecified: Secondary | ICD-10-CM | POA: Diagnosis not present

## 2021-01-07 DIAGNOSIS — M6281 Muscle weakness (generalized): Secondary | ICD-10-CM | POA: Diagnosis not present

## 2021-01-08 DIAGNOSIS — I739 Peripheral vascular disease, unspecified: Secondary | ICD-10-CM | POA: Diagnosis not present

## 2021-01-08 DIAGNOSIS — M6281 Muscle weakness (generalized): Secondary | ICD-10-CM | POA: Diagnosis not present

## 2021-01-09 DIAGNOSIS — I739 Peripheral vascular disease, unspecified: Secondary | ICD-10-CM | POA: Diagnosis not present

## 2021-01-09 DIAGNOSIS — M6281 Muscle weakness (generalized): Secondary | ICD-10-CM | POA: Diagnosis not present

## 2021-01-10 DIAGNOSIS — I739 Peripheral vascular disease, unspecified: Secondary | ICD-10-CM | POA: Diagnosis not present

## 2021-01-10 DIAGNOSIS — M6281 Muscle weakness (generalized): Secondary | ICD-10-CM | POA: Diagnosis not present

## 2021-01-11 DIAGNOSIS — I739 Peripheral vascular disease, unspecified: Secondary | ICD-10-CM | POA: Diagnosis not present

## 2021-01-11 DIAGNOSIS — M6281 Muscle weakness (generalized): Secondary | ICD-10-CM | POA: Diagnosis not present

## 2021-01-12 DIAGNOSIS — M6281 Muscle weakness (generalized): Secondary | ICD-10-CM | POA: Diagnosis not present

## 2021-01-12 DIAGNOSIS — I739 Peripheral vascular disease, unspecified: Secondary | ICD-10-CM | POA: Diagnosis not present

## 2021-01-13 DIAGNOSIS — I739 Peripheral vascular disease, unspecified: Secondary | ICD-10-CM | POA: Diagnosis not present

## 2021-01-13 DIAGNOSIS — M6281 Muscle weakness (generalized): Secondary | ICD-10-CM | POA: Diagnosis not present

## 2021-01-14 DIAGNOSIS — I739 Peripheral vascular disease, unspecified: Secondary | ICD-10-CM | POA: Diagnosis not present

## 2021-01-14 DIAGNOSIS — M6281 Muscle weakness (generalized): Secondary | ICD-10-CM | POA: Diagnosis not present

## 2021-01-15 DIAGNOSIS — D518 Other vitamin B12 deficiency anemias: Secondary | ICD-10-CM | POA: Diagnosis not present

## 2021-01-15 DIAGNOSIS — I739 Peripheral vascular disease, unspecified: Secondary | ICD-10-CM | POA: Diagnosis not present

## 2021-01-15 DIAGNOSIS — E038 Other specified hypothyroidism: Secondary | ICD-10-CM | POA: Diagnosis not present

## 2021-01-15 DIAGNOSIS — M6281 Muscle weakness (generalized): Secondary | ICD-10-CM | POA: Diagnosis not present

## 2021-01-15 DIAGNOSIS — D508 Other iron deficiency anemias: Secondary | ICD-10-CM | POA: Diagnosis not present

## 2021-01-15 DIAGNOSIS — E559 Vitamin D deficiency, unspecified: Secondary | ICD-10-CM | POA: Diagnosis not present

## 2021-01-15 DIAGNOSIS — N400 Enlarged prostate without lower urinary tract symptoms: Secondary | ICD-10-CM | POA: Diagnosis not present

## 2021-01-15 DIAGNOSIS — E119 Type 2 diabetes mellitus without complications: Secondary | ICD-10-CM | POA: Diagnosis not present

## 2021-01-15 DIAGNOSIS — I10 Essential (primary) hypertension: Secondary | ICD-10-CM | POA: Diagnosis not present

## 2021-01-15 DIAGNOSIS — E785 Hyperlipidemia, unspecified: Secondary | ICD-10-CM | POA: Diagnosis not present

## 2021-01-16 DIAGNOSIS — M6281 Muscle weakness (generalized): Secondary | ICD-10-CM | POA: Diagnosis not present

## 2021-01-16 DIAGNOSIS — I739 Peripheral vascular disease, unspecified: Secondary | ICD-10-CM | POA: Diagnosis not present

## 2021-01-17 DIAGNOSIS — I739 Peripheral vascular disease, unspecified: Secondary | ICD-10-CM | POA: Diagnosis not present

## 2021-01-17 DIAGNOSIS — M6281 Muscle weakness (generalized): Secondary | ICD-10-CM | POA: Diagnosis not present

## 2021-01-18 DIAGNOSIS — M6281 Muscle weakness (generalized): Secondary | ICD-10-CM | POA: Diagnosis not present

## 2021-01-18 DIAGNOSIS — I739 Peripheral vascular disease, unspecified: Secondary | ICD-10-CM | POA: Diagnosis not present

## 2021-01-19 DIAGNOSIS — I739 Peripheral vascular disease, unspecified: Secondary | ICD-10-CM | POA: Diagnosis not present

## 2021-01-19 DIAGNOSIS — M6281 Muscle weakness (generalized): Secondary | ICD-10-CM | POA: Diagnosis not present

## 2021-01-20 DIAGNOSIS — I739 Peripheral vascular disease, unspecified: Secondary | ICD-10-CM | POA: Diagnosis not present

## 2021-01-20 DIAGNOSIS — M6281 Muscle weakness (generalized): Secondary | ICD-10-CM | POA: Diagnosis not present

## 2021-01-21 DIAGNOSIS — I739 Peripheral vascular disease, unspecified: Secondary | ICD-10-CM | POA: Diagnosis not present

## 2021-01-21 DIAGNOSIS — M6281 Muscle weakness (generalized): Secondary | ICD-10-CM | POA: Diagnosis not present

## 2021-01-22 DIAGNOSIS — I739 Peripheral vascular disease, unspecified: Secondary | ICD-10-CM | POA: Diagnosis not present

## 2021-01-22 DIAGNOSIS — M6281 Muscle weakness (generalized): Secondary | ICD-10-CM | POA: Diagnosis not present

## 2021-01-23 DIAGNOSIS — R159 Full incontinence of feces: Secondary | ICD-10-CM | POA: Diagnosis not present

## 2021-01-23 DIAGNOSIS — R32 Unspecified urinary incontinence: Secondary | ICD-10-CM | POA: Diagnosis not present

## 2021-01-23 DIAGNOSIS — M6281 Muscle weakness (generalized): Secondary | ICD-10-CM | POA: Diagnosis not present

## 2021-01-23 DIAGNOSIS — I739 Peripheral vascular disease, unspecified: Secondary | ICD-10-CM | POA: Diagnosis not present

## 2021-01-24 DIAGNOSIS — M6281 Muscle weakness (generalized): Secondary | ICD-10-CM | POA: Diagnosis not present

## 2021-01-24 DIAGNOSIS — I739 Peripheral vascular disease, unspecified: Secondary | ICD-10-CM | POA: Diagnosis not present

## 2021-01-25 DIAGNOSIS — M6281 Muscle weakness (generalized): Secondary | ICD-10-CM | POA: Diagnosis not present

## 2021-01-25 DIAGNOSIS — I739 Peripheral vascular disease, unspecified: Secondary | ICD-10-CM | POA: Diagnosis not present

## 2021-01-26 DIAGNOSIS — M6281 Muscle weakness (generalized): Secondary | ICD-10-CM | POA: Diagnosis not present

## 2021-01-26 DIAGNOSIS — I739 Peripheral vascular disease, unspecified: Secondary | ICD-10-CM | POA: Diagnosis not present

## 2021-01-27 DIAGNOSIS — M6281 Muscle weakness (generalized): Secondary | ICD-10-CM | POA: Diagnosis not present

## 2021-01-27 DIAGNOSIS — I739 Peripheral vascular disease, unspecified: Secondary | ICD-10-CM | POA: Diagnosis not present

## 2021-01-28 DIAGNOSIS — M6281 Muscle weakness (generalized): Secondary | ICD-10-CM | POA: Diagnosis not present

## 2021-01-28 DIAGNOSIS — I739 Peripheral vascular disease, unspecified: Secondary | ICD-10-CM | POA: Diagnosis not present

## 2021-01-29 DIAGNOSIS — M6281 Muscle weakness (generalized): Secondary | ICD-10-CM | POA: Diagnosis not present

## 2021-01-29 DIAGNOSIS — I739 Peripheral vascular disease, unspecified: Secondary | ICD-10-CM | POA: Diagnosis not present

## 2021-01-30 DIAGNOSIS — E559 Vitamin D deficiency, unspecified: Secondary | ICD-10-CM | POA: Diagnosis not present

## 2021-01-30 DIAGNOSIS — E785 Hyperlipidemia, unspecified: Secondary | ICD-10-CM | POA: Diagnosis not present

## 2021-01-30 DIAGNOSIS — E119 Type 2 diabetes mellitus without complications: Secondary | ICD-10-CM | POA: Diagnosis not present

## 2021-01-30 DIAGNOSIS — I10 Essential (primary) hypertension: Secondary | ICD-10-CM | POA: Diagnosis not present

## 2021-01-30 DIAGNOSIS — R1310 Dysphagia, unspecified: Secondary | ICD-10-CM | POA: Diagnosis not present

## 2021-01-30 DIAGNOSIS — M6281 Muscle weakness (generalized): Secondary | ICD-10-CM | POA: Diagnosis not present

## 2021-01-30 DIAGNOSIS — D508 Other iron deficiency anemias: Secondary | ICD-10-CM | POA: Diagnosis not present

## 2021-01-30 DIAGNOSIS — D518 Other vitamin B12 deficiency anemias: Secondary | ICD-10-CM | POA: Diagnosis not present

## 2021-01-30 DIAGNOSIS — N400 Enlarged prostate without lower urinary tract symptoms: Secondary | ICD-10-CM | POA: Diagnosis not present

## 2021-01-30 DIAGNOSIS — E038 Other specified hypothyroidism: Secondary | ICD-10-CM | POA: Diagnosis not present

## 2021-01-31 DIAGNOSIS — R1310 Dysphagia, unspecified: Secondary | ICD-10-CM | POA: Diagnosis not present

## 2021-01-31 DIAGNOSIS — M6281 Muscle weakness (generalized): Secondary | ICD-10-CM | POA: Diagnosis not present

## 2021-02-01 DIAGNOSIS — R1310 Dysphagia, unspecified: Secondary | ICD-10-CM | POA: Diagnosis not present

## 2021-02-01 DIAGNOSIS — M6281 Muscle weakness (generalized): Secondary | ICD-10-CM | POA: Diagnosis not present

## 2021-02-02 DIAGNOSIS — M6281 Muscle weakness (generalized): Secondary | ICD-10-CM | POA: Diagnosis not present

## 2021-02-02 DIAGNOSIS — R1310 Dysphagia, unspecified: Secondary | ICD-10-CM | POA: Diagnosis not present

## 2021-02-03 DIAGNOSIS — R1310 Dysphagia, unspecified: Secondary | ICD-10-CM | POA: Diagnosis not present

## 2021-02-03 DIAGNOSIS — M6281 Muscle weakness (generalized): Secondary | ICD-10-CM | POA: Diagnosis not present

## 2021-02-04 DIAGNOSIS — R1310 Dysphagia, unspecified: Secondary | ICD-10-CM | POA: Diagnosis not present

## 2021-02-04 DIAGNOSIS — M6281 Muscle weakness (generalized): Secondary | ICD-10-CM | POA: Diagnosis not present

## 2021-02-05 DIAGNOSIS — R1310 Dysphagia, unspecified: Secondary | ICD-10-CM | POA: Diagnosis not present

## 2021-02-05 DIAGNOSIS — M6281 Muscle weakness (generalized): Secondary | ICD-10-CM | POA: Diagnosis not present

## 2021-02-06 DIAGNOSIS — R1310 Dysphagia, unspecified: Secondary | ICD-10-CM | POA: Diagnosis not present

## 2021-02-06 DIAGNOSIS — M6281 Muscle weakness (generalized): Secondary | ICD-10-CM | POA: Diagnosis not present

## 2021-02-06 DIAGNOSIS — R739 Hyperglycemia, unspecified: Secondary | ICD-10-CM | POA: Diagnosis not present

## 2021-02-06 DIAGNOSIS — I10 Essential (primary) hypertension: Secondary | ICD-10-CM | POA: Diagnosis not present

## 2021-02-06 DIAGNOSIS — I6990 Unspecified sequelae of unspecified cerebrovascular disease: Secondary | ICD-10-CM | POA: Diagnosis not present

## 2021-02-06 DIAGNOSIS — D508 Other iron deficiency anemias: Secondary | ICD-10-CM | POA: Diagnosis not present

## 2021-02-06 DIAGNOSIS — N400 Enlarged prostate without lower urinary tract symptoms: Secondary | ICD-10-CM | POA: Diagnosis not present

## 2021-02-07 DIAGNOSIS — M6281 Muscle weakness (generalized): Secondary | ICD-10-CM | POA: Diagnosis not present

## 2021-02-07 DIAGNOSIS — E038 Other specified hypothyroidism: Secondary | ICD-10-CM | POA: Diagnosis not present

## 2021-02-07 DIAGNOSIS — D518 Other vitamin B12 deficiency anemias: Secondary | ICD-10-CM | POA: Diagnosis not present

## 2021-02-07 DIAGNOSIS — R1310 Dysphagia, unspecified: Secondary | ICD-10-CM | POA: Diagnosis not present

## 2021-02-07 DIAGNOSIS — E119 Type 2 diabetes mellitus without complications: Secondary | ICD-10-CM | POA: Diagnosis not present

## 2021-02-07 DIAGNOSIS — E559 Vitamin D deficiency, unspecified: Secondary | ICD-10-CM | POA: Diagnosis not present

## 2021-02-07 DIAGNOSIS — E7849 Other hyperlipidemia: Secondary | ICD-10-CM | POA: Diagnosis not present

## 2021-02-07 DIAGNOSIS — Z79899 Other long term (current) drug therapy: Secondary | ICD-10-CM | POA: Diagnosis not present

## 2021-02-08 DIAGNOSIS — M6281 Muscle weakness (generalized): Secondary | ICD-10-CM | POA: Diagnosis not present

## 2021-02-08 DIAGNOSIS — R1310 Dysphagia, unspecified: Secondary | ICD-10-CM | POA: Diagnosis not present

## 2021-02-09 DIAGNOSIS — R1310 Dysphagia, unspecified: Secondary | ICD-10-CM | POA: Diagnosis not present

## 2021-02-09 DIAGNOSIS — M6281 Muscle weakness (generalized): Secondary | ICD-10-CM | POA: Diagnosis not present

## 2021-02-10 DIAGNOSIS — M6281 Muscle weakness (generalized): Secondary | ICD-10-CM | POA: Diagnosis not present

## 2021-02-10 DIAGNOSIS — R1310 Dysphagia, unspecified: Secondary | ICD-10-CM | POA: Diagnosis not present

## 2021-02-11 DIAGNOSIS — R1310 Dysphagia, unspecified: Secondary | ICD-10-CM | POA: Diagnosis not present

## 2021-02-11 DIAGNOSIS — M6281 Muscle weakness (generalized): Secondary | ICD-10-CM | POA: Diagnosis not present

## 2021-02-12 DIAGNOSIS — R1310 Dysphagia, unspecified: Secondary | ICD-10-CM | POA: Diagnosis not present

## 2021-02-12 DIAGNOSIS — M6281 Muscle weakness (generalized): Secondary | ICD-10-CM | POA: Diagnosis not present

## 2021-02-13 DIAGNOSIS — R1310 Dysphagia, unspecified: Secondary | ICD-10-CM | POA: Diagnosis not present

## 2021-02-13 DIAGNOSIS — M6281 Muscle weakness (generalized): Secondary | ICD-10-CM | POA: Diagnosis not present

## 2021-02-14 DIAGNOSIS — M6281 Muscle weakness (generalized): Secondary | ICD-10-CM | POA: Diagnosis not present

## 2021-02-14 DIAGNOSIS — R1310 Dysphagia, unspecified: Secondary | ICD-10-CM | POA: Diagnosis not present

## 2021-02-15 DIAGNOSIS — R1310 Dysphagia, unspecified: Secondary | ICD-10-CM | POA: Diagnosis not present

## 2021-02-15 DIAGNOSIS — M6281 Muscle weakness (generalized): Secondary | ICD-10-CM | POA: Diagnosis not present

## 2021-02-16 DIAGNOSIS — M6281 Muscle weakness (generalized): Secondary | ICD-10-CM | POA: Diagnosis not present

## 2021-02-16 DIAGNOSIS — R1310 Dysphagia, unspecified: Secondary | ICD-10-CM | POA: Diagnosis not present

## 2021-02-17 DIAGNOSIS — M6281 Muscle weakness (generalized): Secondary | ICD-10-CM | POA: Diagnosis not present

## 2021-02-17 DIAGNOSIS — R1310 Dysphagia, unspecified: Secondary | ICD-10-CM | POA: Diagnosis not present

## 2021-02-18 DIAGNOSIS — M6281 Muscle weakness (generalized): Secondary | ICD-10-CM | POA: Diagnosis not present

## 2021-02-18 DIAGNOSIS — R1310 Dysphagia, unspecified: Secondary | ICD-10-CM | POA: Diagnosis not present

## 2021-02-19 DIAGNOSIS — M6281 Muscle weakness (generalized): Secondary | ICD-10-CM | POA: Diagnosis not present

## 2021-02-19 DIAGNOSIS — R1310 Dysphagia, unspecified: Secondary | ICD-10-CM | POA: Diagnosis not present

## 2021-02-20 DIAGNOSIS — Z79899 Other long term (current) drug therapy: Secondary | ICD-10-CM | POA: Diagnosis not present

## 2021-02-20 DIAGNOSIS — R32 Unspecified urinary incontinence: Secondary | ICD-10-CM | POA: Diagnosis not present

## 2021-02-20 DIAGNOSIS — R1310 Dysphagia, unspecified: Secondary | ICD-10-CM | POA: Diagnosis not present

## 2021-02-20 DIAGNOSIS — R159 Full incontinence of feces: Secondary | ICD-10-CM | POA: Diagnosis not present

## 2021-02-20 DIAGNOSIS — M6281 Muscle weakness (generalized): Secondary | ICD-10-CM | POA: Diagnosis not present

## 2021-02-20 DIAGNOSIS — D518 Other vitamin B12 deficiency anemias: Secondary | ICD-10-CM | POA: Diagnosis not present

## 2021-02-21 DIAGNOSIS — R1310 Dysphagia, unspecified: Secondary | ICD-10-CM | POA: Diagnosis not present

## 2021-02-21 DIAGNOSIS — M6281 Muscle weakness (generalized): Secondary | ICD-10-CM | POA: Diagnosis not present

## 2021-02-22 DIAGNOSIS — M6281 Muscle weakness (generalized): Secondary | ICD-10-CM | POA: Diagnosis not present

## 2021-02-22 DIAGNOSIS — R1310 Dysphagia, unspecified: Secondary | ICD-10-CM | POA: Diagnosis not present

## 2021-02-23 DIAGNOSIS — R1310 Dysphagia, unspecified: Secondary | ICD-10-CM | POA: Diagnosis not present

## 2021-02-23 DIAGNOSIS — M6281 Muscle weakness (generalized): Secondary | ICD-10-CM | POA: Diagnosis not present

## 2021-02-24 DIAGNOSIS — R1310 Dysphagia, unspecified: Secondary | ICD-10-CM | POA: Diagnosis not present

## 2021-02-24 DIAGNOSIS — M6281 Muscle weakness (generalized): Secondary | ICD-10-CM | POA: Diagnosis not present

## 2021-02-25 DIAGNOSIS — M6281 Muscle weakness (generalized): Secondary | ICD-10-CM | POA: Diagnosis not present

## 2021-02-25 DIAGNOSIS — R1310 Dysphagia, unspecified: Secondary | ICD-10-CM | POA: Diagnosis not present

## 2021-02-26 DIAGNOSIS — M6281 Muscle weakness (generalized): Secondary | ICD-10-CM | POA: Diagnosis not present

## 2021-02-26 DIAGNOSIS — R1310 Dysphagia, unspecified: Secondary | ICD-10-CM | POA: Diagnosis not present

## 2021-02-27 DIAGNOSIS — M6281 Muscle weakness (generalized): Secondary | ICD-10-CM | POA: Diagnosis not present

## 2021-02-27 DIAGNOSIS — R1310 Dysphagia, unspecified: Secondary | ICD-10-CM | POA: Diagnosis not present

## 2021-02-28 DIAGNOSIS — M6281 Muscle weakness (generalized): Secondary | ICD-10-CM | POA: Diagnosis not present

## 2021-02-28 DIAGNOSIS — R1310 Dysphagia, unspecified: Secondary | ICD-10-CM | POA: Diagnosis not present

## 2021-03-01 DIAGNOSIS — R1310 Dysphagia, unspecified: Secondary | ICD-10-CM | POA: Diagnosis not present

## 2021-03-01 DIAGNOSIS — M6281 Muscle weakness (generalized): Secondary | ICD-10-CM | POA: Diagnosis not present

## 2021-03-02 DIAGNOSIS — M6281 Muscle weakness (generalized): Secondary | ICD-10-CM | POA: Diagnosis not present

## 2021-03-02 DIAGNOSIS — R1310 Dysphagia, unspecified: Secondary | ICD-10-CM | POA: Diagnosis not present

## 2021-03-03 DIAGNOSIS — M6281 Muscle weakness (generalized): Secondary | ICD-10-CM | POA: Diagnosis not present

## 2021-03-03 DIAGNOSIS — R1310 Dysphagia, unspecified: Secondary | ICD-10-CM | POA: Diagnosis not present

## 2021-03-04 DIAGNOSIS — M6281 Muscle weakness (generalized): Secondary | ICD-10-CM | POA: Diagnosis not present

## 2021-03-04 DIAGNOSIS — R1310 Dysphagia, unspecified: Secondary | ICD-10-CM | POA: Diagnosis not present

## 2021-03-05 DIAGNOSIS — M6281 Muscle weakness (generalized): Secondary | ICD-10-CM | POA: Diagnosis not present

## 2021-03-05 DIAGNOSIS — R1310 Dysphagia, unspecified: Secondary | ICD-10-CM | POA: Diagnosis not present

## 2021-03-06 DIAGNOSIS — N400 Enlarged prostate without lower urinary tract symptoms: Secondary | ICD-10-CM | POA: Diagnosis not present

## 2021-03-06 DIAGNOSIS — I10 Essential (primary) hypertension: Secondary | ICD-10-CM | POA: Diagnosis not present

## 2021-03-06 DIAGNOSIS — R2681 Unsteadiness on feet: Secondary | ICD-10-CM | POA: Diagnosis not present

## 2021-03-06 DIAGNOSIS — D508 Other iron deficiency anemias: Secondary | ICD-10-CM | POA: Diagnosis not present

## 2021-03-06 DIAGNOSIS — R1310 Dysphagia, unspecified: Secondary | ICD-10-CM | POA: Diagnosis not present

## 2021-03-06 DIAGNOSIS — M6281 Muscle weakness (generalized): Secondary | ICD-10-CM | POA: Diagnosis not present

## 2021-03-06 DIAGNOSIS — I6990 Unspecified sequelae of unspecified cerebrovascular disease: Secondary | ICD-10-CM | POA: Diagnosis not present

## 2021-03-07 DIAGNOSIS — M6281 Muscle weakness (generalized): Secondary | ICD-10-CM | POA: Diagnosis not present

## 2021-03-07 DIAGNOSIS — R1310 Dysphagia, unspecified: Secondary | ICD-10-CM | POA: Diagnosis not present

## 2021-03-08 DIAGNOSIS — R1310 Dysphagia, unspecified: Secondary | ICD-10-CM | POA: Diagnosis not present

## 2021-03-08 DIAGNOSIS — M6281 Muscle weakness (generalized): Secondary | ICD-10-CM | POA: Diagnosis not present

## 2021-03-09 DIAGNOSIS — R1310 Dysphagia, unspecified: Secondary | ICD-10-CM | POA: Diagnosis not present

## 2021-03-09 DIAGNOSIS — M6281 Muscle weakness (generalized): Secondary | ICD-10-CM | POA: Diagnosis not present

## 2021-03-10 DIAGNOSIS — R1310 Dysphagia, unspecified: Secondary | ICD-10-CM | POA: Diagnosis not present

## 2021-03-10 DIAGNOSIS — M6281 Muscle weakness (generalized): Secondary | ICD-10-CM | POA: Diagnosis not present

## 2021-03-11 DIAGNOSIS — M6281 Muscle weakness (generalized): Secondary | ICD-10-CM | POA: Diagnosis not present

## 2021-03-11 DIAGNOSIS — R1310 Dysphagia, unspecified: Secondary | ICD-10-CM | POA: Diagnosis not present

## 2021-03-12 DIAGNOSIS — M6281 Muscle weakness (generalized): Secondary | ICD-10-CM | POA: Diagnosis not present

## 2021-03-12 DIAGNOSIS — R1310 Dysphagia, unspecified: Secondary | ICD-10-CM | POA: Diagnosis not present

## 2021-03-13 DIAGNOSIS — R1310 Dysphagia, unspecified: Secondary | ICD-10-CM | POA: Diagnosis not present

## 2021-03-13 DIAGNOSIS — M6281 Muscle weakness (generalized): Secondary | ICD-10-CM | POA: Diagnosis not present

## 2021-03-14 DIAGNOSIS — M6281 Muscle weakness (generalized): Secondary | ICD-10-CM | POA: Diagnosis not present

## 2021-03-14 DIAGNOSIS — R1310 Dysphagia, unspecified: Secondary | ICD-10-CM | POA: Diagnosis not present

## 2021-03-15 DIAGNOSIS — B351 Tinea unguium: Secondary | ICD-10-CM | POA: Diagnosis not present

## 2021-03-15 DIAGNOSIS — R1310 Dysphagia, unspecified: Secondary | ICD-10-CM | POA: Diagnosis not present

## 2021-03-15 DIAGNOSIS — M6281 Muscle weakness (generalized): Secondary | ICD-10-CM | POA: Diagnosis not present

## 2021-03-16 DIAGNOSIS — R1310 Dysphagia, unspecified: Secondary | ICD-10-CM | POA: Diagnosis not present

## 2021-03-16 DIAGNOSIS — M6281 Muscle weakness (generalized): Secondary | ICD-10-CM | POA: Diagnosis not present

## 2021-03-17 ENCOUNTER — Encounter: Payer: Self-pay | Admitting: *Deleted

## 2021-03-17 ENCOUNTER — Emergency Department: Payer: Medicare HMO

## 2021-03-17 ENCOUNTER — Other Ambulatory Visit: Payer: Self-pay

## 2021-03-17 ENCOUNTER — Emergency Department
Admission: EM | Admit: 2021-03-17 | Discharge: 2021-03-17 | Disposition: A | Discharge status: Home / Self Care | Payer: Medicare HMO | Attending: Emergency Medicine | Admitting: Emergency Medicine

## 2021-03-17 DIAGNOSIS — N189 Chronic kidney disease, unspecified: Secondary | ICD-10-CM | POA: Diagnosis not present

## 2021-03-17 DIAGNOSIS — I129 Hypertensive chronic kidney disease with stage 1 through stage 4 chronic kidney disease, or unspecified chronic kidney disease: Secondary | ICD-10-CM | POA: Diagnosis not present

## 2021-03-17 DIAGNOSIS — M549 Dorsalgia, unspecified: Secondary | ICD-10-CM | POA: Diagnosis not present

## 2021-03-17 DIAGNOSIS — R1310 Dysphagia, unspecified: Secondary | ICD-10-CM | POA: Diagnosis not present

## 2021-03-17 DIAGNOSIS — Z7902 Long term (current) use of antithrombotics/antiplatelets: Secondary | ICD-10-CM | POA: Diagnosis not present

## 2021-03-17 DIAGNOSIS — R109 Unspecified abdominal pain: Secondary | ICD-10-CM | POA: Diagnosis not present

## 2021-03-17 DIAGNOSIS — Z8546 Personal history of malignant neoplasm of prostate: Secondary | ICD-10-CM | POA: Diagnosis not present

## 2021-03-17 DIAGNOSIS — R1030 Lower abdominal pain, unspecified: Secondary | ICD-10-CM | POA: Diagnosis not present

## 2021-03-17 DIAGNOSIS — I10 Essential (primary) hypertension: Secondary | ICD-10-CM | POA: Diagnosis not present

## 2021-03-17 DIAGNOSIS — I714 Abdominal aortic aneurysm, without rupture: Secondary | ICD-10-CM | POA: Insufficient documentation

## 2021-03-17 DIAGNOSIS — N390 Urinary tract infection, site not specified: Secondary | ICD-10-CM

## 2021-03-17 DIAGNOSIS — Z79899 Other long term (current) drug therapy: Secondary | ICD-10-CM | POA: Diagnosis not present

## 2021-03-17 DIAGNOSIS — Z87891 Personal history of nicotine dependence: Secondary | ICD-10-CM | POA: Diagnosis not present

## 2021-03-17 DIAGNOSIS — M6281 Muscle weakness (generalized): Secondary | ICD-10-CM | POA: Diagnosis not present

## 2021-03-17 DIAGNOSIS — Z743 Need for continuous supervision: Secondary | ICD-10-CM | POA: Diagnosis not present

## 2021-03-17 LAB — CBC
HCT: 36.5 % — ABNORMAL LOW (ref 39.0–52.0)
Hemoglobin: 11.7 g/dL — ABNORMAL LOW (ref 13.0–17.0)
MCH: 29.5 pg (ref 26.0–34.0)
MCHC: 32.1 g/dL (ref 30.0–36.0)
MCV: 92.2 fL (ref 80.0–100.0)
Platelets: 321 10*3/uL (ref 150–400)
RBC: 3.96 MIL/uL — ABNORMAL LOW (ref 4.22–5.81)
RDW: 17.4 % — ABNORMAL HIGH (ref 11.5–15.5)
WBC: 9.8 10*3/uL (ref 4.0–10.5)
nRBC: 0 % (ref 0.0–0.2)

## 2021-03-17 LAB — URINALYSIS, COMPLETE (UACMP) WITH MICROSCOPIC
Bilirubin Urine: NEGATIVE
Glucose, UA: NEGATIVE mg/dL
Ketones, ur: NEGATIVE mg/dL
Nitrite: NEGATIVE
Protein, ur: NEGATIVE mg/dL
Specific Gravity, Urine: 1.012 (ref 1.005–1.030)
pH: 5 (ref 5.0–8.0)

## 2021-03-17 LAB — COMPREHENSIVE METABOLIC PANEL
ALT: 10 U/L (ref 0–44)
AST: 12 U/L — ABNORMAL LOW (ref 15–41)
Albumin: 4 g/dL (ref 3.5–5.0)
Alkaline Phosphatase: 77 U/L (ref 38–126)
Anion gap: 8 (ref 5–15)
BUN: 24 mg/dL — ABNORMAL HIGH (ref 8–23)
CO2: 24 mmol/L (ref 22–32)
Calcium: 9.4 mg/dL (ref 8.9–10.3)
Chloride: 105 mmol/L (ref 98–111)
Creatinine, Ser: 1.52 mg/dL — ABNORMAL HIGH (ref 0.61–1.24)
GFR, Estimated: 43 mL/min — ABNORMAL LOW (ref >60)
Glucose, Bld: 110 mg/dL — ABNORMAL HIGH (ref 70–99)
Potassium: 3.9 mmol/L (ref 3.5–5.1)
Sodium: 137 mmol/L (ref 135–145)
Total Bilirubin: 1 mg/dL (ref 0.3–1.2)
Total Protein: 8.3 g/dL — ABNORMAL HIGH (ref 6.5–8.1)

## 2021-03-17 LAB — LIPASE, BLOOD: Lipase: 43 U/L (ref 11–51)

## 2021-03-17 MED ORDER — SODIUM CHLORIDE 0.9 % IV BOLUS
1000.0000 mL | Freq: Once | INTRAVENOUS | Status: AC
  Administered 2021-03-17: 1000 mL via INTRAVENOUS

## 2021-03-17 MED ORDER — IOHEXOL 300 MG/ML  SOLN
75.0000 mL | Freq: Once | INTRAMUSCULAR | Status: AC | PRN
  Administered 2021-03-17: 75 mL via INTRAVENOUS

## 2021-03-17 MED ORDER — ONDANSETRON HCL 4 MG/2ML IJ SOLN
4.0000 mg | Freq: Once | INTRAMUSCULAR | Status: AC
  Administered 2021-03-17: 4 mg via INTRAVENOUS
  Filled 2021-03-17: qty 2

## 2021-03-17 MED ORDER — MORPHINE SULFATE (PF) 4 MG/ML IV SOLN
4.0000 mg | Freq: Once | INTRAVENOUS | Status: AC
  Administered 2021-03-17: 4 mg via INTRAVENOUS
  Filled 2021-03-17: qty 1

## 2021-03-17 MED ORDER — FOSFOMYCIN TROMETHAMINE 3 G PO PACK
3.0000 g | PACK | Freq: Once | ORAL | Status: AC
  Administered 2021-03-17: 3 g via ORAL
  Filled 2021-03-17: qty 3

## 2021-03-17 NOTE — ED Notes (Signed)
Pt in CT.

## 2021-03-17 NOTE — ED Triage Notes (Signed)
Patient is hard of hearing, c/o lower abdominal pain that worsens with movement. Patient appears uncomfortable.

## 2021-03-17 NOTE — ED Notes (Signed)
EDP at bedside  

## 2021-03-17 NOTE — ED Notes (Signed)
[facility] no longer needed.

## 2021-03-17 NOTE — ED Notes (Signed)
Pt taken to CT.

## 2021-03-17 NOTE — ED Provider Notes (Signed)
7:11 PM Assumed care for off going team.   Blood pressure (!) 179/73, pulse 83, temperature 98.3 F (36.8 C), temperature source Oral, resp. rate (!) 25, height 5\' 9"  (1.753 m), weight 68 kg, SpO2 100 %.  See their HPI for full report but in brief CT, bladder scan, re-eval   UA concerning for UTI  Cr stable  Post void normal  CT infiltrated x2 but able to get some dye the second time so CT preformed   CT scan does show infrarenal aortic aneurysm slightly larger than previous I did discuss this with patient as well as son.  Reevaluated patient and his pain is mostly just with movement.  I discussed with son that Tylenol would be the safest thing for him.  His urine does have some concerning signs for possible UTI we will give 1 dose of fosfomycin given he is otherwise pretty well-appearing I have low suspicion for sepsis.  At this time we will discharge patient back to facility         Vanessa McNabb, MD 03/17/21 2009

## 2021-03-17 NOTE — ED Notes (Signed)
Pt assisted back into bed. ariel ed tech to sit with pt for safety until discharge.

## 2021-03-17 NOTE — ED Triage Notes (Signed)
Pt son in car, would like to be called when he can come to room

## 2021-03-17 NOTE — Discharge Instructions (Addendum)
IMPRESSION:  Take tylenol 1g every 8 hours for pain. I think there is some muscle pain since the pain is worse with movement. CT is negative other then incidental findings below.  We already treated him for a UTI.  He does not need additional antibiotics.  However if he starts developing fevers or burning when he pees he is to return to the ER.  His blood pressure was elevated and he will need to follow this up with his primary care doctor   1. No acute process in the abdomen or pelvis. 2. Infrarenal abdominal aortic aneurysm measuring 3.7 x 3.7 cm. This is increased since 12/11/2015 when it measured 3.0 x 3.0 cm. Recommend follow-up ultrasound every 2 years. This recommendation follows ACR consensus guidelines: White Paper of the ACR Incidental Findings Committee II on Vascular Findings. J Am Coll Radiol 2013; 10:789-794. 3. Intravenous contrast extravasated during the exam and the patient only received a portion of the normal intravenous contrast dose.

## 2021-03-17 NOTE — ED Provider Notes (Signed)
[facility] Emergency Department Provider Note  ____________________________________________  Time seen: Approximately 3:52 PM  I have reviewed the triage vital signs and the nursing notes.   HISTORY  Chief Complaint Abdominal Pain    HPI Jon Smith is a 87 y.o. male with a history of hypertension, prostate cancer, hyperlipidemia, who comes the ED complaining of lower abdominal pain for the past 2 weeks, gradual onset, waxing waning, worse with movement, no alleviating factors.  Nonradiating.  Denies dysuria or difficulty urinating.  Denies falls, denies head injury or neck pain, no chest pain shortness of breath or fever.  Pain is described as crampy and severe.      Past Medical History:  Diagnosis Date  . Back pain   . Elevated LFTs   . Hepatitis   . HLD (hyperlipidemia)   . Hypertension   . Incomplete bladder emptying   . Lower extremity ulceration ([place])   . Nocturia   . Peripheral arterial disease ([place])   . Prostate cancer ([place]) remote   Cryotherapy by Dr. Ernst Spell.  PSA 5.54 in September at [facility]  . Renal stones      Patient Active Problem List   Diagnosis Date Noted  . Renovascular hypertension 10/13/2020  . CKD (chronic kidney disease) 07/25/2020  . Disease characterized by destruction of skeletal muscle 07/25/2020  . Elevated AST (SGOT) 07/25/2020  . Fall 07/25/2020  . Hyperkalemia 07/25/2020  . Urinary retention 07/25/2020  . Generalized muscle weakness 08/18/2019  . Asymptomatic proteinuria 02/22/2019  . Chronic anemia 02/21/2019  . High risk medication use 02/21/2019  . Scalp hematoma 04/28/2018  . Urine test positive for microalbuminuria 08/27/2017  . Neck pain 07/01/2017  . Prostate cancer ([place]) 01/16/2016  . Elevated PSA 01/16/2016  . Protein-calorie malnutrition, severe 12/12/2015  . Gross hematuria   . Acute hepatitis 12/08/2015  . Elevated LFTs   . Abnormal liver function tests 12/06/2015  . H/O malignant neoplasm  of prostate 09/18/2015  . Hypercholesteremia 05/17/2014  . BP (high blood pressure) 05/17/2014  . Photodermatitis due to sun 05/17/2014     Past Surgical History:  Procedure Laterality Date  . AORTOGRAM    . PERITONEAL CATHETER INSERTION    . PROSTATE SURGERY    . TRANSLUMINAL ANGIOPLASTY       Prior to Admission medications   Medication Sig Start Date End Date Taking? Authorizing Provider  atorvastatin (LIPITOR) 40 MG tablet Take 40 mg by mouth daily. 10/12/20   [provider]  clopidogrel (PLAVIX) 75 MG tablet Take by mouth. 08/02/20   [provider]  finasteride (PROSCAR) 5 MG tablet Take 1 tablet (5 mg total) by mouth daily. 12/18/15   Roda Shutters, FNP  latanoprost (XALATAN) 0.005 % ophthalmic solution  09/23/15   [provider]  lisinopril (PRINIVIL,ZESTRIL) 20 MG tablet  09/25/15   [provider]  metoprolol tartrate (LOPRESSOR) 25 MG tablet Take 25 mg by mouth 2 (two) times daily. 10/12/20   [provider]  tamsulosin (FLOMAX) 0.4 MG CAPS capsule Take 1 capsule (0.4 mg total) by mouth daily. At bedtime 09/27/15   Roda Shutters, FNP     Allergies Aspirin and Sulfamethoxazole-trimethoprim   Family History  Problem Relation Age of Onset  . Heart disease Brother   . Kidney Stones Sister   . Heart attack Sister   . Heart attack Brother   . Kidney disease Neg Hx   . Prostate cancer Neg Hx     Social History Social  History   Tobacco Use  . Smoking status: Former Smoker    Types: Cigarettes  . Smokeless tobacco: Never Used  . Tobacco comment: quit 10 days  Substance Use Topics  . Alcohol use: No    Alcohol/week: 0.0 standard drinks  . Drug use: No    Review of Systems  Constitutional:   No fever or chills.  ENT:   No sore throat. No rhinorrhea. Cardiovascular:   No chest pain or syncope. Respiratory:   No dyspnea or cough. Gastrointestinal:   Positive as above for abdominal pain without vomiting and  diarrhea.  Musculoskeletal:   Negative for focal pain or swelling All other systems reviewed and are negative except as documented above in ROS and HPI.  ____________________________________________   PHYSICAL EXAM:  VITAL SIGNS: ED Triage Vitals  Enc Vitals Group     BP 03/17/21 1046 (!) 183/75     Pulse Rate 03/17/21 1046 63     Resp 03/17/21 1046 (!) 22     Temp 03/17/21 1046 98.3 F (36.8 C)     Temp Source 03/17/21 1046 Oral     SpO2 03/17/21 1046 99 %     Weight 03/17/21 1049 150 lb (68 kg)     Height 03/17/21 1049 5\' 9"  (1.753 m)     Head Circumference --      Peak Flow --      Pain Score 03/17/21 1047 7     Pain Loc --      Pain Edu? --      Excl. in [place]? --     Vital signs reviewed, nursing assessments reviewed.   Constitutional:   Alert and oriented. Non-toxic appearance. Eyes:   Conjunctivae are normal. EOMI. PERRL. ENT      Head:   Normocephalic and atraumatic.      Nose:   Wearing a mask.      Mouth/Throat:   Wearing a mask.      Neck:   No meningismus. Full ROM. Hematological/Lymphatic/Immunilogical:   No cervical lymphadenopathy. Cardiovascular:   RRR. Symmetric bilateral radial and DP pulses.  No murmurs. Cap refill less than 2 seconds. Respiratory:   Normal respiratory effort without tachypnea/retractions. Breath sounds are clear and equal bilaterally. No wheezes/rales/rhonchi. Gastrointestinal:   Soft with diffuse lower abdominal tenderness. Non distended. There is no CVA tenderness.  No rebound, rigidity, or guarding.  Musculoskeletal:   Normal range of motion in all extremities. No joint effusions.  No lower extremity tenderness.  No edema. Neurologic:   Normal speech and language.  Motor grossly intact. No acute focal neurologic deficits are appreciated.  Skin:    Skin is warm, dry and intact. No rash noted.  No petechiae, purpura, or bullae.  ____________________________________________    LABS (pertinent positives/negatives) (all labs  ordered are listed, but only abnormal results are displayed) Labs Reviewed  COMPREHENSIVE METABOLIC PANEL - Abnormal; Notable for the following components:      Result Value   Glucose, Bld 110 (*)    BUN 24 (*)    Creatinine, Ser 1.52 (*)    Total Protein 8.3 (*)    AST 12 (*)    GFR, Estimated 43 (*)    All other components within normal limits  CBC - Abnormal; Notable for the following components:   RBC 3.96 (*)    Hemoglobin 11.7 (*)    HCT 36.5 (*)    RDW 17.4 (*)    All other components within normal limits  LIPASE, BLOOD  URINALYSIS, COMPLETE (UACMP) WITH MICROSCOPIC   ____________________________________________   EKG    ____________________________________________    RADIOLOGY  No results found.  ____________________________________________   PROCEDURES Procedures  ____________________________________________  DIFFERENTIAL DIAGNOSIS   Bowel obstruction, diverticulitis, cystitis, urinary retention, appendicitis  CLINICAL IMPRESSION / ASSESSMENT AND PLAN / ED COURSE  Medications ordered in the ED: Medications  morphine 4 MG/ML injection 4 mg (has no administration in time range)  ondansetron (ZOFRAN) injection 4 mg (has no administration in time range)  sodium chloride 0.9 % bolus 1,000 mL (has no administration in time range)    Pertinent labs & imaging results that were available during my care of the patient were reviewed by me and considered in my medical decision making (see chart for details).  Jon Smith was evaluated in Emergency Department on 03/17/2021 for the symptoms described in the history of present illness. He was evaluated in the context of the global COVID-19 pandemic, which necessitated consideration that the patient might be at risk for infection with the SARS-CoV-2 virus that causes COVID-19. Institutional protocols and algorithms that pertain to the evaluation of patients at risk for COVID-19 are in a state of rapid change based  on information released by regulatory bodies including the CDC and federal and state organizations. These policies and algorithms were followed during the patient's care in the ED.   Patient presents with lower abdominal pain and tenderness, vital signs unremarkable except for uncontrolled hypertension.  Not septic.  Not in distress.  Will give IV morphine 4 mg for pain control, obtain CT scan.  Lab work-up is reassuring.      ____________________________________________   FINAL CLINICAL IMPRESSION(S) / ED DIAGNOSES    Final diagnoses:  Lower abdominal pain     ED Discharge Orders    None      Portions of this note were generated with dragon dictation software. Dictation errors may occur despite best attempts at proofreading.   Carrie Mew, MD 03/17/21 [phone]

## 2021-03-17 NOTE — ED Notes (Signed)
Report to the [facility] [place]

## 2021-03-17 NOTE — ED Triage Notes (Signed)
Pt in via EMS from [facility] with c/o lower back pain that worsened over the last couple of weeks. Pt denies fall or pain with urination. 169/84, 100%, HR 60

## 2021-03-17 NOTE — ED Notes (Signed)
Pt unable to urinate at time of triage.

## 2021-03-17 NOTE — ED Notes (Signed)
[facility] to transport to [facility] ?

## 2021-03-17 NOTE — ED Notes (Signed)
Patient back from CT, CT tech advised line blew during CT. Partial dose given. Notify doctor.

## 2021-03-18 DIAGNOSIS — M6281 Muscle weakness (generalized): Secondary | ICD-10-CM | POA: Diagnosis not present

## 2021-03-18 DIAGNOSIS — R1310 Dysphagia, unspecified: Secondary | ICD-10-CM | POA: Diagnosis not present

## 2021-03-19 DIAGNOSIS — R1310 Dysphagia, unspecified: Secondary | ICD-10-CM | POA: Diagnosis not present

## 2021-03-19 DIAGNOSIS — M6281 Muscle weakness (generalized): Secondary | ICD-10-CM | POA: Diagnosis not present

## 2021-03-20 DIAGNOSIS — R1310 Dysphagia, unspecified: Secondary | ICD-10-CM | POA: Diagnosis not present

## 2021-03-20 DIAGNOSIS — M6281 Muscle weakness (generalized): Secondary | ICD-10-CM | POA: Diagnosis not present

## 2021-03-20 LAB — URINE CULTURE: Culture: >=100000 — AB

## 2021-03-21 DIAGNOSIS — R1310 Dysphagia, unspecified: Secondary | ICD-10-CM | POA: Diagnosis not present

## 2021-03-21 DIAGNOSIS — M6281 Muscle weakness (generalized): Secondary | ICD-10-CM | POA: Diagnosis not present

## 2021-03-22 DIAGNOSIS — R1310 Dysphagia, unspecified: Secondary | ICD-10-CM | POA: Diagnosis not present

## 2021-03-22 DIAGNOSIS — M6281 Muscle weakness (generalized): Secondary | ICD-10-CM | POA: Diagnosis not present

## 2021-03-23 DIAGNOSIS — R159 Full incontinence of feces: Secondary | ICD-10-CM | POA: Diagnosis not present

## 2021-03-23 DIAGNOSIS — M6281 Muscle weakness (generalized): Secondary | ICD-10-CM | POA: Diagnosis not present

## 2021-03-23 DIAGNOSIS — R32 Unspecified urinary incontinence: Secondary | ICD-10-CM | POA: Diagnosis not present

## 2021-03-23 DIAGNOSIS — R1310 Dysphagia, unspecified: Secondary | ICD-10-CM | POA: Diagnosis not present

## 2021-03-24 DIAGNOSIS — E785 Hyperlipidemia, unspecified: Secondary | ICD-10-CM | POA: Diagnosis not present

## 2021-03-24 DIAGNOSIS — M6281 Muscle weakness (generalized): Secondary | ICD-10-CM | POA: Diagnosis not present

## 2021-03-24 DIAGNOSIS — E559 Vitamin D deficiency, unspecified: Secondary | ICD-10-CM | POA: Diagnosis not present

## 2021-03-24 DIAGNOSIS — D518 Other vitamin B12 deficiency anemias: Secondary | ICD-10-CM | POA: Diagnosis not present

## 2021-03-24 DIAGNOSIS — E119 Type 2 diabetes mellitus without complications: Secondary | ICD-10-CM | POA: Diagnosis not present

## 2021-03-24 DIAGNOSIS — N400 Enlarged prostate without lower urinary tract symptoms: Secondary | ICD-10-CM | POA: Diagnosis not present

## 2021-03-24 DIAGNOSIS — E038 Other specified hypothyroidism: Secondary | ICD-10-CM | POA: Diagnosis not present

## 2021-03-24 DIAGNOSIS — D508 Other iron deficiency anemias: Secondary | ICD-10-CM | POA: Diagnosis not present

## 2021-03-24 DIAGNOSIS — R1310 Dysphagia, unspecified: Secondary | ICD-10-CM | POA: Diagnosis not present

## 2021-03-24 DIAGNOSIS — I10 Essential (primary) hypertension: Secondary | ICD-10-CM | POA: Diagnosis not present

## 2021-03-25 DIAGNOSIS — R1310 Dysphagia, unspecified: Secondary | ICD-10-CM | POA: Diagnosis not present

## 2021-03-25 DIAGNOSIS — M6281 Muscle weakness (generalized): Secondary | ICD-10-CM | POA: Diagnosis not present

## 2021-03-26 DIAGNOSIS — M6281 Muscle weakness (generalized): Secondary | ICD-10-CM | POA: Diagnosis not present

## 2021-03-26 DIAGNOSIS — R1310 Dysphagia, unspecified: Secondary | ICD-10-CM | POA: Diagnosis not present

## 2021-03-27 DIAGNOSIS — R1310 Dysphagia, unspecified: Secondary | ICD-10-CM | POA: Diagnosis not present

## 2021-03-27 DIAGNOSIS — M6281 Muscle weakness (generalized): Secondary | ICD-10-CM | POA: Diagnosis not present

## 2021-03-28 DIAGNOSIS — R3914 Feeling of incomplete bladder emptying: Secondary | ICD-10-CM | POA: Diagnosis not present

## 2021-03-28 DIAGNOSIS — M549 Dorsalgia, unspecified: Secondary | ICD-10-CM | POA: Diagnosis not present

## 2021-03-28 DIAGNOSIS — R2681 Unsteadiness on feet: Secondary | ICD-10-CM | POA: Diagnosis not present

## 2021-03-28 DIAGNOSIS — R1310 Dysphagia, unspecified: Secondary | ICD-10-CM | POA: Diagnosis not present

## 2021-03-28 DIAGNOSIS — R1030 Lower abdominal pain, unspecified: Secondary | ICD-10-CM | POA: Diagnosis not present

## 2021-03-28 DIAGNOSIS — N189 Chronic kidney disease, unspecified: Secondary | ICD-10-CM | POA: Diagnosis not present

## 2021-03-28 DIAGNOSIS — E785 Hyperlipidemia, unspecified: Secondary | ICD-10-CM | POA: Diagnosis not present

## 2021-03-28 DIAGNOSIS — Z8546 Personal history of malignant neoplasm of prostate: Secondary | ICD-10-CM | POA: Diagnosis not present

## 2021-03-28 DIAGNOSIS — M6281 Muscle weakness (generalized): Secondary | ICD-10-CM | POA: Diagnosis not present

## 2021-03-28 DIAGNOSIS — I70203 Unspecified atherosclerosis of native arteries of extremities, bilateral legs: Secondary | ICD-10-CM | POA: Diagnosis not present

## 2021-03-28 DIAGNOSIS — R351 Nocturia: Secondary | ICD-10-CM | POA: Diagnosis not present

## 2021-03-28 DIAGNOSIS — I69354 Hemiplegia and hemiparesis following cerebral infarction affecting left non-dominant side: Secondary | ICD-10-CM | POA: Diagnosis not present

## 2021-03-28 DIAGNOSIS — M6282 Rhabdomyolysis: Secondary | ICD-10-CM | POA: Diagnosis not present

## 2021-03-28 DIAGNOSIS — N390 Urinary tract infection, site not specified: Secondary | ICD-10-CM | POA: Diagnosis not present

## 2021-03-28 DIAGNOSIS — I10 Essential (primary) hypertension: Secondary | ICD-10-CM | POA: Diagnosis not present

## 2021-03-29 DIAGNOSIS — R1310 Dysphagia, unspecified: Secondary | ICD-10-CM | POA: Diagnosis not present

## 2021-03-29 DIAGNOSIS — M6281 Muscle weakness (generalized): Secondary | ICD-10-CM | POA: Diagnosis not present

## 2021-03-30 DIAGNOSIS — M6281 Muscle weakness (generalized): Secondary | ICD-10-CM | POA: Diagnosis not present

## 2021-03-30 DIAGNOSIS — R1310 Dysphagia, unspecified: Secondary | ICD-10-CM | POA: Diagnosis not present

## 2021-03-31 DIAGNOSIS — R1310 Dysphagia, unspecified: Secondary | ICD-10-CM | POA: Diagnosis not present

## 2021-03-31 DIAGNOSIS — M6281 Muscle weakness (generalized): Secondary | ICD-10-CM | POA: Diagnosis not present

## 2021-04-01 DIAGNOSIS — R1310 Dysphagia, unspecified: Secondary | ICD-10-CM | POA: Diagnosis not present

## 2021-04-01 DIAGNOSIS — M6281 Muscle weakness (generalized): Secondary | ICD-10-CM | POA: Diagnosis not present

## 2021-04-02 DIAGNOSIS — R1310 Dysphagia, unspecified: Secondary | ICD-10-CM | POA: Diagnosis not present

## 2021-04-02 DIAGNOSIS — M6281 Muscle weakness (generalized): Secondary | ICD-10-CM | POA: Diagnosis not present

## 2021-04-03 DIAGNOSIS — R1310 Dysphagia, unspecified: Secondary | ICD-10-CM | POA: Diagnosis not present

## 2021-04-03 DIAGNOSIS — M6281 Muscle weakness (generalized): Secondary | ICD-10-CM | POA: Diagnosis not present

## 2021-04-04 DIAGNOSIS — R1310 Dysphagia, unspecified: Secondary | ICD-10-CM | POA: Diagnosis not present

## 2021-04-04 DIAGNOSIS — M6281 Muscle weakness (generalized): Secondary | ICD-10-CM | POA: Diagnosis not present

## 2021-04-05 DIAGNOSIS — M6281 Muscle weakness (generalized): Secondary | ICD-10-CM | POA: Diagnosis not present

## 2021-04-05 DIAGNOSIS — R1310 Dysphagia, unspecified: Secondary | ICD-10-CM | POA: Diagnosis not present

## 2021-04-06 DIAGNOSIS — M6281 Muscle weakness (generalized): Secondary | ICD-10-CM | POA: Diagnosis not present

## 2021-04-06 DIAGNOSIS — R1310 Dysphagia, unspecified: Secondary | ICD-10-CM | POA: Diagnosis not present

## 2021-04-07 DIAGNOSIS — M6281 Muscle weakness (generalized): Secondary | ICD-10-CM | POA: Diagnosis not present

## 2021-04-07 DIAGNOSIS — R1310 Dysphagia, unspecified: Secondary | ICD-10-CM | POA: Diagnosis not present

## 2021-04-08 DIAGNOSIS — M6281 Muscle weakness (generalized): Secondary | ICD-10-CM | POA: Diagnosis not present

## 2021-04-08 DIAGNOSIS — R1310 Dysphagia, unspecified: Secondary | ICD-10-CM | POA: Diagnosis not present

## 2021-04-09 DIAGNOSIS — M6281 Muscle weakness (generalized): Secondary | ICD-10-CM | POA: Diagnosis not present

## 2021-04-09 DIAGNOSIS — R1310 Dysphagia, unspecified: Secondary | ICD-10-CM | POA: Diagnosis not present

## 2021-04-10 DIAGNOSIS — R1310 Dysphagia, unspecified: Secondary | ICD-10-CM | POA: Diagnosis not present

## 2021-04-10 DIAGNOSIS — N400 Enlarged prostate without lower urinary tract symptoms: Secondary | ICD-10-CM | POA: Diagnosis not present

## 2021-04-10 DIAGNOSIS — M6281 Muscle weakness (generalized): Secondary | ICD-10-CM | POA: Diagnosis not present

## 2021-04-10 DIAGNOSIS — E785 Hyperlipidemia, unspecified: Secondary | ICD-10-CM | POA: Diagnosis not present

## 2021-04-10 DIAGNOSIS — I6990 Unspecified sequelae of unspecified cerebrovascular disease: Secondary | ICD-10-CM | POA: Diagnosis not present

## 2021-04-10 DIAGNOSIS — D508 Other iron deficiency anemias: Secondary | ICD-10-CM | POA: Diagnosis not present

## 2021-04-10 DIAGNOSIS — I10 Essential (primary) hypertension: Secondary | ICD-10-CM | POA: Diagnosis not present

## 2021-04-11 DIAGNOSIS — R1310 Dysphagia, unspecified: Secondary | ICD-10-CM | POA: Diagnosis not present

## 2021-04-11 DIAGNOSIS — M6281 Muscle weakness (generalized): Secondary | ICD-10-CM | POA: Diagnosis not present

## 2021-04-12 DIAGNOSIS — R1310 Dysphagia, unspecified: Secondary | ICD-10-CM | POA: Diagnosis not present

## 2021-04-12 DIAGNOSIS — M6281 Muscle weakness (generalized): Secondary | ICD-10-CM | POA: Diagnosis not present

## 2021-04-13 DIAGNOSIS — M6281 Muscle weakness (generalized): Secondary | ICD-10-CM | POA: Diagnosis not present

## 2021-04-13 DIAGNOSIS — R1310 Dysphagia, unspecified: Secondary | ICD-10-CM | POA: Diagnosis not present

## 2021-04-14 DIAGNOSIS — R1310 Dysphagia, unspecified: Secondary | ICD-10-CM | POA: Diagnosis not present

## 2021-04-14 DIAGNOSIS — M6281 Muscle weakness (generalized): Secondary | ICD-10-CM | POA: Diagnosis not present

## 2021-04-15 DIAGNOSIS — R1310 Dysphagia, unspecified: Secondary | ICD-10-CM | POA: Diagnosis not present

## 2021-04-15 DIAGNOSIS — M6281 Muscle weakness (generalized): Secondary | ICD-10-CM | POA: Diagnosis not present

## 2021-04-16 DIAGNOSIS — R1310 Dysphagia, unspecified: Secondary | ICD-10-CM | POA: Diagnosis not present

## 2021-04-16 DIAGNOSIS — M6281 Muscle weakness (generalized): Secondary | ICD-10-CM | POA: Diagnosis not present

## 2021-04-17 DIAGNOSIS — R1310 Dysphagia, unspecified: Secondary | ICD-10-CM | POA: Diagnosis not present

## 2021-04-17 DIAGNOSIS — M6281 Muscle weakness (generalized): Secondary | ICD-10-CM | POA: Diagnosis not present

## 2021-04-17 NOTE — Progress Notes (Signed)
04/18/2021  2:10 PM   Jon Smith 30-Sep-1928 [id number]  Referring provider: [facility] [address] [place],  [place] [address] Chief Complaint  Patient presents with  . Prostate Cancer    HPI: Jon Smith is a 87 y.o. male with a personal history of CKD, gross hematuria, nocturia, incomplete bladder emptying, elevated PSA and prostate cancer, who presents today for 6 month PSA evaluation. He was accompanied today by his son today.  He has a personal history of prostate cancer s/p cryotherapy by Dr. Einar Gip years ago in the 13s.   He does have personal history of gross hematuria s/p cystoscopy 2017 indicating large median lobe. He also had CT Urogram in 11/2015 showing non obstructing kidney stones on left, bladder trabeculation and right bladder diverticulum. He is chronically on finsteride and flomax.   PSA trend: 5.44 - 08/2015 6.39 - 01/2018 10.73 - 08/18/2019 10.51 - 09/17/2019 9.95 - 04/14/2020  Patient was recently in the ER on 03/17/2021 for "crampy and severe" lower abdominal pain x2 weeks gradual onset, intermittent, provoked by movement, and no alleviating factors. No specific urinary symptoms. He was diagnosed with an unspecified UTI. ER note said that his UA was concerning for a UTI. CR was stable, post void normal. CT scan which did not show any GU pathology other than nonobstructing stones.  Today PSA is pending.  Patient reported today that he feels like he is achieving complete bladder emptying.  PVR today 230 cc.   PMH: Past Medical History:  Diagnosis Date  . Back pain   . Elevated LFTs   . Hepatitis   . HLD (hyperlipidemia)   . Hypertension   . Incomplete bladder emptying   . Lower extremity ulceration ([place])   . Nocturia   . Peripheral arterial disease ([place])   . Prostate cancer ([place]) remote   Cryotherapy by Dr. Ernst Spell.  PSA 5.54 in September at [facility]  . Renal stones     Surgical History: Past Surgical History:  Procedure  Laterality Date  . AORTOGRAM    . PERITONEAL CATHETER INSERTION    . PROSTATE SURGERY    . TRANSLUMINAL ANGIOPLASTY      Home Medications:  Allergies as of 04/18/2021      Reactions   Aspirin Nausea And Vomiting   Sulfamethoxazole-trimethoprim    Other reaction(s): Blood Disorder Coagulopathy      Medication List       Accurate as of April 18, 2021  2:10 PM. If you have any questions, ask your nurse or doctor.        Aspirin Low Dose 81 MG EC tablet Generic drug: aspirin Take 81 mg by mouth daily.   atorvastatin 40 MG tablet Commonly known as: LIPITOR Take 40 mg by mouth daily.   clopidogrel 75 MG tablet Commonly known as: PLAVIX Take by mouth.   ferrous sulfate 325 (65 FE) MG EC tablet Take 1 tablet by mouth daily.   finasteride 5 MG tablet Commonly known as: PROSCAR Take 1 tablet (5 mg total) by mouth daily.   latanoprost 0.005 % ophthalmic solution Commonly known as: XALATAN   lisinopril 20 MG tablet Commonly known as: ZESTRIL   metoprolol tartrate 25 MG tablet Commonly known as: LOPRESSOR Take 25 mg by mouth 2 (two) times daily.   tamsulosin 0.4 MG Caps capsule Commonly known as: FLOMAX Take 1 capsule (0.4 mg total) by mouth daily. At bedtime       Allergies: Allergies  Allergen Reactions  .  Aspirin Nausea And Vomiting  . Sulfamethoxazole-Trimethoprim     Other reaction(s): Blood Disorder Coagulopathy    Family History: Family History  Problem Relation Age of Onset  . Heart disease Brother   . Kidney Stones Sister   . Heart attack Sister   . Heart attack Brother   . Kidney disease Neg Hx   . Prostate cancer Neg Hx     Social History:   reports that he has quit smoking. His smoking use included cigarettes. He has never used smokeless tobacco. He reports that he does not drink alcohol and does not use drugs.  ROS: Pertinent ROS in HPI.  Physical Exam: BP (!) 148/66   Pulse 72   Ht 5\' 9"  (1.753 m)   BMI 22.15 kg/m    Constitutional:  Alert and oriented, No acute distress.  Elderly, in wheelchair, son providing much of the history today.  Patient states he feels well. HEENT: [place] AT, moist mucus membranes.  Trachea midline, no masses. Cardiovascular: No clubbing, cyanosis, or edema. Respiratory: Normal respiratory effort, no increased work of breathing. Skin: No rashes, bruises or suspicious lesions. Neurologic: Grossly intact, no focal deficits, moving all 4 extremities. Psychiatric: Normal mood and affect.  Laboratory Data:   Urine culture: Component 1 mo ago  Specimen Description URINE, RANDOM  Performed at [facility], [address] [place], [place] [address]   Special Requests NONE  Performed at [facility], [address] [place], [place] [address]   Culture >=100,000 COLONIES/mL ESCHERICHIA COLIAbnormal   Report Status 03/20/2021 FINAL   Organism ID, Bacteria ESCHERICHIA COLIAbnormal    Urinalysis, complete w microscopic urine Component Ref Range & Units 1 mo ago  (03/17/21) 1 yr ago  (04/14/20) 1 yr ago  (09/17/19) 5 yr ago  (01/16/16) 5 yr ago  (01/16/16) 5 yr ago  (01/09/16) 5 yr ago  (01/09/16)  Color, Urine YELLOW YELLOWAbnormal  STRAWAbnormal  YELLOW       APPearance CLEAR HAZYAbnormal  CLOUDYAbnormal  CLOUDYAbnormal       Specific Gravity, Urine 1.005 - 1.030 1.012  1.020  1.015       pH 5.0 - 8.0 5.0  5.0  5.0       Glucose, UA NEGATIVE mg/dL NEGATIVE  NEGATIVE  NEGATIVE   Negative R   Negative R   Hgb urine dipstick NEGATIVE SMALLAbnormal  SMALLAbnormal  TRACEAbnormal       Bilirubin Urine NEGATIVE NEGATIVE  NEGATIVE  NEGATIVE       Ketones, ur NEGATIVE mg/dL NEGATIVE  NEGATIVE  NEGATIVE       Protein, ur NEGATIVE mg/dL NEGATIVE  NEGATIVE  NEGATIVE       Nitrite NEGATIVE NEGATIVE  NEGATIVE  NEGATIVE       Leukocytes,Ua NEGATIVE MODERATEAbnormal  LARGEAbnormal  MODERATEAbnormal       RBC / HPF 0 - 5 RBC/hpf 0-5  6-10  0-5        WBC, UA 0 - 5 WBC/hpf 21-50  >50  >50  0-5 R   11-30Abnormal R    Bacteria, UA NONE SEEN RAREAbnormal  MANYAbnormal CM  MANYAbnormal  None seen R   ManyAbnormal R    Squamous Epithelial / LPF 0 - 5 0-5  0-5  0-5       Mucus  PRESENT           Lab Results  Component Value Date   CREATININE 1.52 (H) 03/17/2021     Assessment & Plan:  1. Chronic kidney stones multiple non obstructing stones noted on most recent scan Asymptomatic, only treat if symptomatic Will follow clinically  2. Elevated PSA/personal history of prostate cancer Recheck today, if stable recheck in a year. No evidence of metastatic disease on most recent scan, reassuring Given age and comorbidities as well as lack of symptoms, will manage conservatively with serial PSA Call with PSA tomorrow  3.  BPH with incomplete bladder emptying Continue Flomax and finasteride Mildly elevated postvoid residual, stable from previous continue to check PVR  4. Asymptomatic bacturia Only treat if symptomatic  Follow Up:  F/u 6 months with PVR   I, Ardyth Gal, am acting as a scribe for Dr. Hollice Espy.   I have reviewed the above documentation for accuracy and completeness, and I agree with the above.   Hollice Espy, MD   [facility] [address], [place],  [address] [phone]

## 2021-04-18 ENCOUNTER — Encounter: Payer: Self-pay | Admitting: Urology

## 2021-04-18 ENCOUNTER — Other Ambulatory Visit: Payer: Self-pay

## 2021-04-18 ENCOUNTER — Ambulatory Visit (INDEPENDENT_AMBULATORY_CARE_PROVIDER_SITE_OTHER): Payer: Medicare HMO | Admitting: Urology

## 2021-04-18 VITALS — BP 148/66 | HR 72 | Ht 69.0 in

## 2021-04-18 DIAGNOSIS — N200 Calculus of kidney: Secondary | ICD-10-CM

## 2021-04-18 DIAGNOSIS — Z8546 Personal history of malignant neoplasm of prostate: Secondary | ICD-10-CM | POA: Diagnosis not present

## 2021-04-18 DIAGNOSIS — R8271 Bacteriuria: Secondary | ICD-10-CM | POA: Diagnosis not present

## 2021-04-18 DIAGNOSIS — R1310 Dysphagia, unspecified: Secondary | ICD-10-CM | POA: Diagnosis not present

## 2021-04-18 DIAGNOSIS — N401 Enlarged prostate with lower urinary tract symptoms: Secondary | ICD-10-CM | POA: Diagnosis not present

## 2021-04-18 DIAGNOSIS — M6281 Muscle weakness (generalized): Secondary | ICD-10-CM | POA: Diagnosis not present

## 2021-04-18 DIAGNOSIS — N138 Other obstructive and reflux uropathy: Secondary | ICD-10-CM | POA: Diagnosis not present

## 2021-04-19 ENCOUNTER — Telehealth: Payer: Self-pay

## 2021-04-19 DIAGNOSIS — M6281 Muscle weakness (generalized): Secondary | ICD-10-CM | POA: Diagnosis not present

## 2021-04-19 DIAGNOSIS — R1310 Dysphagia, unspecified: Secondary | ICD-10-CM | POA: Diagnosis not present

## 2021-04-19 LAB — PSA: Prostate Specific Ag, Serum: 1.7 ng/mL (ref 0.0–4.0)

## 2021-04-19 NOTE — Telephone Encounter (Signed)
-----   Message from Hollice Espy, MD sent at 04/19/2021  8:18 AM EDT ----- Please let this patient and/or his son know that his PSA has come down remarkably to 1.7.  This is awesome news.  We are still going to continue to monitor his urinary symptoms and bladder emptying, follow-up as scheduled.  Hollice Espy, MD

## 2021-04-20 DIAGNOSIS — M6281 Muscle weakness (generalized): Secondary | ICD-10-CM | POA: Diagnosis not present

## 2021-04-20 DIAGNOSIS — R1310 Dysphagia, unspecified: Secondary | ICD-10-CM | POA: Diagnosis not present

## 2021-04-20 LAB — BLADDER SCAN AMB NON-IMAGING: Scan Result: 230

## 2021-04-20 NOTE — Telephone Encounter (Signed)
Pt's son returned call and I read message

## 2021-04-21 DIAGNOSIS — R1310 Dysphagia, unspecified: Secondary | ICD-10-CM | POA: Diagnosis not present

## 2021-04-21 DIAGNOSIS — M6281 Muscle weakness (generalized): Secondary | ICD-10-CM | POA: Diagnosis not present

## 2021-04-22 DIAGNOSIS — R1310 Dysphagia, unspecified: Secondary | ICD-10-CM | POA: Diagnosis not present

## 2021-04-22 DIAGNOSIS — M6281 Muscle weakness (generalized): Secondary | ICD-10-CM | POA: Diagnosis not present

## 2021-04-23 DIAGNOSIS — R32 Unspecified urinary incontinence: Secondary | ICD-10-CM | POA: Diagnosis not present

## 2021-04-23 DIAGNOSIS — R159 Full incontinence of feces: Secondary | ICD-10-CM | POA: Diagnosis not present

## 2021-04-23 DIAGNOSIS — R1310 Dysphagia, unspecified: Secondary | ICD-10-CM | POA: Diagnosis not present

## 2021-04-23 DIAGNOSIS — M6281 Muscle weakness (generalized): Secondary | ICD-10-CM | POA: Diagnosis not present

## 2021-04-24 ENCOUNTER — Telehealth: Payer: Self-pay | Admitting: *Deleted

## 2021-04-24 DIAGNOSIS — M6281 Muscle weakness (generalized): Secondary | ICD-10-CM | POA: Diagnosis not present

## 2021-04-24 DIAGNOSIS — R1310 Dysphagia, unspecified: Secondary | ICD-10-CM | POA: Diagnosis not present

## 2021-04-24 DIAGNOSIS — E559 Vitamin D deficiency, unspecified: Secondary | ICD-10-CM | POA: Diagnosis not present

## 2021-04-24 DIAGNOSIS — N400 Enlarged prostate without lower urinary tract symptoms: Secondary | ICD-10-CM | POA: Diagnosis not present

## 2021-04-24 DIAGNOSIS — E785 Hyperlipidemia, unspecified: Secondary | ICD-10-CM | POA: Diagnosis not present

## 2021-04-24 DIAGNOSIS — E119 Type 2 diabetes mellitus without complications: Secondary | ICD-10-CM | POA: Diagnosis not present

## 2021-04-24 DIAGNOSIS — E038 Other specified hypothyroidism: Secondary | ICD-10-CM | POA: Diagnosis not present

## 2021-04-24 DIAGNOSIS — D518 Other vitamin B12 deficiency anemias: Secondary | ICD-10-CM | POA: Diagnosis not present

## 2021-04-24 DIAGNOSIS — D508 Other iron deficiency anemias: Secondary | ICD-10-CM | POA: Diagnosis not present

## 2021-04-24 DIAGNOSIS — I10 Essential (primary) hypertension: Secondary | ICD-10-CM | POA: Diagnosis not present

## 2021-04-24 NOTE — Telephone Encounter (Addendum)
Spoke with Patient's son-informed of recommendations. Son agrees. Voiced understanding.   ----- Message from Hollice Espy, MD sent at 04/19/2021  8:18 AM EDT ----- Please let this patient and/or his son know that his PSA has come down remarkably to 1.7.  This is awesome news.  We are still going to continue to monitor his urinary symptoms and bladder emptying, follow-up as scheduled.  Hollice Espy, MD

## 2021-04-25 DIAGNOSIS — R1310 Dysphagia, unspecified: Secondary | ICD-10-CM | POA: Diagnosis not present

## 2021-04-25 DIAGNOSIS — M6281 Muscle weakness (generalized): Secondary | ICD-10-CM | POA: Diagnosis not present

## 2021-04-26 DIAGNOSIS — R1310 Dysphagia, unspecified: Secondary | ICD-10-CM | POA: Diagnosis not present

## 2021-04-26 DIAGNOSIS — R8290 Unspecified abnormal findings in urine: Secondary | ICD-10-CM | POA: Diagnosis not present

## 2021-04-26 DIAGNOSIS — M6281 Muscle weakness (generalized): Secondary | ICD-10-CM | POA: Diagnosis not present

## 2021-04-27 DIAGNOSIS — R3914 Feeling of incomplete bladder emptying: Secondary | ICD-10-CM | POA: Diagnosis not present

## 2021-04-27 DIAGNOSIS — R1310 Dysphagia, unspecified: Secondary | ICD-10-CM | POA: Diagnosis not present

## 2021-04-27 DIAGNOSIS — I70203 Unspecified atherosclerosis of native arteries of extremities, bilateral legs: Secondary | ICD-10-CM | POA: Diagnosis not present

## 2021-04-27 DIAGNOSIS — I10 Essential (primary) hypertension: Secondary | ICD-10-CM | POA: Diagnosis not present

## 2021-04-27 DIAGNOSIS — M6281 Muscle weakness (generalized): Secondary | ICD-10-CM | POA: Diagnosis not present

## 2021-04-27 DIAGNOSIS — M6282 Rhabdomyolysis: Secondary | ICD-10-CM | POA: Diagnosis not present

## 2021-04-27 DIAGNOSIS — Z8546 Personal history of malignant neoplasm of prostate: Secondary | ICD-10-CM | POA: Diagnosis not present

## 2021-04-27 DIAGNOSIS — N189 Chronic kidney disease, unspecified: Secondary | ICD-10-CM | POA: Diagnosis not present

## 2021-04-27 DIAGNOSIS — R1030 Lower abdominal pain, unspecified: Secondary | ICD-10-CM | POA: Diagnosis not present

## 2021-04-27 DIAGNOSIS — E785 Hyperlipidemia, unspecified: Secondary | ICD-10-CM | POA: Diagnosis not present

## 2021-04-27 DIAGNOSIS — R351 Nocturia: Secondary | ICD-10-CM | POA: Diagnosis not present

## 2021-04-27 DIAGNOSIS — M549 Dorsalgia, unspecified: Secondary | ICD-10-CM | POA: Diagnosis not present

## 2021-04-28 DIAGNOSIS — R1310 Dysphagia, unspecified: Secondary | ICD-10-CM | POA: Diagnosis not present

## 2021-04-28 DIAGNOSIS — M6281 Muscle weakness (generalized): Secondary | ICD-10-CM | POA: Diagnosis not present

## 2021-04-29 DIAGNOSIS — R1310 Dysphagia, unspecified: Secondary | ICD-10-CM | POA: Diagnosis not present

## 2021-04-29 DIAGNOSIS — M6281 Muscle weakness (generalized): Secondary | ICD-10-CM | POA: Diagnosis not present

## 2021-04-30 DIAGNOSIS — R1310 Dysphagia, unspecified: Secondary | ICD-10-CM | POA: Diagnosis not present

## 2021-04-30 DIAGNOSIS — M6281 Muscle weakness (generalized): Secondary | ICD-10-CM | POA: Diagnosis not present

## 2021-05-01 DIAGNOSIS — M6281 Muscle weakness (generalized): Secondary | ICD-10-CM | POA: Diagnosis not present

## 2021-05-01 DIAGNOSIS — N3000 Acute cystitis without hematuria: Secondary | ICD-10-CM | POA: Diagnosis not present

## 2021-05-01 DIAGNOSIS — R1310 Dysphagia, unspecified: Secondary | ICD-10-CM | POA: Diagnosis not present

## 2021-05-02 DIAGNOSIS — M6281 Muscle weakness (generalized): Secondary | ICD-10-CM | POA: Diagnosis not present

## 2021-05-02 DIAGNOSIS — R1310 Dysphagia, unspecified: Secondary | ICD-10-CM | POA: Diagnosis not present

## 2021-05-03 DIAGNOSIS — M6281 Muscle weakness (generalized): Secondary | ICD-10-CM | POA: Diagnosis not present

## 2021-05-03 DIAGNOSIS — R1310 Dysphagia, unspecified: Secondary | ICD-10-CM | POA: Diagnosis not present

## 2021-05-04 DIAGNOSIS — M6281 Muscle weakness (generalized): Secondary | ICD-10-CM | POA: Diagnosis not present

## 2021-05-04 DIAGNOSIS — R1310 Dysphagia, unspecified: Secondary | ICD-10-CM | POA: Diagnosis not present

## 2021-05-05 DIAGNOSIS — M6281 Muscle weakness (generalized): Secondary | ICD-10-CM | POA: Diagnosis not present

## 2021-05-05 DIAGNOSIS — R1310 Dysphagia, unspecified: Secondary | ICD-10-CM | POA: Diagnosis not present

## 2021-05-06 DIAGNOSIS — M6281 Muscle weakness (generalized): Secondary | ICD-10-CM | POA: Diagnosis not present

## 2021-05-06 DIAGNOSIS — R1310 Dysphagia, unspecified: Secondary | ICD-10-CM | POA: Diagnosis not present

## 2021-05-07 DIAGNOSIS — R1310 Dysphagia, unspecified: Secondary | ICD-10-CM | POA: Diagnosis not present

## 2021-05-07 DIAGNOSIS — M6281 Muscle weakness (generalized): Secondary | ICD-10-CM | POA: Diagnosis not present

## 2021-05-08 DIAGNOSIS — R1310 Dysphagia, unspecified: Secondary | ICD-10-CM | POA: Diagnosis not present

## 2021-05-08 DIAGNOSIS — R102 Pelvic and perineal pain: Secondary | ICD-10-CM | POA: Diagnosis not present

## 2021-05-08 DIAGNOSIS — M6281 Muscle weakness (generalized): Secondary | ICD-10-CM | POA: Diagnosis not present

## 2021-05-08 DIAGNOSIS — D508 Other iron deficiency anemias: Secondary | ICD-10-CM | POA: Diagnosis not present

## 2021-05-08 DIAGNOSIS — M25562 Pain in left knee: Secondary | ICD-10-CM | POA: Diagnosis not present

## 2021-05-08 DIAGNOSIS — I10 Essential (primary) hypertension: Secondary | ICD-10-CM | POA: Diagnosis not present

## 2021-05-08 DIAGNOSIS — M25552 Pain in left hip: Secondary | ICD-10-CM | POA: Diagnosis not present

## 2021-05-08 DIAGNOSIS — M79652 Pain in left thigh: Secondary | ICD-10-CM | POA: Diagnosis not present

## 2021-05-08 DIAGNOSIS — N3000 Acute cystitis without hematuria: Secondary | ICD-10-CM | POA: Diagnosis not present

## 2021-05-08 DIAGNOSIS — I6990 Unspecified sequelae of unspecified cerebrovascular disease: Secondary | ICD-10-CM | POA: Diagnosis not present

## 2021-05-09 DIAGNOSIS — M6281 Muscle weakness (generalized): Secondary | ICD-10-CM | POA: Diagnosis not present

## 2021-05-09 DIAGNOSIS — R1310 Dysphagia, unspecified: Secondary | ICD-10-CM | POA: Diagnosis not present

## 2021-05-10 DIAGNOSIS — M6281 Muscle weakness (generalized): Secondary | ICD-10-CM | POA: Diagnosis not present

## 2021-05-10 DIAGNOSIS — R1310 Dysphagia, unspecified: Secondary | ICD-10-CM | POA: Diagnosis not present

## 2021-05-11 DIAGNOSIS — M6281 Muscle weakness (generalized): Secondary | ICD-10-CM | POA: Diagnosis not present

## 2021-05-11 DIAGNOSIS — R1310 Dysphagia, unspecified: Secondary | ICD-10-CM | POA: Diagnosis not present

## 2021-05-12 DIAGNOSIS — M6281 Muscle weakness (generalized): Secondary | ICD-10-CM | POA: Diagnosis not present

## 2021-05-12 DIAGNOSIS — R1310 Dysphagia, unspecified: Secondary | ICD-10-CM | POA: Diagnosis not present

## 2021-05-13 DIAGNOSIS — R1310 Dysphagia, unspecified: Secondary | ICD-10-CM | POA: Diagnosis not present

## 2021-05-13 DIAGNOSIS — M6281 Muscle weakness (generalized): Secondary | ICD-10-CM | POA: Diagnosis not present

## 2021-05-14 DIAGNOSIS — R1310 Dysphagia, unspecified: Secondary | ICD-10-CM | POA: Diagnosis not present

## 2021-05-14 DIAGNOSIS — M6281 Muscle weakness (generalized): Secondary | ICD-10-CM | POA: Diagnosis not present

## 2021-05-15 DIAGNOSIS — M6281 Muscle weakness (generalized): Secondary | ICD-10-CM | POA: Diagnosis not present

## 2021-05-15 DIAGNOSIS — R1310 Dysphagia, unspecified: Secondary | ICD-10-CM | POA: Diagnosis not present

## 2021-05-16 DIAGNOSIS — R1310 Dysphagia, unspecified: Secondary | ICD-10-CM | POA: Diagnosis not present

## 2021-05-16 DIAGNOSIS — M6281 Muscle weakness (generalized): Secondary | ICD-10-CM | POA: Diagnosis not present

## 2021-05-17 DIAGNOSIS — E559 Vitamin D deficiency, unspecified: Secondary | ICD-10-CM | POA: Diagnosis not present

## 2021-05-17 DIAGNOSIS — M6281 Muscle weakness (generalized): Secondary | ICD-10-CM | POA: Diagnosis not present

## 2021-05-17 DIAGNOSIS — E038 Other specified hypothyroidism: Secondary | ICD-10-CM | POA: Diagnosis not present

## 2021-05-17 DIAGNOSIS — R8290 Unspecified abnormal findings in urine: Secondary | ICD-10-CM | POA: Diagnosis not present

## 2021-05-17 DIAGNOSIS — D518 Other vitamin B12 deficiency anemias: Secondary | ICD-10-CM | POA: Diagnosis not present

## 2021-05-17 DIAGNOSIS — I10 Essential (primary) hypertension: Secondary | ICD-10-CM | POA: Diagnosis not present

## 2021-05-17 DIAGNOSIS — E785 Hyperlipidemia, unspecified: Secondary | ICD-10-CM | POA: Diagnosis not present

## 2021-05-17 DIAGNOSIS — R1310 Dysphagia, unspecified: Secondary | ICD-10-CM | POA: Diagnosis not present

## 2021-05-17 DIAGNOSIS — D508 Other iron deficiency anemias: Secondary | ICD-10-CM | POA: Diagnosis not present

## 2021-05-17 DIAGNOSIS — N400 Enlarged prostate without lower urinary tract symptoms: Secondary | ICD-10-CM | POA: Diagnosis not present

## 2021-05-17 DIAGNOSIS — E119 Type 2 diabetes mellitus without complications: Secondary | ICD-10-CM | POA: Diagnosis not present

## 2021-05-18 DIAGNOSIS — R1310 Dysphagia, unspecified: Secondary | ICD-10-CM | POA: Diagnosis not present

## 2021-05-18 DIAGNOSIS — M6281 Muscle weakness (generalized): Secondary | ICD-10-CM | POA: Diagnosis not present

## 2021-05-19 DIAGNOSIS — M6281 Muscle weakness (generalized): Secondary | ICD-10-CM | POA: Diagnosis not present

## 2021-05-19 DIAGNOSIS — R1310 Dysphagia, unspecified: Secondary | ICD-10-CM | POA: Diagnosis not present

## 2021-05-20 DIAGNOSIS — R1310 Dysphagia, unspecified: Secondary | ICD-10-CM | POA: Diagnosis not present

## 2021-05-20 DIAGNOSIS — M6281 Muscle weakness (generalized): Secondary | ICD-10-CM | POA: Diagnosis not present

## 2021-05-21 DIAGNOSIS — M6281 Muscle weakness (generalized): Secondary | ICD-10-CM | POA: Diagnosis not present

## 2021-05-21 DIAGNOSIS — R1310 Dysphagia, unspecified: Secondary | ICD-10-CM | POA: Diagnosis not present

## 2021-05-22 DIAGNOSIS — R1310 Dysphagia, unspecified: Secondary | ICD-10-CM | POA: Diagnosis not present

## 2021-05-22 DIAGNOSIS — M6281 Muscle weakness (generalized): Secondary | ICD-10-CM | POA: Diagnosis not present

## 2021-05-22 DIAGNOSIS — R159 Full incontinence of feces: Secondary | ICD-10-CM | POA: Diagnosis not present

## 2021-05-22 DIAGNOSIS — R32 Unspecified urinary incontinence: Secondary | ICD-10-CM | POA: Diagnosis not present

## 2021-05-22 DIAGNOSIS — N3000 Acute cystitis without hematuria: Secondary | ICD-10-CM | POA: Diagnosis not present

## 2021-05-23 DIAGNOSIS — M6281 Muscle weakness (generalized): Secondary | ICD-10-CM | POA: Diagnosis not present

## 2021-05-23 DIAGNOSIS — R1310 Dysphagia, unspecified: Secondary | ICD-10-CM | POA: Diagnosis not present

## 2021-05-24 DIAGNOSIS — M6281 Muscle weakness (generalized): Secondary | ICD-10-CM | POA: Diagnosis not present

## 2021-05-24 DIAGNOSIS — R1310 Dysphagia, unspecified: Secondary | ICD-10-CM | POA: Diagnosis not present

## 2021-05-25 DIAGNOSIS — R1310 Dysphagia, unspecified: Secondary | ICD-10-CM | POA: Diagnosis not present

## 2021-05-25 DIAGNOSIS — M6281 Muscle weakness (generalized): Secondary | ICD-10-CM | POA: Diagnosis not present

## 2021-05-26 DIAGNOSIS — M6281 Muscle weakness (generalized): Secondary | ICD-10-CM | POA: Diagnosis not present

## 2021-05-26 DIAGNOSIS — R1310 Dysphagia, unspecified: Secondary | ICD-10-CM | POA: Diagnosis not present

## 2021-05-27 DIAGNOSIS — R1310 Dysphagia, unspecified: Secondary | ICD-10-CM | POA: Diagnosis not present

## 2021-05-27 DIAGNOSIS — N189 Chronic kidney disease, unspecified: Secondary | ICD-10-CM | POA: Diagnosis not present

## 2021-05-27 DIAGNOSIS — R351 Nocturia: Secondary | ICD-10-CM | POA: Diagnosis not present

## 2021-05-27 DIAGNOSIS — I10 Essential (primary) hypertension: Secondary | ICD-10-CM | POA: Diagnosis not present

## 2021-05-27 DIAGNOSIS — E785 Hyperlipidemia, unspecified: Secondary | ICD-10-CM | POA: Diagnosis not present

## 2021-05-27 DIAGNOSIS — M549 Dorsalgia, unspecified: Secondary | ICD-10-CM | POA: Diagnosis not present

## 2021-05-27 DIAGNOSIS — R3914 Feeling of incomplete bladder emptying: Secondary | ICD-10-CM | POA: Diagnosis not present

## 2021-05-27 DIAGNOSIS — M6281 Muscle weakness (generalized): Secondary | ICD-10-CM | POA: Diagnosis not present

## 2021-05-27 DIAGNOSIS — R1030 Lower abdominal pain, unspecified: Secondary | ICD-10-CM | POA: Diagnosis not present

## 2021-05-27 DIAGNOSIS — N390 Urinary tract infection, site not specified: Secondary | ICD-10-CM | POA: Diagnosis not present

## 2021-05-27 DIAGNOSIS — M6282 Rhabdomyolysis: Secondary | ICD-10-CM | POA: Diagnosis not present

## 2021-05-27 DIAGNOSIS — I70203 Unspecified atherosclerosis of native arteries of extremities, bilateral legs: Secondary | ICD-10-CM | POA: Diagnosis not present

## 2021-05-28 DIAGNOSIS — R1310 Dysphagia, unspecified: Secondary | ICD-10-CM | POA: Diagnosis not present

## 2021-05-28 DIAGNOSIS — M6281 Muscle weakness (generalized): Secondary | ICD-10-CM | POA: Diagnosis not present

## 2021-05-29 DIAGNOSIS — R1310 Dysphagia, unspecified: Secondary | ICD-10-CM | POA: Diagnosis not present

## 2021-05-29 DIAGNOSIS — M6281 Muscle weakness (generalized): Secondary | ICD-10-CM | POA: Diagnosis not present

## 2021-05-30 DIAGNOSIS — R1310 Dysphagia, unspecified: Secondary | ICD-10-CM | POA: Diagnosis not present

## 2021-05-30 DIAGNOSIS — M6281 Muscle weakness (generalized): Secondary | ICD-10-CM | POA: Diagnosis not present

## 2021-05-31 DIAGNOSIS — N189 Chronic kidney disease, unspecified: Secondary | ICD-10-CM | POA: Diagnosis not present

## 2021-05-31 DIAGNOSIS — R1310 Dysphagia, unspecified: Secondary | ICD-10-CM | POA: Diagnosis not present

## 2021-05-31 DIAGNOSIS — N390 Urinary tract infection, site not specified: Secondary | ICD-10-CM | POA: Diagnosis not present

## 2021-05-31 DIAGNOSIS — M6282 Rhabdomyolysis: Secondary | ICD-10-CM | POA: Diagnosis not present

## 2021-05-31 DIAGNOSIS — I10 Essential (primary) hypertension: Secondary | ICD-10-CM | POA: Diagnosis not present

## 2021-05-31 DIAGNOSIS — R1030 Lower abdominal pain, unspecified: Secondary | ICD-10-CM | POA: Diagnosis not present

## 2021-05-31 DIAGNOSIS — M6281 Muscle weakness (generalized): Secondary | ICD-10-CM | POA: Diagnosis not present

## 2021-06-01 DIAGNOSIS — M6281 Muscle weakness (generalized): Secondary | ICD-10-CM | POA: Diagnosis not present

## 2021-06-01 DIAGNOSIS — R1310 Dysphagia, unspecified: Secondary | ICD-10-CM | POA: Diagnosis not present

## 2021-06-02 DIAGNOSIS — M6281 Muscle weakness (generalized): Secondary | ICD-10-CM | POA: Diagnosis not present

## 2021-06-02 DIAGNOSIS — R1310 Dysphagia, unspecified: Secondary | ICD-10-CM | POA: Diagnosis not present

## 2021-06-03 DIAGNOSIS — R1310 Dysphagia, unspecified: Secondary | ICD-10-CM | POA: Diagnosis not present

## 2021-06-03 DIAGNOSIS — M6281 Muscle weakness (generalized): Secondary | ICD-10-CM | POA: Diagnosis not present

## 2021-06-04 DIAGNOSIS — R1310 Dysphagia, unspecified: Secondary | ICD-10-CM | POA: Diagnosis not present

## 2021-06-04 DIAGNOSIS — M6281 Muscle weakness (generalized): Secondary | ICD-10-CM | POA: Diagnosis not present

## 2021-06-05 DIAGNOSIS — I10 Essential (primary) hypertension: Secondary | ICD-10-CM | POA: Diagnosis not present

## 2021-06-05 DIAGNOSIS — N400 Enlarged prostate without lower urinary tract symptoms: Secondary | ICD-10-CM | POA: Diagnosis not present

## 2021-06-05 DIAGNOSIS — E559 Vitamin D deficiency, unspecified: Secondary | ICD-10-CM | POA: Diagnosis not present

## 2021-06-05 DIAGNOSIS — R1310 Dysphagia, unspecified: Secondary | ICD-10-CM | POA: Diagnosis not present

## 2021-06-05 DIAGNOSIS — E119 Type 2 diabetes mellitus without complications: Secondary | ICD-10-CM | POA: Diagnosis not present

## 2021-06-05 DIAGNOSIS — M6281 Muscle weakness (generalized): Secondary | ICD-10-CM | POA: Diagnosis not present

## 2021-06-05 DIAGNOSIS — D508 Other iron deficiency anemias: Secondary | ICD-10-CM | POA: Diagnosis not present

## 2021-06-05 DIAGNOSIS — D518 Other vitamin B12 deficiency anemias: Secondary | ICD-10-CM | POA: Diagnosis not present

## 2021-06-05 DIAGNOSIS — E038 Other specified hypothyroidism: Secondary | ICD-10-CM | POA: Diagnosis not present

## 2021-06-05 DIAGNOSIS — I6990 Unspecified sequelae of unspecified cerebrovascular disease: Secondary | ICD-10-CM | POA: Diagnosis not present

## 2021-06-05 DIAGNOSIS — E785 Hyperlipidemia, unspecified: Secondary | ICD-10-CM | POA: Diagnosis not present

## 2021-06-06 DIAGNOSIS — R1310 Dysphagia, unspecified: Secondary | ICD-10-CM | POA: Diagnosis not present

## 2021-06-06 DIAGNOSIS — M6281 Muscle weakness (generalized): Secondary | ICD-10-CM | POA: Diagnosis not present

## 2021-06-07 DIAGNOSIS — M6281 Muscle weakness (generalized): Secondary | ICD-10-CM | POA: Diagnosis not present

## 2021-06-07 DIAGNOSIS — R1310 Dysphagia, unspecified: Secondary | ICD-10-CM | POA: Diagnosis not present

## 2021-06-08 DIAGNOSIS — R1310 Dysphagia, unspecified: Secondary | ICD-10-CM | POA: Diagnosis not present

## 2021-06-08 DIAGNOSIS — M6281 Muscle weakness (generalized): Secondary | ICD-10-CM | POA: Diagnosis not present

## 2021-06-09 DIAGNOSIS — R1310 Dysphagia, unspecified: Secondary | ICD-10-CM | POA: Diagnosis not present

## 2021-06-09 DIAGNOSIS — M6281 Muscle weakness (generalized): Secondary | ICD-10-CM | POA: Diagnosis not present

## 2021-06-10 DIAGNOSIS — R1310 Dysphagia, unspecified: Secondary | ICD-10-CM | POA: Diagnosis not present

## 2021-06-10 DIAGNOSIS — M6281 Muscle weakness (generalized): Secondary | ICD-10-CM | POA: Diagnosis not present

## 2021-06-11 DIAGNOSIS — M6281 Muscle weakness (generalized): Secondary | ICD-10-CM | POA: Diagnosis not present

## 2021-06-11 DIAGNOSIS — R1310 Dysphagia, unspecified: Secondary | ICD-10-CM | POA: Diagnosis not present

## 2021-06-12 DIAGNOSIS — R1310 Dysphagia, unspecified: Secondary | ICD-10-CM | POA: Diagnosis not present

## 2021-06-12 DIAGNOSIS — M6281 Muscle weakness (generalized): Secondary | ICD-10-CM | POA: Diagnosis not present

## 2021-06-13 DIAGNOSIS — R1310 Dysphagia, unspecified: Secondary | ICD-10-CM | POA: Diagnosis not present

## 2021-06-13 DIAGNOSIS — M6281 Muscle weakness (generalized): Secondary | ICD-10-CM | POA: Diagnosis not present

## 2021-06-14 DIAGNOSIS — R1310 Dysphagia, unspecified: Secondary | ICD-10-CM | POA: Diagnosis not present

## 2021-06-14 DIAGNOSIS — M6281 Muscle weakness (generalized): Secondary | ICD-10-CM | POA: Diagnosis not present

## 2021-06-15 DIAGNOSIS — M6281 Muscle weakness (generalized): Secondary | ICD-10-CM | POA: Diagnosis not present

## 2021-06-15 DIAGNOSIS — R1310 Dysphagia, unspecified: Secondary | ICD-10-CM | POA: Diagnosis not present

## 2021-06-16 DIAGNOSIS — M6281 Muscle weakness (generalized): Secondary | ICD-10-CM | POA: Diagnosis not present

## 2021-06-16 DIAGNOSIS — R1310 Dysphagia, unspecified: Secondary | ICD-10-CM | POA: Diagnosis not present

## 2021-06-17 DIAGNOSIS — R1310 Dysphagia, unspecified: Secondary | ICD-10-CM | POA: Diagnosis not present

## 2021-06-17 DIAGNOSIS — M6281 Muscle weakness (generalized): Secondary | ICD-10-CM | POA: Diagnosis not present

## 2021-06-18 DIAGNOSIS — M6281 Muscle weakness (generalized): Secondary | ICD-10-CM | POA: Diagnosis not present

## 2021-06-18 DIAGNOSIS — R1310 Dysphagia, unspecified: Secondary | ICD-10-CM | POA: Diagnosis not present

## 2021-06-19 DIAGNOSIS — M6281 Muscle weakness (generalized): Secondary | ICD-10-CM | POA: Diagnosis not present

## 2021-06-19 DIAGNOSIS — R1310 Dysphagia, unspecified: Secondary | ICD-10-CM | POA: Diagnosis not present

## 2021-06-20 DIAGNOSIS — R1310 Dysphagia, unspecified: Secondary | ICD-10-CM | POA: Diagnosis not present

## 2021-06-20 DIAGNOSIS — M6281 Muscle weakness (generalized): Secondary | ICD-10-CM | POA: Diagnosis not present

## 2021-06-21 DIAGNOSIS — R1310 Dysphagia, unspecified: Secondary | ICD-10-CM | POA: Diagnosis not present

## 2021-06-21 DIAGNOSIS — M6281 Muscle weakness (generalized): Secondary | ICD-10-CM | POA: Diagnosis not present

## 2021-06-22 DIAGNOSIS — M6281 Muscle weakness (generalized): Secondary | ICD-10-CM | POA: Diagnosis not present

## 2021-06-22 DIAGNOSIS — R32 Unspecified urinary incontinence: Secondary | ICD-10-CM | POA: Diagnosis not present

## 2021-06-22 DIAGNOSIS — R1310 Dysphagia, unspecified: Secondary | ICD-10-CM | POA: Diagnosis not present

## 2021-06-22 DIAGNOSIS — R159 Full incontinence of feces: Secondary | ICD-10-CM | POA: Diagnosis not present

## 2021-06-23 DIAGNOSIS — M6281 Muscle weakness (generalized): Secondary | ICD-10-CM | POA: Diagnosis not present

## 2021-06-23 DIAGNOSIS — R1310 Dysphagia, unspecified: Secondary | ICD-10-CM | POA: Diagnosis not present

## 2021-06-24 DIAGNOSIS — M6281 Muscle weakness (generalized): Secondary | ICD-10-CM | POA: Diagnosis not present

## 2021-06-24 DIAGNOSIS — R1310 Dysphagia, unspecified: Secondary | ICD-10-CM | POA: Diagnosis not present

## 2021-06-25 DIAGNOSIS — R1310 Dysphagia, unspecified: Secondary | ICD-10-CM | POA: Diagnosis not present

## 2021-06-25 DIAGNOSIS — M6281 Muscle weakness (generalized): Secondary | ICD-10-CM | POA: Diagnosis not present

## 2021-06-26 DIAGNOSIS — R351 Nocturia: Secondary | ICD-10-CM | POA: Diagnosis not present

## 2021-06-26 DIAGNOSIS — N189 Chronic kidney disease, unspecified: Secondary | ICD-10-CM | POA: Diagnosis not present

## 2021-06-26 DIAGNOSIS — M549 Dorsalgia, unspecified: Secondary | ICD-10-CM | POA: Diagnosis not present

## 2021-06-26 DIAGNOSIS — R3914 Feeling of incomplete bladder emptying: Secondary | ICD-10-CM | POA: Diagnosis not present

## 2021-06-26 DIAGNOSIS — R1310 Dysphagia, unspecified: Secondary | ICD-10-CM | POA: Diagnosis not present

## 2021-06-26 DIAGNOSIS — N390 Urinary tract infection, site not specified: Secondary | ICD-10-CM | POA: Diagnosis not present

## 2021-06-26 DIAGNOSIS — I10 Essential (primary) hypertension: Secondary | ICD-10-CM | POA: Diagnosis not present

## 2021-06-26 DIAGNOSIS — M6282 Rhabdomyolysis: Secondary | ICD-10-CM | POA: Diagnosis not present

## 2021-06-26 DIAGNOSIS — R1030 Lower abdominal pain, unspecified: Secondary | ICD-10-CM | POA: Diagnosis not present

## 2021-06-26 DIAGNOSIS — E785 Hyperlipidemia, unspecified: Secondary | ICD-10-CM | POA: Diagnosis not present

## 2021-06-26 DIAGNOSIS — I70203 Unspecified atherosclerosis of native arteries of extremities, bilateral legs: Secondary | ICD-10-CM | POA: Diagnosis not present

## 2021-06-26 DIAGNOSIS — M6281 Muscle weakness (generalized): Secondary | ICD-10-CM | POA: Diagnosis not present

## 2021-06-27 DIAGNOSIS — R1310 Dysphagia, unspecified: Secondary | ICD-10-CM | POA: Diagnosis not present

## 2021-06-27 DIAGNOSIS — M6281 Muscle weakness (generalized): Secondary | ICD-10-CM | POA: Diagnosis not present

## 2021-06-28 DIAGNOSIS — R1310 Dysphagia, unspecified: Secondary | ICD-10-CM | POA: Diagnosis not present

## 2021-06-28 DIAGNOSIS — M6281 Muscle weakness (generalized): Secondary | ICD-10-CM | POA: Diagnosis not present

## 2021-06-28 DIAGNOSIS — I10 Essential (primary) hypertension: Secondary | ICD-10-CM | POA: Diagnosis not present

## 2021-06-29 DIAGNOSIS — R1310 Dysphagia, unspecified: Secondary | ICD-10-CM | POA: Diagnosis not present

## 2021-06-29 DIAGNOSIS — M6281 Muscle weakness (generalized): Secondary | ICD-10-CM | POA: Diagnosis not present

## 2021-06-30 DIAGNOSIS — R1310 Dysphagia, unspecified: Secondary | ICD-10-CM | POA: Diagnosis not present

## 2021-06-30 DIAGNOSIS — M6281 Muscle weakness (generalized): Secondary | ICD-10-CM | POA: Diagnosis not present

## 2021-07-01 DIAGNOSIS — M6281 Muscle weakness (generalized): Secondary | ICD-10-CM | POA: Diagnosis not present

## 2021-07-01 DIAGNOSIS — R1310 Dysphagia, unspecified: Secondary | ICD-10-CM | POA: Diagnosis not present

## 2021-07-02 DIAGNOSIS — R1310 Dysphagia, unspecified: Secondary | ICD-10-CM | POA: Diagnosis not present

## 2021-07-02 DIAGNOSIS — M6281 Muscle weakness (generalized): Secondary | ICD-10-CM | POA: Diagnosis not present

## 2021-07-03 DIAGNOSIS — M6281 Muscle weakness (generalized): Secondary | ICD-10-CM | POA: Diagnosis not present

## 2021-07-03 DIAGNOSIS — D508 Other iron deficiency anemias: Secondary | ICD-10-CM | POA: Diagnosis not present

## 2021-07-03 DIAGNOSIS — E785 Hyperlipidemia, unspecified: Secondary | ICD-10-CM | POA: Diagnosis not present

## 2021-07-03 DIAGNOSIS — I10 Essential (primary) hypertension: Secondary | ICD-10-CM | POA: Diagnosis not present

## 2021-07-03 DIAGNOSIS — N400 Enlarged prostate without lower urinary tract symptoms: Secondary | ICD-10-CM | POA: Diagnosis not present

## 2021-07-03 DIAGNOSIS — R1310 Dysphagia, unspecified: Secondary | ICD-10-CM | POA: Diagnosis not present

## 2021-07-04 DIAGNOSIS — M6281 Muscle weakness (generalized): Secondary | ICD-10-CM | POA: Diagnosis not present

## 2021-07-04 DIAGNOSIS — R1310 Dysphagia, unspecified: Secondary | ICD-10-CM | POA: Diagnosis not present

## 2021-07-05 DIAGNOSIS — R1310 Dysphagia, unspecified: Secondary | ICD-10-CM | POA: Diagnosis not present

## 2021-07-05 DIAGNOSIS — M6281 Muscle weakness (generalized): Secondary | ICD-10-CM | POA: Diagnosis not present

## 2021-07-06 DIAGNOSIS — R1310 Dysphagia, unspecified: Secondary | ICD-10-CM | POA: Diagnosis not present

## 2021-07-06 DIAGNOSIS — M6281 Muscle weakness (generalized): Secondary | ICD-10-CM | POA: Diagnosis not present

## 2021-07-07 DIAGNOSIS — M6281 Muscle weakness (generalized): Secondary | ICD-10-CM | POA: Diagnosis not present

## 2021-07-07 DIAGNOSIS — R1310 Dysphagia, unspecified: Secondary | ICD-10-CM | POA: Diagnosis not present

## 2021-07-08 DIAGNOSIS — R1310 Dysphagia, unspecified: Secondary | ICD-10-CM | POA: Diagnosis not present

## 2021-07-08 DIAGNOSIS — M6281 Muscle weakness (generalized): Secondary | ICD-10-CM | POA: Diagnosis not present

## 2021-07-09 DIAGNOSIS — D518 Other vitamin B12 deficiency anemias: Secondary | ICD-10-CM | POA: Diagnosis not present

## 2021-07-09 DIAGNOSIS — R1310 Dysphagia, unspecified: Secondary | ICD-10-CM | POA: Diagnosis not present

## 2021-07-09 DIAGNOSIS — E119 Type 2 diabetes mellitus without complications: Secondary | ICD-10-CM | POA: Diagnosis not present

## 2021-07-09 DIAGNOSIS — M6281 Muscle weakness (generalized): Secondary | ICD-10-CM | POA: Diagnosis not present

## 2021-07-09 DIAGNOSIS — I10 Essential (primary) hypertension: Secondary | ICD-10-CM | POA: Diagnosis not present

## 2021-07-09 DIAGNOSIS — E038 Other specified hypothyroidism: Secondary | ICD-10-CM | POA: Diagnosis not present

## 2021-07-09 DIAGNOSIS — N400 Enlarged prostate without lower urinary tract symptoms: Secondary | ICD-10-CM | POA: Diagnosis not present

## 2021-07-09 DIAGNOSIS — E559 Vitamin D deficiency, unspecified: Secondary | ICD-10-CM | POA: Diagnosis not present

## 2021-07-09 DIAGNOSIS — E785 Hyperlipidemia, unspecified: Secondary | ICD-10-CM | POA: Diagnosis not present

## 2021-07-09 DIAGNOSIS — D508 Other iron deficiency anemias: Secondary | ICD-10-CM | POA: Diagnosis not present

## 2021-07-10 DIAGNOSIS — M6281 Muscle weakness (generalized): Secondary | ICD-10-CM | POA: Diagnosis not present

## 2021-07-10 DIAGNOSIS — R1310 Dysphagia, unspecified: Secondary | ICD-10-CM | POA: Diagnosis not present

## 2021-07-11 DIAGNOSIS — M6281 Muscle weakness (generalized): Secondary | ICD-10-CM | POA: Diagnosis not present

## 2021-07-11 DIAGNOSIS — R1310 Dysphagia, unspecified: Secondary | ICD-10-CM | POA: Diagnosis not present

## 2021-07-12 DIAGNOSIS — M6281 Muscle weakness (generalized): Secondary | ICD-10-CM | POA: Diagnosis not present

## 2021-07-12 DIAGNOSIS — R1310 Dysphagia, unspecified: Secondary | ICD-10-CM | POA: Diagnosis not present

## 2021-07-13 DIAGNOSIS — M6281 Muscle weakness (generalized): Secondary | ICD-10-CM | POA: Diagnosis not present

## 2021-07-13 DIAGNOSIS — R1310 Dysphagia, unspecified: Secondary | ICD-10-CM | POA: Diagnosis not present

## 2021-07-14 DIAGNOSIS — R1310 Dysphagia, unspecified: Secondary | ICD-10-CM | POA: Diagnosis not present

## 2021-07-14 DIAGNOSIS — M6281 Muscle weakness (generalized): Secondary | ICD-10-CM | POA: Diagnosis not present

## 2021-07-15 DIAGNOSIS — R1310 Dysphagia, unspecified: Secondary | ICD-10-CM | POA: Diagnosis not present

## 2021-07-15 DIAGNOSIS — M6281 Muscle weakness (generalized): Secondary | ICD-10-CM | POA: Diagnosis not present

## 2021-07-16 DIAGNOSIS — R1310 Dysphagia, unspecified: Secondary | ICD-10-CM | POA: Diagnosis not present

## 2021-07-16 DIAGNOSIS — M6281 Muscle weakness (generalized): Secondary | ICD-10-CM | POA: Diagnosis not present

## 2021-07-17 DIAGNOSIS — M6281 Muscle weakness (generalized): Secondary | ICD-10-CM | POA: Diagnosis not present

## 2021-07-17 DIAGNOSIS — R1310 Dysphagia, unspecified: Secondary | ICD-10-CM | POA: Diagnosis not present

## 2021-07-18 DIAGNOSIS — R1310 Dysphagia, unspecified: Secondary | ICD-10-CM | POA: Diagnosis not present

## 2021-07-18 DIAGNOSIS — M6281 Muscle weakness (generalized): Secondary | ICD-10-CM | POA: Diagnosis not present

## 2021-07-19 DIAGNOSIS — R1310 Dysphagia, unspecified: Secondary | ICD-10-CM | POA: Diagnosis not present

## 2021-07-19 DIAGNOSIS — M6281 Muscle weakness (generalized): Secondary | ICD-10-CM | POA: Diagnosis not present

## 2021-07-20 DIAGNOSIS — R1310 Dysphagia, unspecified: Secondary | ICD-10-CM | POA: Diagnosis not present

## 2021-07-20 DIAGNOSIS — M6281 Muscle weakness (generalized): Secondary | ICD-10-CM | POA: Diagnosis not present

## 2021-07-21 DIAGNOSIS — M6281 Muscle weakness (generalized): Secondary | ICD-10-CM | POA: Diagnosis not present

## 2021-07-21 DIAGNOSIS — R1310 Dysphagia, unspecified: Secondary | ICD-10-CM | POA: Diagnosis not present

## 2021-07-22 DIAGNOSIS — R1310 Dysphagia, unspecified: Secondary | ICD-10-CM | POA: Diagnosis not present

## 2021-07-22 DIAGNOSIS — M6281 Muscle weakness (generalized): Secondary | ICD-10-CM | POA: Diagnosis not present

## 2021-07-23 DIAGNOSIS — R1310 Dysphagia, unspecified: Secondary | ICD-10-CM | POA: Diagnosis not present

## 2021-07-23 DIAGNOSIS — M6281 Muscle weakness (generalized): Secondary | ICD-10-CM | POA: Diagnosis not present

## 2021-07-24 DIAGNOSIS — R1310 Dysphagia, unspecified: Secondary | ICD-10-CM | POA: Diagnosis not present

## 2021-07-24 DIAGNOSIS — M6281 Muscle weakness (generalized): Secondary | ICD-10-CM | POA: Diagnosis not present

## 2021-07-25 DIAGNOSIS — M6281 Muscle weakness (generalized): Secondary | ICD-10-CM | POA: Diagnosis not present

## 2021-07-25 DIAGNOSIS — R1310 Dysphagia, unspecified: Secondary | ICD-10-CM | POA: Diagnosis not present

## 2021-07-26 DIAGNOSIS — R159 Full incontinence of feces: Secondary | ICD-10-CM | POA: Diagnosis not present

## 2021-07-26 DIAGNOSIS — M549 Dorsalgia, unspecified: Secondary | ICD-10-CM | POA: Diagnosis not present

## 2021-07-26 DIAGNOSIS — I10 Essential (primary) hypertension: Secondary | ICD-10-CM | POA: Diagnosis not present

## 2021-07-26 DIAGNOSIS — R351 Nocturia: Secondary | ICD-10-CM | POA: Diagnosis not present

## 2021-07-26 DIAGNOSIS — M6281 Muscle weakness (generalized): Secondary | ICD-10-CM | POA: Diagnosis not present

## 2021-07-26 DIAGNOSIS — M6282 Rhabdomyolysis: Secondary | ICD-10-CM | POA: Diagnosis not present

## 2021-07-26 DIAGNOSIS — R2681 Unsteadiness on feet: Secondary | ICD-10-CM | POA: Diagnosis not present

## 2021-07-26 DIAGNOSIS — R32 Unspecified urinary incontinence: Secondary | ICD-10-CM | POA: Diagnosis not present

## 2021-07-26 DIAGNOSIS — I70203 Unspecified atherosclerosis of native arteries of extremities, bilateral legs: Secondary | ICD-10-CM | POA: Diagnosis not present

## 2021-07-26 DIAGNOSIS — I69354 Hemiplegia and hemiparesis following cerebral infarction affecting left non-dominant side: Secondary | ICD-10-CM | POA: Diagnosis not present

## 2021-07-26 DIAGNOSIS — R1310 Dysphagia, unspecified: Secondary | ICD-10-CM | POA: Diagnosis not present

## 2021-07-26 DIAGNOSIS — R3914 Feeling of incomplete bladder emptying: Secondary | ICD-10-CM | POA: Diagnosis not present

## 2021-07-26 DIAGNOSIS — E785 Hyperlipidemia, unspecified: Secondary | ICD-10-CM | POA: Diagnosis not present

## 2021-07-26 DIAGNOSIS — N189 Chronic kidney disease, unspecified: Secondary | ICD-10-CM | POA: Diagnosis not present

## 2021-07-27 DIAGNOSIS — M6281 Muscle weakness (generalized): Secondary | ICD-10-CM | POA: Diagnosis not present

## 2021-07-27 DIAGNOSIS — R1310 Dysphagia, unspecified: Secondary | ICD-10-CM | POA: Diagnosis not present

## 2021-07-28 DIAGNOSIS — M6281 Muscle weakness (generalized): Secondary | ICD-10-CM | POA: Diagnosis not present

## 2021-07-28 DIAGNOSIS — R1310 Dysphagia, unspecified: Secondary | ICD-10-CM | POA: Diagnosis not present

## 2021-07-29 DIAGNOSIS — R1310 Dysphagia, unspecified: Secondary | ICD-10-CM | POA: Diagnosis not present

## 2021-07-29 DIAGNOSIS — M6281 Muscle weakness (generalized): Secondary | ICD-10-CM | POA: Diagnosis not present

## 2021-07-30 DIAGNOSIS — R1310 Dysphagia, unspecified: Secondary | ICD-10-CM | POA: Diagnosis not present

## 2021-07-30 DIAGNOSIS — M6281 Muscle weakness (generalized): Secondary | ICD-10-CM | POA: Diagnosis not present

## 2021-07-31 DIAGNOSIS — R2681 Unsteadiness on feet: Secondary | ICD-10-CM | POA: Diagnosis not present

## 2021-07-31 DIAGNOSIS — D508 Other iron deficiency anemias: Secondary | ICD-10-CM | POA: Diagnosis not present

## 2021-07-31 DIAGNOSIS — N400 Enlarged prostate without lower urinary tract symptoms: Secondary | ICD-10-CM | POA: Diagnosis not present

## 2021-07-31 DIAGNOSIS — M6281 Muscle weakness (generalized): Secondary | ICD-10-CM | POA: Diagnosis not present

## 2021-07-31 DIAGNOSIS — R1310 Dysphagia, unspecified: Secondary | ICD-10-CM | POA: Diagnosis not present

## 2021-07-31 DIAGNOSIS — I6990 Unspecified sequelae of unspecified cerebrovascular disease: Secondary | ICD-10-CM | POA: Diagnosis not present

## 2021-07-31 DIAGNOSIS — I10 Essential (primary) hypertension: Secondary | ICD-10-CM | POA: Diagnosis not present

## 2021-07-31 DIAGNOSIS — E785 Hyperlipidemia, unspecified: Secondary | ICD-10-CM | POA: Diagnosis not present

## 2021-08-01 DIAGNOSIS — M6281 Muscle weakness (generalized): Secondary | ICD-10-CM | POA: Diagnosis not present

## 2021-08-01 DIAGNOSIS — R1310 Dysphagia, unspecified: Secondary | ICD-10-CM | POA: Diagnosis not present

## 2021-08-02 DIAGNOSIS — R1310 Dysphagia, unspecified: Secondary | ICD-10-CM | POA: Diagnosis not present

## 2021-08-02 DIAGNOSIS — M6281 Muscle weakness (generalized): Secondary | ICD-10-CM | POA: Diagnosis not present

## 2021-08-03 DIAGNOSIS — R1310 Dysphagia, unspecified: Secondary | ICD-10-CM | POA: Diagnosis not present

## 2021-08-03 DIAGNOSIS — M6281 Muscle weakness (generalized): Secondary | ICD-10-CM | POA: Diagnosis not present

## 2021-08-04 DIAGNOSIS — R1310 Dysphagia, unspecified: Secondary | ICD-10-CM | POA: Diagnosis not present

## 2021-08-04 DIAGNOSIS — M6281 Muscle weakness (generalized): Secondary | ICD-10-CM | POA: Diagnosis not present

## 2021-08-05 DIAGNOSIS — R1310 Dysphagia, unspecified: Secondary | ICD-10-CM | POA: Diagnosis not present

## 2021-08-05 DIAGNOSIS — M6281 Muscle weakness (generalized): Secondary | ICD-10-CM | POA: Diagnosis not present

## 2021-08-06 DIAGNOSIS — M6281 Muscle weakness (generalized): Secondary | ICD-10-CM | POA: Diagnosis not present

## 2021-08-06 DIAGNOSIS — R1310 Dysphagia, unspecified: Secondary | ICD-10-CM | POA: Diagnosis not present

## 2021-08-07 DIAGNOSIS — R1310 Dysphagia, unspecified: Secondary | ICD-10-CM | POA: Diagnosis not present

## 2021-08-07 DIAGNOSIS — M6281 Muscle weakness (generalized): Secondary | ICD-10-CM | POA: Diagnosis not present

## 2021-08-08 DIAGNOSIS — R1310 Dysphagia, unspecified: Secondary | ICD-10-CM | POA: Diagnosis not present

## 2021-08-08 DIAGNOSIS — M6281 Muscle weakness (generalized): Secondary | ICD-10-CM | POA: Diagnosis not present

## 2021-08-09 DIAGNOSIS — R1310 Dysphagia, unspecified: Secondary | ICD-10-CM | POA: Diagnosis not present

## 2021-08-09 DIAGNOSIS — M6281 Muscle weakness (generalized): Secondary | ICD-10-CM | POA: Diagnosis not present

## 2021-08-10 DIAGNOSIS — R1310 Dysphagia, unspecified: Secondary | ICD-10-CM | POA: Diagnosis not present

## 2021-08-10 DIAGNOSIS — M6281 Muscle weakness (generalized): Secondary | ICD-10-CM | POA: Diagnosis not present

## 2021-08-11 DIAGNOSIS — E119 Type 2 diabetes mellitus without complications: Secondary | ICD-10-CM | POA: Diagnosis not present

## 2021-08-11 DIAGNOSIS — D518 Other vitamin B12 deficiency anemias: Secondary | ICD-10-CM | POA: Diagnosis not present

## 2021-08-11 DIAGNOSIS — M6281 Muscle weakness (generalized): Secondary | ICD-10-CM | POA: Diagnosis not present

## 2021-08-11 DIAGNOSIS — E785 Hyperlipidemia, unspecified: Secondary | ICD-10-CM | POA: Diagnosis not present

## 2021-08-11 DIAGNOSIS — E038 Other specified hypothyroidism: Secondary | ICD-10-CM | POA: Diagnosis not present

## 2021-08-11 DIAGNOSIS — N400 Enlarged prostate without lower urinary tract symptoms: Secondary | ICD-10-CM | POA: Diagnosis not present

## 2021-08-11 DIAGNOSIS — R1310 Dysphagia, unspecified: Secondary | ICD-10-CM | POA: Diagnosis not present

## 2021-08-11 DIAGNOSIS — I10 Essential (primary) hypertension: Secondary | ICD-10-CM | POA: Diagnosis not present

## 2021-08-11 DIAGNOSIS — E559 Vitamin D deficiency, unspecified: Secondary | ICD-10-CM | POA: Diagnosis not present

## 2021-08-11 DIAGNOSIS — D508 Other iron deficiency anemias: Secondary | ICD-10-CM | POA: Diagnosis not present

## 2021-08-11 DIAGNOSIS — I639 Cerebral infarction, unspecified: Secondary | ICD-10-CM | POA: Diagnosis not present

## 2021-08-12 DIAGNOSIS — M6281 Muscle weakness (generalized): Secondary | ICD-10-CM | POA: Diagnosis not present

## 2021-08-12 DIAGNOSIS — R1310 Dysphagia, unspecified: Secondary | ICD-10-CM | POA: Diagnosis not present

## 2021-08-13 DIAGNOSIS — M6281 Muscle weakness (generalized): Secondary | ICD-10-CM | POA: Diagnosis not present

## 2021-08-13 DIAGNOSIS — R1310 Dysphagia, unspecified: Secondary | ICD-10-CM | POA: Diagnosis not present

## 2021-08-14 DIAGNOSIS — M6281 Muscle weakness (generalized): Secondary | ICD-10-CM | POA: Diagnosis not present

## 2021-08-14 DIAGNOSIS — R1310 Dysphagia, unspecified: Secondary | ICD-10-CM | POA: Diagnosis not present

## 2021-08-15 DIAGNOSIS — R1310 Dysphagia, unspecified: Secondary | ICD-10-CM | POA: Diagnosis not present

## 2021-08-15 DIAGNOSIS — M6281 Muscle weakness (generalized): Secondary | ICD-10-CM | POA: Diagnosis not present

## 2021-08-16 DIAGNOSIS — M6281 Muscle weakness (generalized): Secondary | ICD-10-CM | POA: Diagnosis not present

## 2021-08-16 DIAGNOSIS — R1310 Dysphagia, unspecified: Secondary | ICD-10-CM | POA: Diagnosis not present

## 2021-08-17 DIAGNOSIS — R1310 Dysphagia, unspecified: Secondary | ICD-10-CM | POA: Diagnosis not present

## 2021-08-17 DIAGNOSIS — M6281 Muscle weakness (generalized): Secondary | ICD-10-CM | POA: Diagnosis not present

## 2021-08-18 DIAGNOSIS — R1310 Dysphagia, unspecified: Secondary | ICD-10-CM | POA: Diagnosis not present

## 2021-08-18 DIAGNOSIS — M6281 Muscle weakness (generalized): Secondary | ICD-10-CM | POA: Diagnosis not present

## 2021-08-19 DIAGNOSIS — M6281 Muscle weakness (generalized): Secondary | ICD-10-CM | POA: Diagnosis not present

## 2021-08-19 DIAGNOSIS — R1310 Dysphagia, unspecified: Secondary | ICD-10-CM | POA: Diagnosis not present

## 2021-08-20 DIAGNOSIS — R1310 Dysphagia, unspecified: Secondary | ICD-10-CM | POA: Diagnosis not present

## 2021-08-20 DIAGNOSIS — M6281 Muscle weakness (generalized): Secondary | ICD-10-CM | POA: Diagnosis not present

## 2021-08-21 DIAGNOSIS — R1310 Dysphagia, unspecified: Secondary | ICD-10-CM | POA: Diagnosis not present

## 2021-08-21 DIAGNOSIS — M6281 Muscle weakness (generalized): Secondary | ICD-10-CM | POA: Diagnosis not present

## 2021-08-22 DIAGNOSIS — M6281 Muscle weakness (generalized): Secondary | ICD-10-CM | POA: Diagnosis not present

## 2021-08-22 DIAGNOSIS — R32 Unspecified urinary incontinence: Secondary | ICD-10-CM | POA: Diagnosis not present

## 2021-08-22 DIAGNOSIS — I10 Essential (primary) hypertension: Secondary | ICD-10-CM | POA: Diagnosis not present

## 2021-08-22 DIAGNOSIS — R1310 Dysphagia, unspecified: Secondary | ICD-10-CM | POA: Diagnosis not present

## 2021-08-22 DIAGNOSIS — R159 Full incontinence of feces: Secondary | ICD-10-CM | POA: Diagnosis not present

## 2021-08-23 DIAGNOSIS — M6281 Muscle weakness (generalized): Secondary | ICD-10-CM | POA: Diagnosis not present

## 2021-08-23 DIAGNOSIS — R1310 Dysphagia, unspecified: Secondary | ICD-10-CM | POA: Diagnosis not present

## 2021-08-24 DIAGNOSIS — R1310 Dysphagia, unspecified: Secondary | ICD-10-CM | POA: Diagnosis not present

## 2021-08-24 DIAGNOSIS — M6281 Muscle weakness (generalized): Secondary | ICD-10-CM | POA: Diagnosis not present

## 2021-08-25 DIAGNOSIS — M6281 Muscle weakness (generalized): Secondary | ICD-10-CM | POA: Diagnosis not present

## 2021-08-25 DIAGNOSIS — R1310 Dysphagia, unspecified: Secondary | ICD-10-CM | POA: Diagnosis not present

## 2021-08-26 DIAGNOSIS — R1310 Dysphagia, unspecified: Secondary | ICD-10-CM | POA: Diagnosis not present

## 2021-08-26 DIAGNOSIS — M6281 Muscle weakness (generalized): Secondary | ICD-10-CM | POA: Diagnosis not present

## 2021-08-27 DIAGNOSIS — M6281 Muscle weakness (generalized): Secondary | ICD-10-CM | POA: Diagnosis not present

## 2021-08-27 DIAGNOSIS — R1310 Dysphagia, unspecified: Secondary | ICD-10-CM | POA: Diagnosis not present

## 2021-08-28 DIAGNOSIS — R1310 Dysphagia, unspecified: Secondary | ICD-10-CM | POA: Diagnosis not present

## 2021-08-28 DIAGNOSIS — D508 Other iron deficiency anemias: Secondary | ICD-10-CM | POA: Diagnosis not present

## 2021-08-28 DIAGNOSIS — M6281 Muscle weakness (generalized): Secondary | ICD-10-CM | POA: Diagnosis not present

## 2021-08-28 DIAGNOSIS — N400 Enlarged prostate without lower urinary tract symptoms: Secondary | ICD-10-CM | POA: Diagnosis not present

## 2021-08-28 DIAGNOSIS — I10 Essential (primary) hypertension: Secondary | ICD-10-CM | POA: Diagnosis not present

## 2021-08-28 DIAGNOSIS — I639 Cerebral infarction, unspecified: Secondary | ICD-10-CM | POA: Diagnosis not present

## 2021-08-29 DIAGNOSIS — R1310 Dysphagia, unspecified: Secondary | ICD-10-CM | POA: Diagnosis not present

## 2021-08-29 DIAGNOSIS — M6281 Muscle weakness (generalized): Secondary | ICD-10-CM | POA: Diagnosis not present

## 2021-08-30 DIAGNOSIS — R1310 Dysphagia, unspecified: Secondary | ICD-10-CM | POA: Diagnosis not present

## 2021-08-30 DIAGNOSIS — M6281 Muscle weakness (generalized): Secondary | ICD-10-CM | POA: Diagnosis not present

## 2021-08-31 DIAGNOSIS — E119 Type 2 diabetes mellitus without complications: Secondary | ICD-10-CM | POA: Diagnosis not present

## 2021-08-31 DIAGNOSIS — D518 Other vitamin B12 deficiency anemias: Secondary | ICD-10-CM | POA: Diagnosis not present

## 2021-08-31 DIAGNOSIS — M6281 Muscle weakness (generalized): Secondary | ICD-10-CM | POA: Diagnosis not present

## 2021-08-31 DIAGNOSIS — E7849 Other hyperlipidemia: Secondary | ICD-10-CM | POA: Diagnosis not present

## 2021-08-31 DIAGNOSIS — E559 Vitamin D deficiency, unspecified: Secondary | ICD-10-CM | POA: Diagnosis not present

## 2021-08-31 DIAGNOSIS — Z79899 Other long term (current) drug therapy: Secondary | ICD-10-CM | POA: Diagnosis not present

## 2021-08-31 DIAGNOSIS — R1310 Dysphagia, unspecified: Secondary | ICD-10-CM | POA: Diagnosis not present

## 2021-08-31 DIAGNOSIS — E038 Other specified hypothyroidism: Secondary | ICD-10-CM | POA: Diagnosis not present

## 2021-09-01 DIAGNOSIS — M6281 Muscle weakness (generalized): Secondary | ICD-10-CM | POA: Diagnosis not present

## 2021-09-01 DIAGNOSIS — R1310 Dysphagia, unspecified: Secondary | ICD-10-CM | POA: Diagnosis not present

## 2021-09-02 DIAGNOSIS — R1310 Dysphagia, unspecified: Secondary | ICD-10-CM | POA: Diagnosis not present

## 2021-09-02 DIAGNOSIS — M6281 Muscle weakness (generalized): Secondary | ICD-10-CM | POA: Diagnosis not present

## 2021-09-03 DIAGNOSIS — R1310 Dysphagia, unspecified: Secondary | ICD-10-CM | POA: Diagnosis not present

## 2021-09-03 DIAGNOSIS — M6281 Muscle weakness (generalized): Secondary | ICD-10-CM | POA: Diagnosis not present

## 2021-09-04 DIAGNOSIS — M6281 Muscle weakness (generalized): Secondary | ICD-10-CM | POA: Diagnosis not present

## 2021-09-04 DIAGNOSIS — R1310 Dysphagia, unspecified: Secondary | ICD-10-CM | POA: Diagnosis not present

## 2021-09-05 DIAGNOSIS — M6281 Muscle weakness (generalized): Secondary | ICD-10-CM | POA: Diagnosis not present

## 2021-09-05 DIAGNOSIS — R1310 Dysphagia, unspecified: Secondary | ICD-10-CM | POA: Diagnosis not present

## 2021-09-06 DIAGNOSIS — R1310 Dysphagia, unspecified: Secondary | ICD-10-CM | POA: Diagnosis not present

## 2021-09-06 DIAGNOSIS — M6281 Muscle weakness (generalized): Secondary | ICD-10-CM | POA: Diagnosis not present

## 2021-09-07 DIAGNOSIS — R1310 Dysphagia, unspecified: Secondary | ICD-10-CM | POA: Diagnosis not present

## 2021-09-07 DIAGNOSIS — M6281 Muscle weakness (generalized): Secondary | ICD-10-CM | POA: Diagnosis not present

## 2021-09-08 DIAGNOSIS — M6281 Muscle weakness (generalized): Secondary | ICD-10-CM | POA: Diagnosis not present

## 2021-09-08 DIAGNOSIS — R1310 Dysphagia, unspecified: Secondary | ICD-10-CM | POA: Diagnosis not present

## 2021-09-09 DIAGNOSIS — R1310 Dysphagia, unspecified: Secondary | ICD-10-CM | POA: Diagnosis not present

## 2021-09-09 DIAGNOSIS — M6281 Muscle weakness (generalized): Secondary | ICD-10-CM | POA: Diagnosis not present

## 2021-09-10 DIAGNOSIS — R1310 Dysphagia, unspecified: Secondary | ICD-10-CM | POA: Diagnosis not present

## 2021-09-10 DIAGNOSIS — M6281 Muscle weakness (generalized): Secondary | ICD-10-CM | POA: Diagnosis not present

## 2021-09-11 DIAGNOSIS — E785 Hyperlipidemia, unspecified: Secondary | ICD-10-CM | POA: Diagnosis not present

## 2021-09-11 DIAGNOSIS — E119 Type 2 diabetes mellitus without complications: Secondary | ICD-10-CM | POA: Diagnosis not present

## 2021-09-11 DIAGNOSIS — D518 Other vitamin B12 deficiency anemias: Secondary | ICD-10-CM | POA: Diagnosis not present

## 2021-09-11 DIAGNOSIS — I10 Essential (primary) hypertension: Secondary | ICD-10-CM | POA: Diagnosis not present

## 2021-09-11 DIAGNOSIS — D508 Other iron deficiency anemias: Secondary | ICD-10-CM | POA: Diagnosis not present

## 2021-09-11 DIAGNOSIS — E559 Vitamin D deficiency, unspecified: Secondary | ICD-10-CM | POA: Diagnosis not present

## 2021-09-11 DIAGNOSIS — R1310 Dysphagia, unspecified: Secondary | ICD-10-CM | POA: Diagnosis not present

## 2021-09-11 DIAGNOSIS — E038 Other specified hypothyroidism: Secondary | ICD-10-CM | POA: Diagnosis not present

## 2021-09-11 DIAGNOSIS — M6281 Muscle weakness (generalized): Secondary | ICD-10-CM | POA: Diagnosis not present

## 2021-09-11 DIAGNOSIS — I639 Cerebral infarction, unspecified: Secondary | ICD-10-CM | POA: Diagnosis not present

## 2021-09-11 DIAGNOSIS — N400 Enlarged prostate without lower urinary tract symptoms: Secondary | ICD-10-CM | POA: Diagnosis not present

## 2021-09-12 DIAGNOSIS — M6281 Muscle weakness (generalized): Secondary | ICD-10-CM | POA: Diagnosis not present

## 2021-09-12 DIAGNOSIS — R1310 Dysphagia, unspecified: Secondary | ICD-10-CM | POA: Diagnosis not present

## 2021-09-13 DIAGNOSIS — M6281 Muscle weakness (generalized): Secondary | ICD-10-CM | POA: Diagnosis not present

## 2021-09-13 DIAGNOSIS — R1310 Dysphagia, unspecified: Secondary | ICD-10-CM | POA: Diagnosis not present

## 2021-09-14 DIAGNOSIS — M6281 Muscle weakness (generalized): Secondary | ICD-10-CM | POA: Diagnosis not present

## 2021-09-14 DIAGNOSIS — R1310 Dysphagia, unspecified: Secondary | ICD-10-CM | POA: Diagnosis not present

## 2021-09-15 DIAGNOSIS — M6281 Muscle weakness (generalized): Secondary | ICD-10-CM | POA: Diagnosis not present

## 2021-09-15 DIAGNOSIS — R1310 Dysphagia, unspecified: Secondary | ICD-10-CM | POA: Diagnosis not present

## 2021-09-16 DIAGNOSIS — R1310 Dysphagia, unspecified: Secondary | ICD-10-CM | POA: Diagnosis not present

## 2021-09-16 DIAGNOSIS — M6281 Muscle weakness (generalized): Secondary | ICD-10-CM | POA: Diagnosis not present

## 2021-09-17 DIAGNOSIS — R1310 Dysphagia, unspecified: Secondary | ICD-10-CM | POA: Diagnosis not present

## 2021-09-17 DIAGNOSIS — M6281 Muscle weakness (generalized): Secondary | ICD-10-CM | POA: Diagnosis not present

## 2021-09-18 DIAGNOSIS — R1310 Dysphagia, unspecified: Secondary | ICD-10-CM | POA: Diagnosis not present

## 2021-09-18 DIAGNOSIS — M6281 Muscle weakness (generalized): Secondary | ICD-10-CM | POA: Diagnosis not present

## 2021-09-19 DIAGNOSIS — M6281 Muscle weakness (generalized): Secondary | ICD-10-CM | POA: Diagnosis not present

## 2021-09-19 DIAGNOSIS — R1310 Dysphagia, unspecified: Secondary | ICD-10-CM | POA: Diagnosis not present

## 2021-09-20 DIAGNOSIS — M6281 Muscle weakness (generalized): Secondary | ICD-10-CM | POA: Diagnosis not present

## 2021-09-20 DIAGNOSIS — R1310 Dysphagia, unspecified: Secondary | ICD-10-CM | POA: Diagnosis not present

## 2021-09-21 DIAGNOSIS — R1310 Dysphagia, unspecified: Secondary | ICD-10-CM | POA: Diagnosis not present

## 2021-09-21 DIAGNOSIS — M6281 Muscle weakness (generalized): Secondary | ICD-10-CM | POA: Diagnosis not present

## 2021-09-22 DIAGNOSIS — M6281 Muscle weakness (generalized): Secondary | ICD-10-CM | POA: Diagnosis not present

## 2021-09-22 DIAGNOSIS — R1310 Dysphagia, unspecified: Secondary | ICD-10-CM | POA: Diagnosis not present

## 2021-09-23 DIAGNOSIS — R1310 Dysphagia, unspecified: Secondary | ICD-10-CM | POA: Diagnosis not present

## 2021-09-23 DIAGNOSIS — M6281 Muscle weakness (generalized): Secondary | ICD-10-CM | POA: Diagnosis not present

## 2021-09-24 DIAGNOSIS — M6281 Muscle weakness (generalized): Secondary | ICD-10-CM | POA: Diagnosis not present

## 2021-09-24 DIAGNOSIS — R1310 Dysphagia, unspecified: Secondary | ICD-10-CM | POA: Diagnosis not present

## 2021-09-25 DIAGNOSIS — R32 Unspecified urinary incontinence: Secondary | ICD-10-CM | POA: Diagnosis not present

## 2021-09-25 DIAGNOSIS — I10 Essential (primary) hypertension: Secondary | ICD-10-CM | POA: Diagnosis not present

## 2021-09-25 DIAGNOSIS — I639 Cerebral infarction, unspecified: Secondary | ICD-10-CM | POA: Diagnosis not present

## 2021-09-25 DIAGNOSIS — N400 Enlarged prostate without lower urinary tract symptoms: Secondary | ICD-10-CM | POA: Diagnosis not present

## 2021-09-25 DIAGNOSIS — D508 Other iron deficiency anemias: Secondary | ICD-10-CM | POA: Diagnosis not present

## 2021-09-25 DIAGNOSIS — R1310 Dysphagia, unspecified: Secondary | ICD-10-CM | POA: Diagnosis not present

## 2021-09-25 DIAGNOSIS — R159 Full incontinence of feces: Secondary | ICD-10-CM | POA: Diagnosis not present

## 2021-09-25 DIAGNOSIS — M6281 Muscle weakness (generalized): Secondary | ICD-10-CM | POA: Diagnosis not present

## 2021-09-26 DIAGNOSIS — M6281 Muscle weakness (generalized): Secondary | ICD-10-CM | POA: Diagnosis not present

## 2021-09-26 DIAGNOSIS — R1310 Dysphagia, unspecified: Secondary | ICD-10-CM | POA: Diagnosis not present

## 2021-09-27 DIAGNOSIS — R1310 Dysphagia, unspecified: Secondary | ICD-10-CM | POA: Diagnosis not present

## 2021-09-27 DIAGNOSIS — M6281 Muscle weakness (generalized): Secondary | ICD-10-CM | POA: Diagnosis not present

## 2021-09-28 DIAGNOSIS — M6281 Muscle weakness (generalized): Secondary | ICD-10-CM | POA: Diagnosis not present

## 2021-09-28 DIAGNOSIS — R1310 Dysphagia, unspecified: Secondary | ICD-10-CM | POA: Diagnosis not present

## 2021-09-29 DIAGNOSIS — M6281 Muscle weakness (generalized): Secondary | ICD-10-CM | POA: Diagnosis not present

## 2021-09-29 DIAGNOSIS — R1310 Dysphagia, unspecified: Secondary | ICD-10-CM | POA: Diagnosis not present

## 2021-09-30 DIAGNOSIS — M6281 Muscle weakness (generalized): Secondary | ICD-10-CM | POA: Diagnosis not present

## 2021-09-30 DIAGNOSIS — R1310 Dysphagia, unspecified: Secondary | ICD-10-CM | POA: Diagnosis not present

## 2021-10-01 DIAGNOSIS — R1310 Dysphagia, unspecified: Secondary | ICD-10-CM | POA: Diagnosis not present

## 2021-10-01 DIAGNOSIS — M6281 Muscle weakness (generalized): Secondary | ICD-10-CM | POA: Diagnosis not present

## 2021-10-02 DIAGNOSIS — R1310 Dysphagia, unspecified: Secondary | ICD-10-CM | POA: Diagnosis not present

## 2021-10-02 DIAGNOSIS — M6281 Muscle weakness (generalized): Secondary | ICD-10-CM | POA: Diagnosis not present

## 2021-10-03 DIAGNOSIS — R1310 Dysphagia, unspecified: Secondary | ICD-10-CM | POA: Diagnosis not present

## 2021-10-03 DIAGNOSIS — M6281 Muscle weakness (generalized): Secondary | ICD-10-CM | POA: Diagnosis not present

## 2021-10-04 DIAGNOSIS — M6281 Muscle weakness (generalized): Secondary | ICD-10-CM | POA: Diagnosis not present

## 2021-10-04 DIAGNOSIS — R1310 Dysphagia, unspecified: Secondary | ICD-10-CM | POA: Diagnosis not present

## 2021-10-05 DIAGNOSIS — M6281 Muscle weakness (generalized): Secondary | ICD-10-CM | POA: Diagnosis not present

## 2021-10-05 DIAGNOSIS — R1310 Dysphagia, unspecified: Secondary | ICD-10-CM | POA: Diagnosis not present

## 2021-10-06 DIAGNOSIS — M6281 Muscle weakness (generalized): Secondary | ICD-10-CM | POA: Diagnosis not present

## 2021-10-06 DIAGNOSIS — R1310 Dysphagia, unspecified: Secondary | ICD-10-CM | POA: Diagnosis not present

## 2021-10-07 DIAGNOSIS — M6281 Muscle weakness (generalized): Secondary | ICD-10-CM | POA: Diagnosis not present

## 2021-10-07 DIAGNOSIS — R1310 Dysphagia, unspecified: Secondary | ICD-10-CM | POA: Diagnosis not present

## 2021-10-08 DIAGNOSIS — M6281 Muscle weakness (generalized): Secondary | ICD-10-CM | POA: Diagnosis not present

## 2021-10-08 DIAGNOSIS — R1310 Dysphagia, unspecified: Secondary | ICD-10-CM | POA: Diagnosis not present

## 2021-10-09 DIAGNOSIS — M6281 Muscle weakness (generalized): Secondary | ICD-10-CM | POA: Diagnosis not present

## 2021-10-09 DIAGNOSIS — R1310 Dysphagia, unspecified: Secondary | ICD-10-CM | POA: Diagnosis not present

## 2021-10-10 DIAGNOSIS — D518 Other vitamin B12 deficiency anemias: Secondary | ICD-10-CM | POA: Diagnosis not present

## 2021-10-10 DIAGNOSIS — E038 Other specified hypothyroidism: Secondary | ICD-10-CM | POA: Diagnosis not present

## 2021-10-10 DIAGNOSIS — M6281 Muscle weakness (generalized): Secondary | ICD-10-CM | POA: Diagnosis not present

## 2021-10-10 DIAGNOSIS — I10 Essential (primary) hypertension: Secondary | ICD-10-CM | POA: Diagnosis not present

## 2021-10-10 DIAGNOSIS — D508 Other iron deficiency anemias: Secondary | ICD-10-CM | POA: Diagnosis not present

## 2021-10-10 DIAGNOSIS — E785 Hyperlipidemia, unspecified: Secondary | ICD-10-CM | POA: Diagnosis not present

## 2021-10-10 DIAGNOSIS — R1310 Dysphagia, unspecified: Secondary | ICD-10-CM | POA: Diagnosis not present

## 2021-10-10 DIAGNOSIS — I639 Cerebral infarction, unspecified: Secondary | ICD-10-CM | POA: Diagnosis not present

## 2021-10-10 DIAGNOSIS — N400 Enlarged prostate without lower urinary tract symptoms: Secondary | ICD-10-CM | POA: Diagnosis not present

## 2021-10-10 DIAGNOSIS — E119 Type 2 diabetes mellitus without complications: Secondary | ICD-10-CM | POA: Diagnosis not present

## 2021-10-10 DIAGNOSIS — E559 Vitamin D deficiency, unspecified: Secondary | ICD-10-CM | POA: Diagnosis not present

## 2021-10-11 DIAGNOSIS — I7091 Generalized atherosclerosis: Secondary | ICD-10-CM | POA: Diagnosis not present

## 2021-10-11 DIAGNOSIS — R1310 Dysphagia, unspecified: Secondary | ICD-10-CM | POA: Diagnosis not present

## 2021-10-11 DIAGNOSIS — L603 Nail dystrophy: Secondary | ICD-10-CM | POA: Diagnosis not present

## 2021-10-11 DIAGNOSIS — R600 Localized edema: Secondary | ICD-10-CM | POA: Diagnosis not present

## 2021-10-11 DIAGNOSIS — M6281 Muscle weakness (generalized): Secondary | ICD-10-CM | POA: Diagnosis not present

## 2021-10-12 DIAGNOSIS — R1310 Dysphagia, unspecified: Secondary | ICD-10-CM | POA: Diagnosis not present

## 2021-10-12 DIAGNOSIS — M6281 Muscle weakness (generalized): Secondary | ICD-10-CM | POA: Diagnosis not present

## 2021-10-13 DIAGNOSIS — R1310 Dysphagia, unspecified: Secondary | ICD-10-CM | POA: Diagnosis not present

## 2021-10-13 DIAGNOSIS — M6281 Muscle weakness (generalized): Secondary | ICD-10-CM | POA: Diagnosis not present

## 2021-10-14 DIAGNOSIS — M6281 Muscle weakness (generalized): Secondary | ICD-10-CM | POA: Diagnosis not present

## 2021-10-14 DIAGNOSIS — R1310 Dysphagia, unspecified: Secondary | ICD-10-CM | POA: Diagnosis not present

## 2021-10-15 DIAGNOSIS — R1310 Dysphagia, unspecified: Secondary | ICD-10-CM | POA: Diagnosis not present

## 2021-10-15 DIAGNOSIS — M6281 Muscle weakness (generalized): Secondary | ICD-10-CM | POA: Diagnosis not present

## 2021-10-16 DIAGNOSIS — M6281 Muscle weakness (generalized): Secondary | ICD-10-CM | POA: Diagnosis not present

## 2021-10-16 DIAGNOSIS — R1310 Dysphagia, unspecified: Secondary | ICD-10-CM | POA: Diagnosis not present

## 2021-10-17 DIAGNOSIS — M6281 Muscle weakness (generalized): Secondary | ICD-10-CM | POA: Diagnosis not present

## 2021-10-17 DIAGNOSIS — R1310 Dysphagia, unspecified: Secondary | ICD-10-CM | POA: Diagnosis not present

## 2021-10-18 ENCOUNTER — Encounter: Payer: Self-pay | Admitting: Physician Assistant

## 2021-10-18 ENCOUNTER — Ambulatory Visit (INDEPENDENT_AMBULATORY_CARE_PROVIDER_SITE_OTHER): Payer: Medicare HMO | Admitting: Physician Assistant

## 2021-10-18 ENCOUNTER — Other Ambulatory Visit: Payer: Self-pay

## 2021-10-18 VITALS — BP 150/64 | HR 69 | Ht 69.0 in | Wt 150.0 lb

## 2021-10-18 DIAGNOSIS — R8271 Bacteriuria: Secondary | ICD-10-CM

## 2021-10-18 DIAGNOSIS — N401 Enlarged prostate with lower urinary tract symptoms: Secondary | ICD-10-CM | POA: Diagnosis not present

## 2021-10-18 DIAGNOSIS — R3914 Feeling of incomplete bladder emptying: Secondary | ICD-10-CM

## 2021-10-18 DIAGNOSIS — N200 Calculus of kidney: Secondary | ICD-10-CM | POA: Diagnosis not present

## 2021-10-18 DIAGNOSIS — M6281 Muscle weakness (generalized): Secondary | ICD-10-CM | POA: Diagnosis not present

## 2021-10-18 DIAGNOSIS — R1310 Dysphagia, unspecified: Secondary | ICD-10-CM | POA: Diagnosis not present

## 2021-10-18 LAB — BLADDER SCAN AMB NON-IMAGING

## 2021-10-18 NOTE — Progress Notes (Signed)
10/18/2021 1:37 PM   Jon Smith Smith Mar 17, 1928 [id number]  CC: Chief Complaint  Patient presents with   Follow-up   Benign Prostatic Hypertrophy    HPI: Jon Smith Smith is a 87 y.o. male with PMH Smith, Jon Smith Smith, Jon Smith Smith, Jon Smith Smith, Jon Smith Smith, Jon Smith Smith, Jon Smith Smith who presents today for 58-month follow-up with PVR.  He is unaccompanied in clinic today.  Today he reports no urinary concerns.  He denies flank pain, dysuria, Jon Smith Smith.  PVR 172 mL today, previously 230 mL.  PMH: Past Medical History:  Diagnosis Date   Back pain    Jon Smith Smith    Hepatitis    HLD (hyperlipidemia)    Hypertension    Incomplete bladder emptying    Lower extremity ulceration (HCC)    Nocturia    Peripheral arterial disease (HCC)    Prostate cancer ([place]) remote   Smith by Dr. Ernst Spell.  Smith 5.54 in September at [facility]   Renal stones     Surgical History: Past Surgical History:  Procedure Laterality Date   AORTOGRAM     PERITONEAL CATHETER INSERTION     PROSTATE SURGERY     TRANSLUMINAL ANGIOPLASTY      Home Medications:  Allergies as of 10/18/2021       Reactions   Aspirin Nausea Jon Smith Vomiting   Sulfamethoxazole-trimethoprim    Other reaction(s): Blood Disorder Coagulopathy        Medication List        Accurate as of October 18, 2021  1:37 PM. If you have any questions, ask your nurse or doctor.          Aspirin Low Dose 81 MG EC tablet Generic drug: aspirin Take 81 mg by mouth daily.   atorvastatin 40 MG tablet Commonly known as: LIPITOR Take 40 mg by mouth daily.   clopidogrel 75 MG tablet Commonly known as: PLAVIX Take by mouth.   ferrous sulfate 325 (65 FE) MG EC tablet Take 1 tablet by mouth daily.   Smith 5 MG tablet Commonly known as: PROSCAR Take 1 tablet (5 mg total) by mouth daily.   latanoprost 0.005 % ophthalmic  solution Commonly known as: XALATAN   lisinopril 20 MG tablet Commonly known as: ZESTRIL   metoprolol tartrate 25 MG tablet Commonly known as: LOPRESSOR Take 25 mg by mouth 2 (two) times daily.   tamsulosin 0.4 MG Caps capsule Commonly known as: FLOMAX Take 1 capsule (0.4 mg total) by mouth daily. At bedtime        Allergies:  Allergies  Allergen Reactions   Aspirin Nausea Jon Smith Vomiting   Sulfamethoxazole-Trimethoprim     Other reaction(s): Blood Disorder Coagulopathy    Family History: Family History  Problem Relation Age of Onset   Heart disease Brother    Kidney Stones Sister    Heart attack Sister    Heart attack Brother    Kidney disease Neg Hx    Prostate cancer Neg Hx     Social History:   reports that he has quit smoking. His smoking use included cigarettes. He has never used smokeless tobacco. He reports that he does not drink alcohol Jon Smith does not use drugs.  Physical Exam: BP (!) 150/64   Pulse 69   Ht 5\' 9"  (1.753 m)   Wt 150 lb (68 kg)   BMI 22.15 kg/m   Constitutional:  Alert Jon Smith oriented, no acute distress, nontoxic appearing HEENT: [place], AT  Cardiovascular: No clubbing, cyanosis, or edema Respiratory: Normal respiratory effort, no increased work of breathing Skin: No rashes, bruises or suspicious lesions Neurologic: Grossly intact, no focal deficits, moving all 4 extremities Psychiatric: Normal mood Jon Smith affect  Laboratory Data: Results for orders placed or performed in visit on 10/18/21  Bladder Scan (Post Void Residual) in office  Result Value Ref Range   Scan Result 144mL    Assessment & Plan:   1. Benign prostatic hyperplasia with incomplete bladder emptying Slightly improved PVR today.  Okay to continue Flomax Jon Smith Smith.  We will plan to recheck his PVR Jon Smith obtain annual Smith at his next visit in 6 months. - Bladder Scan (Post Void Residual) in office  2. Kidney stones Jon Smith, continue conservative management.  3. Chronic  Smith Jon Smith, continue conservative management.  Return in about 6 months (around 04/18/2022) for Symptom recheck with Smith Jon Smith PVR with Dr. Erlene Quan.  Debroah Loop, PA-C  [facility] [address], [place], [place] [address] [phone]

## 2021-10-19 DIAGNOSIS — R1310 Dysphagia, unspecified: Secondary | ICD-10-CM | POA: Diagnosis not present

## 2021-10-19 DIAGNOSIS — M6281 Muscle weakness (generalized): Secondary | ICD-10-CM | POA: Diagnosis not present

## 2021-10-20 DIAGNOSIS — R1310 Dysphagia, unspecified: Secondary | ICD-10-CM | POA: Diagnosis not present

## 2021-10-20 DIAGNOSIS — M6281 Muscle weakness (generalized): Secondary | ICD-10-CM | POA: Diagnosis not present

## 2021-10-21 DIAGNOSIS — R1310 Dysphagia, unspecified: Secondary | ICD-10-CM | POA: Diagnosis not present

## 2021-10-21 DIAGNOSIS — M6281 Muscle weakness (generalized): Secondary | ICD-10-CM | POA: Diagnosis not present

## 2021-10-22 DIAGNOSIS — R1310 Dysphagia, unspecified: Secondary | ICD-10-CM | POA: Diagnosis not present

## 2021-10-22 DIAGNOSIS — M6281 Muscle weakness (generalized): Secondary | ICD-10-CM | POA: Diagnosis not present

## 2021-10-23 DIAGNOSIS — R159 Full incontinence of feces: Secondary | ICD-10-CM | POA: Diagnosis not present

## 2021-10-23 DIAGNOSIS — R1310 Dysphagia, unspecified: Secondary | ICD-10-CM | POA: Diagnosis not present

## 2021-10-23 DIAGNOSIS — M6281 Muscle weakness (generalized): Secondary | ICD-10-CM | POA: Diagnosis not present

## 2021-10-23 DIAGNOSIS — R32 Unspecified urinary incontinence: Secondary | ICD-10-CM | POA: Diagnosis not present

## 2021-10-24 DIAGNOSIS — R1310 Dysphagia, unspecified: Secondary | ICD-10-CM | POA: Diagnosis not present

## 2021-10-24 DIAGNOSIS — M6281 Muscle weakness (generalized): Secondary | ICD-10-CM | POA: Diagnosis not present

## 2021-10-25 DIAGNOSIS — R1310 Dysphagia, unspecified: Secondary | ICD-10-CM | POA: Diagnosis not present

## 2021-10-25 DIAGNOSIS — M6281 Muscle weakness (generalized): Secondary | ICD-10-CM | POA: Diagnosis not present

## 2021-10-25 DIAGNOSIS — I10 Essential (primary) hypertension: Secondary | ICD-10-CM | POA: Diagnosis not present

## 2021-10-26 DIAGNOSIS — M6281 Muscle weakness (generalized): Secondary | ICD-10-CM | POA: Diagnosis not present

## 2021-10-26 DIAGNOSIS — R1310 Dysphagia, unspecified: Secondary | ICD-10-CM | POA: Diagnosis not present

## 2021-10-27 DIAGNOSIS — M6281 Muscle weakness (generalized): Secondary | ICD-10-CM | POA: Diagnosis not present

## 2021-10-27 DIAGNOSIS — R1310 Dysphagia, unspecified: Secondary | ICD-10-CM | POA: Diagnosis not present

## 2021-10-28 DIAGNOSIS — R1310 Dysphagia, unspecified: Secondary | ICD-10-CM | POA: Diagnosis not present

## 2021-10-28 DIAGNOSIS — M6281 Muscle weakness (generalized): Secondary | ICD-10-CM | POA: Diagnosis not present

## 2021-10-29 DIAGNOSIS — R1310 Dysphagia, unspecified: Secondary | ICD-10-CM | POA: Diagnosis not present

## 2021-10-29 DIAGNOSIS — M6281 Muscle weakness (generalized): Secondary | ICD-10-CM | POA: Diagnosis not present

## 2021-10-30 DIAGNOSIS — M6281 Muscle weakness (generalized): Secondary | ICD-10-CM | POA: Diagnosis not present

## 2021-10-30 DIAGNOSIS — R1310 Dysphagia, unspecified: Secondary | ICD-10-CM | POA: Diagnosis not present

## 2021-10-31 DIAGNOSIS — H4323 Crystalline deposits in vitreous body, bilateral: Secondary | ICD-10-CM | POA: Diagnosis not present

## 2021-10-31 DIAGNOSIS — H2512 Age-related nuclear cataract, left eye: Secondary | ICD-10-CM | POA: Diagnosis not present

## 2021-10-31 DIAGNOSIS — Z961 Presence of intraocular lens: Secondary | ICD-10-CM | POA: Diagnosis not present

## 2021-10-31 DIAGNOSIS — H47391 Other disorders of optic disc, right eye: Secondary | ICD-10-CM | POA: Diagnosis not present

## 2021-10-31 DIAGNOSIS — M6281 Muscle weakness (generalized): Secondary | ICD-10-CM | POA: Diagnosis not present

## 2021-10-31 DIAGNOSIS — R1310 Dysphagia, unspecified: Secondary | ICD-10-CM | POA: Diagnosis not present

## 2021-11-01 DIAGNOSIS — M6281 Muscle weakness (generalized): Secondary | ICD-10-CM | POA: Diagnosis not present

## 2021-11-01 DIAGNOSIS — R1310 Dysphagia, unspecified: Secondary | ICD-10-CM | POA: Diagnosis not present

## 2021-11-02 DIAGNOSIS — R1310 Dysphagia, unspecified: Secondary | ICD-10-CM | POA: Diagnosis not present

## 2021-11-02 DIAGNOSIS — M6281 Muscle weakness (generalized): Secondary | ICD-10-CM | POA: Diagnosis not present

## 2021-11-03 DIAGNOSIS — M6281 Muscle weakness (generalized): Secondary | ICD-10-CM | POA: Diagnosis not present

## 2021-11-03 DIAGNOSIS — R1310 Dysphagia, unspecified: Secondary | ICD-10-CM | POA: Diagnosis not present

## 2021-11-04 DIAGNOSIS — M6281 Muscle weakness (generalized): Secondary | ICD-10-CM | POA: Diagnosis not present

## 2021-11-04 DIAGNOSIS — R1310 Dysphagia, unspecified: Secondary | ICD-10-CM | POA: Diagnosis not present

## 2021-11-05 DIAGNOSIS — M6281 Muscle weakness (generalized): Secondary | ICD-10-CM | POA: Diagnosis not present

## 2021-11-05 DIAGNOSIS — R1310 Dysphagia, unspecified: Secondary | ICD-10-CM | POA: Diagnosis not present

## 2021-11-06 DIAGNOSIS — M6281 Muscle weakness (generalized): Secondary | ICD-10-CM | POA: Diagnosis not present

## 2021-11-06 DIAGNOSIS — N400 Enlarged prostate without lower urinary tract symptoms: Secondary | ICD-10-CM | POA: Diagnosis not present

## 2021-11-06 DIAGNOSIS — D508 Other iron deficiency anemias: Secondary | ICD-10-CM | POA: Diagnosis not present

## 2021-11-06 DIAGNOSIS — R1310 Dysphagia, unspecified: Secondary | ICD-10-CM | POA: Diagnosis not present

## 2021-11-06 DIAGNOSIS — I639 Cerebral infarction, unspecified: Secondary | ICD-10-CM | POA: Diagnosis not present

## 2021-11-06 DIAGNOSIS — I10 Essential (primary) hypertension: Secondary | ICD-10-CM | POA: Diagnosis not present

## 2021-11-06 DIAGNOSIS — R739 Hyperglycemia, unspecified: Secondary | ICD-10-CM | POA: Diagnosis not present

## 2021-11-06 DIAGNOSIS — E785 Hyperlipidemia, unspecified: Secondary | ICD-10-CM | POA: Diagnosis not present

## 2021-11-07 DIAGNOSIS — M6281 Muscle weakness (generalized): Secondary | ICD-10-CM | POA: Diagnosis not present

## 2021-11-07 DIAGNOSIS — R1310 Dysphagia, unspecified: Secondary | ICD-10-CM | POA: Diagnosis not present

## 2021-11-08 DIAGNOSIS — I10 Essential (primary) hypertension: Secondary | ICD-10-CM | POA: Diagnosis not present

## 2021-11-08 DIAGNOSIS — E119 Type 2 diabetes mellitus without complications: Secondary | ICD-10-CM | POA: Diagnosis not present

## 2021-11-08 DIAGNOSIS — D508 Other iron deficiency anemias: Secondary | ICD-10-CM | POA: Diagnosis not present

## 2021-11-08 DIAGNOSIS — M6281 Muscle weakness (generalized): Secondary | ICD-10-CM | POA: Diagnosis not present

## 2021-11-08 DIAGNOSIS — E785 Hyperlipidemia, unspecified: Secondary | ICD-10-CM | POA: Diagnosis not present

## 2021-11-08 DIAGNOSIS — R1310 Dysphagia, unspecified: Secondary | ICD-10-CM | POA: Diagnosis not present

## 2021-11-08 DIAGNOSIS — I639 Cerebral infarction, unspecified: Secondary | ICD-10-CM | POA: Diagnosis not present

## 2021-11-08 DIAGNOSIS — N400 Enlarged prostate without lower urinary tract symptoms: Secondary | ICD-10-CM | POA: Diagnosis not present

## 2021-11-08 DIAGNOSIS — E559 Vitamin D deficiency, unspecified: Secondary | ICD-10-CM | POA: Diagnosis not present

## 2021-11-08 DIAGNOSIS — E038 Other specified hypothyroidism: Secondary | ICD-10-CM | POA: Diagnosis not present

## 2021-11-08 DIAGNOSIS — D518 Other vitamin B12 deficiency anemias: Secondary | ICD-10-CM | POA: Diagnosis not present

## 2021-11-09 DIAGNOSIS — M6281 Muscle weakness (generalized): Secondary | ICD-10-CM | POA: Diagnosis not present

## 2021-11-09 DIAGNOSIS — R1310 Dysphagia, unspecified: Secondary | ICD-10-CM | POA: Diagnosis not present

## 2021-11-10 DIAGNOSIS — M6281 Muscle weakness (generalized): Secondary | ICD-10-CM | POA: Diagnosis not present

## 2021-11-10 DIAGNOSIS — R1310 Dysphagia, unspecified: Secondary | ICD-10-CM | POA: Diagnosis not present

## 2021-11-11 DIAGNOSIS — M6281 Muscle weakness (generalized): Secondary | ICD-10-CM | POA: Diagnosis not present

## 2021-11-11 DIAGNOSIS — R1310 Dysphagia, unspecified: Secondary | ICD-10-CM | POA: Diagnosis not present

## 2021-11-12 DIAGNOSIS — R1310 Dysphagia, unspecified: Secondary | ICD-10-CM | POA: Diagnosis not present

## 2021-11-12 DIAGNOSIS — M6281 Muscle weakness (generalized): Secondary | ICD-10-CM | POA: Diagnosis not present

## 2021-11-13 DIAGNOSIS — M6281 Muscle weakness (generalized): Secondary | ICD-10-CM | POA: Diagnosis not present

## 2021-11-13 DIAGNOSIS — R1310 Dysphagia, unspecified: Secondary | ICD-10-CM | POA: Diagnosis not present

## 2021-11-14 DIAGNOSIS — R1310 Dysphagia, unspecified: Secondary | ICD-10-CM | POA: Diagnosis not present

## 2021-11-14 DIAGNOSIS — M6281 Muscle weakness (generalized): Secondary | ICD-10-CM | POA: Diagnosis not present

## 2021-11-15 DIAGNOSIS — R1310 Dysphagia, unspecified: Secondary | ICD-10-CM | POA: Diagnosis not present

## 2021-11-15 DIAGNOSIS — M6281 Muscle weakness (generalized): Secondary | ICD-10-CM | POA: Diagnosis not present

## 2021-11-16 DIAGNOSIS — M6281 Muscle weakness (generalized): Secondary | ICD-10-CM | POA: Diagnosis not present

## 2021-11-16 DIAGNOSIS — R1310 Dysphagia, unspecified: Secondary | ICD-10-CM | POA: Diagnosis not present

## 2021-11-17 DIAGNOSIS — R1310 Dysphagia, unspecified: Secondary | ICD-10-CM | POA: Diagnosis not present

## 2021-11-17 DIAGNOSIS — M6281 Muscle weakness (generalized): Secondary | ICD-10-CM | POA: Diagnosis not present

## 2021-11-18 DIAGNOSIS — M6281 Muscle weakness (generalized): Secondary | ICD-10-CM | POA: Diagnosis not present

## 2021-11-18 DIAGNOSIS — R1310 Dysphagia, unspecified: Secondary | ICD-10-CM | POA: Diagnosis not present

## 2021-11-19 DIAGNOSIS — M6281 Muscle weakness (generalized): Secondary | ICD-10-CM | POA: Diagnosis not present

## 2021-11-19 DIAGNOSIS — R1310 Dysphagia, unspecified: Secondary | ICD-10-CM | POA: Diagnosis not present

## 2021-11-19 DIAGNOSIS — R32 Unspecified urinary incontinence: Secondary | ICD-10-CM | POA: Diagnosis not present

## 2021-11-19 DIAGNOSIS — R159 Full incontinence of feces: Secondary | ICD-10-CM | POA: Diagnosis not present

## 2021-11-20 DIAGNOSIS — R1310 Dysphagia, unspecified: Secondary | ICD-10-CM | POA: Diagnosis not present

## 2021-11-20 DIAGNOSIS — M6281 Muscle weakness (generalized): Secondary | ICD-10-CM | POA: Diagnosis not present

## 2021-11-21 DIAGNOSIS — M6281 Muscle weakness (generalized): Secondary | ICD-10-CM | POA: Diagnosis not present

## 2021-11-21 DIAGNOSIS — R1310 Dysphagia, unspecified: Secondary | ICD-10-CM | POA: Diagnosis not present

## 2021-11-22 DIAGNOSIS — M6281 Muscle weakness (generalized): Secondary | ICD-10-CM | POA: Diagnosis not present

## 2021-11-22 DIAGNOSIS — R1310 Dysphagia, unspecified: Secondary | ICD-10-CM | POA: Diagnosis not present

## 2021-11-23 DIAGNOSIS — R1310 Dysphagia, unspecified: Secondary | ICD-10-CM | POA: Diagnosis not present

## 2021-11-23 DIAGNOSIS — M6281 Muscle weakness (generalized): Secondary | ICD-10-CM | POA: Diagnosis not present

## 2021-11-24 DIAGNOSIS — R1310 Dysphagia, unspecified: Secondary | ICD-10-CM | POA: Diagnosis not present

## 2021-11-24 DIAGNOSIS — M6281 Muscle weakness (generalized): Secondary | ICD-10-CM | POA: Diagnosis not present

## 2021-11-25 DIAGNOSIS — R1310 Dysphagia, unspecified: Secondary | ICD-10-CM | POA: Diagnosis not present

## 2021-11-25 DIAGNOSIS — M6281 Muscle weakness (generalized): Secondary | ICD-10-CM | POA: Diagnosis not present

## 2021-11-26 ENCOUNTER — Emergency Department
Admission: EM | Admit: 2021-11-26 | Discharge: 2021-11-26 | Disposition: A | Discharge status: Home / Self Care | Payer: Medicare HMO | Attending: Emergency Medicine | Admitting: Emergency Medicine

## 2021-11-26 ENCOUNTER — Emergency Department: Payer: Medicare HMO

## 2021-11-26 ENCOUNTER — Other Ambulatory Visit: Payer: Self-pay

## 2021-11-26 DIAGNOSIS — J189 Pneumonia, unspecified organism: Secondary | ICD-10-CM

## 2021-11-26 DIAGNOSIS — I10 Essential (primary) hypertension: Secondary | ICD-10-CM | POA: Diagnosis not present

## 2021-11-26 DIAGNOSIS — N3000 Acute cystitis without hematuria: Secondary | ICD-10-CM | POA: Insufficient documentation

## 2021-11-26 DIAGNOSIS — Z79899 Other long term (current) drug therapy: Secondary | ICD-10-CM | POA: Diagnosis not present

## 2021-11-26 DIAGNOSIS — Z743 Need for continuous supervision: Secondary | ICD-10-CM | POA: Diagnosis not present

## 2021-11-26 DIAGNOSIS — Z8546 Personal history of malignant neoplasm of prostate: Secondary | ICD-10-CM | POA: Diagnosis not present

## 2021-11-26 DIAGNOSIS — I129 Hypertensive chronic kidney disease with stage 1 through stage 4 chronic kidney disease, or unspecified chronic kidney disease: Secondary | ICD-10-CM | POA: Diagnosis not present

## 2021-11-26 DIAGNOSIS — R531 Weakness: Secondary | ICD-10-CM | POA: Diagnosis not present

## 2021-11-26 DIAGNOSIS — N189 Chronic kidney disease, unspecified: Secondary | ICD-10-CM | POA: Insufficient documentation

## 2021-11-26 DIAGNOSIS — J181 Lobar pneumonia, unspecified organism: Secondary | ICD-10-CM | POA: Insufficient documentation

## 2021-11-26 DIAGNOSIS — R1310 Dysphagia, unspecified: Secondary | ICD-10-CM | POA: Diagnosis not present

## 2021-11-26 DIAGNOSIS — M6281 Muscle weakness (generalized): Secondary | ICD-10-CM | POA: Diagnosis not present

## 2021-11-26 DIAGNOSIS — J984 Other disorders of lung: Secondary | ICD-10-CM | POA: Diagnosis not present

## 2021-11-26 DIAGNOSIS — Z7902 Long term (current) use of antithrombotics/antiplatelets: Secondary | ICD-10-CM | POA: Insufficient documentation

## 2021-11-26 DIAGNOSIS — Z7982 Long term (current) use of aspirin: Secondary | ICD-10-CM | POA: Diagnosis not present

## 2021-11-26 DIAGNOSIS — R4182 Altered mental status, unspecified: Secondary | ICD-10-CM | POA: Diagnosis not present

## 2021-11-26 DIAGNOSIS — Z87891 Personal history of nicotine dependence: Secondary | ICD-10-CM | POA: Diagnosis not present

## 2021-11-26 DIAGNOSIS — R918 Other nonspecific abnormal finding of lung field: Secondary | ICD-10-CM | POA: Diagnosis not present

## 2021-11-26 DIAGNOSIS — R410 Disorientation, unspecified: Secondary | ICD-10-CM | POA: Diagnosis not present

## 2021-11-26 LAB — BASIC METABOLIC PANEL
Anion gap: 10 (ref 5–15)
BUN: 29 mg/dL — ABNORMAL HIGH (ref 8–23)
CO2: 20 mmol/L — ABNORMAL LOW (ref 22–32)
Calcium: 8.9 mg/dL (ref 8.9–10.3)
Chloride: 104 mmol/L (ref 98–111)
Creatinine, Ser: 1.92 mg/dL — ABNORMAL HIGH (ref 0.61–1.24)
GFR, Estimated: 32 mL/min — ABNORMAL LOW (ref >60)
Glucose, Bld: 126 mg/dL — ABNORMAL HIGH (ref 70–99)
Potassium: 4 mmol/L (ref 3.5–5.1)
Sodium: 134 mmol/L — ABNORMAL LOW (ref 135–145)

## 2021-11-26 LAB — URINALYSIS, ROUTINE W REFLEX MICROSCOPIC
Bilirubin Urine: NEGATIVE
Glucose, UA: NEGATIVE mg/dL
Ketones, ur: NEGATIVE mg/dL
Nitrite: NEGATIVE
Protein, ur: 100 mg/dL — AB
Specific Gravity, Urine: 1.025 (ref 1.005–1.030)
WBC, UA: >50 WBC/hpf — ABNORMAL HIGH (ref 0–5)
pH: 5.5 (ref 5.0–8.0)

## 2021-11-26 LAB — CBC
HCT: 34.9 % — ABNORMAL LOW (ref 39.0–52.0)
Hemoglobin: 11.1 g/dL — ABNORMAL LOW (ref 13.0–17.0)
MCH: 29.1 pg (ref 26.0–34.0)
MCHC: 31.8 g/dL (ref 30.0–36.0)
MCV: 91.4 fL (ref 80.0–100.0)
Platelets: 323 10*3/uL (ref 150–400)
RBC: 3.82 MIL/uL — ABNORMAL LOW (ref 4.22–5.81)
RDW: 17.1 % — ABNORMAL HIGH (ref 11.5–15.5)
WBC: 10.1 10*3/uL (ref 4.0–10.5)
nRBC: 0 % (ref 0.0–0.2)

## 2021-11-26 MED ORDER — CEFDINIR 300 MG PO CAPS: 300.0000 mg | ORAL_CAPSULE | Freq: Two times a day (BID) | ORAL | 0 refills | Status: AC

## 2021-11-26 MED ORDER — CEFDINIR 300 MG PO CAPS
300.0000 mg | ORAL_CAPSULE | Freq: Once | ORAL | Status: AC
  Administered 2021-11-26: 19:00:00 300 mg via ORAL
  Filled 2021-11-26: qty 1

## 2021-11-26 NOTE — ED Provider Notes (Signed)
[facility] Emergency Department Provider Note ____________________________________________   Event Date/Time   First MD Initiated Contact with Patient 11/26/21 1550     (approximate)  I have reviewed the triage vital signs and the nursing notes.  HISTORY  Chief Complaint UTI   HPI Jon Smith is a 87 y.o. malewho presents to the ED for evaluation of possible UTI.   Chart review indicates history of dementia at baseline.  Resides at a local SNF, [facility].  Takes Plavix daily.  Reportedly has not been acting himself with increased weakness  Patient presents to the ED for evaluation of confusion and possible UTI.  He was reportedly weak and confused, more so than normal, at his SNF and was referred to the ED for evaluation.  No known falls, injuries or events.  Patient has no complaints here in the ED.  He is pleasantly disoriented.  History limited.  Past Medical History:  Diagnosis Date   Back pain    Elevated LFTs    Hepatitis    HLD (hyperlipidemia)    Hypertension    Incomplete bladder emptying    Lower extremity ulceration (HCC)    Nocturia    Peripheral arterial disease (HCC)    Prostate cancer ([place]) remote   Cryotherapy by Dr. Ernst Spell.  PSA 5.54 in September at [facility]   Renal stones     Patient Active Problem List   Diagnosis Date Noted   Renovascular hypertension 10/13/2020   CKD (chronic kidney disease) 07/25/2020   Disease characterized by destruction of skeletal muscle 07/25/2020   Elevated AST (SGOT) 07/25/2020   Fall 07/25/2020   Hyperkalemia 07/25/2020   Urinary retention 07/25/2020   Generalized muscle weakness 08/18/2019   Asymptomatic proteinuria 02/22/2019   Chronic anemia 02/21/2019   High risk medication use 02/21/2019   Scalp hematoma 04/28/2018   Urine test positive for microalbuminuria 08/27/2017   Neck pain 07/01/2017   Prostate cancer ([place]) 01/16/2016   Elevated PSA 01/16/2016   Protein-calorie malnutrition,  severe 12/12/2015   Gross hematuria    Acute hepatitis 12/08/2015   Elevated LFTs    Abnormal liver function tests 12/06/2015   H/O malignant neoplasm of prostate 09/18/2015   Hypercholesteremia 05/17/2014   BP (high blood pressure) 05/17/2014   Photodermatitis due to sun 05/17/2014    Past Surgical History:  Procedure Laterality Date   AORTOGRAM     PERITONEAL CATHETER INSERTION     PROSTATE SURGERY     TRANSLUMINAL ANGIOPLASTY      Prior to Admission medications   Medication Sig Start Date End Date Taking? Authorizing Provider  cefdinir (OMNICEF) 300 MG capsule Take 1 capsule (300 mg total) by mouth 2 (two) times daily for 7 days. 11/26/21 12/03/21 Yes Vladimir Crofts, MD  ASPIRIN LOW DOSE 81 MG EC tablet Take 81 mg by mouth daily. 03/26/21   [provider]  atorvastatin (LIPITOR) 40 MG tablet Take 40 mg by mouth daily. 10/12/20   [provider]  clopidogrel (PLAVIX) 75 MG tablet Take by mouth. 08/02/20   [provider]  ferrous sulfate 325 (65 FE) MG EC tablet Take 1 tablet by mouth daily. 02/26/21   [provider]  finasteride (PROSCAR) 5 MG tablet Take 1 tablet (5 mg total) by mouth daily. 12/18/15   Roda Shutters, FNP  latanoprost (XALATAN) 0.005 % ophthalmic solution  09/23/15   [provider]  lisinopril (PRINIVIL,ZESTRIL) 20 MG tablet  09/25/15   [provider]  metoprolol tartrate (  LOPRESSOR) 25 MG tablet Take 25 mg by mouth 2 (two) times daily. 10/12/20   [provider]  tamsulosin (FLOMAX) 0.4 MG CAPS capsule Take 1 capsule (0.4 mg total) by mouth daily. At bedtime 09/27/15   Roda Shutters, FNP    Allergies Aspirin and Sulfamethoxazole-trimethoprim  Family History  Problem Relation Age of Onset   Heart disease Brother    Kidney Stones Sister    Heart attack Sister    Heart attack Brother    Kidney disease Neg Hx    Prostate cancer Neg Hx     Social History Social History   Tobacco Use    Smoking status: Former    Types: Cigarettes   Smokeless tobacco: Never   Tobacco comments:    quit 10 days  Substance Use Topics   Alcohol use: No    Alcohol/week: 0.0 standard drinks   Drug use: No    Review of Systems  Unable to accurately assess due to patient's disorientation ____________________________________________   PHYSICAL EXAM:  VITAL SIGNS: Vitals:   11/26/21 1119 11/26/21 1600  BP: (!) 203/86 (!) 165/81  Pulse: (!) 59 65  Resp: 18 18  Temp: 98.8 F (37.1 C) 98.1 F (36.7 C)  SpO2: 97% 98%     Constitutional: Alert and pleasantly disoriented. Well appearing and in no acute distress. Eyes: Conjunctivae are normal. PERRL. EOMI. Head: Atraumatic. Nose: No congestion/rhinnorhea. Mouth/Throat: Mucous membranes are moist.  Oropharynx non-erythematous. Neck: No stridor. No cervical spine tenderness to palpation. Cardiovascular: Normal rate, regular rhythm. Grossly normal heart sounds.  Good peripheral circulation. Respiratory: Normal respiratory effort.  No retractions. Lungs CTAB. Gastrointestinal: Soft , nondistended, nontender to palpation. No CVA tenderness. Musculoskeletal: No lower extremity tenderness nor edema.  No joint effusions. No signs of acute trauma. Neurologic:  Normal speech and language. No gross focal neurologic deficits are appreciated.  Cranial nerves II through XII intact 5/5 strength and sensation in all 4 extremities Skin:  Skin is warm, dry and intact. No rash noted. Psychiatric: Mood and affect are normal. Speech and behavior are normal. ____________________________________________   LABS (all labs ordered are listed, but only abnormal results are displayed)  Labs Reviewed  BASIC METABOLIC PANEL - Abnormal; Notable for the following components:      Result Value   Sodium 134 (*)    CO2 20 (*)    Glucose, Bld 126 (*)    BUN 29 (*)    Creatinine, Ser 1.92 (*)    GFR, Estimated 32 (*)    All other components within normal  limits  CBC - Abnormal; Notable for the following components:   RBC 3.82 (*)    Hemoglobin 11.1 (*)    HCT 34.9 (*)    RDW 17.1 (*)    All other components within normal limits  URINALYSIS, ROUTINE W REFLEX MICROSCOPIC - Abnormal; Notable for the following components:   APPearance CLEAR (*)    Hgb urine dipstick MODERATE (*)    Protein, ur 100 (*)    Leukocytes,Ua LARGE (*)    WBC, UA >50 (*)    Bacteria, UA MANY (*)    All other components within normal limits  URINE CULTURE  CBG MONITORING, ED   ____________________________________________  12 Lead EKG  Sinus rhythm, rate of 77 bpm.  Normal axis and intervals.  Nonspecific ST changes without STEMI. Similar to previous from April 2021 ____________________________________________  [place]  ED MD interpretation: 2 view CXR reviewed by me with right basilar infiltrate. CT  head reviewed by me without evidence of [place]  Official radiology report(s): DG Chest 2 View  Result Date: 11/26/2021 CLINICAL DATA:  Confused 87 year old male. EXAM: CHEST - 2 VIEW COMPARISON:  August of 2017. FINDINGS: Lung volumes are diminished compared to prior imaging. Subtle basilar interstitial and airspace disease. More dense opacity at the medial RIGHT lung base. No visible pneumothorax. No signs of effusion. On limited assessment there is no acute skeletal process. IMPRESSION: Low lung volumes with subtle basilar interstitial and airspace disease, more likely atelectasis. Early infection is considered as well. More dense opacity at the medial RIGHT lung base suspicious for consolidation/pneumonia. Would also correlate with risk factors for or history of aspiration. Consider follow-up radiograph in 6-8 weeks to ensure resolution after therapy as appropriate. Electronically Signed   By: Zetta Bills M.D.   On: 11/26/2021 16:56   CT HEAD WO CONTRAST (5MM)  Result Date: 11/26/2021 CLINICAL DATA:  Mental status change EXAM: CT HEAD WITHOUT CONTRAST  TECHNIQUE: Contiguous axial images were obtained from the base of the skull through the vertex without intravenous contrast. COMPARISON:  CT brain 08/25/2016 FINDINGS: Brain: No acute territorial infarction, hemorrhage, or intracranial mass. Moderate atrophy. Moderate chronic small vessel ischemic changes of the white matter. Chronic infarcts within the left greater than right basal ganglia but new compared to the prior head CT. Ventricles are nonenlarged. Vascular: No hyperdense vessels.  Carotid vascular calcification Skull: Normal. Negative for fracture or focal lesion. Sinuses/Orbits: Mucosal thickening in the sinuses Other: None IMPRESSION: 1. No CT evidence for acute intracranial abnormality. 2. Atrophy and chronic small vessel ischemic changes of the white matter. Chronic appearing basal ganglial infarcts. Electronically Signed   By: Donavan Foil M.D.   On: 11/26/2021 16:51    ____________________________________________   PROCEDURES and INTERVENTIONS  Procedure(s) performed (including Critical Care):  Procedures  Medications  cefdinir (OMNICEF) capsule 300 mg (has no administration in time range)    ____________________________________________   MDM / ED COURSE   87 year old male presents from a local SNF little more confused than normal, with evidence of a UTI and possibly pneumonia, ultimately amenable to return to facility with initiation of antibiotic regimen.  No evidence of sepsis, instability.  He is asymptomatic and looks well.  Benign abdomen and no respiratory distress.  Blood work is benign.  CT head without evidence of ICH and he has no signs of trauma.  Urine appears infectious and we will send for culture.  X-ray with possible infiltrate.  We will provide antibiotics to cover for both, a week of cefdinir.   Clinical Course as of 11/26/21 1746  Mon Nov 26, 2021  1617 Provide patient with a cup of water and urged him to provide a urine sample.  Provide urinal. [DS]     Clinical Course User Index [DS] Vladimir Crofts, MD    ____________________________________________   FINAL CLINICAL IMPRESSION(S) / ED DIAGNOSES  Final diagnoses:  Acute cystitis without hematuria  Community acquired pneumonia of right lower lobe of lung     ED Discharge Orders          Ordered    cefdinir (OMNICEF) 300 MG capsule  2 times daily        11/26/21 1738             Maddisyn Hegwood Tamala Julian   Note:  This document was prepared using Dragon voice recognition software and may include unintentional dictation errors.    Vladimir Crofts, MD 11/26/21 [phone]

## 2021-11-26 NOTE — ED Provider Notes (Signed)
Emergency Medicine Provider Triage Evaluation Note  Jon Smith , a 87 y.o. male  was evaluated in triage.  Pt complains of possible UTI due to increased fatigue and weakness  He presents from [facility] with baseline dementia, and with intermittent complaints of feeling weak.  Facility staff endorses the patient is not acting as he normally does.  Patient denies any symptoms or complaints of pain at this time.  He is on daily Plavix  Review of Systems  Positive: Fatigue, weakness Negative: FCWS  Physical Exam  BP (!) 203/86 (BP Location: Left Arm)   Pulse (!) 59   Temp 98.8 F (37.1 C) (Oral)   Resp 18   SpO2 97%  Gen:   Awake, no distress  NAD Resp:  Normal effort CTA MSK:   Moves extremities without difficulty  Other:  ABD: Soft, nontender  Medical Decision Making  Medically screening exam initiated at 11:22 AM.  Appropriate orders placed.  Macario Shear was informed that the remainder of the evaluation will be completed by another provider, this initial triage assessment does not replace that evaluation, and the importance of remaining in the ED until their evaluation is complete.  Patient with ED evaluation of concern for UTI due to his increased fatigue and weakness complaints from his facility.   Melvenia Needles, PA-C 11/26/21 1128    Carrie Mew, MD 11/26/21 [phone]

## 2021-11-26 NOTE — Discharge Instructions (Signed)
Jon Smith has signs of a UTI and possibly an early pneumonia as well.  He otherwise looks well.  He is being discharged with a 1 week course of antibiotics called cefdinir/Omnicef.    He got the first dose for the evening of Monday, start give the medication Tuesday morning and give twice daily for 7 days.  Return to the ED with any worsening symptoms.

## 2021-11-26 NOTE — ED Triage Notes (Addendum)
Pt comes with via EMS from the [place]. Pt with c/o possible UTI. Pt does have dementia. Pt states little weakness. Pt states he feels more tired when trying to move around. FAcility states pt is not acting like he normally does. Pt denies any urinary symptoms.  VSS

## 2021-11-27 DIAGNOSIS — M6281 Muscle weakness (generalized): Secondary | ICD-10-CM | POA: Diagnosis not present

## 2021-11-27 DIAGNOSIS — R1310 Dysphagia, unspecified: Secondary | ICD-10-CM | POA: Diagnosis not present

## 2021-11-28 DIAGNOSIS — R1310 Dysphagia, unspecified: Secondary | ICD-10-CM | POA: Diagnosis not present

## 2021-11-28 DIAGNOSIS — M6281 Muscle weakness (generalized): Secondary | ICD-10-CM | POA: Diagnosis not present

## 2021-11-28 DIAGNOSIS — I10 Essential (primary) hypertension: Secondary | ICD-10-CM | POA: Diagnosis not present

## 2021-11-29 DIAGNOSIS — M6281 Muscle weakness (generalized): Secondary | ICD-10-CM | POA: Diagnosis not present

## 2021-11-29 DIAGNOSIS — R1310 Dysphagia, unspecified: Secondary | ICD-10-CM | POA: Diagnosis not present

## 2021-11-29 LAB — URINE CULTURE: Culture: >=100000 — AB

## 2021-11-30 DIAGNOSIS — M6281 Muscle weakness (generalized): Secondary | ICD-10-CM | POA: Diagnosis not present

## 2021-11-30 DIAGNOSIS — R1310 Dysphagia, unspecified: Secondary | ICD-10-CM | POA: Diagnosis not present

## 2021-12-01 DIAGNOSIS — M6281 Muscle weakness (generalized): Secondary | ICD-10-CM | POA: Diagnosis not present

## 2021-12-01 DIAGNOSIS — R1310 Dysphagia, unspecified: Secondary | ICD-10-CM | POA: Diagnosis not present

## 2021-12-02 DIAGNOSIS — M6281 Muscle weakness (generalized): Secondary | ICD-10-CM | POA: Diagnosis not present

## 2021-12-02 DIAGNOSIS — R1310 Dysphagia, unspecified: Secondary | ICD-10-CM | POA: Diagnosis not present

## 2021-12-03 DIAGNOSIS — M6281 Muscle weakness (generalized): Secondary | ICD-10-CM | POA: Diagnosis not present

## 2021-12-03 DIAGNOSIS — R1310 Dysphagia, unspecified: Secondary | ICD-10-CM | POA: Diagnosis not present

## 2021-12-04 DIAGNOSIS — N3000 Acute cystitis without hematuria: Secondary | ICD-10-CM | POA: Diagnosis not present

## 2021-12-04 DIAGNOSIS — I639 Cerebral infarction, unspecified: Secondary | ICD-10-CM | POA: Diagnosis not present

## 2021-12-04 DIAGNOSIS — I10 Essential (primary) hypertension: Secondary | ICD-10-CM | POA: Diagnosis not present

## 2021-12-04 DIAGNOSIS — M6281 Muscle weakness (generalized): Secondary | ICD-10-CM | POA: Diagnosis not present

## 2021-12-04 DIAGNOSIS — J189 Pneumonia, unspecified organism: Secondary | ICD-10-CM | POA: Diagnosis not present

## 2021-12-04 DIAGNOSIS — N400 Enlarged prostate without lower urinary tract symptoms: Secondary | ICD-10-CM | POA: Diagnosis not present

## 2021-12-04 DIAGNOSIS — R1310 Dysphagia, unspecified: Secondary | ICD-10-CM | POA: Diagnosis not present

## 2021-12-05 DIAGNOSIS — M6281 Muscle weakness (generalized): Secondary | ICD-10-CM | POA: Diagnosis not present

## 2021-12-05 DIAGNOSIS — R1310 Dysphagia, unspecified: Secondary | ICD-10-CM | POA: Diagnosis not present

## 2021-12-06 DIAGNOSIS — M6281 Muscle weakness (generalized): Secondary | ICD-10-CM | POA: Diagnosis not present

## 2021-12-06 DIAGNOSIS — R1310 Dysphagia, unspecified: Secondary | ICD-10-CM | POA: Diagnosis not present

## 2021-12-07 DIAGNOSIS — R1310 Dysphagia, unspecified: Secondary | ICD-10-CM | POA: Diagnosis not present

## 2021-12-07 DIAGNOSIS — M6281 Muscle weakness (generalized): Secondary | ICD-10-CM | POA: Diagnosis not present

## 2021-12-08 DIAGNOSIS — M6281 Muscle weakness (generalized): Secondary | ICD-10-CM | POA: Diagnosis not present

## 2021-12-08 DIAGNOSIS — R1310 Dysphagia, unspecified: Secondary | ICD-10-CM | POA: Diagnosis not present

## 2021-12-09 DIAGNOSIS — M6281 Muscle weakness (generalized): Secondary | ICD-10-CM | POA: Diagnosis not present

## 2021-12-09 DIAGNOSIS — R1310 Dysphagia, unspecified: Secondary | ICD-10-CM | POA: Diagnosis not present

## 2021-12-10 DIAGNOSIS — M6281 Muscle weakness (generalized): Secondary | ICD-10-CM | POA: Diagnosis not present

## 2021-12-10 DIAGNOSIS — R1310 Dysphagia, unspecified: Secondary | ICD-10-CM | POA: Diagnosis not present

## 2021-12-11 DIAGNOSIS — M6281 Muscle weakness (generalized): Secondary | ICD-10-CM | POA: Diagnosis not present

## 2021-12-11 DIAGNOSIS — R1310 Dysphagia, unspecified: Secondary | ICD-10-CM | POA: Diagnosis not present

## 2021-12-12 DIAGNOSIS — M6281 Muscle weakness (generalized): Secondary | ICD-10-CM | POA: Diagnosis not present

## 2021-12-12 DIAGNOSIS — R1310 Dysphagia, unspecified: Secondary | ICD-10-CM | POA: Diagnosis not present

## 2021-12-13 DIAGNOSIS — D508 Other iron deficiency anemias: Secondary | ICD-10-CM | POA: Diagnosis not present

## 2021-12-13 DIAGNOSIS — E038 Other specified hypothyroidism: Secondary | ICD-10-CM | POA: Diagnosis not present

## 2021-12-13 DIAGNOSIS — R1310 Dysphagia, unspecified: Secondary | ICD-10-CM | POA: Diagnosis not present

## 2021-12-13 DIAGNOSIS — E785 Hyperlipidemia, unspecified: Secondary | ICD-10-CM | POA: Diagnosis not present

## 2021-12-13 DIAGNOSIS — Z79899 Other long term (current) drug therapy: Secondary | ICD-10-CM | POA: Diagnosis not present

## 2021-12-13 DIAGNOSIS — D518 Other vitamin B12 deficiency anemias: Secondary | ICD-10-CM | POA: Diagnosis not present

## 2021-12-13 DIAGNOSIS — E119 Type 2 diabetes mellitus without complications: Secondary | ICD-10-CM | POA: Diagnosis not present

## 2021-12-13 DIAGNOSIS — E559 Vitamin D deficiency, unspecified: Secondary | ICD-10-CM | POA: Diagnosis not present

## 2021-12-13 DIAGNOSIS — E7849 Other hyperlipidemia: Secondary | ICD-10-CM | POA: Diagnosis not present

## 2021-12-13 DIAGNOSIS — I639 Cerebral infarction, unspecified: Secondary | ICD-10-CM | POA: Diagnosis not present

## 2021-12-13 DIAGNOSIS — N400 Enlarged prostate without lower urinary tract symptoms: Secondary | ICD-10-CM | POA: Diagnosis not present

## 2021-12-13 DIAGNOSIS — I10 Essential (primary) hypertension: Secondary | ICD-10-CM | POA: Diagnosis not present

## 2021-12-13 DIAGNOSIS — M6281 Muscle weakness (generalized): Secondary | ICD-10-CM | POA: Diagnosis not present

## 2021-12-14 DIAGNOSIS — R1310 Dysphagia, unspecified: Secondary | ICD-10-CM | POA: Diagnosis not present

## 2021-12-14 DIAGNOSIS — M6281 Muscle weakness (generalized): Secondary | ICD-10-CM | POA: Diagnosis not present

## 2021-12-15 DIAGNOSIS — M6281 Muscle weakness (generalized): Secondary | ICD-10-CM | POA: Diagnosis not present

## 2021-12-15 DIAGNOSIS — R1310 Dysphagia, unspecified: Secondary | ICD-10-CM | POA: Diagnosis not present

## 2021-12-16 DIAGNOSIS — M6281 Muscle weakness (generalized): Secondary | ICD-10-CM | POA: Diagnosis not present

## 2021-12-16 DIAGNOSIS — R1310 Dysphagia, unspecified: Secondary | ICD-10-CM | POA: Diagnosis not present

## 2021-12-17 DIAGNOSIS — M6281 Muscle weakness (generalized): Secondary | ICD-10-CM | POA: Diagnosis not present

## 2021-12-17 DIAGNOSIS — R1310 Dysphagia, unspecified: Secondary | ICD-10-CM | POA: Diagnosis not present

## 2021-12-18 DIAGNOSIS — R1310 Dysphagia, unspecified: Secondary | ICD-10-CM | POA: Diagnosis not present

## 2021-12-18 DIAGNOSIS — M6281 Muscle weakness (generalized): Secondary | ICD-10-CM | POA: Diagnosis not present

## 2021-12-18 DIAGNOSIS — R011 Cardiac murmur, unspecified: Secondary | ICD-10-CM | POA: Diagnosis not present

## 2021-12-19 DIAGNOSIS — R1310 Dysphagia, unspecified: Secondary | ICD-10-CM | POA: Diagnosis not present

## 2021-12-19 DIAGNOSIS — M6281 Muscle weakness (generalized): Secondary | ICD-10-CM | POA: Diagnosis not present

## 2021-12-20 DIAGNOSIS — R159 Full incontinence of feces: Secondary | ICD-10-CM | POA: Diagnosis not present

## 2021-12-20 DIAGNOSIS — R1310 Dysphagia, unspecified: Secondary | ICD-10-CM | POA: Diagnosis not present

## 2021-12-20 DIAGNOSIS — M6281 Muscle weakness (generalized): Secondary | ICD-10-CM | POA: Diagnosis not present

## 2021-12-20 DIAGNOSIS — R32 Unspecified urinary incontinence: Secondary | ICD-10-CM | POA: Diagnosis not present

## 2021-12-21 DIAGNOSIS — M6281 Muscle weakness (generalized): Secondary | ICD-10-CM | POA: Diagnosis not present

## 2021-12-21 DIAGNOSIS — R1310 Dysphagia, unspecified: Secondary | ICD-10-CM | POA: Diagnosis not present

## 2021-12-22 DIAGNOSIS — M6281 Muscle weakness (generalized): Secondary | ICD-10-CM | POA: Diagnosis not present

## 2021-12-22 DIAGNOSIS — R1310 Dysphagia, unspecified: Secondary | ICD-10-CM | POA: Diagnosis not present

## 2021-12-23 DIAGNOSIS — M6281 Muscle weakness (generalized): Secondary | ICD-10-CM | POA: Diagnosis not present

## 2021-12-23 DIAGNOSIS — R1310 Dysphagia, unspecified: Secondary | ICD-10-CM | POA: Diagnosis not present

## 2021-12-24 DIAGNOSIS — M6281 Muscle weakness (generalized): Secondary | ICD-10-CM | POA: Diagnosis not present

## 2021-12-24 DIAGNOSIS — R1310 Dysphagia, unspecified: Secondary | ICD-10-CM | POA: Diagnosis not present

## 2021-12-25 DIAGNOSIS — R1310 Dysphagia, unspecified: Secondary | ICD-10-CM | POA: Diagnosis not present

## 2021-12-25 DIAGNOSIS — M6281 Muscle weakness (generalized): Secondary | ICD-10-CM | POA: Diagnosis not present

## 2021-12-26 DIAGNOSIS — R1310 Dysphagia, unspecified: Secondary | ICD-10-CM | POA: Diagnosis not present

## 2021-12-26 DIAGNOSIS — M6281 Muscle weakness (generalized): Secondary | ICD-10-CM | POA: Diagnosis not present

## 2021-12-27 DIAGNOSIS — I10 Essential (primary) hypertension: Secondary | ICD-10-CM | POA: Diagnosis not present

## 2021-12-27 DIAGNOSIS — R1310 Dysphagia, unspecified: Secondary | ICD-10-CM | POA: Diagnosis not present

## 2021-12-27 DIAGNOSIS — M6281 Muscle weakness (generalized): Secondary | ICD-10-CM | POA: Diagnosis not present

## 2021-12-28 DIAGNOSIS — R1310 Dysphagia, unspecified: Secondary | ICD-10-CM | POA: Diagnosis not present

## 2021-12-28 DIAGNOSIS — M6281 Muscle weakness (generalized): Secondary | ICD-10-CM | POA: Diagnosis not present

## 2021-12-29 DIAGNOSIS — R1310 Dysphagia, unspecified: Secondary | ICD-10-CM | POA: Diagnosis not present

## 2021-12-29 DIAGNOSIS — M6281 Muscle weakness (generalized): Secondary | ICD-10-CM | POA: Diagnosis not present

## 2021-12-30 DIAGNOSIS — M6281 Muscle weakness (generalized): Secondary | ICD-10-CM | POA: Diagnosis not present

## 2021-12-30 DIAGNOSIS — R1310 Dysphagia, unspecified: Secondary | ICD-10-CM | POA: Diagnosis not present

## 2021-12-31 DIAGNOSIS — R1310 Dysphagia, unspecified: Secondary | ICD-10-CM | POA: Diagnosis not present

## 2021-12-31 DIAGNOSIS — M6281 Muscle weakness (generalized): Secondary | ICD-10-CM | POA: Diagnosis not present

## 2022-01-01 DIAGNOSIS — R2681 Unsteadiness on feet: Secondary | ICD-10-CM | POA: Diagnosis not present

## 2022-01-01 DIAGNOSIS — N400 Enlarged prostate without lower urinary tract symptoms: Secondary | ICD-10-CM | POA: Diagnosis not present

## 2022-01-01 DIAGNOSIS — I639 Cerebral infarction, unspecified: Secondary | ICD-10-CM | POA: Diagnosis not present

## 2022-01-01 DIAGNOSIS — R1310 Dysphagia, unspecified: Secondary | ICD-10-CM | POA: Diagnosis not present

## 2022-01-01 DIAGNOSIS — D508 Other iron deficiency anemias: Secondary | ICD-10-CM | POA: Diagnosis not present

## 2022-01-01 DIAGNOSIS — I10 Essential (primary) hypertension: Secondary | ICD-10-CM | POA: Diagnosis not present

## 2022-01-01 DIAGNOSIS — M6281 Muscle weakness (generalized): Secondary | ICD-10-CM | POA: Diagnosis not present

## 2022-01-01 DIAGNOSIS — E785 Hyperlipidemia, unspecified: Secondary | ICD-10-CM | POA: Diagnosis not present

## 2022-01-02 DIAGNOSIS — M6281 Muscle weakness (generalized): Secondary | ICD-10-CM | POA: Diagnosis not present

## 2022-01-02 DIAGNOSIS — R1310 Dysphagia, unspecified: Secondary | ICD-10-CM | POA: Diagnosis not present

## 2022-01-03 DIAGNOSIS — M6281 Muscle weakness (generalized): Secondary | ICD-10-CM | POA: Diagnosis not present

## 2022-01-03 DIAGNOSIS — R1310 Dysphagia, unspecified: Secondary | ICD-10-CM | POA: Diagnosis not present

## 2022-01-04 DIAGNOSIS — R1310 Dysphagia, unspecified: Secondary | ICD-10-CM | POA: Diagnosis not present

## 2022-01-04 DIAGNOSIS — M6281 Muscle weakness (generalized): Secondary | ICD-10-CM | POA: Diagnosis not present

## 2022-01-05 DIAGNOSIS — R1310 Dysphagia, unspecified: Secondary | ICD-10-CM | POA: Diagnosis not present

## 2022-01-05 DIAGNOSIS — M6281 Muscle weakness (generalized): Secondary | ICD-10-CM | POA: Diagnosis not present

## 2022-01-06 DIAGNOSIS — M6281 Muscle weakness (generalized): Secondary | ICD-10-CM | POA: Diagnosis not present

## 2022-01-06 DIAGNOSIS — R1310 Dysphagia, unspecified: Secondary | ICD-10-CM | POA: Diagnosis not present

## 2022-01-07 DIAGNOSIS — R1310 Dysphagia, unspecified: Secondary | ICD-10-CM | POA: Diagnosis not present

## 2022-01-07 DIAGNOSIS — M6281 Muscle weakness (generalized): Secondary | ICD-10-CM | POA: Diagnosis not present

## 2022-01-08 DIAGNOSIS — R1310 Dysphagia, unspecified: Secondary | ICD-10-CM | POA: Diagnosis not present

## 2022-01-08 DIAGNOSIS — M6281 Muscle weakness (generalized): Secondary | ICD-10-CM | POA: Diagnosis not present

## 2022-01-09 DIAGNOSIS — R1310 Dysphagia, unspecified: Secondary | ICD-10-CM | POA: Diagnosis not present

## 2022-01-09 DIAGNOSIS — M6281 Muscle weakness (generalized): Secondary | ICD-10-CM | POA: Diagnosis not present

## 2022-01-10 DIAGNOSIS — R1310 Dysphagia, unspecified: Secondary | ICD-10-CM | POA: Diagnosis not present

## 2022-01-10 DIAGNOSIS — M6281 Muscle weakness (generalized): Secondary | ICD-10-CM | POA: Diagnosis not present

## 2022-01-11 DIAGNOSIS — M6281 Muscle weakness (generalized): Secondary | ICD-10-CM | POA: Diagnosis not present

## 2022-01-11 DIAGNOSIS — R1310 Dysphagia, unspecified: Secondary | ICD-10-CM | POA: Diagnosis not present

## 2022-01-12 DIAGNOSIS — M6281 Muscle weakness (generalized): Secondary | ICD-10-CM | POA: Diagnosis not present

## 2022-01-12 DIAGNOSIS — R1310 Dysphagia, unspecified: Secondary | ICD-10-CM | POA: Diagnosis not present

## 2022-01-13 DIAGNOSIS — M6281 Muscle weakness (generalized): Secondary | ICD-10-CM | POA: Diagnosis not present

## 2022-01-13 DIAGNOSIS — R1310 Dysphagia, unspecified: Secondary | ICD-10-CM | POA: Diagnosis not present

## 2022-01-14 DIAGNOSIS — R1310 Dysphagia, unspecified: Secondary | ICD-10-CM | POA: Diagnosis not present

## 2022-01-14 DIAGNOSIS — M6281 Muscle weakness (generalized): Secondary | ICD-10-CM | POA: Diagnosis not present

## 2022-01-15 DIAGNOSIS — R1310 Dysphagia, unspecified: Secondary | ICD-10-CM | POA: Diagnosis not present

## 2022-01-15 DIAGNOSIS — M6281 Muscle weakness (generalized): Secondary | ICD-10-CM | POA: Diagnosis not present

## 2022-01-16 DIAGNOSIS — M6281 Muscle weakness (generalized): Secondary | ICD-10-CM | POA: Diagnosis not present

## 2022-01-16 DIAGNOSIS — R1310 Dysphagia, unspecified: Secondary | ICD-10-CM | POA: Diagnosis not present

## 2022-01-17 DIAGNOSIS — R1310 Dysphagia, unspecified: Secondary | ICD-10-CM | POA: Diagnosis not present

## 2022-01-17 DIAGNOSIS — M6281 Muscle weakness (generalized): Secondary | ICD-10-CM | POA: Diagnosis not present

## 2022-01-18 DIAGNOSIS — M6281 Muscle weakness (generalized): Secondary | ICD-10-CM | POA: Diagnosis not present

## 2022-01-18 DIAGNOSIS — R1310 Dysphagia, unspecified: Secondary | ICD-10-CM | POA: Diagnosis not present

## 2022-01-19 DIAGNOSIS — M6281 Muscle weakness (generalized): Secondary | ICD-10-CM | POA: Diagnosis not present

## 2022-01-19 DIAGNOSIS — R1310 Dysphagia, unspecified: Secondary | ICD-10-CM | POA: Diagnosis not present

## 2022-01-20 DIAGNOSIS — M6281 Muscle weakness (generalized): Secondary | ICD-10-CM | POA: Diagnosis not present

## 2022-01-20 DIAGNOSIS — R1310 Dysphagia, unspecified: Secondary | ICD-10-CM | POA: Diagnosis not present

## 2022-01-21 DIAGNOSIS — M6281 Muscle weakness (generalized): Secondary | ICD-10-CM | POA: Diagnosis not present

## 2022-01-21 DIAGNOSIS — R1310 Dysphagia, unspecified: Secondary | ICD-10-CM | POA: Diagnosis not present

## 2022-01-22 DIAGNOSIS — R1310 Dysphagia, unspecified: Secondary | ICD-10-CM | POA: Diagnosis not present

## 2022-01-22 DIAGNOSIS — M6281 Muscle weakness (generalized): Secondary | ICD-10-CM | POA: Diagnosis not present

## 2022-01-22 DIAGNOSIS — R159 Full incontinence of feces: Secondary | ICD-10-CM | POA: Diagnosis not present

## 2022-01-22 DIAGNOSIS — R32 Unspecified urinary incontinence: Secondary | ICD-10-CM | POA: Diagnosis not present

## 2022-01-23 DIAGNOSIS — R1310 Dysphagia, unspecified: Secondary | ICD-10-CM | POA: Diagnosis not present

## 2022-01-23 DIAGNOSIS — M6281 Muscle weakness (generalized): Secondary | ICD-10-CM | POA: Diagnosis not present

## 2022-01-24 DIAGNOSIS — R1310 Dysphagia, unspecified: Secondary | ICD-10-CM | POA: Diagnosis not present

## 2022-01-24 DIAGNOSIS — M6281 Muscle weakness (generalized): Secondary | ICD-10-CM | POA: Diagnosis not present

## 2022-01-25 DIAGNOSIS — D508 Other iron deficiency anemias: Secondary | ICD-10-CM | POA: Diagnosis not present

## 2022-01-25 DIAGNOSIS — E559 Vitamin D deficiency, unspecified: Secondary | ICD-10-CM | POA: Diagnosis not present

## 2022-01-25 DIAGNOSIS — I639 Cerebral infarction, unspecified: Secondary | ICD-10-CM | POA: Diagnosis not present

## 2022-01-25 DIAGNOSIS — E119 Type 2 diabetes mellitus without complications: Secondary | ICD-10-CM | POA: Diagnosis not present

## 2022-01-25 DIAGNOSIS — M6281 Muscle weakness (generalized): Secondary | ICD-10-CM | POA: Diagnosis not present

## 2022-01-25 DIAGNOSIS — D518 Other vitamin B12 deficiency anemias: Secondary | ICD-10-CM | POA: Diagnosis not present

## 2022-01-25 DIAGNOSIS — R1310 Dysphagia, unspecified: Secondary | ICD-10-CM | POA: Diagnosis not present

## 2022-01-25 DIAGNOSIS — E785 Hyperlipidemia, unspecified: Secondary | ICD-10-CM | POA: Diagnosis not present

## 2022-01-25 DIAGNOSIS — E038 Other specified hypothyroidism: Secondary | ICD-10-CM | POA: Diagnosis not present

## 2022-01-25 DIAGNOSIS — I10 Essential (primary) hypertension: Secondary | ICD-10-CM | POA: Diagnosis not present

## 2022-01-25 DIAGNOSIS — N400 Enlarged prostate without lower urinary tract symptoms: Secondary | ICD-10-CM | POA: Diagnosis not present

## 2022-01-26 DIAGNOSIS — M6281 Muscle weakness (generalized): Secondary | ICD-10-CM | POA: Diagnosis not present

## 2022-01-26 DIAGNOSIS — R1310 Dysphagia, unspecified: Secondary | ICD-10-CM | POA: Diagnosis not present

## 2022-01-27 DIAGNOSIS — M6281 Muscle weakness (generalized): Secondary | ICD-10-CM | POA: Diagnosis not present

## 2022-01-27 DIAGNOSIS — R1310 Dysphagia, unspecified: Secondary | ICD-10-CM | POA: Diagnosis not present

## 2022-01-28 DIAGNOSIS — R1310 Dysphagia, unspecified: Secondary | ICD-10-CM | POA: Diagnosis not present

## 2022-01-28 DIAGNOSIS — M6281 Muscle weakness (generalized): Secondary | ICD-10-CM | POA: Diagnosis not present

## 2022-01-28 DIAGNOSIS — I10 Essential (primary) hypertension: Secondary | ICD-10-CM | POA: Diagnosis not present

## 2022-01-29 DIAGNOSIS — E785 Hyperlipidemia, unspecified: Secondary | ICD-10-CM | POA: Diagnosis not present

## 2022-01-29 DIAGNOSIS — M6281 Muscle weakness (generalized): Secondary | ICD-10-CM | POA: Diagnosis not present

## 2022-01-29 DIAGNOSIS — I10 Essential (primary) hypertension: Secondary | ICD-10-CM | POA: Diagnosis not present

## 2022-01-29 DIAGNOSIS — D508 Other iron deficiency anemias: Secondary | ICD-10-CM | POA: Diagnosis not present

## 2022-01-29 DIAGNOSIS — N400 Enlarged prostate without lower urinary tract symptoms: Secondary | ICD-10-CM | POA: Diagnosis not present

## 2022-01-29 DIAGNOSIS — R1310 Dysphagia, unspecified: Secondary | ICD-10-CM | POA: Diagnosis not present

## 2022-01-30 DIAGNOSIS — R1310 Dysphagia, unspecified: Secondary | ICD-10-CM | POA: Diagnosis not present

## 2022-01-30 DIAGNOSIS — M6281 Muscle weakness (generalized): Secondary | ICD-10-CM | POA: Diagnosis not present

## 2022-01-31 DIAGNOSIS — L603 Nail dystrophy: Secondary | ICD-10-CM | POA: Diagnosis not present

## 2022-01-31 DIAGNOSIS — M6281 Muscle weakness (generalized): Secondary | ICD-10-CM | POA: Diagnosis not present

## 2022-01-31 DIAGNOSIS — R1310 Dysphagia, unspecified: Secondary | ICD-10-CM | POA: Diagnosis not present

## 2022-01-31 DIAGNOSIS — I7091 Generalized atherosclerosis: Secondary | ICD-10-CM | POA: Diagnosis not present

## 2022-02-01 DIAGNOSIS — R1310 Dysphagia, unspecified: Secondary | ICD-10-CM | POA: Diagnosis not present

## 2022-02-01 DIAGNOSIS — M6281 Muscle weakness (generalized): Secondary | ICD-10-CM | POA: Diagnosis not present

## 2022-02-02 DIAGNOSIS — R1310 Dysphagia, unspecified: Secondary | ICD-10-CM | POA: Diagnosis not present

## 2022-02-02 DIAGNOSIS — M6281 Muscle weakness (generalized): Secondary | ICD-10-CM | POA: Diagnosis not present

## 2022-02-03 DIAGNOSIS — M6281 Muscle weakness (generalized): Secondary | ICD-10-CM | POA: Diagnosis not present

## 2022-02-03 DIAGNOSIS — R1310 Dysphagia, unspecified: Secondary | ICD-10-CM | POA: Diagnosis not present

## 2022-02-04 DIAGNOSIS — M6281 Muscle weakness (generalized): Secondary | ICD-10-CM | POA: Diagnosis not present

## 2022-02-04 DIAGNOSIS — R1310 Dysphagia, unspecified: Secondary | ICD-10-CM | POA: Diagnosis not present

## 2022-02-05 DIAGNOSIS — R1310 Dysphagia, unspecified: Secondary | ICD-10-CM | POA: Diagnosis not present

## 2022-02-05 DIAGNOSIS — M6281 Muscle weakness (generalized): Secondary | ICD-10-CM | POA: Diagnosis not present

## 2022-02-06 DIAGNOSIS — M6281 Muscle weakness (generalized): Secondary | ICD-10-CM | POA: Diagnosis not present

## 2022-02-06 DIAGNOSIS — R1310 Dysphagia, unspecified: Secondary | ICD-10-CM | POA: Diagnosis not present

## 2022-02-07 DIAGNOSIS — M6281 Muscle weakness (generalized): Secondary | ICD-10-CM | POA: Diagnosis not present

## 2022-02-07 DIAGNOSIS — R1310 Dysphagia, unspecified: Secondary | ICD-10-CM | POA: Diagnosis not present

## 2022-02-08 DIAGNOSIS — M6281 Muscle weakness (generalized): Secondary | ICD-10-CM | POA: Diagnosis not present

## 2022-02-08 DIAGNOSIS — R1310 Dysphagia, unspecified: Secondary | ICD-10-CM | POA: Diagnosis not present

## 2022-02-09 DIAGNOSIS — M6281 Muscle weakness (generalized): Secondary | ICD-10-CM | POA: Diagnosis not present

## 2022-02-09 DIAGNOSIS — R1310 Dysphagia, unspecified: Secondary | ICD-10-CM | POA: Diagnosis not present

## 2022-02-10 DIAGNOSIS — M6281 Muscle weakness (generalized): Secondary | ICD-10-CM | POA: Diagnosis not present

## 2022-02-10 DIAGNOSIS — R1310 Dysphagia, unspecified: Secondary | ICD-10-CM | POA: Diagnosis not present

## 2022-02-11 DIAGNOSIS — M6281 Muscle weakness (generalized): Secondary | ICD-10-CM | POA: Diagnosis not present

## 2022-02-11 DIAGNOSIS — E785 Hyperlipidemia, unspecified: Secondary | ICD-10-CM | POA: Diagnosis not present

## 2022-02-11 DIAGNOSIS — D508 Other iron deficiency anemias: Secondary | ICD-10-CM | POA: Diagnosis not present

## 2022-02-11 DIAGNOSIS — E559 Vitamin D deficiency, unspecified: Secondary | ICD-10-CM | POA: Diagnosis not present

## 2022-02-11 DIAGNOSIS — R1310 Dysphagia, unspecified: Secondary | ICD-10-CM | POA: Diagnosis not present

## 2022-02-11 DIAGNOSIS — E038 Other specified hypothyroidism: Secondary | ICD-10-CM | POA: Diagnosis not present

## 2022-02-11 DIAGNOSIS — I639 Cerebral infarction, unspecified: Secondary | ICD-10-CM | POA: Diagnosis not present

## 2022-02-11 DIAGNOSIS — D518 Other vitamin B12 deficiency anemias: Secondary | ICD-10-CM | POA: Diagnosis not present

## 2022-02-11 DIAGNOSIS — N400 Enlarged prostate without lower urinary tract symptoms: Secondary | ICD-10-CM | POA: Diagnosis not present

## 2022-02-11 DIAGNOSIS — E119 Type 2 diabetes mellitus without complications: Secondary | ICD-10-CM | POA: Diagnosis not present

## 2022-02-11 DIAGNOSIS — I10 Essential (primary) hypertension: Secondary | ICD-10-CM | POA: Diagnosis not present

## 2022-02-12 DIAGNOSIS — R1310 Dysphagia, unspecified: Secondary | ICD-10-CM | POA: Diagnosis not present

## 2022-02-12 DIAGNOSIS — M6281 Muscle weakness (generalized): Secondary | ICD-10-CM | POA: Diagnosis not present

## 2022-02-13 DIAGNOSIS — M6281 Muscle weakness (generalized): Secondary | ICD-10-CM | POA: Diagnosis not present

## 2022-02-13 DIAGNOSIS — R1310 Dysphagia, unspecified: Secondary | ICD-10-CM | POA: Diagnosis not present

## 2022-02-14 DIAGNOSIS — R1310 Dysphagia, unspecified: Secondary | ICD-10-CM | POA: Diagnosis not present

## 2022-02-14 DIAGNOSIS — M6281 Muscle weakness (generalized): Secondary | ICD-10-CM | POA: Diagnosis not present

## 2022-02-15 DIAGNOSIS — R1310 Dysphagia, unspecified: Secondary | ICD-10-CM | POA: Diagnosis not present

## 2022-02-15 DIAGNOSIS — M6281 Muscle weakness (generalized): Secondary | ICD-10-CM | POA: Diagnosis not present

## 2022-02-16 DIAGNOSIS — M6281 Muscle weakness (generalized): Secondary | ICD-10-CM | POA: Diagnosis not present

## 2022-02-16 DIAGNOSIS — R1310 Dysphagia, unspecified: Secondary | ICD-10-CM | POA: Diagnosis not present

## 2022-02-17 DIAGNOSIS — M6281 Muscle weakness (generalized): Secondary | ICD-10-CM | POA: Diagnosis not present

## 2022-02-17 DIAGNOSIS — R1310 Dysphagia, unspecified: Secondary | ICD-10-CM | POA: Diagnosis not present

## 2022-02-18 DIAGNOSIS — M6281 Muscle weakness (generalized): Secondary | ICD-10-CM | POA: Diagnosis not present

## 2022-02-18 DIAGNOSIS — R1310 Dysphagia, unspecified: Secondary | ICD-10-CM | POA: Diagnosis not present

## 2022-02-19 DIAGNOSIS — R1310 Dysphagia, unspecified: Secondary | ICD-10-CM | POA: Diagnosis not present

## 2022-02-19 DIAGNOSIS — M6281 Muscle weakness (generalized): Secondary | ICD-10-CM | POA: Diagnosis not present

## 2022-02-20 DIAGNOSIS — R1310 Dysphagia, unspecified: Secondary | ICD-10-CM | POA: Diagnosis not present

## 2022-02-20 DIAGNOSIS — M6281 Muscle weakness (generalized): Secondary | ICD-10-CM | POA: Diagnosis not present

## 2022-02-20 DIAGNOSIS — R32 Unspecified urinary incontinence: Secondary | ICD-10-CM | POA: Diagnosis not present

## 2022-02-20 DIAGNOSIS — R159 Full incontinence of feces: Secondary | ICD-10-CM | POA: Diagnosis not present

## 2022-02-21 DIAGNOSIS — M6281 Muscle weakness (generalized): Secondary | ICD-10-CM | POA: Diagnosis not present

## 2022-02-21 DIAGNOSIS — R1310 Dysphagia, unspecified: Secondary | ICD-10-CM | POA: Diagnosis not present

## 2022-02-22 DIAGNOSIS — M6281 Muscle weakness (generalized): Secondary | ICD-10-CM | POA: Diagnosis not present

## 2022-02-22 DIAGNOSIS — R1310 Dysphagia, unspecified: Secondary | ICD-10-CM | POA: Diagnosis not present

## 2022-02-23 DIAGNOSIS — M6281 Muscle weakness (generalized): Secondary | ICD-10-CM | POA: Diagnosis not present

## 2022-02-23 DIAGNOSIS — R1310 Dysphagia, unspecified: Secondary | ICD-10-CM | POA: Diagnosis not present

## 2022-02-24 DIAGNOSIS — M6281 Muscle weakness (generalized): Secondary | ICD-10-CM | POA: Diagnosis not present

## 2022-02-24 DIAGNOSIS — R1310 Dysphagia, unspecified: Secondary | ICD-10-CM | POA: Diagnosis not present

## 2022-02-25 DIAGNOSIS — R1310 Dysphagia, unspecified: Secondary | ICD-10-CM | POA: Diagnosis not present

## 2022-02-25 DIAGNOSIS — M6281 Muscle weakness (generalized): Secondary | ICD-10-CM | POA: Diagnosis not present

## 2022-02-26 DIAGNOSIS — I10 Essential (primary) hypertension: Secondary | ICD-10-CM | POA: Diagnosis not present

## 2022-02-26 DIAGNOSIS — R1310 Dysphagia, unspecified: Secondary | ICD-10-CM | POA: Diagnosis not present

## 2022-02-26 DIAGNOSIS — N400 Enlarged prostate without lower urinary tract symptoms: Secondary | ICD-10-CM | POA: Diagnosis not present

## 2022-02-26 DIAGNOSIS — R2681 Unsteadiness on feet: Secondary | ICD-10-CM | POA: Diagnosis not present

## 2022-02-26 DIAGNOSIS — M6281 Muscle weakness (generalized): Secondary | ICD-10-CM | POA: Diagnosis not present

## 2022-02-26 DIAGNOSIS — E785 Hyperlipidemia, unspecified: Secondary | ICD-10-CM | POA: Diagnosis not present

## 2022-02-27 DIAGNOSIS — R1310 Dysphagia, unspecified: Secondary | ICD-10-CM | POA: Diagnosis not present

## 2022-02-27 DIAGNOSIS — M6281 Muscle weakness (generalized): Secondary | ICD-10-CM | POA: Diagnosis not present

## 2022-02-28 DIAGNOSIS — M6281 Muscle weakness (generalized): Secondary | ICD-10-CM | POA: Diagnosis not present

## 2022-02-28 DIAGNOSIS — R1310 Dysphagia, unspecified: Secondary | ICD-10-CM | POA: Diagnosis not present

## 2022-03-01 DIAGNOSIS — R1310 Dysphagia, unspecified: Secondary | ICD-10-CM | POA: Diagnosis not present

## 2022-03-01 DIAGNOSIS — M6281 Muscle weakness (generalized): Secondary | ICD-10-CM | POA: Diagnosis not present

## 2022-03-02 DIAGNOSIS — M6281 Muscle weakness (generalized): Secondary | ICD-10-CM | POA: Diagnosis not present

## 2022-03-02 DIAGNOSIS — R1310 Dysphagia, unspecified: Secondary | ICD-10-CM | POA: Diagnosis not present

## 2022-03-03 DIAGNOSIS — R1310 Dysphagia, unspecified: Secondary | ICD-10-CM | POA: Diagnosis not present

## 2022-03-03 DIAGNOSIS — M6281 Muscle weakness (generalized): Secondary | ICD-10-CM | POA: Diagnosis not present

## 2022-03-04 DIAGNOSIS — R1310 Dysphagia, unspecified: Secondary | ICD-10-CM | POA: Diagnosis not present

## 2022-03-04 DIAGNOSIS — M6281 Muscle weakness (generalized): Secondary | ICD-10-CM | POA: Diagnosis not present

## 2022-03-05 DIAGNOSIS — R1310 Dysphagia, unspecified: Secondary | ICD-10-CM | POA: Diagnosis not present

## 2022-03-05 DIAGNOSIS — M6281 Muscle weakness (generalized): Secondary | ICD-10-CM | POA: Diagnosis not present

## 2022-03-06 DIAGNOSIS — R1310 Dysphagia, unspecified: Secondary | ICD-10-CM | POA: Diagnosis not present

## 2022-03-06 DIAGNOSIS — M6281 Muscle weakness (generalized): Secondary | ICD-10-CM | POA: Diagnosis not present

## 2022-03-07 DIAGNOSIS — E038 Other specified hypothyroidism: Secondary | ICD-10-CM | POA: Diagnosis not present

## 2022-03-07 DIAGNOSIS — E785 Hyperlipidemia, unspecified: Secondary | ICD-10-CM | POA: Diagnosis not present

## 2022-03-07 DIAGNOSIS — R1310 Dysphagia, unspecified: Secondary | ICD-10-CM | POA: Diagnosis not present

## 2022-03-07 DIAGNOSIS — I639 Cerebral infarction, unspecified: Secondary | ICD-10-CM | POA: Diagnosis not present

## 2022-03-07 DIAGNOSIS — N400 Enlarged prostate without lower urinary tract symptoms: Secondary | ICD-10-CM | POA: Diagnosis not present

## 2022-03-07 DIAGNOSIS — D508 Other iron deficiency anemias: Secondary | ICD-10-CM | POA: Diagnosis not present

## 2022-03-07 DIAGNOSIS — E559 Vitamin D deficiency, unspecified: Secondary | ICD-10-CM | POA: Diagnosis not present

## 2022-03-07 DIAGNOSIS — D518 Other vitamin B12 deficiency anemias: Secondary | ICD-10-CM | POA: Diagnosis not present

## 2022-03-07 DIAGNOSIS — M6281 Muscle weakness (generalized): Secondary | ICD-10-CM | POA: Diagnosis not present

## 2022-03-07 DIAGNOSIS — E119 Type 2 diabetes mellitus without complications: Secondary | ICD-10-CM | POA: Diagnosis not present

## 2022-03-07 DIAGNOSIS — I10 Essential (primary) hypertension: Secondary | ICD-10-CM | POA: Diagnosis not present

## 2022-03-08 DIAGNOSIS — M6281 Muscle weakness (generalized): Secondary | ICD-10-CM | POA: Diagnosis not present

## 2022-03-08 DIAGNOSIS — R1310 Dysphagia, unspecified: Secondary | ICD-10-CM | POA: Diagnosis not present

## 2022-03-09 DIAGNOSIS — R1310 Dysphagia, unspecified: Secondary | ICD-10-CM | POA: Diagnosis not present

## 2022-03-09 DIAGNOSIS — M6281 Muscle weakness (generalized): Secondary | ICD-10-CM | POA: Diagnosis not present

## 2022-03-10 DIAGNOSIS — R1310 Dysphagia, unspecified: Secondary | ICD-10-CM | POA: Diagnosis not present

## 2022-03-10 DIAGNOSIS — M6281 Muscle weakness (generalized): Secondary | ICD-10-CM | POA: Diagnosis not present

## 2022-03-11 DIAGNOSIS — M6281 Muscle weakness (generalized): Secondary | ICD-10-CM | POA: Diagnosis not present

## 2022-03-11 DIAGNOSIS — R1310 Dysphagia, unspecified: Secondary | ICD-10-CM | POA: Diagnosis not present

## 2022-03-12 DIAGNOSIS — R1310 Dysphagia, unspecified: Secondary | ICD-10-CM | POA: Diagnosis not present

## 2022-03-12 DIAGNOSIS — M6281 Muscle weakness (generalized): Secondary | ICD-10-CM | POA: Diagnosis not present

## 2022-03-13 DIAGNOSIS — R1310 Dysphagia, unspecified: Secondary | ICD-10-CM | POA: Diagnosis not present

## 2022-03-13 DIAGNOSIS — M6281 Muscle weakness (generalized): Secondary | ICD-10-CM | POA: Diagnosis not present

## 2022-03-14 DIAGNOSIS — R1310 Dysphagia, unspecified: Secondary | ICD-10-CM | POA: Diagnosis not present

## 2022-03-14 DIAGNOSIS — M6281 Muscle weakness (generalized): Secondary | ICD-10-CM | POA: Diagnosis not present

## 2022-03-15 DIAGNOSIS — M6281 Muscle weakness (generalized): Secondary | ICD-10-CM | POA: Diagnosis not present

## 2022-03-15 DIAGNOSIS — R1310 Dysphagia, unspecified: Secondary | ICD-10-CM | POA: Diagnosis not present

## 2022-03-16 DIAGNOSIS — M6281 Muscle weakness (generalized): Secondary | ICD-10-CM | POA: Diagnosis not present

## 2022-03-16 DIAGNOSIS — R1310 Dysphagia, unspecified: Secondary | ICD-10-CM | POA: Diagnosis not present

## 2022-03-17 DIAGNOSIS — R1310 Dysphagia, unspecified: Secondary | ICD-10-CM | POA: Diagnosis not present

## 2022-03-17 DIAGNOSIS — M6281 Muscle weakness (generalized): Secondary | ICD-10-CM | POA: Diagnosis not present

## 2022-03-18 DIAGNOSIS — M6281 Muscle weakness (generalized): Secondary | ICD-10-CM | POA: Diagnosis not present

## 2022-03-18 DIAGNOSIS — R1310 Dysphagia, unspecified: Secondary | ICD-10-CM | POA: Diagnosis not present

## 2022-03-19 DIAGNOSIS — M6281 Muscle weakness (generalized): Secondary | ICD-10-CM | POA: Diagnosis not present

## 2022-03-19 DIAGNOSIS — R1310 Dysphagia, unspecified: Secondary | ICD-10-CM | POA: Diagnosis not present

## 2022-03-20 DIAGNOSIS — M6281 Muscle weakness (generalized): Secondary | ICD-10-CM | POA: Diagnosis not present

## 2022-03-20 DIAGNOSIS — R1310 Dysphagia, unspecified: Secondary | ICD-10-CM | POA: Diagnosis not present

## 2022-03-20 DIAGNOSIS — R32 Unspecified urinary incontinence: Secondary | ICD-10-CM | POA: Diagnosis not present

## 2022-03-20 DIAGNOSIS — R159 Full incontinence of feces: Secondary | ICD-10-CM | POA: Diagnosis not present

## 2022-03-21 DIAGNOSIS — R1310 Dysphagia, unspecified: Secondary | ICD-10-CM | POA: Diagnosis not present

## 2022-03-21 DIAGNOSIS — M6281 Muscle weakness (generalized): Secondary | ICD-10-CM | POA: Diagnosis not present

## 2022-03-22 DIAGNOSIS — R1310 Dysphagia, unspecified: Secondary | ICD-10-CM | POA: Diagnosis not present

## 2022-03-22 DIAGNOSIS — M6281 Muscle weakness (generalized): Secondary | ICD-10-CM | POA: Diagnosis not present

## 2022-03-23 DIAGNOSIS — R1310 Dysphagia, unspecified: Secondary | ICD-10-CM | POA: Diagnosis not present

## 2022-03-23 DIAGNOSIS — M6281 Muscle weakness (generalized): Secondary | ICD-10-CM | POA: Diagnosis not present

## 2022-03-24 DIAGNOSIS — M6281 Muscle weakness (generalized): Secondary | ICD-10-CM | POA: Diagnosis not present

## 2022-03-24 DIAGNOSIS — R1310 Dysphagia, unspecified: Secondary | ICD-10-CM | POA: Diagnosis not present

## 2022-03-25 DIAGNOSIS — R1310 Dysphagia, unspecified: Secondary | ICD-10-CM | POA: Diagnosis not present

## 2022-03-25 DIAGNOSIS — M6281 Muscle weakness (generalized): Secondary | ICD-10-CM | POA: Diagnosis not present

## 2022-03-26 DIAGNOSIS — M6281 Muscle weakness (generalized): Secondary | ICD-10-CM | POA: Diagnosis not present

## 2022-03-26 DIAGNOSIS — R1310 Dysphagia, unspecified: Secondary | ICD-10-CM | POA: Diagnosis not present

## 2022-03-26 DIAGNOSIS — I10 Essential (primary) hypertension: Secondary | ICD-10-CM | POA: Diagnosis not present

## 2022-03-27 DIAGNOSIS — M6281 Muscle weakness (generalized): Secondary | ICD-10-CM | POA: Diagnosis not present

## 2022-03-27 DIAGNOSIS — R1310 Dysphagia, unspecified: Secondary | ICD-10-CM | POA: Diagnosis not present

## 2022-03-28 DIAGNOSIS — R1310 Dysphagia, unspecified: Secondary | ICD-10-CM | POA: Diagnosis not present

## 2022-03-28 DIAGNOSIS — M6281 Muscle weakness (generalized): Secondary | ICD-10-CM | POA: Diagnosis not present

## 2022-03-29 DIAGNOSIS — M6281 Muscle weakness (generalized): Secondary | ICD-10-CM | POA: Diagnosis not present

## 2022-03-29 DIAGNOSIS — R1310 Dysphagia, unspecified: Secondary | ICD-10-CM | POA: Diagnosis not present

## 2022-03-30 DIAGNOSIS — R1310 Dysphagia, unspecified: Secondary | ICD-10-CM | POA: Diagnosis not present

## 2022-03-30 DIAGNOSIS — M6281 Muscle weakness (generalized): Secondary | ICD-10-CM | POA: Diagnosis not present

## 2022-03-31 DIAGNOSIS — R1310 Dysphagia, unspecified: Secondary | ICD-10-CM | POA: Diagnosis not present

## 2022-03-31 DIAGNOSIS — M6281 Muscle weakness (generalized): Secondary | ICD-10-CM | POA: Diagnosis not present

## 2022-04-01 DIAGNOSIS — R1310 Dysphagia, unspecified: Secondary | ICD-10-CM | POA: Diagnosis not present

## 2022-04-01 DIAGNOSIS — M6281 Muscle weakness (generalized): Secondary | ICD-10-CM | POA: Diagnosis not present

## 2022-04-02 DIAGNOSIS — M6281 Muscle weakness (generalized): Secondary | ICD-10-CM | POA: Diagnosis not present

## 2022-04-02 DIAGNOSIS — R1310 Dysphagia, unspecified: Secondary | ICD-10-CM | POA: Diagnosis not present

## 2022-04-03 DIAGNOSIS — L603 Nail dystrophy: Secondary | ICD-10-CM | POA: Diagnosis not present

## 2022-04-03 DIAGNOSIS — I7091 Generalized atherosclerosis: Secondary | ICD-10-CM | POA: Diagnosis not present

## 2022-04-03 DIAGNOSIS — M6281 Muscle weakness (generalized): Secondary | ICD-10-CM | POA: Diagnosis not present

## 2022-04-03 DIAGNOSIS — R1310 Dysphagia, unspecified: Secondary | ICD-10-CM | POA: Diagnosis not present

## 2022-04-04 DIAGNOSIS — R1310 Dysphagia, unspecified: Secondary | ICD-10-CM | POA: Diagnosis not present

## 2022-04-04 DIAGNOSIS — M6281 Muscle weakness (generalized): Secondary | ICD-10-CM | POA: Diagnosis not present

## 2022-04-05 DIAGNOSIS — R1310 Dysphagia, unspecified: Secondary | ICD-10-CM | POA: Diagnosis not present

## 2022-04-05 DIAGNOSIS — M6281 Muscle weakness (generalized): Secondary | ICD-10-CM | POA: Diagnosis not present

## 2022-04-06 DIAGNOSIS — R1310 Dysphagia, unspecified: Secondary | ICD-10-CM | POA: Diagnosis not present

## 2022-04-06 DIAGNOSIS — M6281 Muscle weakness (generalized): Secondary | ICD-10-CM | POA: Diagnosis not present

## 2022-04-07 DIAGNOSIS — R1310 Dysphagia, unspecified: Secondary | ICD-10-CM | POA: Diagnosis not present

## 2022-04-07 DIAGNOSIS — M6281 Muscle weakness (generalized): Secondary | ICD-10-CM | POA: Diagnosis not present

## 2022-04-08 DIAGNOSIS — R1310 Dysphagia, unspecified: Secondary | ICD-10-CM | POA: Diagnosis not present

## 2022-04-08 DIAGNOSIS — M6281 Muscle weakness (generalized): Secondary | ICD-10-CM | POA: Diagnosis not present

## 2022-04-09 DIAGNOSIS — M6281 Muscle weakness (generalized): Secondary | ICD-10-CM | POA: Diagnosis not present

## 2022-04-09 DIAGNOSIS — R1310 Dysphagia, unspecified: Secondary | ICD-10-CM | POA: Diagnosis not present

## 2022-04-10 DIAGNOSIS — R1310 Dysphagia, unspecified: Secondary | ICD-10-CM | POA: Diagnosis not present

## 2022-04-10 DIAGNOSIS — M6281 Muscle weakness (generalized): Secondary | ICD-10-CM | POA: Diagnosis not present

## 2022-04-11 DIAGNOSIS — M6281 Muscle weakness (generalized): Secondary | ICD-10-CM | POA: Diagnosis not present

## 2022-04-11 DIAGNOSIS — R1310 Dysphagia, unspecified: Secondary | ICD-10-CM | POA: Diagnosis not present

## 2022-04-12 DIAGNOSIS — I639 Cerebral infarction, unspecified: Secondary | ICD-10-CM | POA: Diagnosis not present

## 2022-04-12 DIAGNOSIS — E559 Vitamin D deficiency, unspecified: Secondary | ICD-10-CM | POA: Diagnosis not present

## 2022-04-12 DIAGNOSIS — E038 Other specified hypothyroidism: Secondary | ICD-10-CM | POA: Diagnosis not present

## 2022-04-12 DIAGNOSIS — E785 Hyperlipidemia, unspecified: Secondary | ICD-10-CM | POA: Diagnosis not present

## 2022-04-12 DIAGNOSIS — M6281 Muscle weakness (generalized): Secondary | ICD-10-CM | POA: Diagnosis not present

## 2022-04-12 DIAGNOSIS — D508 Other iron deficiency anemias: Secondary | ICD-10-CM | POA: Diagnosis not present

## 2022-04-12 DIAGNOSIS — D518 Other vitamin B12 deficiency anemias: Secondary | ICD-10-CM | POA: Diagnosis not present

## 2022-04-12 DIAGNOSIS — I10 Essential (primary) hypertension: Secondary | ICD-10-CM | POA: Diagnosis not present

## 2022-04-12 DIAGNOSIS — R1310 Dysphagia, unspecified: Secondary | ICD-10-CM | POA: Diagnosis not present

## 2022-04-12 DIAGNOSIS — E119 Type 2 diabetes mellitus without complications: Secondary | ICD-10-CM | POA: Diagnosis not present

## 2022-04-12 DIAGNOSIS — N400 Enlarged prostate without lower urinary tract symptoms: Secondary | ICD-10-CM | POA: Diagnosis not present

## 2022-04-13 DIAGNOSIS — R1310 Dysphagia, unspecified: Secondary | ICD-10-CM | POA: Diagnosis not present

## 2022-04-13 DIAGNOSIS — M6281 Muscle weakness (generalized): Secondary | ICD-10-CM | POA: Diagnosis not present

## 2022-04-14 DIAGNOSIS — M6281 Muscle weakness (generalized): Secondary | ICD-10-CM | POA: Diagnosis not present

## 2022-04-14 DIAGNOSIS — R1310 Dysphagia, unspecified: Secondary | ICD-10-CM | POA: Diagnosis not present

## 2022-04-15 DIAGNOSIS — R1310 Dysphagia, unspecified: Secondary | ICD-10-CM | POA: Diagnosis not present

## 2022-04-15 DIAGNOSIS — M6281 Muscle weakness (generalized): Secondary | ICD-10-CM | POA: Diagnosis not present

## 2022-04-16 DIAGNOSIS — M6281 Muscle weakness (generalized): Secondary | ICD-10-CM | POA: Diagnosis not present

## 2022-04-16 DIAGNOSIS — R1310 Dysphagia, unspecified: Secondary | ICD-10-CM | POA: Diagnosis not present

## 2022-04-17 DIAGNOSIS — M6281 Muscle weakness (generalized): Secondary | ICD-10-CM | POA: Diagnosis not present

## 2022-04-17 DIAGNOSIS — R1310 Dysphagia, unspecified: Secondary | ICD-10-CM | POA: Diagnosis not present

## 2022-04-18 DIAGNOSIS — R1310 Dysphagia, unspecified: Secondary | ICD-10-CM | POA: Diagnosis not present

## 2022-04-18 DIAGNOSIS — M6281 Muscle weakness (generalized): Secondary | ICD-10-CM | POA: Diagnosis not present

## 2022-04-19 DIAGNOSIS — R1310 Dysphagia, unspecified: Secondary | ICD-10-CM | POA: Diagnosis not present

## 2022-04-19 DIAGNOSIS — M6281 Muscle weakness (generalized): Secondary | ICD-10-CM | POA: Diagnosis not present

## 2022-04-20 DIAGNOSIS — R1310 Dysphagia, unspecified: Secondary | ICD-10-CM | POA: Diagnosis not present

## 2022-04-20 DIAGNOSIS — M6281 Muscle weakness (generalized): Secondary | ICD-10-CM | POA: Diagnosis not present

## 2022-04-21 DIAGNOSIS — R1310 Dysphagia, unspecified: Secondary | ICD-10-CM | POA: Diagnosis not present

## 2022-04-21 DIAGNOSIS — M6281 Muscle weakness (generalized): Secondary | ICD-10-CM | POA: Diagnosis not present

## 2022-04-22 DIAGNOSIS — M6281 Muscle weakness (generalized): Secondary | ICD-10-CM | POA: Diagnosis not present

## 2022-04-22 DIAGNOSIS — R1310 Dysphagia, unspecified: Secondary | ICD-10-CM | POA: Diagnosis not present

## 2022-04-22 DIAGNOSIS — R32 Unspecified urinary incontinence: Secondary | ICD-10-CM | POA: Diagnosis not present

## 2022-04-22 DIAGNOSIS — R159 Full incontinence of feces: Secondary | ICD-10-CM | POA: Diagnosis not present

## 2022-04-23 ENCOUNTER — Ambulatory Visit: Payer: Medicare HMO | Admitting: Urology

## 2022-04-23 DIAGNOSIS — M6281 Muscle weakness (generalized): Secondary | ICD-10-CM | POA: Diagnosis not present

## 2022-04-23 DIAGNOSIS — R1310 Dysphagia, unspecified: Secondary | ICD-10-CM | POA: Diagnosis not present

## 2022-04-23 NOTE — Progress Notes (Signed)
? ?04/25/22 ?8:45 AM  ? ?Jon Smith ?1928-01-11 ?[id number] ? ?Referring provider:  ?[place] ?[place],  [place] [address] ?No chief complaint on file. ? ? ?HPI: ?Jon Smith is a 88 y.o.male with a personal history of  CKD, nephrolithiasis, gross hematuria, BPH with incomplete bladder emptying on Flomax and finasteride, elevated PSA, asymptomatic bacteriuria, and prostate cancer s/p cryotherapy who presents today for 28-monthfollow-up with PVR.  ? ?He is doing well today on a urinary standpoint.  He denies any significant urgency, frequency, incontinence.  He gets up 1 or 2 times at night.  He is not bothered by his urinary symptoms. ? ?He does have a personal history of elevated PVRs in the 1 50-2 50 range.  PVR today is stable, see below. ? ?No UTIs or hematuria.  He was treated in November for an E. coli UTI with nonspecific symptoms including worsening confusion.  He does have a known history of chronic bacteriuria. ? ?Also has a personal history of elevated PSA, PSA today is pending. ? ? ? ?PMH: ?Past Medical History:  ?Diagnosis Date  ? Back pain   ? Elevated LFTs   ? Hepatitis   ? HLD (hyperlipidemia)   ? Hypertension   ? Incomplete bladder emptying   ? [place]   ? Nocturia   ? [place]ipheral arterial disease [place]   ? Prostate cancer ([facility]  ? Cryotherapy by Dr. HErnst Spell  PSA 5.54 in September at [facility]  ? Renal stones   ? ? ?Surgical History: ?Past Surgical History:  ?Procedure Laterality Date  ? AORTOGRAM    ? PERITONEAL CATHETER INSERTION    ? PROSTATE SURGERY    ? TRANSLUMINAL ANGIOPLASTY    ? ? ?Home Medications:  ?Allergies as of 04/24/2022   ? ?   Reactions  ? Aspirin Nausea And Vomiting  ? Sulfamethoxazole-trimethoprim   ? Other reaction(s): Blood Disorder ?Coagulopathy  ? ?  ? ?  ?Medication List  ?  ? ?  ? Accurate as of April 24, 2022 11:59 PM. If you have any questions, ask your nurse or doctor.  ?  ?  ? ?  ? ?Aspirin Low Dose 81 MG EC  tablet ?Generic drug: aspirin ?Take 81 mg by mouth daily. ?  ?atorvastatin 40 MG tablet ?Commonly known as: LIPITOR ?Take 40 mg by mouth daily. ?  ?clopidogrel 75 MG tablet ?Commonly known as: PLAVIX ?Take by mouth. ?  ?ferrous sulfate 325 (65 FE) MG EC tablet ?Take 1 tablet by mouth daily. ?  ?finasteride 5 MG tablet ?Commonly known as: PROSCAR ?Take 1 tablet (5 mg total) by mouth daily. ?  ?latanoprost 0.005 % ophthalmic solution ?Commonly known as: XALATAN ?  ?lisinopril 40 MG tablet ?Commonly known as: ZESTRIL ?Take 40 mg by mouth daily. ?What changed: Another medication with the same name was removed. Continue taking this medication, and follow the directions you see here. ?Changed by: AHollice Espy MD ?  ?metoprolol tartrate 25 MG tablet ?Commonly known as: LOPRESSOR ?Take 25 mg by mouth 2 (two) times daily. ?  ?tamsulosin 0.4 MG Caps capsule ?Commonly known as: FLOMAX ?Take 1 capsule (0.4 mg total) by mouth daily. At bedtime ?  ? ?  ? ? ?Allergies:  ?Allergies  ?Allergen Reactions  ? Aspirin Nausea And Vomiting  ? Sulfamethoxazole-Trimethoprim   ?  Other reaction(s): Blood Disorder ?Coagulopathy  ? ? ?Family History: ?Family History  ?Problem Relation Age of Onset  ? Heart disease  Brother   ? Kidney Stones Sister   ? Heart attack Sister   ? Heart attack Brother   ? Kidney disease Neg Hx   ? Prostate cancer Neg Hx   ? ? ?Social History:  reports that he has quit smoking. His smoking use included cigarettes. He has never used smokeless tobacco. He reports that he does not drink alcohol and does not use drugs. ? ? ?Physical Exam: ?BP (!) 150/80   Pulse 76   Ht '5\' 9"'$  (1.753 m)   BMI 22.15 kg/m?   ?Constitutional:  Alert and oriented, No acute distress. ?HEENT: [place] AT, moist mucus membranes.  Trachea midline, no masses. ?Cardiovascular: No clubbing, cyanosis, or edema. ?Respiratory: Normal respiratory effort, no increased work of breathing. ?Skin: No rashes, bruises or suspicious lesions. ?Neurologic: Grossly  intact, no focal deficits, moving all 4 extremities. ?Psychiatric: Normal mood and affect. ? ?Laboratory Data: ? ?Lab Results  ?Component Value Date  ? CREATININE 1.92 (H) 11/26/2021  ? ? ? ?Pertinent Imaging: ?Results for orders placed or performed in visit on 04/24/22  ?PSA  ?Result Value Ref Range  ? Prostate Specific Ag, Serum 1.6 0.0 - 4.0 ng/mL  ?Bladder Scan (Post Void Residual) in office  ?Result Value Ref Range  ? Scan Result 204   ? ? ?Assessment & Plan:  ?Benign prostatic hyperplasia with incomplete bladder emptying ?- Stably elevated PVR today.  Okay to continue Flomax and finasteride ?- We will plan to recheck his PVR next year, retention symptoms reviewed ?- Bladder Scan (Post Void Residual) in office. ? ?Chronic bacteruria  ?- Asymptomatic, continue conservative management. Will treat only if symptomatic.  ? ?History of elevated PSA ?- PSA; pending  ? ?Return in about 1 year (around 04/25/2023). ? ?Conley Rolls as a scribe for Hollice Espy, MD.,have documented all relevant documentation on the behalf of Hollice Espy, MD,as directed by  Hollice Espy, MD while in the presence of Hollice Espy, MD. ? ?I have reviewed the above documentation for accuracy and completeness, and I agree with the above.  ? ?Hollice Espy, MD ? ? ?[facility] ?[address], [address] ?[place], [place] [address] ?[phone] ?

## 2022-04-24 ENCOUNTER — Ambulatory Visit (INDEPENDENT_AMBULATORY_CARE_PROVIDER_SITE_OTHER): Payer: Medicare HMO | Admitting: Urology

## 2022-04-24 ENCOUNTER — Encounter: Payer: Self-pay | Admitting: Urology

## 2022-04-24 VITALS — BP 150/80 | HR 76 | Ht 69.0 in

## 2022-04-24 DIAGNOSIS — M6281 Muscle weakness (generalized): Secondary | ICD-10-CM | POA: Diagnosis not present

## 2022-04-24 DIAGNOSIS — R1310 Dysphagia, unspecified: Secondary | ICD-10-CM | POA: Diagnosis not present

## 2022-04-24 DIAGNOSIS — N401 Enlarged prostate with lower urinary tract symptoms: Secondary | ICD-10-CM | POA: Diagnosis not present

## 2022-04-24 DIAGNOSIS — Z8546 Personal history of malignant neoplasm of prostate: Secondary | ICD-10-CM

## 2022-04-24 DIAGNOSIS — R3914 Feeling of incomplete bladder emptying: Secondary | ICD-10-CM

## 2022-04-24 LAB — BLADDER SCAN AMB NON-IMAGING: Scan Result: 204

## 2022-04-25 DIAGNOSIS — R1310 Dysphagia, unspecified: Secondary | ICD-10-CM | POA: Diagnosis not present

## 2022-04-25 DIAGNOSIS — M6281 Muscle weakness (generalized): Secondary | ICD-10-CM | POA: Diagnosis not present

## 2022-04-25 LAB — PSA: Prostate Specific Ag, Serum: 1.6 ng/mL (ref 0.0–4.0)

## 2022-04-26 DIAGNOSIS — M6281 Muscle weakness (generalized): Secondary | ICD-10-CM | POA: Diagnosis not present

## 2022-04-26 DIAGNOSIS — R1310 Dysphagia, unspecified: Secondary | ICD-10-CM | POA: Diagnosis not present

## 2022-04-26 DIAGNOSIS — I10 Essential (primary) hypertension: Secondary | ICD-10-CM | POA: Diagnosis not present

## 2022-04-27 DIAGNOSIS — M6281 Muscle weakness (generalized): Secondary | ICD-10-CM | POA: Diagnosis not present

## 2022-04-27 DIAGNOSIS — R1310 Dysphagia, unspecified: Secondary | ICD-10-CM | POA: Diagnosis not present

## 2022-04-28 DIAGNOSIS — M6281 Muscle weakness (generalized): Secondary | ICD-10-CM | POA: Diagnosis not present

## 2022-04-28 DIAGNOSIS — R1310 Dysphagia, unspecified: Secondary | ICD-10-CM | POA: Diagnosis not present

## 2022-04-29 DIAGNOSIS — R1310 Dysphagia, unspecified: Secondary | ICD-10-CM | POA: Diagnosis not present

## 2022-04-29 DIAGNOSIS — M6281 Muscle weakness (generalized): Secondary | ICD-10-CM | POA: Diagnosis not present

## 2022-04-30 DIAGNOSIS — R1310 Dysphagia, unspecified: Secondary | ICD-10-CM | POA: Diagnosis not present

## 2022-04-30 DIAGNOSIS — D508 Other iron deficiency anemias: Secondary | ICD-10-CM | POA: Diagnosis not present

## 2022-04-30 DIAGNOSIS — I639 Cerebral infarction, unspecified: Secondary | ICD-10-CM | POA: Diagnosis not present

## 2022-04-30 DIAGNOSIS — I10 Essential (primary) hypertension: Secondary | ICD-10-CM | POA: Diagnosis not present

## 2022-04-30 DIAGNOSIS — N400 Enlarged prostate without lower urinary tract symptoms: Secondary | ICD-10-CM | POA: Diagnosis not present

## 2022-04-30 DIAGNOSIS — M6281 Muscle weakness (generalized): Secondary | ICD-10-CM | POA: Diagnosis not present

## 2022-04-30 DIAGNOSIS — E785 Hyperlipidemia, unspecified: Secondary | ICD-10-CM | POA: Diagnosis not present

## 2022-05-01 DIAGNOSIS — R1310 Dysphagia, unspecified: Secondary | ICD-10-CM | POA: Diagnosis not present

## 2022-05-01 DIAGNOSIS — M6281 Muscle weakness (generalized): Secondary | ICD-10-CM | POA: Diagnosis not present

## 2022-05-02 DIAGNOSIS — M6281 Muscle weakness (generalized): Secondary | ICD-10-CM | POA: Diagnosis not present

## 2022-05-02 DIAGNOSIS — R1310 Dysphagia, unspecified: Secondary | ICD-10-CM | POA: Diagnosis not present

## 2022-05-03 DIAGNOSIS — M6281 Muscle weakness (generalized): Secondary | ICD-10-CM | POA: Diagnosis not present

## 2022-05-03 DIAGNOSIS — R1310 Dysphagia, unspecified: Secondary | ICD-10-CM | POA: Diagnosis not present

## 2022-05-04 DIAGNOSIS — M6281 Muscle weakness (generalized): Secondary | ICD-10-CM | POA: Diagnosis not present

## 2022-05-04 DIAGNOSIS — R1310 Dysphagia, unspecified: Secondary | ICD-10-CM | POA: Diagnosis not present

## 2022-05-05 DIAGNOSIS — R1310 Dysphagia, unspecified: Secondary | ICD-10-CM | POA: Diagnosis not present

## 2022-05-05 DIAGNOSIS — M6281 Muscle weakness (generalized): Secondary | ICD-10-CM | POA: Diagnosis not present

## 2022-05-06 DIAGNOSIS — R1310 Dysphagia, unspecified: Secondary | ICD-10-CM | POA: Diagnosis not present

## 2022-05-06 DIAGNOSIS — M6281 Muscle weakness (generalized): Secondary | ICD-10-CM | POA: Diagnosis not present

## 2022-05-07 DIAGNOSIS — M6281 Muscle weakness (generalized): Secondary | ICD-10-CM | POA: Diagnosis not present

## 2022-05-07 DIAGNOSIS — R1310 Dysphagia, unspecified: Secondary | ICD-10-CM | POA: Diagnosis not present

## 2022-05-08 DIAGNOSIS — M6281 Muscle weakness (generalized): Secondary | ICD-10-CM | POA: Diagnosis not present

## 2022-05-08 DIAGNOSIS — R1310 Dysphagia, unspecified: Secondary | ICD-10-CM | POA: Diagnosis not present

## 2022-05-09 DIAGNOSIS — R1310 Dysphagia, unspecified: Secondary | ICD-10-CM | POA: Diagnosis not present

## 2022-05-09 DIAGNOSIS — M6281 Muscle weakness (generalized): Secondary | ICD-10-CM | POA: Diagnosis not present

## 2022-05-10 DIAGNOSIS — R1310 Dysphagia, unspecified: Secondary | ICD-10-CM | POA: Diagnosis not present

## 2022-05-10 DIAGNOSIS — M6281 Muscle weakness (generalized): Secondary | ICD-10-CM | POA: Diagnosis not present

## 2022-05-11 DIAGNOSIS — M6281 Muscle weakness (generalized): Secondary | ICD-10-CM | POA: Diagnosis not present

## 2022-05-11 DIAGNOSIS — E038 Other specified hypothyroidism: Secondary | ICD-10-CM | POA: Diagnosis not present

## 2022-05-11 DIAGNOSIS — R1310 Dysphagia, unspecified: Secondary | ICD-10-CM | POA: Diagnosis not present

## 2022-05-11 DIAGNOSIS — I10 Essential (primary) hypertension: Secondary | ICD-10-CM | POA: Diagnosis not present

## 2022-05-11 DIAGNOSIS — D518 Other vitamin B12 deficiency anemias: Secondary | ICD-10-CM | POA: Diagnosis not present

## 2022-05-11 DIAGNOSIS — I639 Cerebral infarction, unspecified: Secondary | ICD-10-CM | POA: Diagnosis not present

## 2022-05-11 DIAGNOSIS — E785 Hyperlipidemia, unspecified: Secondary | ICD-10-CM | POA: Diagnosis not present

## 2022-05-11 DIAGNOSIS — E559 Vitamin D deficiency, unspecified: Secondary | ICD-10-CM | POA: Diagnosis not present

## 2022-05-11 DIAGNOSIS — E119 Type 2 diabetes mellitus without complications: Secondary | ICD-10-CM | POA: Diagnosis not present

## 2022-05-12 DIAGNOSIS — M6281 Muscle weakness (generalized): Secondary | ICD-10-CM | POA: Diagnosis not present

## 2022-05-12 DIAGNOSIS — R1310 Dysphagia, unspecified: Secondary | ICD-10-CM | POA: Diagnosis not present

## 2022-05-13 DIAGNOSIS — R1310 Dysphagia, unspecified: Secondary | ICD-10-CM | POA: Diagnosis not present

## 2022-05-13 DIAGNOSIS — M6281 Muscle weakness (generalized): Secondary | ICD-10-CM | POA: Diagnosis not present

## 2022-05-14 DIAGNOSIS — R1310 Dysphagia, unspecified: Secondary | ICD-10-CM | POA: Diagnosis not present

## 2022-05-14 DIAGNOSIS — M6281 Muscle weakness (generalized): Secondary | ICD-10-CM | POA: Diagnosis not present

## 2022-05-15 DIAGNOSIS — R1310 Dysphagia, unspecified: Secondary | ICD-10-CM | POA: Diagnosis not present

## 2022-05-15 DIAGNOSIS — M6281 Muscle weakness (generalized): Secondary | ICD-10-CM | POA: Diagnosis not present

## 2022-05-16 DIAGNOSIS — M6281 Muscle weakness (generalized): Secondary | ICD-10-CM | POA: Diagnosis not present

## 2022-05-16 DIAGNOSIS — R1310 Dysphagia, unspecified: Secondary | ICD-10-CM | POA: Diagnosis not present

## 2022-05-17 DIAGNOSIS — M6281 Muscle weakness (generalized): Secondary | ICD-10-CM | POA: Diagnosis not present

## 2022-05-17 DIAGNOSIS — R1310 Dysphagia, unspecified: Secondary | ICD-10-CM | POA: Diagnosis not present

## 2022-05-18 DIAGNOSIS — M6281 Muscle weakness (generalized): Secondary | ICD-10-CM | POA: Diagnosis not present

## 2022-05-18 DIAGNOSIS — R1310 Dysphagia, unspecified: Secondary | ICD-10-CM | POA: Diagnosis not present

## 2022-05-19 DIAGNOSIS — R1310 Dysphagia, unspecified: Secondary | ICD-10-CM | POA: Diagnosis not present

## 2022-05-19 DIAGNOSIS — M6281 Muscle weakness (generalized): Secondary | ICD-10-CM | POA: Diagnosis not present

## 2022-05-20 DIAGNOSIS — R1310 Dysphagia, unspecified: Secondary | ICD-10-CM | POA: Diagnosis not present

## 2022-05-20 DIAGNOSIS — M6281 Muscle weakness (generalized): Secondary | ICD-10-CM | POA: Diagnosis not present

## 2022-05-21 DIAGNOSIS — R1310 Dysphagia, unspecified: Secondary | ICD-10-CM | POA: Diagnosis not present

## 2022-05-21 DIAGNOSIS — M6281 Muscle weakness (generalized): Secondary | ICD-10-CM | POA: Diagnosis not present

## 2022-05-21 DIAGNOSIS — R32 Unspecified urinary incontinence: Secondary | ICD-10-CM | POA: Diagnosis not present

## 2022-05-21 DIAGNOSIS — R159 Full incontinence of feces: Secondary | ICD-10-CM | POA: Diagnosis not present

## 2022-05-22 DIAGNOSIS — R1310 Dysphagia, unspecified: Secondary | ICD-10-CM | POA: Diagnosis not present

## 2022-05-22 DIAGNOSIS — M6281 Muscle weakness (generalized): Secondary | ICD-10-CM | POA: Diagnosis not present

## 2022-05-23 DIAGNOSIS — R1310 Dysphagia, unspecified: Secondary | ICD-10-CM | POA: Diagnosis not present

## 2022-05-23 DIAGNOSIS — M6281 Muscle weakness (generalized): Secondary | ICD-10-CM | POA: Diagnosis not present

## 2022-05-24 DIAGNOSIS — M6281 Muscle weakness (generalized): Secondary | ICD-10-CM | POA: Diagnosis not present

## 2022-05-24 DIAGNOSIS — R1310 Dysphagia, unspecified: Secondary | ICD-10-CM | POA: Diagnosis not present

## 2022-05-25 DIAGNOSIS — R1310 Dysphagia, unspecified: Secondary | ICD-10-CM | POA: Diagnosis not present

## 2022-05-25 DIAGNOSIS — M6281 Muscle weakness (generalized): Secondary | ICD-10-CM | POA: Diagnosis not present

## 2022-05-26 DIAGNOSIS — M6281 Muscle weakness (generalized): Secondary | ICD-10-CM | POA: Diagnosis not present

## 2022-05-26 DIAGNOSIS — I10 Essential (primary) hypertension: Secondary | ICD-10-CM | POA: Diagnosis not present

## 2022-05-26 DIAGNOSIS — R1310 Dysphagia, unspecified: Secondary | ICD-10-CM | POA: Diagnosis not present

## 2022-05-27 DIAGNOSIS — R1310 Dysphagia, unspecified: Secondary | ICD-10-CM | POA: Diagnosis not present

## 2022-05-27 DIAGNOSIS — M6281 Muscle weakness (generalized): Secondary | ICD-10-CM | POA: Diagnosis not present

## 2022-05-28 DIAGNOSIS — R1310 Dysphagia, unspecified: Secondary | ICD-10-CM | POA: Diagnosis not present

## 2022-05-28 DIAGNOSIS — M6281 Muscle weakness (generalized): Secondary | ICD-10-CM | POA: Diagnosis not present

## 2022-05-29 DIAGNOSIS — R1310 Dysphagia, unspecified: Secondary | ICD-10-CM | POA: Diagnosis not present

## 2022-05-29 DIAGNOSIS — M6281 Muscle weakness (generalized): Secondary | ICD-10-CM | POA: Diagnosis not present

## 2022-05-30 DIAGNOSIS — R1310 Dysphagia, unspecified: Secondary | ICD-10-CM | POA: Diagnosis not present

## 2022-05-30 DIAGNOSIS — M6281 Muscle weakness (generalized): Secondary | ICD-10-CM | POA: Diagnosis not present

## 2022-05-31 DIAGNOSIS — R1310 Dysphagia, unspecified: Secondary | ICD-10-CM | POA: Diagnosis not present

## 2022-05-31 DIAGNOSIS — M6281 Muscle weakness (generalized): Secondary | ICD-10-CM | POA: Diagnosis not present

## 2022-06-01 DIAGNOSIS — M6281 Muscle weakness (generalized): Secondary | ICD-10-CM | POA: Diagnosis not present

## 2022-06-01 DIAGNOSIS — R1310 Dysphagia, unspecified: Secondary | ICD-10-CM | POA: Diagnosis not present

## 2022-06-02 DIAGNOSIS — R1310 Dysphagia, unspecified: Secondary | ICD-10-CM | POA: Diagnosis not present

## 2022-06-02 DIAGNOSIS — M6281 Muscle weakness (generalized): Secondary | ICD-10-CM | POA: Diagnosis not present

## 2022-06-03 DIAGNOSIS — R1310 Dysphagia, unspecified: Secondary | ICD-10-CM | POA: Diagnosis not present

## 2022-06-03 DIAGNOSIS — M6281 Muscle weakness (generalized): Secondary | ICD-10-CM | POA: Diagnosis not present

## 2022-06-04 DIAGNOSIS — M6281 Muscle weakness (generalized): Secondary | ICD-10-CM | POA: Diagnosis not present

## 2022-06-04 DIAGNOSIS — R1310 Dysphagia, unspecified: Secondary | ICD-10-CM | POA: Diagnosis not present

## 2022-06-05 DIAGNOSIS — R1310 Dysphagia, unspecified: Secondary | ICD-10-CM | POA: Diagnosis not present

## 2022-06-05 DIAGNOSIS — M6281 Muscle weakness (generalized): Secondary | ICD-10-CM | POA: Diagnosis not present

## 2022-06-06 DIAGNOSIS — M6281 Muscle weakness (generalized): Secondary | ICD-10-CM | POA: Diagnosis not present

## 2022-06-06 DIAGNOSIS — R1310 Dysphagia, unspecified: Secondary | ICD-10-CM | POA: Diagnosis not present

## 2022-06-07 DIAGNOSIS — R1310 Dysphagia, unspecified: Secondary | ICD-10-CM | POA: Diagnosis not present

## 2022-06-07 DIAGNOSIS — M6281 Muscle weakness (generalized): Secondary | ICD-10-CM | POA: Diagnosis not present

## 2022-06-08 DIAGNOSIS — M6281 Muscle weakness (generalized): Secondary | ICD-10-CM | POA: Diagnosis not present

## 2022-06-08 DIAGNOSIS — R1310 Dysphagia, unspecified: Secondary | ICD-10-CM | POA: Diagnosis not present

## 2022-06-09 DIAGNOSIS — M6281 Muscle weakness (generalized): Secondary | ICD-10-CM | POA: Diagnosis not present

## 2022-06-09 DIAGNOSIS — R1310 Dysphagia, unspecified: Secondary | ICD-10-CM | POA: Diagnosis not present

## 2022-06-10 DIAGNOSIS — R1310 Dysphagia, unspecified: Secondary | ICD-10-CM | POA: Diagnosis not present

## 2022-06-10 DIAGNOSIS — M6281 Muscle weakness (generalized): Secondary | ICD-10-CM | POA: Diagnosis not present

## 2022-06-11 DIAGNOSIS — N400 Enlarged prostate without lower urinary tract symptoms: Secondary | ICD-10-CM | POA: Diagnosis not present

## 2022-06-11 DIAGNOSIS — E782 Mixed hyperlipidemia: Secondary | ICD-10-CM | POA: Diagnosis not present

## 2022-06-11 DIAGNOSIS — E785 Hyperlipidemia, unspecified: Secondary | ICD-10-CM | POA: Diagnosis not present

## 2022-06-11 DIAGNOSIS — R1310 Dysphagia, unspecified: Secondary | ICD-10-CM | POA: Diagnosis not present

## 2022-06-11 DIAGNOSIS — D518 Other vitamin B12 deficiency anemias: Secondary | ICD-10-CM | POA: Diagnosis not present

## 2022-06-11 DIAGNOSIS — M6281 Muscle weakness (generalized): Secondary | ICD-10-CM | POA: Diagnosis not present

## 2022-06-11 DIAGNOSIS — H40003 Preglaucoma, unspecified, bilateral: Secondary | ICD-10-CM | POA: Diagnosis not present

## 2022-06-11 DIAGNOSIS — Z961 Presence of intraocular lens: Secondary | ICD-10-CM | POA: Diagnosis not present

## 2022-06-11 DIAGNOSIS — E559 Vitamin D deficiency, unspecified: Secondary | ICD-10-CM | POA: Diagnosis not present

## 2022-06-11 DIAGNOSIS — I10 Essential (primary) hypertension: Secondary | ICD-10-CM | POA: Diagnosis not present

## 2022-06-11 DIAGNOSIS — E038 Other specified hypothyroidism: Secondary | ICD-10-CM | POA: Diagnosis not present

## 2022-06-11 DIAGNOSIS — H2512 Age-related nuclear cataract, left eye: Secondary | ICD-10-CM | POA: Diagnosis not present

## 2022-06-11 DIAGNOSIS — H47391 Other disorders of optic disc, right eye: Secondary | ICD-10-CM | POA: Diagnosis not present

## 2022-06-11 DIAGNOSIS — E119 Type 2 diabetes mellitus without complications: Secondary | ICD-10-CM | POA: Diagnosis not present

## 2022-06-11 DIAGNOSIS — D508 Other iron deficiency anemias: Secondary | ICD-10-CM | POA: Diagnosis not present

## 2022-06-11 DIAGNOSIS — I639 Cerebral infarction, unspecified: Secondary | ICD-10-CM | POA: Diagnosis not present

## 2022-06-12 DIAGNOSIS — M6281 Muscle weakness (generalized): Secondary | ICD-10-CM | POA: Diagnosis not present

## 2022-06-12 DIAGNOSIS — R1310 Dysphagia, unspecified: Secondary | ICD-10-CM | POA: Diagnosis not present

## 2022-06-13 DIAGNOSIS — R1310 Dysphagia, unspecified: Secondary | ICD-10-CM | POA: Diagnosis not present

## 2022-06-13 DIAGNOSIS — M6281 Muscle weakness (generalized): Secondary | ICD-10-CM | POA: Diagnosis not present

## 2022-06-14 DIAGNOSIS — M6281 Muscle weakness (generalized): Secondary | ICD-10-CM | POA: Diagnosis not present

## 2022-06-14 DIAGNOSIS — R1310 Dysphagia, unspecified: Secondary | ICD-10-CM | POA: Diagnosis not present

## 2022-06-15 DIAGNOSIS — R1310 Dysphagia, unspecified: Secondary | ICD-10-CM | POA: Diagnosis not present

## 2022-06-15 DIAGNOSIS — M6281 Muscle weakness (generalized): Secondary | ICD-10-CM | POA: Diagnosis not present

## 2022-06-16 ENCOUNTER — Encounter: Payer: Self-pay | Admitting: Medical Oncology

## 2022-06-16 ENCOUNTER — Emergency Department: Payer: Medicare HMO

## 2022-06-16 ENCOUNTER — Emergency Department
Admission: EM | Admit: 2022-06-16 | Discharge: 2022-06-16 | Disposition: A | Discharge status: Home / Self Care | Payer: Medicare HMO | Attending: Emergency Medicine | Admitting: Emergency Medicine

## 2022-06-16 DIAGNOSIS — M25552 Pain in left hip: Secondary | ICD-10-CM | POA: Diagnosis not present

## 2022-06-16 DIAGNOSIS — Z7401 Bed confinement status: Secondary | ICD-10-CM | POA: Diagnosis not present

## 2022-06-16 DIAGNOSIS — M6281 Muscle weakness (generalized): Secondary | ICD-10-CM | POA: Diagnosis not present

## 2022-06-16 DIAGNOSIS — Z743 Need for continuous supervision: Secondary | ICD-10-CM | POA: Diagnosis not present

## 2022-06-16 DIAGNOSIS — R1310 Dysphagia, unspecified: Secondary | ICD-10-CM | POA: Diagnosis not present

## 2022-06-16 DIAGNOSIS — M545 Low back pain, unspecified: Secondary | ICD-10-CM | POA: Diagnosis not present

## 2022-06-16 DIAGNOSIS — R0902 Hypoxemia: Secondary | ICD-10-CM | POA: Diagnosis not present

## 2022-06-16 DIAGNOSIS — R52 Pain, unspecified: Secondary | ICD-10-CM | POA: Diagnosis not present

## 2022-06-16 DIAGNOSIS — M79605 Pain in left leg: Secondary | ICD-10-CM | POA: Diagnosis not present

## 2022-06-16 DIAGNOSIS — I10 Essential (primary) hypertension: Secondary | ICD-10-CM | POA: Diagnosis not present

## 2022-06-16 DIAGNOSIS — R29898 Other symptoms and signs involving the musculoskeletal system: Secondary | ICD-10-CM | POA: Diagnosis not present

## 2022-06-16 DIAGNOSIS — W19XXXA Unspecified fall, initial encounter: Secondary | ICD-10-CM | POA: Diagnosis not present

## 2022-06-16 MED ORDER — PREDNISONE 50 MG PO TABS: 50.0000 mg | ORAL_TABLET | Freq: Every day | ORAL | 0 refills | Status: DC

## 2022-06-16 NOTE — ED Provider Notes (Signed)
[facility] Provider Note  Patient Contact: 2:55 PM (approximate)   History   Hip Pain   HPI  Jon Smith is a 88 y.o. male who presents the emergency department complaining of lateral hip pain.  Patient states that this morning when he got up he noticed that his hip was hurting.  He has had no trauma.  He is not ambulatory at baseline, states that he uses a wheelchair to maneuver.  Patient states that at rest he has no pain.  If he gets "bumped" he has some pain along the lateral hip.  No back pain.  No radicular symptoms.  Again no trauma.  Patient has no urinary or GI complaints.     Physical Exam   Triage Vital Signs: ED Triage Vitals  Enc Vitals Group     BP 06/16/22 1423 (!) 162/72     Pulse Rate 06/16/22 1423 71     Resp 06/16/22 1423 18     Temp 06/16/22 1423 97.8 F (36.6 C)     Temp Source 06/16/22 1423 Oral     SpO2 06/16/22 1423 99 %     Weight 06/16/22 1422 149 lb 14.6 oz (68 kg)     Height 06/16/22 1422 '5\' 9"'$  (1.753 m)     Head Circumference --      Peak Flow --      Pain Score 06/16/22 1422 8     Pain Loc --      Pain Edu? --      Excl. in [address]? --     Most recent vital signs: Vitals:   06/16/22 1423  BP: (!) 162/72  Pulse: 71  Resp: 18  Temp: 97.8 F (36.6 C)  SpO2: 99%     General: Alert and in no acute distress.   Cardiovascular:  Good peripheral perfusion Respiratory: Normal respiratory effort without tachypnea or retractions. Lungs CTAB. Good air entry to the bases with no decreased or absent breath sounds Gastrointestinal: Bowel sounds 4 quadrants. Soft and nontender to palpation. No guarding or rigidity. No palpable masses. No distention. No CVA tenderness. Musculoskeletal: Full range of motion to all extremities.  Patient with no visible signs of trauma.  No visible signs of trauma to the lumbar spine.  Palpation reveals no tenderness.  No palpable abnormality or step-off.  No shortening or rotation of the left  lower extremity.  Patient with no significant tenderness to physical exam.  Pulses and sensation intact distally. Neurologic:  No gross focal neurologic deficits are appreciated.  Skin:   No rash noted Other:   ED Results / Procedures / Treatments   Labs (all labs ordered are listed, but only abnormal results are displayed) Labs Reviewed - No data to display   EKG     RADIOLOGY  I personally viewed, evaluated, and interpreted these images as part of my medical decision making, as well as reviewing the written report by the radiologist.  ED Provider Interpretation: Degenerative changes on lumbar spine.  No acute abnormalities to the spine or left hip.  DG Lumbar Spine 2-3 Views  Result Date: 06/16/2022 CLINICAL DATA:  Acute low back pain.  No known injury. EXAM: LUMBAR SPINE - 3 VIEW COMPARISON:  03/17/2021 abdominal/pelvic CT FINDINGS: 5 non rib-bearing lumbar type vertebra are noted in normal alignment. There is no evidence of acute fracture or subluxation. Multilevel degenerative disc disease/spondylosis is unchanged, moderate at L4-5 and moderate to severe at L5-S1. Facet arthropathy in the LOWER lumbar spine  again noted. No focal bony lesions are identified. IMPRESSION: No evidence of acute abnormality. Multilevel degenerative changes again identified. Electronically Signed   By: Margarette Canada M.D.   On: 06/16/2022 16:33   DG Hip Unilat W or Wo Pelvis 2-3 Views Left  Result Date: 06/16/2022 CLINICAL DATA:  Acute LEFT hip pain for 1 day.  No known injury. EXAM: DG HIP (WITH OR WITHOUT PELVIS) 2-3V LEFT COMPARISON:  03/17/2021 abdominal and pelvic CT. FINDINGS: There is no evidence of acute fracture or dislocation. Moderate to severe joint space narrowing/osteophytosis within the LEFT hip noted with juxta-articular sclerosis. Mild to moderate degenerative changes in the RIGHT hip noted with mild joint space narrowing and juxta-articular sclerosis. No focal bony lesions are identified.  Degenerative changes in the LOWER lumbar spine are identified. IMPRESSION: 1. No evidence of acute abnormality. 2. Degenerative changes within both hips, moderate to severe on the LEFT and mild to moderate on the RIGHT. Electronically Signed   By: Margarette Canada M.D.   On: 06/16/2022 16:29    PROCEDURES:  Critical Care performed: No  Procedures   MEDICATIONS ORDERED IN ED: Medications - No data to display   IMPRESSION / MDM / [place] / ED COURSE  I reviewed the triage vital signs and the nursing notes.                              Differential diagnosis includes, but is not limited to, hip fracture, hip strain, sciatica, bursitis  Patient's presentation is most consistent with acute illness / injury with system symptoms.   Patient's diagnosis is consistent with hip pain.  Patient presents to the ED complaining of hip pain if he is "bumped".  Patient has no pain at baseline.  Has had no trauma.  Patient states that he woke up and when he was being assisted to the restroom he was "bumped" and felt pain in the hip.  There was no tenderness on exam.  Still has range of motion to the hip.  He is nonambulatory at baseline and uses a wheelchair for mobility.  Imaging revealed no acute fracture or dislocation.  At this time we will treat with a short course of steroid for inflammation control.  No indication for further work-up at this time.. Patient is given ED precautions to return to the ED for any worsening or new symptoms.        FINAL CLINICAL IMPRESSION(S) / ED DIAGNOSES   Final diagnoses:  Left hip pain     Rx / DC Orders   ED Discharge Orders          Ordered    predniSONE (DELTASONE) 50 MG tablet  Daily with breakfast        06/16/22 1708             Note:  This document was prepared using Dragon voice recognition software and may include unintentional dictation errors.   Brynda Peon 06/16/22 1710    Rada Hay, MD 06/16/22  1754

## 2022-06-16 NOTE — ED Triage Notes (Signed)
Pt from the [facility] via [facility] with reports of left hip/thigh pain that began spontaneously this am. Denies injury.

## 2022-06-16 NOTE — ED Notes (Signed)
PT assisted x3 to stretcher, changed brief and cleaned patient. PT heavy assist unable to stand and pivot.

## 2022-06-16 NOTE — ED Notes (Signed)
Left message with son, unable to retrieve patient.

## 2022-06-17 DIAGNOSIS — M6281 Muscle weakness (generalized): Secondary | ICD-10-CM | POA: Diagnosis not present

## 2022-06-17 DIAGNOSIS — R1310 Dysphagia, unspecified: Secondary | ICD-10-CM | POA: Diagnosis not present

## 2022-06-18 DIAGNOSIS — Z79899 Other long term (current) drug therapy: Secondary | ICD-10-CM | POA: Diagnosis not present

## 2022-06-18 DIAGNOSIS — E559 Vitamin D deficiency, unspecified: Secondary | ICD-10-CM | POA: Diagnosis not present

## 2022-06-18 DIAGNOSIS — E038 Other specified hypothyroidism: Secondary | ICD-10-CM | POA: Diagnosis not present

## 2022-06-18 DIAGNOSIS — M6281 Muscle weakness (generalized): Secondary | ICD-10-CM | POA: Diagnosis not present

## 2022-06-18 DIAGNOSIS — R1310 Dysphagia, unspecified: Secondary | ICD-10-CM | POA: Diagnosis not present

## 2022-06-18 DIAGNOSIS — E782 Mixed hyperlipidemia: Secondary | ICD-10-CM | POA: Diagnosis not present

## 2022-06-18 DIAGNOSIS — D518 Other vitamin B12 deficiency anemias: Secondary | ICD-10-CM | POA: Diagnosis not present

## 2022-06-19 DIAGNOSIS — R1310 Dysphagia, unspecified: Secondary | ICD-10-CM | POA: Diagnosis not present

## 2022-06-19 DIAGNOSIS — M6281 Muscle weakness (generalized): Secondary | ICD-10-CM | POA: Diagnosis not present

## 2022-06-20 DIAGNOSIS — M7070 Other bursitis of hip, unspecified hip: Secondary | ICD-10-CM | POA: Diagnosis not present

## 2022-06-20 DIAGNOSIS — N179 Acute kidney failure, unspecified: Secondary | ICD-10-CM | POA: Diagnosis not present

## 2022-06-20 DIAGNOSIS — D508 Other iron deficiency anemias: Secondary | ICD-10-CM | POA: Diagnosis not present

## 2022-06-20 DIAGNOSIS — I639 Cerebral infarction, unspecified: Secondary | ICD-10-CM | POA: Diagnosis not present

## 2022-06-20 DIAGNOSIS — R1310 Dysphagia, unspecified: Secondary | ICD-10-CM | POA: Diagnosis not present

## 2022-06-20 DIAGNOSIS — R32 Unspecified urinary incontinence: Secondary | ICD-10-CM | POA: Diagnosis not present

## 2022-06-20 DIAGNOSIS — M6281 Muscle weakness (generalized): Secondary | ICD-10-CM | POA: Diagnosis not present

## 2022-06-20 DIAGNOSIS — N189 Chronic kidney disease, unspecified: Secondary | ICD-10-CM | POA: Diagnosis not present

## 2022-06-20 DIAGNOSIS — R159 Full incontinence of feces: Secondary | ICD-10-CM | POA: Diagnosis not present

## 2022-06-20 DIAGNOSIS — I10 Essential (primary) hypertension: Secondary | ICD-10-CM | POA: Diagnosis not present

## 2022-06-21 DIAGNOSIS — M6281 Muscle weakness (generalized): Secondary | ICD-10-CM | POA: Diagnosis not present

## 2022-06-21 DIAGNOSIS — R1310 Dysphagia, unspecified: Secondary | ICD-10-CM | POA: Diagnosis not present

## 2022-06-22 DIAGNOSIS — R1310 Dysphagia, unspecified: Secondary | ICD-10-CM | POA: Diagnosis not present

## 2022-06-22 DIAGNOSIS — M6281 Muscle weakness (generalized): Secondary | ICD-10-CM | POA: Diagnosis not present

## 2022-06-23 DIAGNOSIS — R1310 Dysphagia, unspecified: Secondary | ICD-10-CM | POA: Diagnosis not present

## 2022-06-23 DIAGNOSIS — M6281 Muscle weakness (generalized): Secondary | ICD-10-CM | POA: Diagnosis not present

## 2022-06-24 DIAGNOSIS — M6281 Muscle weakness (generalized): Secondary | ICD-10-CM | POA: Diagnosis not present

## 2022-06-24 DIAGNOSIS — B351 Tinea unguium: Secondary | ICD-10-CM | POA: Diagnosis not present

## 2022-06-24 DIAGNOSIS — L600 Ingrowing nail: Secondary | ICD-10-CM | POA: Diagnosis not present

## 2022-06-24 DIAGNOSIS — R1310 Dysphagia, unspecified: Secondary | ICD-10-CM | POA: Diagnosis not present

## 2022-06-24 DIAGNOSIS — M79672 Pain in left foot: Secondary | ICD-10-CM | POA: Diagnosis not present

## 2022-06-24 DIAGNOSIS — M79671 Pain in right foot: Secondary | ICD-10-CM | POA: Diagnosis not present

## 2022-06-25 DIAGNOSIS — D518 Other vitamin B12 deficiency anemias: Secondary | ICD-10-CM | POA: Diagnosis not present

## 2022-06-25 DIAGNOSIS — M6281 Muscle weakness (generalized): Secondary | ICD-10-CM | POA: Diagnosis not present

## 2022-06-25 DIAGNOSIS — Z79899 Other long term (current) drug therapy: Secondary | ICD-10-CM | POA: Diagnosis not present

## 2022-06-25 DIAGNOSIS — R1310 Dysphagia, unspecified: Secondary | ICD-10-CM | POA: Diagnosis not present

## 2022-06-25 DIAGNOSIS — E782 Mixed hyperlipidemia: Secondary | ICD-10-CM | POA: Diagnosis not present

## 2022-06-25 DIAGNOSIS — E119 Type 2 diabetes mellitus without complications: Secondary | ICD-10-CM | POA: Diagnosis not present

## 2022-06-26 DIAGNOSIS — M6281 Muscle weakness (generalized): Secondary | ICD-10-CM | POA: Diagnosis not present

## 2022-06-26 DIAGNOSIS — I10 Essential (primary) hypertension: Secondary | ICD-10-CM | POA: Diagnosis not present

## 2022-06-26 DIAGNOSIS — R1310 Dysphagia, unspecified: Secondary | ICD-10-CM | POA: Diagnosis not present

## 2022-06-27 DIAGNOSIS — M6281 Muscle weakness (generalized): Secondary | ICD-10-CM | POA: Diagnosis not present

## 2022-06-27 DIAGNOSIS — R1310 Dysphagia, unspecified: Secondary | ICD-10-CM | POA: Diagnosis not present

## 2022-06-28 DIAGNOSIS — M6281 Muscle weakness (generalized): Secondary | ICD-10-CM | POA: Diagnosis not present

## 2022-06-28 DIAGNOSIS — R1310 Dysphagia, unspecified: Secondary | ICD-10-CM | POA: Diagnosis not present

## 2022-06-29 DIAGNOSIS — M6281 Muscle weakness (generalized): Secondary | ICD-10-CM | POA: Diagnosis not present

## 2022-06-29 DIAGNOSIS — R1310 Dysphagia, unspecified: Secondary | ICD-10-CM | POA: Diagnosis not present

## 2022-06-30 DIAGNOSIS — M6281 Muscle weakness (generalized): Secondary | ICD-10-CM | POA: Diagnosis not present

## 2022-06-30 DIAGNOSIS — R1310 Dysphagia, unspecified: Secondary | ICD-10-CM | POA: Diagnosis not present

## 2022-07-01 DIAGNOSIS — R1310 Dysphagia, unspecified: Secondary | ICD-10-CM | POA: Diagnosis not present

## 2022-07-01 DIAGNOSIS — M6281 Muscle weakness (generalized): Secondary | ICD-10-CM | POA: Diagnosis not present

## 2022-07-02 DIAGNOSIS — M6281 Muscle weakness (generalized): Secondary | ICD-10-CM | POA: Diagnosis not present

## 2022-07-02 DIAGNOSIS — R1310 Dysphagia, unspecified: Secondary | ICD-10-CM | POA: Diagnosis not present

## 2022-07-03 DIAGNOSIS — R1310 Dysphagia, unspecified: Secondary | ICD-10-CM | POA: Diagnosis not present

## 2022-07-03 DIAGNOSIS — M6281 Muscle weakness (generalized): Secondary | ICD-10-CM | POA: Diagnosis not present

## 2022-07-04 DIAGNOSIS — R1310 Dysphagia, unspecified: Secondary | ICD-10-CM | POA: Diagnosis not present

## 2022-07-04 DIAGNOSIS — M6281 Muscle weakness (generalized): Secondary | ICD-10-CM | POA: Diagnosis not present

## 2022-07-05 DIAGNOSIS — R1310 Dysphagia, unspecified: Secondary | ICD-10-CM | POA: Diagnosis not present

## 2022-07-05 DIAGNOSIS — M6281 Muscle weakness (generalized): Secondary | ICD-10-CM | POA: Diagnosis not present

## 2022-07-06 DIAGNOSIS — R1310 Dysphagia, unspecified: Secondary | ICD-10-CM | POA: Diagnosis not present

## 2022-07-06 DIAGNOSIS — M6281 Muscle weakness (generalized): Secondary | ICD-10-CM | POA: Diagnosis not present

## 2022-07-07 DIAGNOSIS — M6281 Muscle weakness (generalized): Secondary | ICD-10-CM | POA: Diagnosis not present

## 2022-07-07 DIAGNOSIS — R1310 Dysphagia, unspecified: Secondary | ICD-10-CM | POA: Diagnosis not present

## 2022-07-08 DIAGNOSIS — R1310 Dysphagia, unspecified: Secondary | ICD-10-CM | POA: Diagnosis not present

## 2022-07-08 DIAGNOSIS — M6281 Muscle weakness (generalized): Secondary | ICD-10-CM | POA: Diagnosis not present

## 2022-07-09 DIAGNOSIS — R1310 Dysphagia, unspecified: Secondary | ICD-10-CM | POA: Diagnosis not present

## 2022-07-09 DIAGNOSIS — M6281 Muscle weakness (generalized): Secondary | ICD-10-CM | POA: Diagnosis not present

## 2022-07-10 DIAGNOSIS — E559 Vitamin D deficiency, unspecified: Secondary | ICD-10-CM | POA: Diagnosis not present

## 2022-07-10 DIAGNOSIS — R1310 Dysphagia, unspecified: Secondary | ICD-10-CM | POA: Diagnosis not present

## 2022-07-10 DIAGNOSIS — I10 Essential (primary) hypertension: Secondary | ICD-10-CM | POA: Diagnosis not present

## 2022-07-10 DIAGNOSIS — E119 Type 2 diabetes mellitus without complications: Secondary | ICD-10-CM | POA: Diagnosis not present

## 2022-07-10 DIAGNOSIS — N189 Chronic kidney disease, unspecified: Secondary | ICD-10-CM | POA: Diagnosis not present

## 2022-07-10 DIAGNOSIS — E782 Mixed hyperlipidemia: Secondary | ICD-10-CM | POA: Diagnosis not present

## 2022-07-10 DIAGNOSIS — M6281 Muscle weakness (generalized): Secondary | ICD-10-CM | POA: Diagnosis not present

## 2022-07-10 DIAGNOSIS — E038 Other specified hypothyroidism: Secondary | ICD-10-CM | POA: Diagnosis not present

## 2022-07-10 DIAGNOSIS — D518 Other vitamin B12 deficiency anemias: Secondary | ICD-10-CM | POA: Diagnosis not present

## 2022-07-11 DIAGNOSIS — M6281 Muscle weakness (generalized): Secondary | ICD-10-CM | POA: Diagnosis not present

## 2022-07-11 DIAGNOSIS — R1310 Dysphagia, unspecified: Secondary | ICD-10-CM | POA: Diagnosis not present

## 2022-07-12 DIAGNOSIS — M6281 Muscle weakness (generalized): Secondary | ICD-10-CM | POA: Diagnosis not present

## 2022-07-12 DIAGNOSIS — R1310 Dysphagia, unspecified: Secondary | ICD-10-CM | POA: Diagnosis not present

## 2022-07-13 DIAGNOSIS — M6281 Muscle weakness (generalized): Secondary | ICD-10-CM | POA: Diagnosis not present

## 2022-07-13 DIAGNOSIS — R1310 Dysphagia, unspecified: Secondary | ICD-10-CM | POA: Diagnosis not present

## 2022-07-14 DIAGNOSIS — R1310 Dysphagia, unspecified: Secondary | ICD-10-CM | POA: Diagnosis not present

## 2022-07-14 DIAGNOSIS — M6281 Muscle weakness (generalized): Secondary | ICD-10-CM | POA: Diagnosis not present

## 2022-07-15 DIAGNOSIS — M6281 Muscle weakness (generalized): Secondary | ICD-10-CM | POA: Diagnosis not present

## 2022-07-15 DIAGNOSIS — R1310 Dysphagia, unspecified: Secondary | ICD-10-CM | POA: Diagnosis not present

## 2022-07-16 DIAGNOSIS — R1310 Dysphagia, unspecified: Secondary | ICD-10-CM | POA: Diagnosis not present

## 2022-07-16 DIAGNOSIS — M6281 Muscle weakness (generalized): Secondary | ICD-10-CM | POA: Diagnosis not present

## 2022-07-17 DIAGNOSIS — R1310 Dysphagia, unspecified: Secondary | ICD-10-CM | POA: Diagnosis not present

## 2022-07-17 DIAGNOSIS — M6281 Muscle weakness (generalized): Secondary | ICD-10-CM | POA: Diagnosis not present

## 2022-07-18 DIAGNOSIS — M6281 Muscle weakness (generalized): Secondary | ICD-10-CM | POA: Diagnosis not present

## 2022-07-18 DIAGNOSIS — R1310 Dysphagia, unspecified: Secondary | ICD-10-CM | POA: Diagnosis not present

## 2022-07-19 DIAGNOSIS — M6281 Muscle weakness (generalized): Secondary | ICD-10-CM | POA: Diagnosis not present

## 2022-07-19 DIAGNOSIS — R1310 Dysphagia, unspecified: Secondary | ICD-10-CM | POA: Diagnosis not present

## 2022-07-20 DIAGNOSIS — R1310 Dysphagia, unspecified: Secondary | ICD-10-CM | POA: Diagnosis not present

## 2022-07-20 DIAGNOSIS — M6281 Muscle weakness (generalized): Secondary | ICD-10-CM | POA: Diagnosis not present

## 2022-07-21 DIAGNOSIS — M6281 Muscle weakness (generalized): Secondary | ICD-10-CM | POA: Diagnosis not present

## 2022-07-21 DIAGNOSIS — R1310 Dysphagia, unspecified: Secondary | ICD-10-CM | POA: Diagnosis not present

## 2022-07-22 DIAGNOSIS — R1310 Dysphagia, unspecified: Secondary | ICD-10-CM | POA: Diagnosis not present

## 2022-07-22 DIAGNOSIS — M6281 Muscle weakness (generalized): Secondary | ICD-10-CM | POA: Diagnosis not present

## 2022-07-22 DIAGNOSIS — R32 Unspecified urinary incontinence: Secondary | ICD-10-CM | POA: Diagnosis not present

## 2022-07-22 DIAGNOSIS — R159 Full incontinence of feces: Secondary | ICD-10-CM | POA: Diagnosis not present

## 2022-07-23 DIAGNOSIS — E785 Hyperlipidemia, unspecified: Secondary | ICD-10-CM | POA: Diagnosis not present

## 2022-07-23 DIAGNOSIS — I639 Cerebral infarction, unspecified: Secondary | ICD-10-CM | POA: Diagnosis not present

## 2022-07-23 DIAGNOSIS — R1310 Dysphagia, unspecified: Secondary | ICD-10-CM | POA: Diagnosis not present

## 2022-07-23 DIAGNOSIS — M6281 Muscle weakness (generalized): Secondary | ICD-10-CM | POA: Diagnosis not present

## 2022-07-23 DIAGNOSIS — D508 Other iron deficiency anemias: Secondary | ICD-10-CM | POA: Diagnosis not present

## 2022-07-23 DIAGNOSIS — M7070 Other bursitis of hip, unspecified hip: Secondary | ICD-10-CM | POA: Diagnosis not present

## 2022-07-23 DIAGNOSIS — N400 Enlarged prostate without lower urinary tract symptoms: Secondary | ICD-10-CM | POA: Diagnosis not present

## 2022-07-23 DIAGNOSIS — I10 Essential (primary) hypertension: Secondary | ICD-10-CM | POA: Diagnosis not present

## 2022-07-23 DIAGNOSIS — N189 Chronic kidney disease, unspecified: Secondary | ICD-10-CM | POA: Diagnosis not present

## 2022-07-24 DIAGNOSIS — R1310 Dysphagia, unspecified: Secondary | ICD-10-CM | POA: Diagnosis not present

## 2022-07-24 DIAGNOSIS — M6281 Muscle weakness (generalized): Secondary | ICD-10-CM | POA: Diagnosis not present

## 2022-07-25 DIAGNOSIS — R1310 Dysphagia, unspecified: Secondary | ICD-10-CM | POA: Diagnosis not present

## 2022-07-25 DIAGNOSIS — M6281 Muscle weakness (generalized): Secondary | ICD-10-CM | POA: Diagnosis not present

## 2022-07-26 DIAGNOSIS — I10 Essential (primary) hypertension: Secondary | ICD-10-CM | POA: Diagnosis not present

## 2022-07-26 DIAGNOSIS — R1310 Dysphagia, unspecified: Secondary | ICD-10-CM | POA: Diagnosis not present

## 2022-07-26 DIAGNOSIS — M6281 Muscle weakness (generalized): Secondary | ICD-10-CM | POA: Diagnosis not present

## 2022-07-27 DIAGNOSIS — R1310 Dysphagia, unspecified: Secondary | ICD-10-CM | POA: Diagnosis not present

## 2022-07-27 DIAGNOSIS — M6281 Muscle weakness (generalized): Secondary | ICD-10-CM | POA: Diagnosis not present

## 2022-07-28 DIAGNOSIS — M6281 Muscle weakness (generalized): Secondary | ICD-10-CM | POA: Diagnosis not present

## 2022-07-28 DIAGNOSIS — R1310 Dysphagia, unspecified: Secondary | ICD-10-CM | POA: Diagnosis not present

## 2022-07-29 DIAGNOSIS — M6281 Muscle weakness (generalized): Secondary | ICD-10-CM | POA: Diagnosis not present

## 2022-07-29 DIAGNOSIS — R1310 Dysphagia, unspecified: Secondary | ICD-10-CM | POA: Diagnosis not present

## 2022-08-08 DIAGNOSIS — N189 Chronic kidney disease, unspecified: Secondary | ICD-10-CM | POA: Diagnosis not present

## 2022-08-08 DIAGNOSIS — I10 Essential (primary) hypertension: Secondary | ICD-10-CM | POA: Diagnosis not present

## 2022-08-08 DIAGNOSIS — E559 Vitamin D deficiency, unspecified: Secondary | ICD-10-CM | POA: Diagnosis not present

## 2022-08-08 DIAGNOSIS — E782 Mixed hyperlipidemia: Secondary | ICD-10-CM | POA: Diagnosis not present

## 2022-08-08 DIAGNOSIS — E038 Other specified hypothyroidism: Secondary | ICD-10-CM | POA: Diagnosis not present

## 2022-08-08 DIAGNOSIS — D518 Other vitamin B12 deficiency anemias: Secondary | ICD-10-CM | POA: Diagnosis not present

## 2022-08-08 DIAGNOSIS — E119 Type 2 diabetes mellitus without complications: Secondary | ICD-10-CM | POA: Diagnosis not present

## 2022-08-26 DIAGNOSIS — I10 Essential (primary) hypertension: Secondary | ICD-10-CM | POA: Diagnosis not present

## 2022-08-27 DIAGNOSIS — I10 Essential (primary) hypertension: Secondary | ICD-10-CM | POA: Diagnosis not present

## 2022-08-27 DIAGNOSIS — D508 Other iron deficiency anemias: Secondary | ICD-10-CM | POA: Diagnosis not present

## 2022-08-27 DIAGNOSIS — E785 Hyperlipidemia, unspecified: Secondary | ICD-10-CM | POA: Diagnosis not present

## 2022-08-27 DIAGNOSIS — N189 Chronic kidney disease, unspecified: Secondary | ICD-10-CM | POA: Diagnosis not present

## 2022-08-27 DIAGNOSIS — I639 Cerebral infarction, unspecified: Secondary | ICD-10-CM | POA: Diagnosis not present

## 2022-08-27 DIAGNOSIS — N400 Enlarged prostate without lower urinary tract symptoms: Secondary | ICD-10-CM | POA: Diagnosis not present

## 2022-09-06 DIAGNOSIS — E119 Type 2 diabetes mellitus without complications: Secondary | ICD-10-CM | POA: Diagnosis not present

## 2022-09-06 DIAGNOSIS — E782 Mixed hyperlipidemia: Secondary | ICD-10-CM | POA: Diagnosis not present

## 2022-09-06 DIAGNOSIS — D518 Other vitamin B12 deficiency anemias: Secondary | ICD-10-CM | POA: Diagnosis not present

## 2022-09-06 DIAGNOSIS — I10 Essential (primary) hypertension: Secondary | ICD-10-CM | POA: Diagnosis not present

## 2022-09-06 DIAGNOSIS — N189 Chronic kidney disease, unspecified: Secondary | ICD-10-CM | POA: Diagnosis not present

## 2022-09-06 DIAGNOSIS — E559 Vitamin D deficiency, unspecified: Secondary | ICD-10-CM | POA: Diagnosis not present

## 2022-09-06 DIAGNOSIS — E038 Other specified hypothyroidism: Secondary | ICD-10-CM | POA: Diagnosis not present

## 2022-09-24 DIAGNOSIS — R1310 Dysphagia, unspecified: Secondary | ICD-10-CM | POA: Diagnosis not present

## 2022-09-24 DIAGNOSIS — M6281 Muscle weakness (generalized): Secondary | ICD-10-CM | POA: Diagnosis not present

## 2022-09-25 DIAGNOSIS — B351 Tinea unguium: Secondary | ICD-10-CM | POA: Diagnosis not present

## 2022-09-25 DIAGNOSIS — M79671 Pain in right foot: Secondary | ICD-10-CM | POA: Diagnosis not present

## 2022-09-25 DIAGNOSIS — L600 Ingrowing nail: Secondary | ICD-10-CM | POA: Diagnosis not present

## 2022-09-25 DIAGNOSIS — M6281 Muscle weakness (generalized): Secondary | ICD-10-CM | POA: Diagnosis not present

## 2022-09-25 DIAGNOSIS — R1310 Dysphagia, unspecified: Secondary | ICD-10-CM | POA: Diagnosis not present

## 2022-09-25 DIAGNOSIS — M79672 Pain in left foot: Secondary | ICD-10-CM | POA: Diagnosis not present

## 2022-09-26 DIAGNOSIS — M6281 Muscle weakness (generalized): Secondary | ICD-10-CM | POA: Diagnosis not present

## 2022-09-26 DIAGNOSIS — R1310 Dysphagia, unspecified: Secondary | ICD-10-CM | POA: Diagnosis not present

## 2022-09-27 DIAGNOSIS — R1310 Dysphagia, unspecified: Secondary | ICD-10-CM | POA: Diagnosis not present

## 2022-09-27 DIAGNOSIS — M6281 Muscle weakness (generalized): Secondary | ICD-10-CM | POA: Diagnosis not present

## 2022-09-28 DIAGNOSIS — R1310 Dysphagia, unspecified: Secondary | ICD-10-CM | POA: Diagnosis not present

## 2022-09-28 DIAGNOSIS — M6281 Muscle weakness (generalized): Secondary | ICD-10-CM | POA: Diagnosis not present

## 2022-09-29 DIAGNOSIS — M6281 Muscle weakness (generalized): Secondary | ICD-10-CM | POA: Diagnosis not present

## 2022-09-29 DIAGNOSIS — R1310 Dysphagia, unspecified: Secondary | ICD-10-CM | POA: Diagnosis not present

## 2022-09-30 DIAGNOSIS — M6281 Muscle weakness (generalized): Secondary | ICD-10-CM | POA: Diagnosis not present

## 2022-09-30 DIAGNOSIS — R1310 Dysphagia, unspecified: Secondary | ICD-10-CM | POA: Diagnosis not present

## 2022-10-01 ENCOUNTER — Telehealth: Payer: Self-pay | Admitting: *Deleted

## 2022-10-01 DIAGNOSIS — R1310 Dysphagia, unspecified: Secondary | ICD-10-CM | POA: Diagnosis not present

## 2022-10-01 DIAGNOSIS — N189 Chronic kidney disease, unspecified: Secondary | ICD-10-CM | POA: Diagnosis not present

## 2022-10-01 DIAGNOSIS — N400 Enlarged prostate without lower urinary tract symptoms: Secondary | ICD-10-CM | POA: Diagnosis not present

## 2022-10-01 DIAGNOSIS — D508 Other iron deficiency anemias: Secondary | ICD-10-CM | POA: Diagnosis not present

## 2022-10-01 DIAGNOSIS — E785 Hyperlipidemia, unspecified: Secondary | ICD-10-CM | POA: Diagnosis not present

## 2022-10-01 DIAGNOSIS — I10 Essential (primary) hypertension: Secondary | ICD-10-CM | POA: Diagnosis not present

## 2022-10-01 DIAGNOSIS — M6281 Muscle weakness (generalized): Secondary | ICD-10-CM | POA: Diagnosis not present

## 2022-10-01 NOTE — Patient Outreach (Signed)
  Care Coordination   10/01/2022 Name: Jon Smith MRN: [id number] DOB: [birth year]   Care Coordination Outreach Attempts:  An unsuccessful telephone outreach was attempted today to offer the patient information about available care coordination services as a benefit of their health plan.   Follow Up Plan:  Additional outreach attempts will be made to offer the patient care coordination information and services.   Encounter Outcome:  No Answer  Care Coordination Interventions Activated:  No   Care Coordination Interventions:  No, not indicated    Jacqlyn Larsen [facility] [place], [place] RN Care Coordinator [phone]

## 2022-10-02 ENCOUNTER — Telehealth: Payer: Self-pay | Admitting: *Deleted

## 2022-10-02 DIAGNOSIS — M6281 Muscle weakness (generalized): Secondary | ICD-10-CM | POA: Diagnosis not present

## 2022-10-02 DIAGNOSIS — R1310 Dysphagia, unspecified: Secondary | ICD-10-CM | POA: Diagnosis not present

## 2022-10-02 NOTE — Patient Outreach (Signed)
  Care Coordination   10/02/2022 Name: Jon Smith MRN: [id number] DOB: [birth year]   Care Coordination Outreach Attempts:  A second unsuccessful outreach was attempted today to offer the patient with information about available care coordination services as a benefit of their health plan.     Follow Up Plan:  Additional outreach attempts will be made to offer the patient care coordination information and services.   Encounter Outcome:  No Answer  Care Coordination Interventions Activated:  No   Care Coordination Interventions:  No, not indicated    Jacqlyn Larsen [facility], [place] RN Care Coordinator [phone]

## 2022-10-03 ENCOUNTER — Telehealth: Payer: Self-pay | Admitting: *Deleted

## 2022-10-03 DIAGNOSIS — R1310 Dysphagia, unspecified: Secondary | ICD-10-CM | POA: Diagnosis not present

## 2022-10-03 DIAGNOSIS — M6281 Muscle weakness (generalized): Secondary | ICD-10-CM | POA: Diagnosis not present

## 2022-10-03 NOTE — Patient Outreach (Signed)
  Care Coordination   10/03/2022 Name: Jon Smith MRN: [id number] DOB: Oct 21, 1928   Care Coordination Outreach Attempts:  A third unsuccessful outreach was attempted today to offer the patient with information about available care coordination services as a benefit of their health plan.   Follow Up Plan:  No further outreach attempts will be made at this time. We have been unable to contact the patient to offer or enroll patient in care coordination services  Encounter Outcome:  No Answer  Care Coordination Interventions Activated:  No   Care Coordination Interventions:  No, not indicated    Jacqlyn Larsen [facility] B[facility] RN Care Coordinator [phone]

## 2022-10-04 DIAGNOSIS — M6281 Muscle weakness (generalized): Secondary | ICD-10-CM | POA: Diagnosis not present

## 2022-10-04 DIAGNOSIS — R1310 Dysphagia, unspecified: Secondary | ICD-10-CM | POA: Diagnosis not present

## 2022-10-05 DIAGNOSIS — M6281 Muscle weakness (generalized): Secondary | ICD-10-CM | POA: Diagnosis not present

## 2022-10-05 DIAGNOSIS — R1310 Dysphagia, unspecified: Secondary | ICD-10-CM | POA: Diagnosis not present

## 2022-10-06 DIAGNOSIS — M6281 Muscle weakness (generalized): Secondary | ICD-10-CM | POA: Diagnosis not present

## 2022-10-06 DIAGNOSIS — R1310 Dysphagia, unspecified: Secondary | ICD-10-CM | POA: Diagnosis not present

## 2022-10-07 DIAGNOSIS — M6281 Muscle weakness (generalized): Secondary | ICD-10-CM | POA: Diagnosis not present

## 2022-10-07 DIAGNOSIS — E785 Hyperlipidemia, unspecified: Secondary | ICD-10-CM | POA: Diagnosis not present

## 2022-10-07 DIAGNOSIS — E559 Vitamin D deficiency, unspecified: Secondary | ICD-10-CM | POA: Diagnosis not present

## 2022-10-07 DIAGNOSIS — N189 Chronic kidney disease, unspecified: Secondary | ICD-10-CM | POA: Diagnosis not present

## 2022-10-07 DIAGNOSIS — E038 Other specified hypothyroidism: Secondary | ICD-10-CM | POA: Diagnosis not present

## 2022-10-07 DIAGNOSIS — R1310 Dysphagia, unspecified: Secondary | ICD-10-CM | POA: Diagnosis not present

## 2022-10-07 DIAGNOSIS — E119 Type 2 diabetes mellitus without complications: Secondary | ICD-10-CM | POA: Diagnosis not present

## 2022-10-07 DIAGNOSIS — I10 Essential (primary) hypertension: Secondary | ICD-10-CM | POA: Diagnosis not present

## 2022-10-07 DIAGNOSIS — D518 Other vitamin B12 deficiency anemias: Secondary | ICD-10-CM | POA: Diagnosis not present

## 2022-10-08 DIAGNOSIS — M6281 Muscle weakness (generalized): Secondary | ICD-10-CM | POA: Diagnosis not present

## 2022-10-08 DIAGNOSIS — R1310 Dysphagia, unspecified: Secondary | ICD-10-CM | POA: Diagnosis not present

## 2022-10-09 DIAGNOSIS — M6281 Muscle weakness (generalized): Secondary | ICD-10-CM | POA: Diagnosis not present

## 2022-10-09 DIAGNOSIS — R1310 Dysphagia, unspecified: Secondary | ICD-10-CM | POA: Diagnosis not present

## 2022-10-10 DIAGNOSIS — M6281 Muscle weakness (generalized): Secondary | ICD-10-CM | POA: Diagnosis not present

## 2022-10-10 DIAGNOSIS — R1310 Dysphagia, unspecified: Secondary | ICD-10-CM | POA: Diagnosis not present

## 2022-10-11 DIAGNOSIS — M6281 Muscle weakness (generalized): Secondary | ICD-10-CM | POA: Diagnosis not present

## 2022-10-11 DIAGNOSIS — R1310 Dysphagia, unspecified: Secondary | ICD-10-CM | POA: Diagnosis not present

## 2022-10-12 DIAGNOSIS — M6281 Muscle weakness (generalized): Secondary | ICD-10-CM | POA: Diagnosis not present

## 2022-10-12 DIAGNOSIS — R1310 Dysphagia, unspecified: Secondary | ICD-10-CM | POA: Diagnosis not present

## 2022-10-13 DIAGNOSIS — R1310 Dysphagia, unspecified: Secondary | ICD-10-CM | POA: Diagnosis not present

## 2022-10-13 DIAGNOSIS — M6281 Muscle weakness (generalized): Secondary | ICD-10-CM | POA: Diagnosis not present

## 2022-10-14 DIAGNOSIS — R1310 Dysphagia, unspecified: Secondary | ICD-10-CM | POA: Diagnosis not present

## 2022-10-14 DIAGNOSIS — M6281 Muscle weakness (generalized): Secondary | ICD-10-CM | POA: Diagnosis not present

## 2022-10-15 DIAGNOSIS — M6281 Muscle weakness (generalized): Secondary | ICD-10-CM | POA: Diagnosis not present

## 2022-10-15 DIAGNOSIS — R1310 Dysphagia, unspecified: Secondary | ICD-10-CM | POA: Diagnosis not present

## 2022-10-16 DIAGNOSIS — M6281 Muscle weakness (generalized): Secondary | ICD-10-CM | POA: Diagnosis not present

## 2022-10-16 DIAGNOSIS — R1310 Dysphagia, unspecified: Secondary | ICD-10-CM | POA: Diagnosis not present

## 2022-10-17 DIAGNOSIS — M6281 Muscle weakness (generalized): Secondary | ICD-10-CM | POA: Diagnosis not present

## 2022-10-17 DIAGNOSIS — R1310 Dysphagia, unspecified: Secondary | ICD-10-CM | POA: Diagnosis not present

## 2022-10-18 DIAGNOSIS — M6281 Muscle weakness (generalized): Secondary | ICD-10-CM | POA: Diagnosis not present

## 2022-10-18 DIAGNOSIS — R1310 Dysphagia, unspecified: Secondary | ICD-10-CM | POA: Diagnosis not present

## 2022-10-19 DIAGNOSIS — M6281 Muscle weakness (generalized): Secondary | ICD-10-CM | POA: Diagnosis not present

## 2022-10-19 DIAGNOSIS — R1310 Dysphagia, unspecified: Secondary | ICD-10-CM | POA: Diagnosis not present

## 2022-10-20 DIAGNOSIS — M6281 Muscle weakness (generalized): Secondary | ICD-10-CM | POA: Diagnosis not present

## 2022-10-20 DIAGNOSIS — R1310 Dysphagia, unspecified: Secondary | ICD-10-CM | POA: Diagnosis not present

## 2022-10-21 DIAGNOSIS — R1310 Dysphagia, unspecified: Secondary | ICD-10-CM | POA: Diagnosis not present

## 2022-10-21 DIAGNOSIS — M6281 Muscle weakness (generalized): Secondary | ICD-10-CM | POA: Diagnosis not present

## 2022-10-22 DIAGNOSIS — R1310 Dysphagia, unspecified: Secondary | ICD-10-CM | POA: Diagnosis not present

## 2022-10-22 DIAGNOSIS — M6281 Muscle weakness (generalized): Secondary | ICD-10-CM | POA: Diagnosis not present

## 2022-10-23 DIAGNOSIS — R1310 Dysphagia, unspecified: Secondary | ICD-10-CM | POA: Diagnosis not present

## 2022-10-23 DIAGNOSIS — M6281 Muscle weakness (generalized): Secondary | ICD-10-CM | POA: Diagnosis not present

## 2022-10-24 DIAGNOSIS — R1310 Dysphagia, unspecified: Secondary | ICD-10-CM | POA: Diagnosis not present

## 2022-10-24 DIAGNOSIS — M6281 Muscle weakness (generalized): Secondary | ICD-10-CM | POA: Diagnosis not present

## 2022-10-25 DIAGNOSIS — R1310 Dysphagia, unspecified: Secondary | ICD-10-CM | POA: Diagnosis not present

## 2022-10-25 DIAGNOSIS — M6281 Muscle weakness (generalized): Secondary | ICD-10-CM | POA: Diagnosis not present

## 2022-10-26 DIAGNOSIS — M6281 Muscle weakness (generalized): Secondary | ICD-10-CM | POA: Diagnosis not present

## 2022-10-26 DIAGNOSIS — I10 Essential (primary) hypertension: Secondary | ICD-10-CM | POA: Diagnosis not present

## 2022-10-26 DIAGNOSIS — R1310 Dysphagia, unspecified: Secondary | ICD-10-CM | POA: Diagnosis not present

## 2022-10-27 DIAGNOSIS — M6281 Muscle weakness (generalized): Secondary | ICD-10-CM | POA: Diagnosis not present

## 2022-10-27 DIAGNOSIS — R1310 Dysphagia, unspecified: Secondary | ICD-10-CM | POA: Diagnosis not present

## 2022-10-28 DIAGNOSIS — M6281 Muscle weakness (generalized): Secondary | ICD-10-CM | POA: Diagnosis not present

## 2022-10-28 DIAGNOSIS — R1310 Dysphagia, unspecified: Secondary | ICD-10-CM | POA: Diagnosis not present

## 2022-10-29 DIAGNOSIS — R1310 Dysphagia, unspecified: Secondary | ICD-10-CM | POA: Diagnosis not present

## 2022-10-29 DIAGNOSIS — D508 Other iron deficiency anemias: Secondary | ICD-10-CM | POA: Diagnosis not present

## 2022-10-29 DIAGNOSIS — M6281 Muscle weakness (generalized): Secondary | ICD-10-CM | POA: Diagnosis not present

## 2022-10-29 DIAGNOSIS — N400 Enlarged prostate without lower urinary tract symptoms: Secondary | ICD-10-CM | POA: Diagnosis not present

## 2022-10-29 DIAGNOSIS — I10 Essential (primary) hypertension: Secondary | ICD-10-CM | POA: Diagnosis not present

## 2022-10-29 DIAGNOSIS — I639 Cerebral infarction, unspecified: Secondary | ICD-10-CM | POA: Diagnosis not present

## 2022-10-30 DIAGNOSIS — M6281 Muscle weakness (generalized): Secondary | ICD-10-CM | POA: Diagnosis not present

## 2022-10-30 DIAGNOSIS — R1310 Dysphagia, unspecified: Secondary | ICD-10-CM | POA: Diagnosis not present

## 2022-10-31 DIAGNOSIS — M6281 Muscle weakness (generalized): Secondary | ICD-10-CM | POA: Diagnosis not present

## 2022-10-31 DIAGNOSIS — R1310 Dysphagia, unspecified: Secondary | ICD-10-CM | POA: Diagnosis not present

## 2022-11-01 DIAGNOSIS — R1310 Dysphagia, unspecified: Secondary | ICD-10-CM | POA: Diagnosis not present

## 2022-11-01 DIAGNOSIS — M6281 Muscle weakness (generalized): Secondary | ICD-10-CM | POA: Diagnosis not present

## 2022-11-02 DIAGNOSIS — M6281 Muscle weakness (generalized): Secondary | ICD-10-CM | POA: Diagnosis not present

## 2022-11-02 DIAGNOSIS — R1310 Dysphagia, unspecified: Secondary | ICD-10-CM | POA: Diagnosis not present

## 2022-11-03 DIAGNOSIS — M6281 Muscle weakness (generalized): Secondary | ICD-10-CM | POA: Diagnosis not present

## 2022-11-03 DIAGNOSIS — R1310 Dysphagia, unspecified: Secondary | ICD-10-CM | POA: Diagnosis not present

## 2022-11-04 DIAGNOSIS — R1310 Dysphagia, unspecified: Secondary | ICD-10-CM | POA: Diagnosis not present

## 2022-11-04 DIAGNOSIS — M6281 Muscle weakness (generalized): Secondary | ICD-10-CM | POA: Diagnosis not present

## 2022-11-05 DIAGNOSIS — M6281 Muscle weakness (generalized): Secondary | ICD-10-CM | POA: Diagnosis not present

## 2022-11-05 DIAGNOSIS — R1310 Dysphagia, unspecified: Secondary | ICD-10-CM | POA: Diagnosis not present

## 2022-11-06 DIAGNOSIS — M6281 Muscle weakness (generalized): Secondary | ICD-10-CM | POA: Diagnosis not present

## 2022-11-06 DIAGNOSIS — R1310 Dysphagia, unspecified: Secondary | ICD-10-CM | POA: Diagnosis not present

## 2022-11-06 IMAGING — CR DG HIP (WITH OR WITHOUT PELVIS) 2-3V*L*
3 series · 3 of 3 positions shown · non-contrast
Comparison: 03/17/2021 abdominal and pelvic CT.

CLINICAL DATA: Acute LEFT hip pain for 1 day.  No known injury.

EXAM:
DG HIP (WITH OR WITHOUT PELVIS) 2-3V LEFT

[pelvis ap]
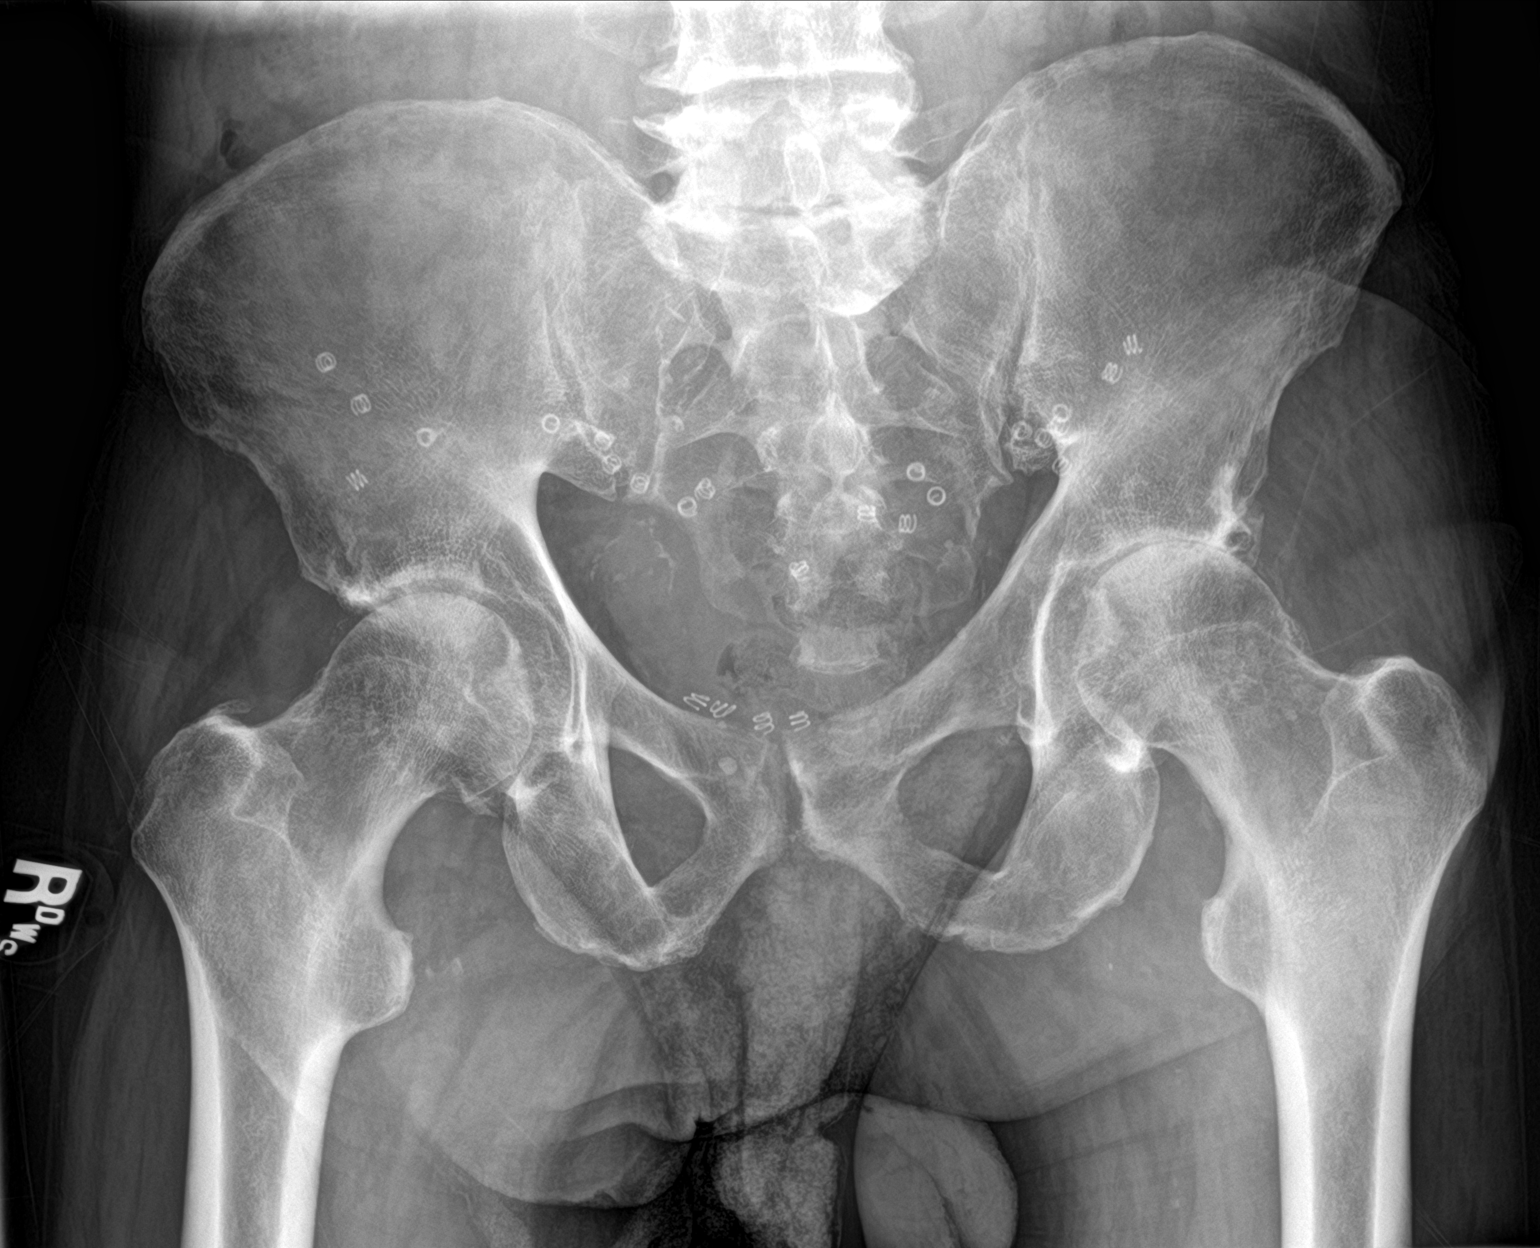

[hip ap]
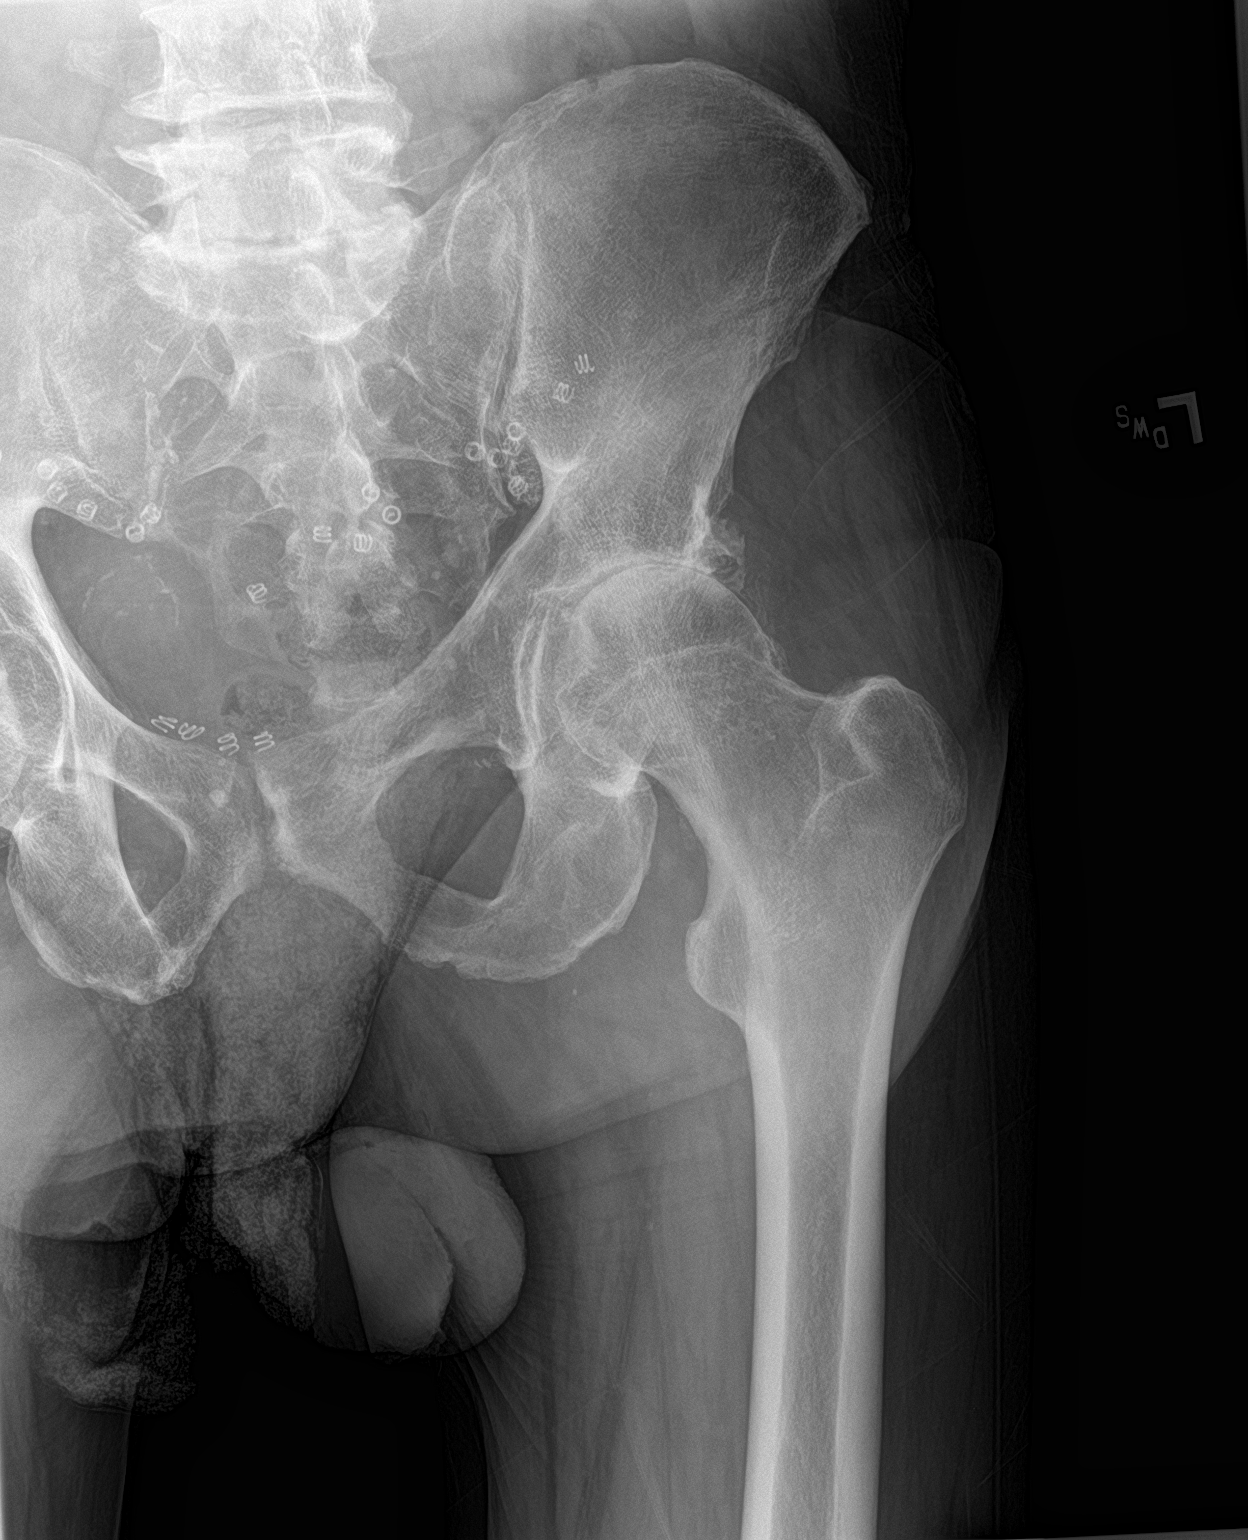

[hip lat]
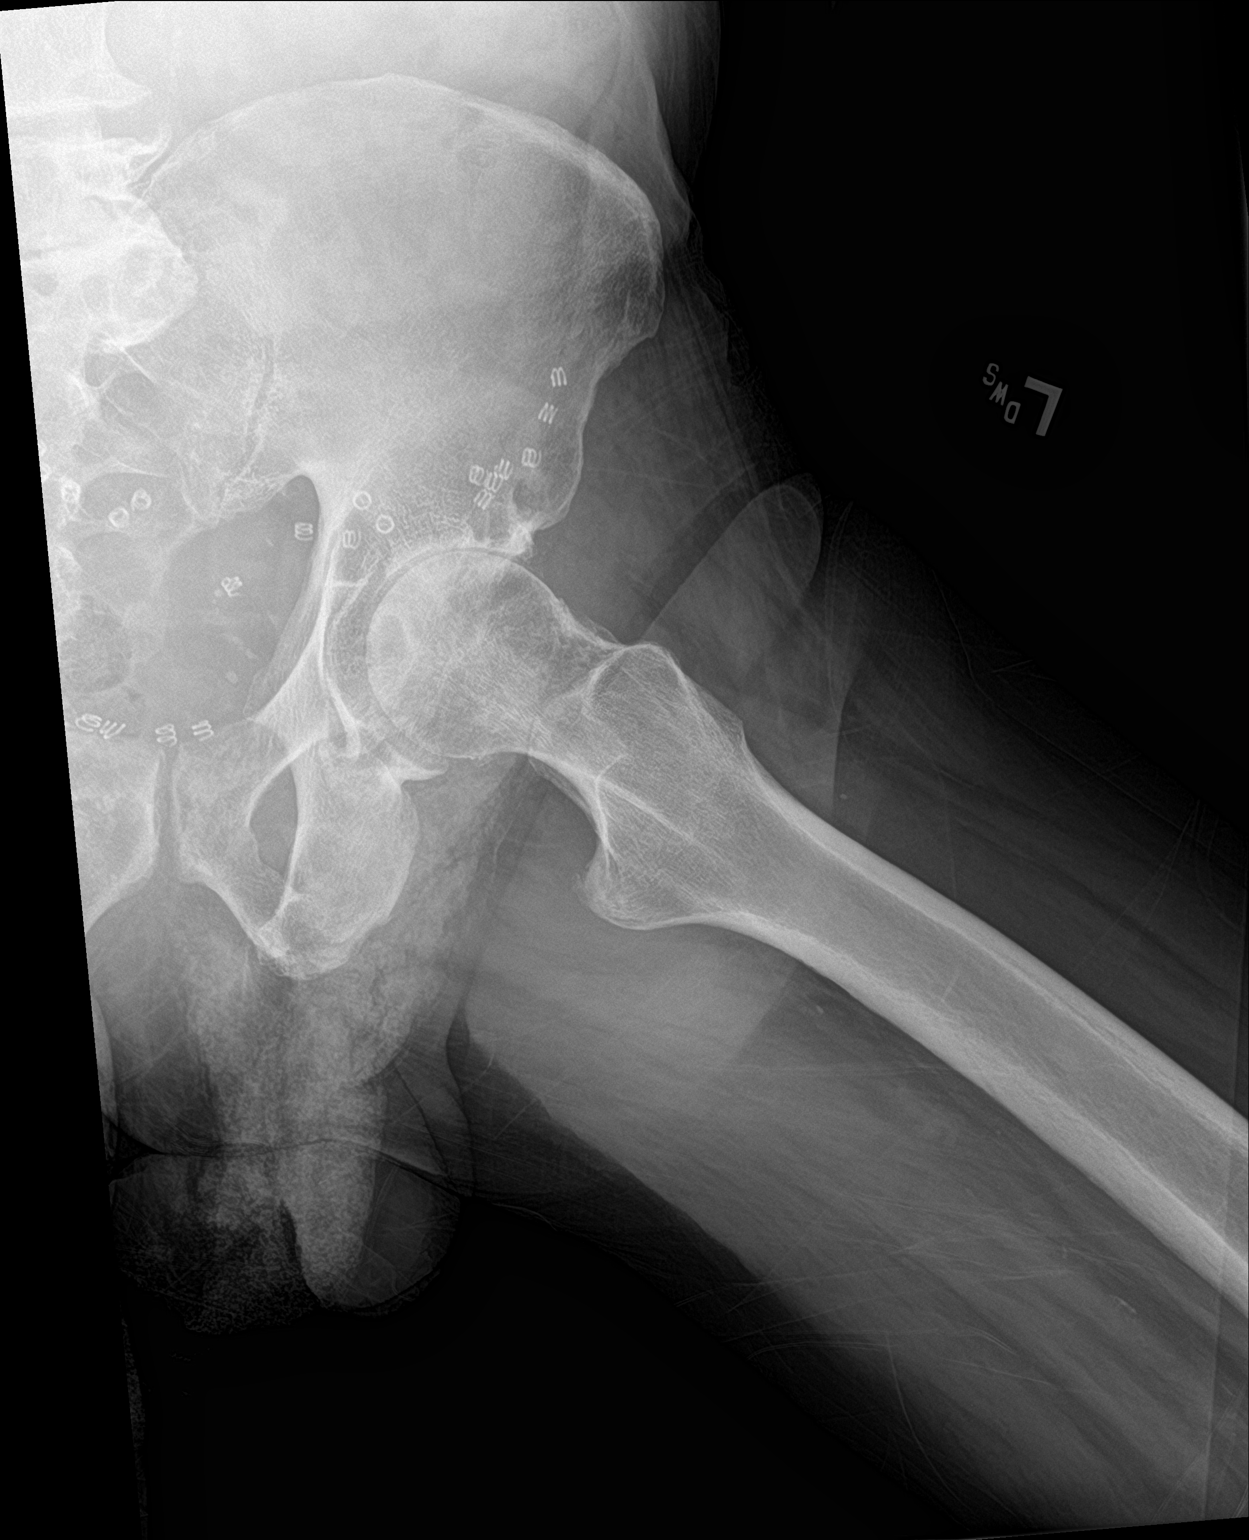

[3 of 3 positions shown; findings below may reference images not displayed]

FINDINGS: There is no evidence of acute fracture or dislocation.

Moderate to severe joint space narrowing/osteophytosis within the
LEFT hip noted with juxta-articular sclerosis.

Mild to moderate degenerative changes in the RIGHT hip noted with
mild joint space narrowing and juxta-articular sclerosis.

No focal bony lesions are identified.

Degenerative changes in the LOWER lumbar spine are identified.
IMPRESSION: 1. No evidence of acute abnormality.
2. Degenerative changes within both hips, moderate to severe on the
LEFT and mild to moderate on the RIGHT.

## 2022-11-06 IMAGING — CR DG LUMBAR SPINE 2-3V
3 series · 3 of 3 positions shown · non-contrast
Comparison: 03/17/2021 abdominal/pelvic CT

CLINICAL DATA: Acute low back pain.  No known injury.

EXAM:
LUMBAR SPINE - 3 VIEW

[l-spine ap]
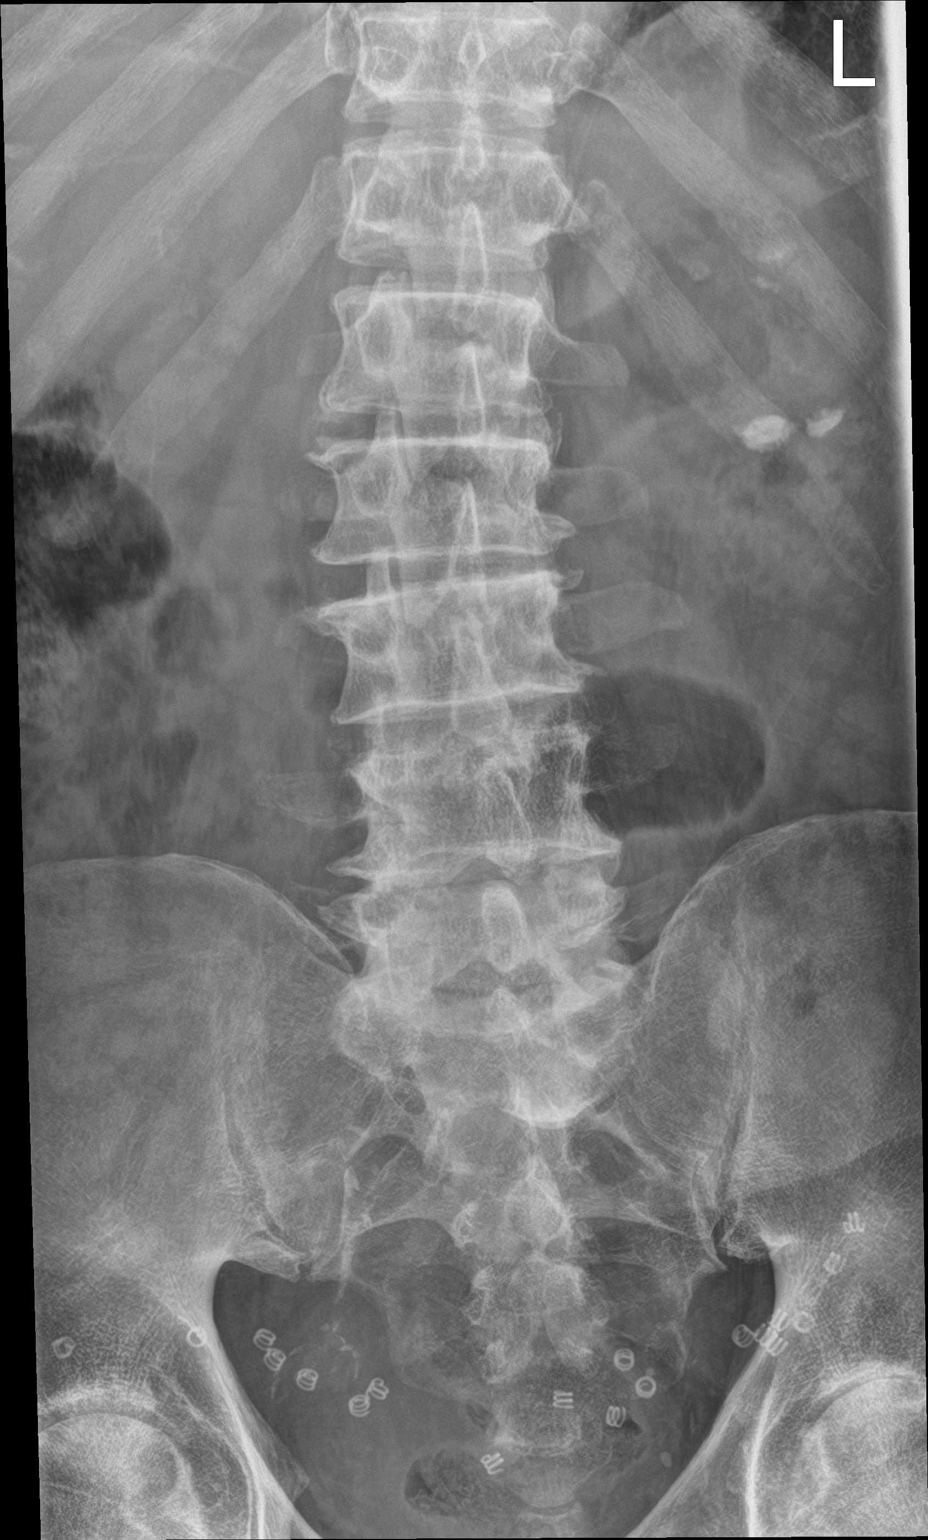

[l-spine lat]
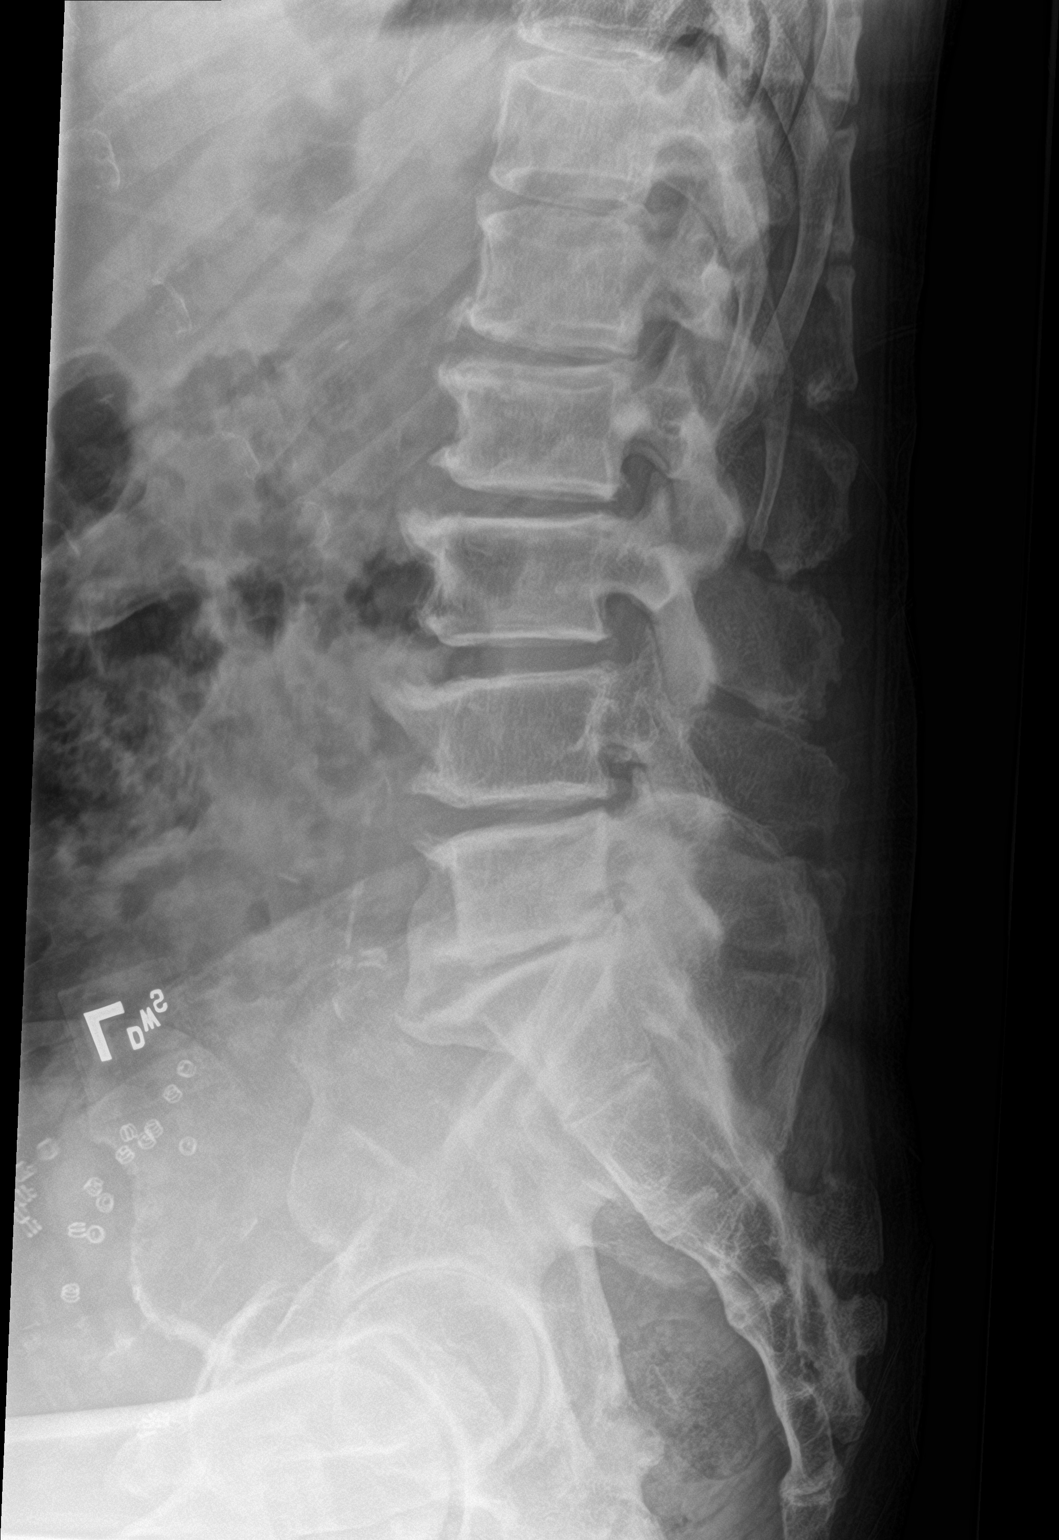

[l-spine spot]
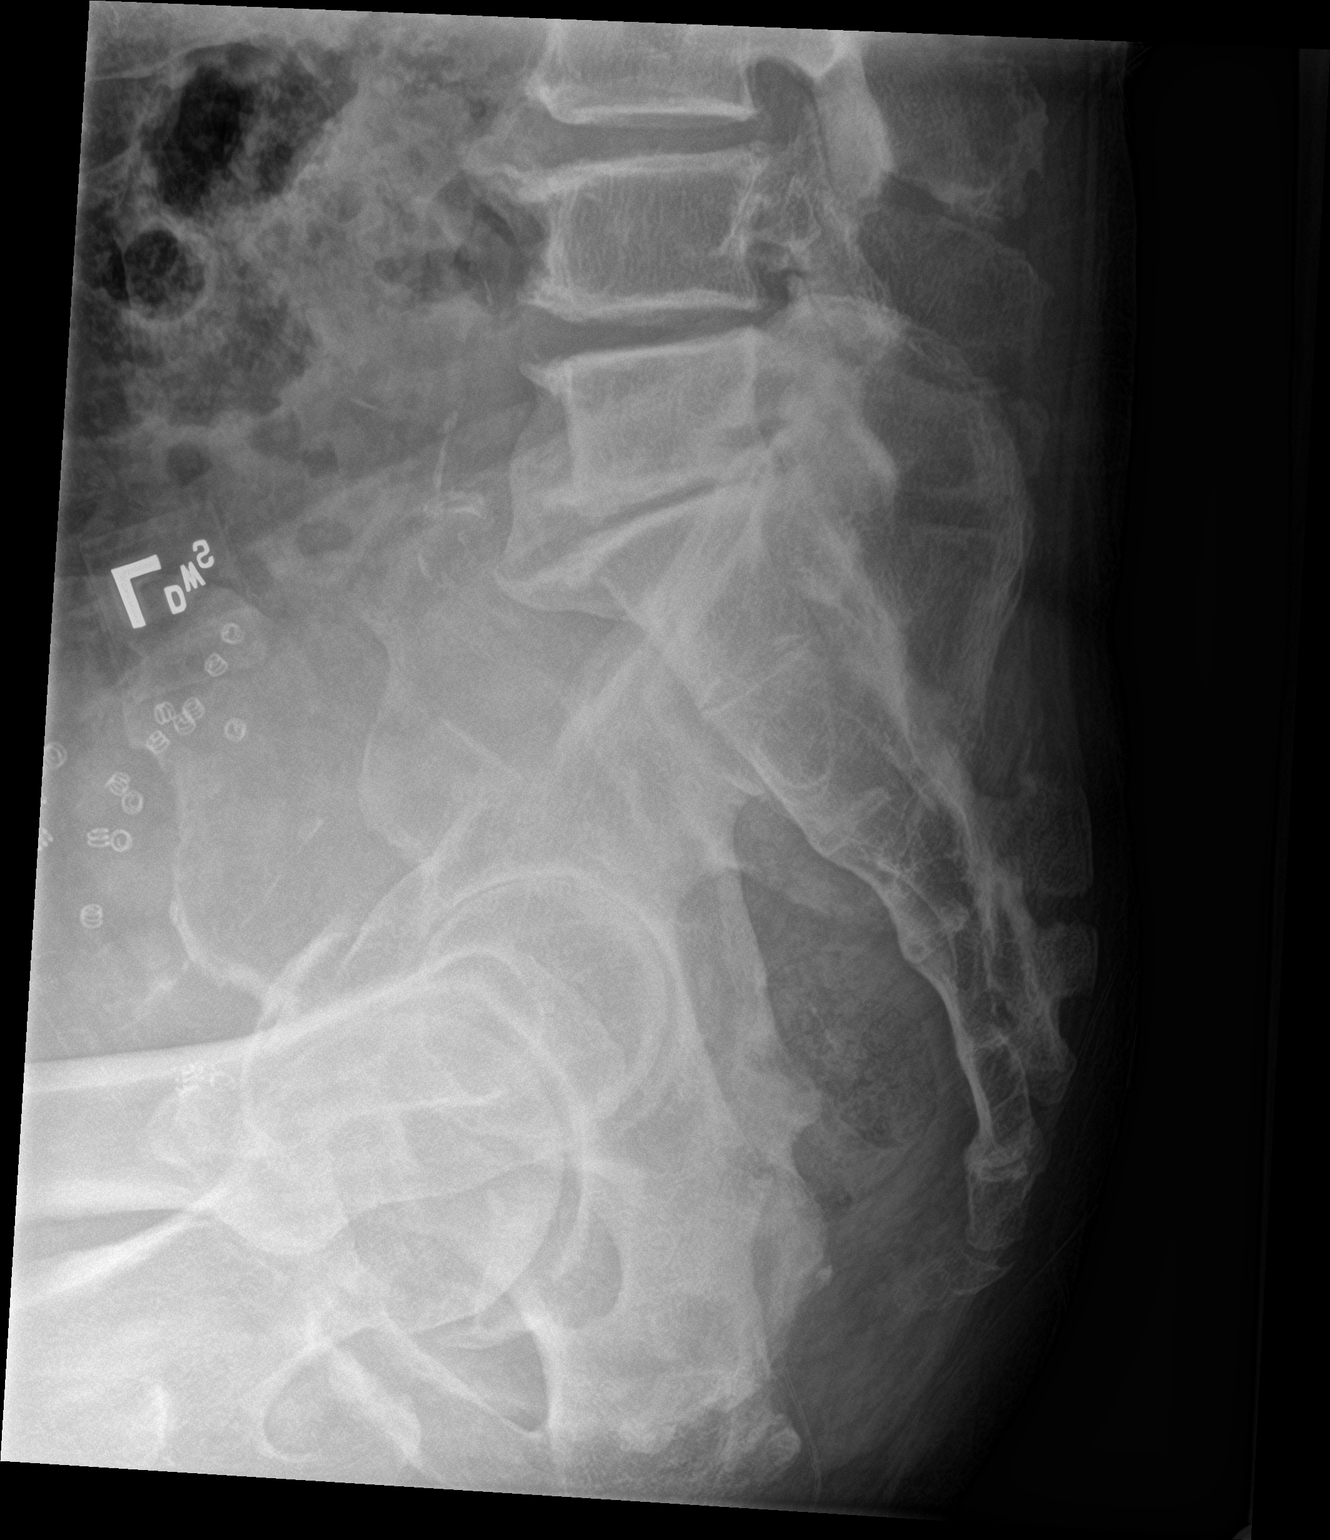

[3 of 3 positions shown; findings below may reference images not displayed]

FINDINGS: 5 non rib-bearing lumbar type vertebra are noted in normal
alignment.

There is no evidence of acute fracture or subluxation.

Multilevel degenerative disc disease/spondylosis is unchanged,
moderate at L4-5 and moderate to severe at L5-S1. Facet arthropathy
in the LOWER lumbar spine again noted.

No focal bony lesions are identified.
IMPRESSION: No evidence of acute abnormality.

Multilevel degenerative changes again identified.

## 2022-11-07 DIAGNOSIS — E782 Mixed hyperlipidemia: Secondary | ICD-10-CM | POA: Diagnosis not present

## 2022-11-07 DIAGNOSIS — E038 Other specified hypothyroidism: Secondary | ICD-10-CM | POA: Diagnosis not present

## 2022-11-07 DIAGNOSIS — E559 Vitamin D deficiency, unspecified: Secondary | ICD-10-CM | POA: Diagnosis not present

## 2022-11-07 DIAGNOSIS — I10 Essential (primary) hypertension: Secondary | ICD-10-CM | POA: Diagnosis not present

## 2022-11-07 DIAGNOSIS — N189 Chronic kidney disease, unspecified: Secondary | ICD-10-CM | POA: Diagnosis not present

## 2022-11-07 DIAGNOSIS — D518 Other vitamin B12 deficiency anemias: Secondary | ICD-10-CM | POA: Diagnosis not present

## 2022-11-07 DIAGNOSIS — R1310 Dysphagia, unspecified: Secondary | ICD-10-CM | POA: Diagnosis not present

## 2022-11-07 DIAGNOSIS — E119 Type 2 diabetes mellitus without complications: Secondary | ICD-10-CM | POA: Diagnosis not present

## 2022-11-07 DIAGNOSIS — M6281 Muscle weakness (generalized): Secondary | ICD-10-CM | POA: Diagnosis not present

## 2022-11-08 DIAGNOSIS — M6281 Muscle weakness (generalized): Secondary | ICD-10-CM | POA: Diagnosis not present

## 2022-11-08 DIAGNOSIS — R1310 Dysphagia, unspecified: Secondary | ICD-10-CM | POA: Diagnosis not present

## 2022-11-09 DIAGNOSIS — M6281 Muscle weakness (generalized): Secondary | ICD-10-CM | POA: Diagnosis not present

## 2022-11-09 DIAGNOSIS — R1310 Dysphagia, unspecified: Secondary | ICD-10-CM | POA: Diagnosis not present

## 2022-11-10 DIAGNOSIS — R1310 Dysphagia, unspecified: Secondary | ICD-10-CM | POA: Diagnosis not present

## 2022-11-10 DIAGNOSIS — M6281 Muscle weakness (generalized): Secondary | ICD-10-CM | POA: Diagnosis not present

## 2022-11-11 DIAGNOSIS — M6281 Muscle weakness (generalized): Secondary | ICD-10-CM | POA: Diagnosis not present

## 2022-11-11 DIAGNOSIS — R1310 Dysphagia, unspecified: Secondary | ICD-10-CM | POA: Diagnosis not present

## 2022-11-12 DIAGNOSIS — R1310 Dysphagia, unspecified: Secondary | ICD-10-CM | POA: Diagnosis not present

## 2022-11-12 DIAGNOSIS — R011 Cardiac murmur, unspecified: Secondary | ICD-10-CM | POA: Diagnosis not present

## 2022-11-12 DIAGNOSIS — M6281 Muscle weakness (generalized): Secondary | ICD-10-CM | POA: Diagnosis not present

## 2022-11-13 DIAGNOSIS — M6281 Muscle weakness (generalized): Secondary | ICD-10-CM | POA: Diagnosis not present

## 2022-11-13 DIAGNOSIS — R1310 Dysphagia, unspecified: Secondary | ICD-10-CM | POA: Diagnosis not present

## 2022-11-14 DIAGNOSIS — I6523 Occlusion and stenosis of bilateral carotid arteries: Secondary | ICD-10-CM | POA: Diagnosis not present

## 2022-11-14 DIAGNOSIS — R0989 Other specified symptoms and signs involving the circulatory and respiratory systems: Secondary | ICD-10-CM | POA: Diagnosis not present

## 2022-11-14 DIAGNOSIS — R1310 Dysphagia, unspecified: Secondary | ICD-10-CM | POA: Diagnosis not present

## 2022-11-14 DIAGNOSIS — M6281 Muscle weakness (generalized): Secondary | ICD-10-CM | POA: Diagnosis not present

## 2022-11-15 DIAGNOSIS — R1310 Dysphagia, unspecified: Secondary | ICD-10-CM | POA: Diagnosis not present

## 2022-11-15 DIAGNOSIS — M6281 Muscle weakness (generalized): Secondary | ICD-10-CM | POA: Diagnosis not present

## 2022-11-16 DIAGNOSIS — M6281 Muscle weakness (generalized): Secondary | ICD-10-CM | POA: Diagnosis not present

## 2022-11-16 DIAGNOSIS — R1310 Dysphagia, unspecified: Secondary | ICD-10-CM | POA: Diagnosis not present

## 2022-11-17 DIAGNOSIS — R1310 Dysphagia, unspecified: Secondary | ICD-10-CM | POA: Diagnosis not present

## 2022-11-17 DIAGNOSIS — M6281 Muscle weakness (generalized): Secondary | ICD-10-CM | POA: Diagnosis not present

## 2022-11-18 DIAGNOSIS — E042 Nontoxic multinodular goiter: Secondary | ICD-10-CM | POA: Diagnosis not present

## 2022-11-18 DIAGNOSIS — R221 Localized swelling, mass and lump, neck: Secondary | ICD-10-CM | POA: Diagnosis not present

## 2022-11-18 DIAGNOSIS — M6281 Muscle weakness (generalized): Secondary | ICD-10-CM | POA: Diagnosis not present

## 2022-11-18 DIAGNOSIS — R1310 Dysphagia, unspecified: Secondary | ICD-10-CM | POA: Diagnosis not present

## 2022-11-19 DIAGNOSIS — R1310 Dysphagia, unspecified: Secondary | ICD-10-CM | POA: Diagnosis not present

## 2022-11-19 DIAGNOSIS — M6281 Muscle weakness (generalized): Secondary | ICD-10-CM | POA: Diagnosis not present

## 2022-11-20 DIAGNOSIS — R1310 Dysphagia, unspecified: Secondary | ICD-10-CM | POA: Diagnosis not present

## 2022-11-20 DIAGNOSIS — M6281 Muscle weakness (generalized): Secondary | ICD-10-CM | POA: Diagnosis not present

## 2022-11-21 DIAGNOSIS — R1310 Dysphagia, unspecified: Secondary | ICD-10-CM | POA: Diagnosis not present

## 2022-11-21 DIAGNOSIS — M6281 Muscle weakness (generalized): Secondary | ICD-10-CM | POA: Diagnosis not present

## 2022-11-22 DIAGNOSIS — M6281 Muscle weakness (generalized): Secondary | ICD-10-CM | POA: Diagnosis not present

## 2022-11-22 DIAGNOSIS — R1310 Dysphagia, unspecified: Secondary | ICD-10-CM | POA: Diagnosis not present

## 2022-11-23 DIAGNOSIS — M6281 Muscle weakness (generalized): Secondary | ICD-10-CM | POA: Diagnosis not present

## 2022-11-23 DIAGNOSIS — R1310 Dysphagia, unspecified: Secondary | ICD-10-CM | POA: Diagnosis not present

## 2022-11-24 DIAGNOSIS — M6281 Muscle weakness (generalized): Secondary | ICD-10-CM | POA: Diagnosis not present

## 2022-11-24 DIAGNOSIS — R1310 Dysphagia, unspecified: Secondary | ICD-10-CM | POA: Diagnosis not present

## 2022-11-25 DIAGNOSIS — M6281 Muscle weakness (generalized): Secondary | ICD-10-CM | POA: Diagnosis not present

## 2022-11-25 DIAGNOSIS — R1310 Dysphagia, unspecified: Secondary | ICD-10-CM | POA: Diagnosis not present

## 2022-11-25 DIAGNOSIS — I77811 Abdominal aortic ectasia: Secondary | ICD-10-CM | POA: Diagnosis not present

## 2022-11-26 DIAGNOSIS — M6281 Muscle weakness (generalized): Secondary | ICD-10-CM | POA: Diagnosis not present

## 2022-11-26 DIAGNOSIS — E782 Mixed hyperlipidemia: Secondary | ICD-10-CM | POA: Diagnosis not present

## 2022-11-26 DIAGNOSIS — D518 Other vitamin B12 deficiency anemias: Secondary | ICD-10-CM | POA: Diagnosis not present

## 2022-11-26 DIAGNOSIS — Z79899 Other long term (current) drug therapy: Secondary | ICD-10-CM | POA: Diagnosis not present

## 2022-11-26 DIAGNOSIS — R1310 Dysphagia, unspecified: Secondary | ICD-10-CM | POA: Diagnosis not present

## 2022-11-26 DIAGNOSIS — E038 Other specified hypothyroidism: Secondary | ICD-10-CM | POA: Diagnosis not present

## 2022-11-27 DIAGNOSIS — I10 Essential (primary) hypertension: Secondary | ICD-10-CM | POA: Diagnosis not present

## 2022-11-27 DIAGNOSIS — R1310 Dysphagia, unspecified: Secondary | ICD-10-CM | POA: Diagnosis not present

## 2022-11-27 DIAGNOSIS — M6281 Muscle weakness (generalized): Secondary | ICD-10-CM | POA: Diagnosis not present

## 2022-11-28 DIAGNOSIS — R1310 Dysphagia, unspecified: Secondary | ICD-10-CM | POA: Diagnosis not present

## 2022-11-28 DIAGNOSIS — M6281 Muscle weakness (generalized): Secondary | ICD-10-CM | POA: Diagnosis not present

## 2022-11-28 DIAGNOSIS — I70223 Atherosclerosis of native arteries of extremities with rest pain, bilateral legs: Secondary | ICD-10-CM | POA: Diagnosis not present

## 2022-11-29 DIAGNOSIS — M6281 Muscle weakness (generalized): Secondary | ICD-10-CM | POA: Diagnosis not present

## 2022-11-29 DIAGNOSIS — R1310 Dysphagia, unspecified: Secondary | ICD-10-CM | POA: Diagnosis not present

## 2022-11-30 DIAGNOSIS — M6281 Muscle weakness (generalized): Secondary | ICD-10-CM | POA: Diagnosis not present

## 2022-11-30 DIAGNOSIS — R1310 Dysphagia, unspecified: Secondary | ICD-10-CM | POA: Diagnosis not present

## 2022-12-01 DIAGNOSIS — M6281 Muscle weakness (generalized): Secondary | ICD-10-CM | POA: Diagnosis not present

## 2022-12-01 DIAGNOSIS — R1310 Dysphagia, unspecified: Secondary | ICD-10-CM | POA: Diagnosis not present

## 2022-12-02 DIAGNOSIS — R590 Localized enlarged lymph nodes: Secondary | ICD-10-CM | POA: Diagnosis not present

## 2022-12-02 DIAGNOSIS — M6281 Muscle weakness (generalized): Secondary | ICD-10-CM | POA: Diagnosis not present

## 2022-12-02 DIAGNOSIS — R1310 Dysphagia, unspecified: Secondary | ICD-10-CM | POA: Diagnosis not present

## 2022-12-03 DIAGNOSIS — I10 Essential (primary) hypertension: Secondary | ICD-10-CM | POA: Diagnosis not present

## 2022-12-03 DIAGNOSIS — I639 Cerebral infarction, unspecified: Secondary | ICD-10-CM | POA: Diagnosis not present

## 2022-12-03 DIAGNOSIS — E785 Hyperlipidemia, unspecified: Secondary | ICD-10-CM | POA: Diagnosis not present

## 2022-12-03 DIAGNOSIS — N400 Enlarged prostate without lower urinary tract symptoms: Secondary | ICD-10-CM | POA: Diagnosis not present

## 2022-12-03 DIAGNOSIS — R2681 Unsteadiness on feet: Secondary | ICD-10-CM | POA: Diagnosis not present

## 2022-12-03 DIAGNOSIS — R1310 Dysphagia, unspecified: Secondary | ICD-10-CM | POA: Diagnosis not present

## 2022-12-03 DIAGNOSIS — D508 Other iron deficiency anemias: Secondary | ICD-10-CM | POA: Diagnosis not present

## 2022-12-03 DIAGNOSIS — M6281 Muscle weakness (generalized): Secondary | ICD-10-CM | POA: Diagnosis not present

## 2022-12-04 DIAGNOSIS — M6281 Muscle weakness (generalized): Secondary | ICD-10-CM | POA: Diagnosis not present

## 2022-12-04 DIAGNOSIS — R1310 Dysphagia, unspecified: Secondary | ICD-10-CM | POA: Diagnosis not present

## 2022-12-05 DIAGNOSIS — M6281 Muscle weakness (generalized): Secondary | ICD-10-CM | POA: Diagnosis not present

## 2022-12-05 DIAGNOSIS — R1310 Dysphagia, unspecified: Secondary | ICD-10-CM | POA: Diagnosis not present

## 2022-12-06 DIAGNOSIS — R1310 Dysphagia, unspecified: Secondary | ICD-10-CM | POA: Diagnosis not present

## 2022-12-06 DIAGNOSIS — R2241 Localized swelling, mass and lump, right lower limb: Secondary | ICD-10-CM | POA: Diagnosis not present

## 2022-12-06 DIAGNOSIS — M6281 Muscle weakness (generalized): Secondary | ICD-10-CM | POA: Diagnosis not present

## 2022-12-07 DIAGNOSIS — R1310 Dysphagia, unspecified: Secondary | ICD-10-CM | POA: Diagnosis not present

## 2022-12-07 DIAGNOSIS — M6281 Muscle weakness (generalized): Secondary | ICD-10-CM | POA: Diagnosis not present

## 2022-12-08 DIAGNOSIS — M6281 Muscle weakness (generalized): Secondary | ICD-10-CM | POA: Diagnosis not present

## 2022-12-08 DIAGNOSIS — R1310 Dysphagia, unspecified: Secondary | ICD-10-CM | POA: Diagnosis not present

## 2022-12-09 DIAGNOSIS — M6281 Muscle weakness (generalized): Secondary | ICD-10-CM | POA: Diagnosis not present

## 2022-12-09 DIAGNOSIS — R1310 Dysphagia, unspecified: Secondary | ICD-10-CM | POA: Diagnosis not present

## 2022-12-10 DIAGNOSIS — R1310 Dysphagia, unspecified: Secondary | ICD-10-CM | POA: Diagnosis not present

## 2022-12-10 DIAGNOSIS — M6281 Muscle weakness (generalized): Secondary | ICD-10-CM | POA: Diagnosis not present

## 2022-12-11 DIAGNOSIS — M6281 Muscle weakness (generalized): Secondary | ICD-10-CM | POA: Diagnosis not present

## 2022-12-11 DIAGNOSIS — R1310 Dysphagia, unspecified: Secondary | ICD-10-CM | POA: Diagnosis not present

## 2022-12-12 DIAGNOSIS — R1310 Dysphagia, unspecified: Secondary | ICD-10-CM | POA: Diagnosis not present

## 2022-12-12 DIAGNOSIS — M6281 Muscle weakness (generalized): Secondary | ICD-10-CM | POA: Diagnosis not present

## 2022-12-13 DIAGNOSIS — M6281 Muscle weakness (generalized): Secondary | ICD-10-CM | POA: Diagnosis not present

## 2022-12-13 DIAGNOSIS — R1310 Dysphagia, unspecified: Secondary | ICD-10-CM | POA: Diagnosis not present

## 2022-12-14 DIAGNOSIS — R1310 Dysphagia, unspecified: Secondary | ICD-10-CM | POA: Diagnosis not present

## 2022-12-14 DIAGNOSIS — M6281 Muscle weakness (generalized): Secondary | ICD-10-CM | POA: Diagnosis not present

## 2022-12-15 DIAGNOSIS — R1310 Dysphagia, unspecified: Secondary | ICD-10-CM | POA: Diagnosis not present

## 2022-12-15 DIAGNOSIS — M6281 Muscle weakness (generalized): Secondary | ICD-10-CM | POA: Diagnosis not present

## 2022-12-16 DIAGNOSIS — R1310 Dysphagia, unspecified: Secondary | ICD-10-CM | POA: Diagnosis not present

## 2022-12-16 DIAGNOSIS — M6281 Muscle weakness (generalized): Secondary | ICD-10-CM | POA: Diagnosis not present

## 2022-12-17 DIAGNOSIS — M6281 Muscle weakness (generalized): Secondary | ICD-10-CM | POA: Diagnosis not present

## 2022-12-17 DIAGNOSIS — R1310 Dysphagia, unspecified: Secondary | ICD-10-CM | POA: Diagnosis not present

## 2022-12-18 DIAGNOSIS — R1310 Dysphagia, unspecified: Secondary | ICD-10-CM | POA: Diagnosis not present

## 2022-12-18 DIAGNOSIS — M6281 Muscle weakness (generalized): Secondary | ICD-10-CM | POA: Diagnosis not present

## 2022-12-19 DIAGNOSIS — R1310 Dysphagia, unspecified: Secondary | ICD-10-CM | POA: Diagnosis not present

## 2022-12-19 DIAGNOSIS — M6281 Muscle weakness (generalized): Secondary | ICD-10-CM | POA: Diagnosis not present

## 2022-12-20 DIAGNOSIS — R1310 Dysphagia, unspecified: Secondary | ICD-10-CM | POA: Diagnosis not present

## 2022-12-20 DIAGNOSIS — M6281 Muscle weakness (generalized): Secondary | ICD-10-CM | POA: Diagnosis not present

## 2022-12-21 DIAGNOSIS — M6281 Muscle weakness (generalized): Secondary | ICD-10-CM | POA: Diagnosis not present

## 2022-12-21 DIAGNOSIS — R1310 Dysphagia, unspecified: Secondary | ICD-10-CM | POA: Diagnosis not present

## 2022-12-22 DIAGNOSIS — R1310 Dysphagia, unspecified: Secondary | ICD-10-CM | POA: Diagnosis not present

## 2022-12-22 DIAGNOSIS — M6281 Muscle weakness (generalized): Secondary | ICD-10-CM | POA: Diagnosis not present

## 2022-12-23 DIAGNOSIS — M6281 Muscle weakness (generalized): Secondary | ICD-10-CM | POA: Diagnosis not present

## 2022-12-23 DIAGNOSIS — R1310 Dysphagia, unspecified: Secondary | ICD-10-CM | POA: Diagnosis not present

## 2022-12-24 DIAGNOSIS — N400 Enlarged prostate without lower urinary tract symptoms: Secondary | ICD-10-CM | POA: Diagnosis not present

## 2022-12-24 DIAGNOSIS — N189 Chronic kidney disease, unspecified: Secondary | ICD-10-CM | POA: Diagnosis not present

## 2022-12-24 DIAGNOSIS — R1310 Dysphagia, unspecified: Secondary | ICD-10-CM | POA: Diagnosis not present

## 2022-12-24 DIAGNOSIS — M6281 Muscle weakness (generalized): Secondary | ICD-10-CM | POA: Diagnosis not present

## 2022-12-24 DIAGNOSIS — I10 Essential (primary) hypertension: Secondary | ICD-10-CM | POA: Diagnosis not present

## 2022-12-24 DIAGNOSIS — E785 Hyperlipidemia, unspecified: Secondary | ICD-10-CM | POA: Diagnosis not present

## 2022-12-24 DIAGNOSIS — D508 Other iron deficiency anemias: Secondary | ICD-10-CM | POA: Diagnosis not present

## 2022-12-25 DIAGNOSIS — R1310 Dysphagia, unspecified: Secondary | ICD-10-CM | POA: Diagnosis not present

## 2022-12-25 DIAGNOSIS — M6281 Muscle weakness (generalized): Secondary | ICD-10-CM | POA: Diagnosis not present

## 2022-12-26 DIAGNOSIS — M6281 Muscle weakness (generalized): Secondary | ICD-10-CM | POA: Diagnosis not present

## 2022-12-26 DIAGNOSIS — D518 Other vitamin B12 deficiency anemias: Secondary | ICD-10-CM | POA: Diagnosis not present

## 2022-12-26 DIAGNOSIS — E038 Other specified hypothyroidism: Secondary | ICD-10-CM | POA: Diagnosis not present

## 2022-12-26 DIAGNOSIS — I639 Cerebral infarction, unspecified: Secondary | ICD-10-CM | POA: Diagnosis not present

## 2022-12-26 DIAGNOSIS — E559 Vitamin D deficiency, unspecified: Secondary | ICD-10-CM | POA: Diagnosis not present

## 2022-12-26 DIAGNOSIS — E119 Type 2 diabetes mellitus without complications: Secondary | ICD-10-CM | POA: Diagnosis not present

## 2022-12-26 DIAGNOSIS — I10 Essential (primary) hypertension: Secondary | ICD-10-CM | POA: Diagnosis not present

## 2022-12-26 DIAGNOSIS — E785 Hyperlipidemia, unspecified: Secondary | ICD-10-CM | POA: Diagnosis not present

## 2022-12-26 DIAGNOSIS — N189 Chronic kidney disease, unspecified: Secondary | ICD-10-CM | POA: Diagnosis not present

## 2022-12-26 DIAGNOSIS — R1310 Dysphagia, unspecified: Secondary | ICD-10-CM | POA: Diagnosis not present

## 2022-12-27 DIAGNOSIS — M6281 Muscle weakness (generalized): Secondary | ICD-10-CM | POA: Diagnosis not present

## 2022-12-27 DIAGNOSIS — R1310 Dysphagia, unspecified: Secondary | ICD-10-CM | POA: Diagnosis not present

## 2022-12-27 DIAGNOSIS — I10 Essential (primary) hypertension: Secondary | ICD-10-CM | POA: Diagnosis not present

## 2022-12-28 DIAGNOSIS — M6281 Muscle weakness (generalized): Secondary | ICD-10-CM | POA: Diagnosis not present

## 2022-12-28 DIAGNOSIS — R1310 Dysphagia, unspecified: Secondary | ICD-10-CM | POA: Diagnosis not present

## 2022-12-29 DIAGNOSIS — R1310 Dysphagia, unspecified: Secondary | ICD-10-CM | POA: Diagnosis not present

## 2022-12-29 DIAGNOSIS — M6281 Muscle weakness (generalized): Secondary | ICD-10-CM | POA: Diagnosis not present

## 2022-12-30 DIAGNOSIS — M6281 Muscle weakness (generalized): Secondary | ICD-10-CM | POA: Diagnosis not present

## 2022-12-30 DIAGNOSIS — R1310 Dysphagia, unspecified: Secondary | ICD-10-CM | POA: Diagnosis not present

## 2022-12-31 DIAGNOSIS — M6281 Muscle weakness (generalized): Secondary | ICD-10-CM | POA: Diagnosis not present

## 2022-12-31 DIAGNOSIS — R1310 Dysphagia, unspecified: Secondary | ICD-10-CM | POA: Diagnosis not present

## 2023-01-01 DIAGNOSIS — M6281 Muscle weakness (generalized): Secondary | ICD-10-CM | POA: Diagnosis not present

## 2023-01-01 DIAGNOSIS — R1310 Dysphagia, unspecified: Secondary | ICD-10-CM | POA: Diagnosis not present

## 2023-01-02 DIAGNOSIS — R1310 Dysphagia, unspecified: Secondary | ICD-10-CM | POA: Diagnosis not present

## 2023-01-02 DIAGNOSIS — M6281 Muscle weakness (generalized): Secondary | ICD-10-CM | POA: Diagnosis not present

## 2023-01-03 DIAGNOSIS — M6281 Muscle weakness (generalized): Secondary | ICD-10-CM | POA: Diagnosis not present

## 2023-01-03 DIAGNOSIS — R1310 Dysphagia, unspecified: Secondary | ICD-10-CM | POA: Diagnosis not present

## 2023-01-04 DIAGNOSIS — M6281 Muscle weakness (generalized): Secondary | ICD-10-CM | POA: Diagnosis not present

## 2023-01-04 DIAGNOSIS — R1310 Dysphagia, unspecified: Secondary | ICD-10-CM | POA: Diagnosis not present

## 2023-01-05 DIAGNOSIS — M6281 Muscle weakness (generalized): Secondary | ICD-10-CM | POA: Diagnosis not present

## 2023-01-05 DIAGNOSIS — R1310 Dysphagia, unspecified: Secondary | ICD-10-CM | POA: Diagnosis not present

## 2023-01-06 DIAGNOSIS — M6281 Muscle weakness (generalized): Secondary | ICD-10-CM | POA: Diagnosis not present

## 2023-01-06 DIAGNOSIS — R1310 Dysphagia, unspecified: Secondary | ICD-10-CM | POA: Diagnosis not present

## 2023-01-07 DIAGNOSIS — R1310 Dysphagia, unspecified: Secondary | ICD-10-CM | POA: Diagnosis not present

## 2023-01-07 DIAGNOSIS — M6281 Muscle weakness (generalized): Secondary | ICD-10-CM | POA: Diagnosis not present

## 2023-01-08 DIAGNOSIS — R1310 Dysphagia, unspecified: Secondary | ICD-10-CM | POA: Diagnosis not present

## 2023-01-08 DIAGNOSIS — M6281 Muscle weakness (generalized): Secondary | ICD-10-CM | POA: Diagnosis not present

## 2023-01-09 DIAGNOSIS — M6281 Muscle weakness (generalized): Secondary | ICD-10-CM | POA: Diagnosis not present

## 2023-01-09 DIAGNOSIS — R1310 Dysphagia, unspecified: Secondary | ICD-10-CM | POA: Diagnosis not present

## 2023-01-10 DIAGNOSIS — M6281 Muscle weakness (generalized): Secondary | ICD-10-CM | POA: Diagnosis not present

## 2023-01-10 DIAGNOSIS — R1310 Dysphagia, unspecified: Secondary | ICD-10-CM | POA: Diagnosis not present

## 2023-01-11 DIAGNOSIS — M6281 Muscle weakness (generalized): Secondary | ICD-10-CM | POA: Diagnosis not present

## 2023-01-11 DIAGNOSIS — R1310 Dysphagia, unspecified: Secondary | ICD-10-CM | POA: Diagnosis not present

## 2023-01-12 DIAGNOSIS — M6281 Muscle weakness (generalized): Secondary | ICD-10-CM | POA: Diagnosis not present

## 2023-01-12 DIAGNOSIS — R1310 Dysphagia, unspecified: Secondary | ICD-10-CM | POA: Diagnosis not present

## 2023-01-13 DIAGNOSIS — M6281 Muscle weakness (generalized): Secondary | ICD-10-CM | POA: Diagnosis not present

## 2023-01-13 DIAGNOSIS — R1310 Dysphagia, unspecified: Secondary | ICD-10-CM | POA: Diagnosis not present

## 2023-01-14 DIAGNOSIS — R1310 Dysphagia, unspecified: Secondary | ICD-10-CM | POA: Diagnosis not present

## 2023-01-14 DIAGNOSIS — M6281 Muscle weakness (generalized): Secondary | ICD-10-CM | POA: Diagnosis not present

## 2023-01-15 DIAGNOSIS — E559 Vitamin D deficiency, unspecified: Secondary | ICD-10-CM | POA: Diagnosis not present

## 2023-01-15 DIAGNOSIS — D518 Other vitamin B12 deficiency anemias: Secondary | ICD-10-CM | POA: Diagnosis not present

## 2023-01-15 DIAGNOSIS — N400 Enlarged prostate without lower urinary tract symptoms: Secondary | ICD-10-CM | POA: Diagnosis not present

## 2023-01-15 DIAGNOSIS — R1310 Dysphagia, unspecified: Secondary | ICD-10-CM | POA: Diagnosis not present

## 2023-01-15 DIAGNOSIS — M6281 Muscle weakness (generalized): Secondary | ICD-10-CM | POA: Diagnosis not present

## 2023-01-15 DIAGNOSIS — I10 Essential (primary) hypertension: Secondary | ICD-10-CM | POA: Diagnosis not present

## 2023-01-15 DIAGNOSIS — I639 Cerebral infarction, unspecified: Secondary | ICD-10-CM | POA: Diagnosis not present

## 2023-01-15 DIAGNOSIS — E119 Type 2 diabetes mellitus without complications: Secondary | ICD-10-CM | POA: Diagnosis not present

## 2023-01-15 DIAGNOSIS — E038 Other specified hypothyroidism: Secondary | ICD-10-CM | POA: Diagnosis not present

## 2023-01-15 DIAGNOSIS — N189 Chronic kidney disease, unspecified: Secondary | ICD-10-CM | POA: Diagnosis not present

## 2023-01-15 DIAGNOSIS — E785 Hyperlipidemia, unspecified: Secondary | ICD-10-CM | POA: Diagnosis not present

## 2023-01-16 DIAGNOSIS — M6281 Muscle weakness (generalized): Secondary | ICD-10-CM | POA: Diagnosis not present

## 2023-01-16 DIAGNOSIS — R1310 Dysphagia, unspecified: Secondary | ICD-10-CM | POA: Diagnosis not present

## 2023-01-17 DIAGNOSIS — R1310 Dysphagia, unspecified: Secondary | ICD-10-CM | POA: Diagnosis not present

## 2023-01-17 DIAGNOSIS — M6281 Muscle weakness (generalized): Secondary | ICD-10-CM | POA: Diagnosis not present

## 2023-01-18 DIAGNOSIS — R1310 Dysphagia, unspecified: Secondary | ICD-10-CM | POA: Diagnosis not present

## 2023-01-18 DIAGNOSIS — M6281 Muscle weakness (generalized): Secondary | ICD-10-CM | POA: Diagnosis not present

## 2023-01-19 DIAGNOSIS — M6281 Muscle weakness (generalized): Secondary | ICD-10-CM | POA: Diagnosis not present

## 2023-01-19 DIAGNOSIS — R1310 Dysphagia, unspecified: Secondary | ICD-10-CM | POA: Diagnosis not present

## 2023-01-20 DIAGNOSIS — R1310 Dysphagia, unspecified: Secondary | ICD-10-CM | POA: Diagnosis not present

## 2023-01-20 DIAGNOSIS — M6281 Muscle weakness (generalized): Secondary | ICD-10-CM | POA: Diagnosis not present

## 2023-01-21 DIAGNOSIS — M6281 Muscle weakness (generalized): Secondary | ICD-10-CM | POA: Diagnosis not present

## 2023-01-21 DIAGNOSIS — R1310 Dysphagia, unspecified: Secondary | ICD-10-CM | POA: Diagnosis not present

## 2023-01-22 DIAGNOSIS — M6281 Muscle weakness (generalized): Secondary | ICD-10-CM | POA: Diagnosis not present

## 2023-01-22 DIAGNOSIS — R1310 Dysphagia, unspecified: Secondary | ICD-10-CM | POA: Diagnosis not present

## 2023-01-23 DIAGNOSIS — R1310 Dysphagia, unspecified: Secondary | ICD-10-CM | POA: Diagnosis not present

## 2023-01-23 DIAGNOSIS — M6281 Muscle weakness (generalized): Secondary | ICD-10-CM | POA: Diagnosis not present

## 2023-01-24 DIAGNOSIS — R1310 Dysphagia, unspecified: Secondary | ICD-10-CM | POA: Diagnosis not present

## 2023-01-24 DIAGNOSIS — M6281 Muscle weakness (generalized): Secondary | ICD-10-CM | POA: Diagnosis not present

## 2023-01-25 DIAGNOSIS — M6281 Muscle weakness (generalized): Secondary | ICD-10-CM | POA: Diagnosis not present

## 2023-01-25 DIAGNOSIS — R1310 Dysphagia, unspecified: Secondary | ICD-10-CM | POA: Diagnosis not present

## 2023-01-26 DIAGNOSIS — M6281 Muscle weakness (generalized): Secondary | ICD-10-CM | POA: Diagnosis not present

## 2023-01-26 DIAGNOSIS — R1310 Dysphagia, unspecified: Secondary | ICD-10-CM | POA: Diagnosis not present

## 2023-01-27 DIAGNOSIS — R1310 Dysphagia, unspecified: Secondary | ICD-10-CM | POA: Diagnosis not present

## 2023-01-27 DIAGNOSIS — I10 Essential (primary) hypertension: Secondary | ICD-10-CM | POA: Diagnosis not present

## 2023-01-27 DIAGNOSIS — M6281 Muscle weakness (generalized): Secondary | ICD-10-CM | POA: Diagnosis not present

## 2023-01-28 DIAGNOSIS — E785 Hyperlipidemia, unspecified: Secondary | ICD-10-CM | POA: Diagnosis not present

## 2023-01-28 DIAGNOSIS — N189 Chronic kidney disease, unspecified: Secondary | ICD-10-CM | POA: Diagnosis not present

## 2023-01-28 DIAGNOSIS — N400 Enlarged prostate without lower urinary tract symptoms: Secondary | ICD-10-CM | POA: Diagnosis not present

## 2023-01-28 DIAGNOSIS — I10 Essential (primary) hypertension: Secondary | ICD-10-CM | POA: Diagnosis not present

## 2023-01-28 DIAGNOSIS — D508 Other iron deficiency anemias: Secondary | ICD-10-CM | POA: Diagnosis not present

## 2023-01-28 DIAGNOSIS — R2681 Unsteadiness on feet: Secondary | ICD-10-CM | POA: Diagnosis not present

## 2023-01-29 DIAGNOSIS — M79672 Pain in left foot: Secondary | ICD-10-CM | POA: Diagnosis not present

## 2023-01-29 DIAGNOSIS — M79671 Pain in right foot: Secondary | ICD-10-CM | POA: Diagnosis not present

## 2023-01-29 DIAGNOSIS — B351 Tinea unguium: Secondary | ICD-10-CM | POA: Diagnosis not present

## 2023-01-29 DIAGNOSIS — L600 Ingrowing nail: Secondary | ICD-10-CM | POA: Diagnosis not present

## 2023-02-10 DIAGNOSIS — D518 Other vitamin B12 deficiency anemias: Secondary | ICD-10-CM | POA: Diagnosis not present

## 2023-02-10 DIAGNOSIS — Z79899 Other long term (current) drug therapy: Secondary | ICD-10-CM | POA: Diagnosis not present

## 2023-02-10 DIAGNOSIS — E038 Other specified hypothyroidism: Secondary | ICD-10-CM | POA: Diagnosis not present

## 2023-02-10 DIAGNOSIS — E119 Type 2 diabetes mellitus without complications: Secondary | ICD-10-CM | POA: Diagnosis not present

## 2023-02-10 DIAGNOSIS — E782 Mixed hyperlipidemia: Secondary | ICD-10-CM | POA: Diagnosis not present

## 2023-02-20 DIAGNOSIS — D518 Other vitamin B12 deficiency anemias: Secondary | ICD-10-CM | POA: Diagnosis not present

## 2023-02-20 DIAGNOSIS — N400 Enlarged prostate without lower urinary tract symptoms: Secondary | ICD-10-CM | POA: Diagnosis not present

## 2023-02-20 DIAGNOSIS — E785 Hyperlipidemia, unspecified: Secondary | ICD-10-CM | POA: Diagnosis not present

## 2023-02-20 DIAGNOSIS — E038 Other specified hypothyroidism: Secondary | ICD-10-CM | POA: Diagnosis not present

## 2023-02-20 DIAGNOSIS — E559 Vitamin D deficiency, unspecified: Secondary | ICD-10-CM | POA: Diagnosis not present

## 2023-02-20 DIAGNOSIS — I10 Essential (primary) hypertension: Secondary | ICD-10-CM | POA: Diagnosis not present

## 2023-02-20 DIAGNOSIS — E119 Type 2 diabetes mellitus without complications: Secondary | ICD-10-CM | POA: Diagnosis not present

## 2023-02-20 DIAGNOSIS — E782 Mixed hyperlipidemia: Secondary | ICD-10-CM | POA: Diagnosis not present

## 2023-02-20 DIAGNOSIS — I639 Cerebral infarction, unspecified: Secondary | ICD-10-CM | POA: Diagnosis not present

## 2023-02-20 DIAGNOSIS — N189 Chronic kidney disease, unspecified: Secondary | ICD-10-CM | POA: Diagnosis not present

## 2023-02-25 DIAGNOSIS — I10 Essential (primary) hypertension: Secondary | ICD-10-CM | POA: Diagnosis not present

## 2023-02-25 DIAGNOSIS — D508 Other iron deficiency anemias: Secondary | ICD-10-CM | POA: Diagnosis not present

## 2023-02-25 DIAGNOSIS — N400 Enlarged prostate without lower urinary tract symptoms: Secondary | ICD-10-CM | POA: Diagnosis not present

## 2023-02-25 DIAGNOSIS — E785 Hyperlipidemia, unspecified: Secondary | ICD-10-CM | POA: Diagnosis not present

## 2023-02-25 DIAGNOSIS — I639 Cerebral infarction, unspecified: Secondary | ICD-10-CM | POA: Diagnosis not present

## 2023-02-25 DIAGNOSIS — N189 Chronic kidney disease, unspecified: Secondary | ICD-10-CM | POA: Diagnosis not present

## 2023-02-27 DIAGNOSIS — I10 Essential (primary) hypertension: Secondary | ICD-10-CM | POA: Diagnosis not present

## 2023-03-14 DIAGNOSIS — I10 Essential (primary) hypertension: Secondary | ICD-10-CM | POA: Diagnosis not present

## 2023-03-14 DIAGNOSIS — E038 Other specified hypothyroidism: Secondary | ICD-10-CM | POA: Diagnosis not present

## 2023-03-14 DIAGNOSIS — N400 Enlarged prostate without lower urinary tract symptoms: Secondary | ICD-10-CM | POA: Diagnosis not present

## 2023-03-14 DIAGNOSIS — I70223 Atherosclerosis of native arteries of extremities with rest pain, bilateral legs: Secondary | ICD-10-CM | POA: Diagnosis not present

## 2023-03-14 DIAGNOSIS — D518 Other vitamin B12 deficiency anemias: Secondary | ICD-10-CM | POA: Diagnosis not present

## 2023-03-14 DIAGNOSIS — I639 Cerebral infarction, unspecified: Secondary | ICD-10-CM | POA: Diagnosis not present

## 2023-03-14 DIAGNOSIS — E785 Hyperlipidemia, unspecified: Secondary | ICD-10-CM | POA: Diagnosis not present

## 2023-03-14 DIAGNOSIS — N189 Chronic kidney disease, unspecified: Secondary | ICD-10-CM | POA: Diagnosis not present

## 2023-03-14 DIAGNOSIS — E559 Vitamin D deficiency, unspecified: Secondary | ICD-10-CM | POA: Diagnosis not present

## 2023-03-14 DIAGNOSIS — E119 Type 2 diabetes mellitus without complications: Secondary | ICD-10-CM | POA: Diagnosis not present

## 2023-03-25 DIAGNOSIS — E785 Hyperlipidemia, unspecified: Secondary | ICD-10-CM | POA: Diagnosis not present

## 2023-03-25 DIAGNOSIS — N189 Chronic kidney disease, unspecified: Secondary | ICD-10-CM | POA: Diagnosis not present

## 2023-03-25 DIAGNOSIS — N400 Enlarged prostate without lower urinary tract symptoms: Secondary | ICD-10-CM | POA: Diagnosis not present

## 2023-03-25 DIAGNOSIS — I639 Cerebral infarction, unspecified: Secondary | ICD-10-CM | POA: Diagnosis not present

## 2023-03-25 DIAGNOSIS — I10 Essential (primary) hypertension: Secondary | ICD-10-CM | POA: Diagnosis not present

## 2023-04-03 DIAGNOSIS — E559 Vitamin D deficiency, unspecified: Secondary | ICD-10-CM | POA: Diagnosis not present

## 2023-04-03 DIAGNOSIS — E119 Type 2 diabetes mellitus without complications: Secondary | ICD-10-CM | POA: Diagnosis not present

## 2023-04-03 DIAGNOSIS — I10 Essential (primary) hypertension: Secondary | ICD-10-CM | POA: Diagnosis not present

## 2023-04-03 DIAGNOSIS — D518 Other vitamin B12 deficiency anemias: Secondary | ICD-10-CM | POA: Diagnosis not present

## 2023-04-03 DIAGNOSIS — E785 Hyperlipidemia, unspecified: Secondary | ICD-10-CM | POA: Diagnosis not present

## 2023-04-03 DIAGNOSIS — N189 Chronic kidney disease, unspecified: Secondary | ICD-10-CM | POA: Diagnosis not present

## 2023-04-03 DIAGNOSIS — N400 Enlarged prostate without lower urinary tract symptoms: Secondary | ICD-10-CM | POA: Diagnosis not present

## 2023-04-03 DIAGNOSIS — I70223 Atherosclerosis of native arteries of extremities with rest pain, bilateral legs: Secondary | ICD-10-CM | POA: Diagnosis not present

## 2023-04-03 DIAGNOSIS — E038 Other specified hypothyroidism: Secondary | ICD-10-CM | POA: Diagnosis not present

## 2023-04-03 DIAGNOSIS — I639 Cerebral infarction, unspecified: Secondary | ICD-10-CM | POA: Diagnosis not present

## 2023-04-11 ENCOUNTER — Inpatient Hospital Stay
Admission: EM | Admit: 2023-04-11 | Discharge: 2023-04-16 | DRG: 690 | Disposition: A | Discharge status: Nursing Home (Includes ICF) | Payer: Medicare HMO | Source: Skilled Nursing Facility | Attending: Internal Medicine | Admitting: Internal Medicine

## 2023-04-11 ENCOUNTER — Emergency Department: Payer: Medicare HMO

## 2023-04-11 ENCOUNTER — Other Ambulatory Visit: Payer: Self-pay

## 2023-04-11 DIAGNOSIS — Z7902 Long term (current) use of antithrombotics/antiplatelets: Secondary | ICD-10-CM

## 2023-04-11 DIAGNOSIS — J9811 Atelectasis: Secondary | ICD-10-CM | POA: Diagnosis present

## 2023-04-11 DIAGNOSIS — Z882 Allergy status to sulfonamides status: Secondary | ICD-10-CM

## 2023-04-11 DIAGNOSIS — Z1623 Resistance to quinolones and fluoroquinolones: Secondary | ICD-10-CM | POA: Diagnosis present

## 2023-04-11 DIAGNOSIS — D649 Anemia, unspecified: Secondary | ICD-10-CM | POA: Diagnosis not present

## 2023-04-11 DIAGNOSIS — Z87442 Personal history of urinary calculi: Secondary | ICD-10-CM

## 2023-04-11 DIAGNOSIS — B9620 Unspecified Escherichia coli [E. coli] as the cause of diseases classified elsewhere: Secondary | ICD-10-CM | POA: Diagnosis present

## 2023-04-11 DIAGNOSIS — Z7982 Long term (current) use of aspirin: Secondary | ICD-10-CM

## 2023-04-11 DIAGNOSIS — N400 Enlarged prostate without lower urinary tract symptoms: Secondary | ICD-10-CM | POA: Diagnosis present

## 2023-04-11 DIAGNOSIS — N189 Chronic kidney disease, unspecified: Secondary | ICD-10-CM | POA: Diagnosis present

## 2023-04-11 DIAGNOSIS — I639 Cerebral infarction, unspecified: Secondary | ICD-10-CM

## 2023-04-11 DIAGNOSIS — Z886 Allergy status to analgesic agent status: Secondary | ICD-10-CM

## 2023-04-11 DIAGNOSIS — Z1611 Resistance to penicillins: Secondary | ICD-10-CM | POA: Diagnosis present

## 2023-04-11 DIAGNOSIS — N1832 Chronic kidney disease, stage 3b: Secondary | ICD-10-CM

## 2023-04-11 DIAGNOSIS — I739 Peripheral vascular disease, unspecified: Secondary | ICD-10-CM | POA: Diagnosis present

## 2023-04-11 DIAGNOSIS — Z8249 Family history of ischemic heart disease and other diseases of the circulatory system: Secondary | ICD-10-CM

## 2023-04-11 DIAGNOSIS — S199XXA Unspecified injury of neck, initial encounter: Secondary | ICD-10-CM | POA: Diagnosis not present

## 2023-04-11 DIAGNOSIS — M6281 Muscle weakness (generalized): Secondary | ICD-10-CM | POA: Diagnosis not present

## 2023-04-11 DIAGNOSIS — Z7952 Long term (current) use of systemic steroids: Secondary | ICD-10-CM

## 2023-04-11 DIAGNOSIS — R410 Disorientation, unspecified: Secondary | ICD-10-CM

## 2023-04-11 DIAGNOSIS — R0902 Hypoxemia: Secondary | ICD-10-CM | POA: Diagnosis not present

## 2023-04-11 DIAGNOSIS — I129 Hypertensive chronic kidney disease with stage 1 through stage 4 chronic kidney disease, or unspecified chronic kidney disease: Secondary | ICD-10-CM | POA: Diagnosis present

## 2023-04-11 DIAGNOSIS — Z79899 Other long term (current) drug therapy: Secondary | ICD-10-CM

## 2023-04-11 DIAGNOSIS — I10 Essential (primary) hypertension: Secondary | ICD-10-CM | POA: Diagnosis not present

## 2023-04-11 DIAGNOSIS — R4182 Altered mental status, unspecified: Secondary | ICD-10-CM

## 2023-04-11 DIAGNOSIS — Z993 Dependence on wheelchair: Secondary | ICD-10-CM

## 2023-04-11 DIAGNOSIS — R531 Weakness: Secondary | ICD-10-CM | POA: Diagnosis not present

## 2023-04-11 DIAGNOSIS — Z87891 Personal history of nicotine dependence: Secondary | ICD-10-CM

## 2023-04-11 DIAGNOSIS — E785 Hyperlipidemia, unspecified: Secondary | ICD-10-CM | POA: Diagnosis present

## 2023-04-11 DIAGNOSIS — N3001 Acute cystitis with hematuria: Secondary | ICD-10-CM | POA: Diagnosis not present

## 2023-04-11 DIAGNOSIS — N390 Urinary tract infection, site not specified: Principal | ICD-10-CM | POA: Diagnosis present

## 2023-04-11 DIAGNOSIS — D631 Anemia in chronic kidney disease: Secondary | ICD-10-CM | POA: Diagnosis present

## 2023-04-11 DIAGNOSIS — Z8673 Personal history of transient ischemic attack (TIA), and cerebral infarction without residual deficits: Secondary | ICD-10-CM

## 2023-04-11 DIAGNOSIS — R1310 Dysphagia, unspecified: Secondary | ICD-10-CM | POA: Diagnosis present

## 2023-04-11 DIAGNOSIS — Z743 Need for continuous supervision: Secondary | ICD-10-CM | POA: Diagnosis not present

## 2023-04-11 DIAGNOSIS — I700 Atherosclerosis of aorta: Secondary | ICD-10-CM | POA: Diagnosis not present

## 2023-04-11 DIAGNOSIS — Z8546 Personal history of malignant neoplasm of prostate: Secondary | ICD-10-CM

## 2023-04-11 LAB — COMPREHENSIVE METABOLIC PANEL
ALT: 11 U/L (ref 0–44)
AST: 29 U/L (ref 15–41)
Albumin: 3.5 g/dL (ref 3.5–5.0)
Alkaline Phosphatase: 62 U/L (ref 38–126)
Anion gap: 9 (ref 5–15)
BUN: 36 mg/dL — ABNORMAL HIGH (ref 8–23)
CO2: 17 mmol/L — ABNORMAL LOW (ref 22–32)
Calcium: 8.8 mg/dL — ABNORMAL LOW (ref 8.9–10.3)
Chloride: 107 mmol/L (ref 98–111)
Creatinine, Ser: 1.84 mg/dL — ABNORMAL HIGH (ref 0.61–1.24)
GFR, Estimated: 34 mL/min — ABNORMAL LOW (ref >60)
Glucose, Bld: 97 mg/dL (ref 70–99)
Potassium: 5.8 mmol/L — ABNORMAL HIGH (ref 3.5–5.1)
Sodium: 133 mmol/L — ABNORMAL LOW (ref 135–145)
Total Bilirubin: 1.6 mg/dL — ABNORMAL HIGH (ref 0.3–1.2)
Total Protein: 7.3 g/dL (ref 6.5–8.1)

## 2023-04-11 LAB — CBC WITH DIFFERENTIAL/PLATELET
Abs Immature Granulocytes: 0.03 10*3/uL (ref 0.00–0.07)
Basophils Absolute: 0.1 10*3/uL (ref 0.0–0.1)
Basophils Relative: 1 %
Eosinophils Absolute: 0 10*3/uL (ref 0.0–0.5)
Eosinophils Relative: 0 %
HCT: 31.8 % — ABNORMAL LOW (ref 39.0–52.0)
Hemoglobin: 9.7 g/dL — ABNORMAL LOW (ref 13.0–17.0)
Immature Granulocytes: 0 %
Lymphocytes Relative: 10 %
Lymphs Abs: 1 10*3/uL (ref 0.7–4.0)
MCH: 27.9 pg (ref 26.0–34.0)
MCHC: 30.5 g/dL (ref 30.0–36.0)
MCV: 91.4 fL (ref 80.0–100.0)
Monocytes Absolute: 1.3 10*3/uL — ABNORMAL HIGH (ref 0.1–1.0)
Monocytes Relative: 13 %
Neutro Abs: 7.6 10*3/uL (ref 1.7–7.7)
Neutrophils Relative %: 76 %
Platelets: 349 10*3/uL (ref 150–400)
RBC: 3.48 MIL/uL — ABNORMAL LOW (ref 4.22–5.81)
RDW: 17.2 % — ABNORMAL HIGH (ref 11.5–15.5)
WBC: 10 10*3/uL (ref 4.0–10.5)
nRBC: 0 % (ref 0.0–0.2)

## 2023-04-11 LAB — TROPONIN I (HIGH SENSITIVITY): Troponin I (High Sensitivity): 12 ng/L (ref <18)

## 2023-04-11 LAB — URINALYSIS, ROUTINE W REFLEX MICROSCOPIC
Bilirubin Urine: NEGATIVE
Glucose, UA: NEGATIVE mg/dL
Ketones, ur: NEGATIVE mg/dL
Nitrite: NEGATIVE
Protein, ur: 30 mg/dL — AB
Specific Gravity, Urine: 1.012 (ref 1.005–1.030)
WBC, UA: >50 WBC/hpf (ref 0–5)
pH: 5 (ref 5.0–8.0)

## 2023-04-11 LAB — MRSA NEXT GEN BY PCR, NASAL: MRSA by PCR Next Gen: NOT DETECTED

## 2023-04-11 MED ORDER — ACETAMINOPHEN 325 MG PO TABS: 650.0000 mg | ORAL_TABLET | Freq: Four times a day (QID) | ORAL | Status: DC | PRN

## 2023-04-11 MED ORDER — FINASTERIDE 5 MG PO TABS
5.0000 mg | ORAL_TABLET | Freq: Every day | ORAL | Status: DC
  Administered 2023-04-12 – 2023-04-16 (×5): 5 mg via ORAL
  Filled 2023-04-11 (×5): qty 1

## 2023-04-11 MED ORDER — SODIUM CHLORIDE 0.9% FLUSH
3.0000 mL | Freq: Two times a day (BID) | INTRAVENOUS | Status: DC
  Administered 2023-04-11 – 2023-04-16 (×10): 3 mL via INTRAVENOUS

## 2023-04-11 MED ORDER — POLYETHYLENE GLYCOL 3350 17 G PO PACK: 17.0000 g | PACK | Freq: Every day | ORAL | Status: DC | PRN

## 2023-04-11 MED ORDER — SODIUM CHLORIDE 0.9 % IV BOLUS
500.0000 mL | Freq: Once | INTRAVENOUS | Status: AC
  Administered 2023-04-11: 500 mL via INTRAVENOUS

## 2023-04-11 MED ORDER — ACETAMINOPHEN 650 MG RE SUPP: 650.0000 mg | Freq: Four times a day (QID) | RECTAL | Status: DC | PRN

## 2023-04-11 MED ORDER — ASPIRIN 81 MG PO TBEC
81.0000 mg | DELAYED_RELEASE_TABLET | Freq: Every day | ORAL | Status: DC
  Administered 2023-04-12 – 2023-04-16 (×5): 81 mg via ORAL
  Filled 2023-04-11 (×5): qty 1

## 2023-04-11 MED ORDER — FERROUS SULFATE 325 (65 FE) MG PO TABS
325.0000 mg | ORAL_TABLET | Freq: Every day | ORAL | Status: DC
  Administered 2023-04-12 – 2023-04-16 (×5): 325 mg via ORAL
  Filled 2023-04-11 (×5): qty 1

## 2023-04-11 MED ORDER — ENOXAPARIN SODIUM 30 MG/0.3ML IJ SOSY
30.0000 mg | PREFILLED_SYRINGE | INTRAMUSCULAR | Status: DC
  Administered 2023-04-11 – 2023-04-15 (×5): 30 mg via SUBCUTANEOUS
  Filled 2023-04-11 (×5): qty 0.3

## 2023-04-11 MED ORDER — LISINOPRIL 20 MG PO TABS
40.0000 mg | ORAL_TABLET | Freq: Every day | ORAL | Status: DC
  Administered 2023-04-12 – 2023-04-16 (×5): 40 mg via ORAL
  Filled 2023-04-11 (×5): qty 2

## 2023-04-11 MED ORDER — ONDANSETRON HCL 4 MG/2ML IJ SOLN: 4.0000 mg | Freq: Four times a day (QID) | INTRAMUSCULAR | Status: DC | PRN

## 2023-04-11 MED ORDER — ATORVASTATIN CALCIUM 20 MG PO TABS
40.0000 mg | ORAL_TABLET | Freq: Every day | ORAL | Status: DC
  Administered 2023-04-12 – 2023-04-16 (×5): 40 mg via ORAL
  Filled 2023-04-11 (×2): qty 2
  Filled 2023-04-11: qty 4
  Filled 2023-04-11 (×2): qty 2

## 2023-04-11 MED ORDER — LATANOPROST 0.005 % OP SOLN
1.0000 [drp] | Freq: Every day | OPHTHALMIC | Status: DC
  Administered 2023-04-11 – 2023-04-15 (×5): 1 [drp] via OPHTHALMIC
  Filled 2023-04-11: qty 2.5

## 2023-04-11 MED ORDER — METOPROLOL TARTRATE 25 MG PO TABS
25.0000 mg | ORAL_TABLET | Freq: Two times a day (BID) | ORAL | Status: DC
  Administered 2023-04-11 – 2023-04-16 (×10): 25 mg via ORAL
  Filled 2023-04-11 (×10): qty 1

## 2023-04-11 MED ORDER — SODIUM CHLORIDE 0.9 % IV SOLN
1.0000 g | INTRAVENOUS | Status: DC
  Administered 2023-04-12 – 2023-04-13 (×2): 1 g via INTRAVENOUS
  Filled 2023-04-11 (×2): qty 10
  Filled 2023-04-11: qty 1

## 2023-04-11 MED ORDER — TAMSULOSIN HCL 0.4 MG PO CAPS
0.4000 mg | ORAL_CAPSULE | Freq: Every day | ORAL | Status: DC
  Administered 2023-04-12 – 2023-04-16 (×5): 0.4 mg via ORAL
  Filled 2023-04-11 (×5): qty 1

## 2023-04-11 MED ORDER — SODIUM CHLORIDE 0.9 % IV SOLN
1.0000 g | INTRAVENOUS | Status: AC
  Administered 2023-04-11: 1 g via INTRAVENOUS
  Filled 2023-04-11: qty 10

## 2023-04-11 MED ORDER — ONDANSETRON HCL 4 MG PO TABS: 4.0000 mg | ORAL_TABLET | Freq: Four times a day (QID) | ORAL | Status: DC | PRN

## 2023-04-11 MED ORDER — CLOPIDOGREL BISULFATE 75 MG PO TABS
75.0000 mg | ORAL_TABLET | Freq: Every day | ORAL | Status: DC
  Administered 2023-04-12 – 2023-04-16 (×5): 75 mg via ORAL
  Filled 2023-04-11 (×5): qty 1

## 2023-04-11 NOTE — ED Notes (Signed)
Pt cleaned up of BM. Brief changed. Linens changed.

## 2023-04-11 NOTE — Assessment & Plan Note (Signed)
Patient is presenting with several day history of increasing confusion and generalized weakness with urinalysis concerning for UTI.  Previously cultures grew E. coli with resistance to ampicillin, Augmentin and ciprofloxacin.  - Urine culture pending - Continue Rocephin 1 g daily

## 2023-04-11 NOTE — Assessment & Plan Note (Signed)
Continue home antihypertensives 

## 2023-04-11 NOTE — Assessment & Plan Note (Signed)
Per billing records from the [facility], patient is being treated for unspecified dysphagia.  CT of the chest obtained demonstrates dependent areas of groundglass attenuation in the upper lobes bilaterally right greater than left.  Likely due to atelectasis, however chronic silent aspiration possible well.  Given patient is asymptomatic with no other symptoms to suggest pneumonia, will hold off on treatment.  - SLP evaluation

## 2023-04-11 NOTE — Assessment & Plan Note (Signed)
-   Continue home tamsulosin and finasteride - Bladder scan

## 2023-04-11 NOTE — Assessment & Plan Note (Signed)
Chronic anemia secondary to chronic kidney disease and iron deficiency.  No evidence of bleeding on examination.

## 2023-04-11 NOTE — Assessment & Plan Note (Addendum)
Per chart review, patient is wheelchair dependent secondary to generalized weakness.  Acute worsening at this time as patient was previously able to transfer independently.  Given lack of focal deficits, low suspicion for CVA.  CT head and C-spine negative  -Treatment of UTI as noted above - PT/OT

## 2023-04-11 NOTE — Assessment & Plan Note (Signed)
Per billing records from [facility], patient has a history of CVA. No records in Epic to confirm. Given no records regarding cardiac history, this is the most likely reason for DAPT therapy. Unable to reach family to clarify.   - Continue DAPT and statin

## 2023-04-11 NOTE — H&P (Addendum)
History and Physical    Patient: Jon Smith OVA:[id number] DOB: 11/27/1928 DOA: 04/11/2023 DOS: the patient was seen and examined on 04/11/2023 PCP: [facility]  Patient coming from: ALF/ILF  Chief Complaint:  Chief Complaint  Patient presents with   Fall   Weakness   HPI: Jon Smith is a 89 y.o. male with medical history significant of CKD, hypertension, hyperlipidemia, prostate cancer s/p cryotherapy, nephrolithiasis, iron deficiency anemia, chronic muscle weakness currently wheelchair-bound who presents to the ED with weakness and fall.  History limited due to patient's altered mental status  On initial introduction, patient had face covered with a blanket and stated he is unwilling to remove it.  Jon Smith states he is unsure why he is here and that he feels fine.  He denies any shortness of breath, chest pain, abdominal pain, nausea, vomiting.  He does not recall any recent falls.  Per chart review, patient presented from [place] at [facility] for generalized weakness for 2 days.  Per staff, patient is wheelchair-bound at baseline but is usually able to bear weight and pivot on his own but has been unable to do so without assistance over the last few days.  They were concerned he may have had a fall the patient denied a fall.  ED course: On arrival to the ED, patient was hypertensive at 155/62 with heart rate of 73.  He was saturating at 100% on room air.  He was afebrile at 98.2.  Initial workup notable for WBC of 10.0, hemoglobin 9.7, sodium 133, potassium 5.8 (with hemolysis), bicarb 17, BUN 36, creatinine 1.84 with GFR of 34.  Troponin negative at 12.  Urinalysis with small leukocytes, many bacteria and over 50 WBC/hpf. CT of the head and C-spine was obtained with no evidence of intracranial or cervical spine injury.  Chest x-ray was obtained that demonstrated findings similar to exam in 2022 including right lung base opacity with elevation of the right hemidiaphragm.   CT chest was subsequently obtained that demonstrated dependent areas of groundglass opacities in the upper lobes bilaterally right greater than left likely representing atelectasis in addition to focal sclerosis thickening of the cortex in the proximal right sixth rib.  Patient started on Rocephin for UTI.  TRH contacted for admission.  Review of Systems: As mentioned in the history of present illness. All other systems reviewed and are negative.  Past Medical History:  Diagnosis Date   Back pain    Elevated LFTs    Hepatitis    HLD (hyperlipidemia)    Hypertension    Incomplete bladder emptying    Lower extremity ulceration    Nocturia    Peripheral arterial disease    Prostate cancer remote   Cryotherapy by Dr. Orson Slick.  PSA 5.54 in September at [facility]   Renal stones    Past Surgical History:  Procedure Laterality Date   AORTOGRAM     PERITONEAL CATHETER INSERTION     PROSTATE SURGERY     TRANSLUMINAL ANGIOPLASTY     Social History:  reports that he has quit smoking. His smoking use included cigarettes. He has never used smokeless tobacco. He reports that he does not drink alcohol and does not use drugs.  Allergies  Allergen Reactions   Aspirin Nausea And Vomiting   Sulfamethoxazole-Trimethoprim     Other reaction(s): Blood Disorder Coagulopathy    Family History  Problem Relation Age of Onset   Heart disease Brother    Kidney Stones Sister    Heart attack  Sister    Heart attack Brother    Kidney disease Neg Hx    Prostate cancer Neg Hx     Prior to Admission medications   Medication Sig Start Date End Date Taking? Authorizing Provider  ASPIRIN LOW DOSE 81 MG EC tablet Take 81 mg by mouth daily. 03/26/21   [provider]  atorvastatin (LIPITOR) 40 MG tablet Take 40 mg by mouth daily. 10/12/20   [provider]  clopidogrel (PLAVIX) 75 MG tablet Take by mouth. 08/02/20   [provider]  ferrous sulfate 325 (65 FE) MG EC tablet Take 1 tablet  by mouth daily. 02/26/21   [provider]  finasteride (PROSCAR) 5 MG tablet Take 1 tablet (5 mg total) by mouth daily. 12/18/15   Fernanda Drum, FNP  latanoprost (XALATAN) 0.005 % ophthalmic solution  09/23/15   [provider]  lisinopril (ZESTRIL) 40 MG tablet Take 40 mg by mouth daily. 04/01/22   [provider]  metoprolol tartrate (LOPRESSOR) 25 MG tablet Take 25 mg by mouth 2 (two) times daily. 10/12/20   [provider]  predniSONE (DELTASONE) 50 MG tablet Take 1 tablet (50 mg total) by mouth daily with breakfast. 06/16/22   Cuthriell, Delorise Royals, PA-C  tamsulosin (FLOMAX) 0.4 MG CAPS capsule Take 1 capsule (0.4 mg total) by mouth daily. At bedtime 09/27/15   Fernanda Drum, FNP    Physical Exam: Vitals:   04/11/23 1032 04/11/23 1033 04/11/23 1130 04/11/23 1327  BP: (!) 155/62  (!) 144/63 (!) 153/58  Pulse: 79  79 77  Resp: 20  20 (!) 26  Temp: 98.2 F (36.8 C)     TempSrc: Oral     SpO2: 100%  100% 98%  Weight:  68 kg    Height:  5\' 9"  (1.753 m)     Physical Exam Vitals and nursing note reviewed.  Constitutional:      General: He is not in acute distress.    Appearance: He is not ill-appearing.  HENT:     Head: Normocephalic and atraumatic.     Mouth/Throat:     Mouth: Mucous membranes are moist.     Pharynx: Oropharynx is clear.  Cardiovascular:     Rate and Rhythm: Normal rate and regular rhythm.     Heart sounds: No murmur heard.    No gallop.  Pulmonary:     Effort: Pulmonary effort is normal. No respiratory distress.     Breath sounds: Normal breath sounds. No wheezing or rales.  Abdominal:     General: There is no distension.     Palpations: Abdomen is soft.     Tenderness: There is no abdominal tenderness. There is no guarding.  Musculoskeletal:     Right lower leg: No edema.     Left lower leg: No edema.  Skin:    General: Skin is warm and dry.  Neurological:     Mental Status: He is alert.     Comments:   Patient is alert and oriented to person, place but not situation Moving bilateral upper extremities independently and against gravity Patient unwilling to move legs but able to move bilateral feet No tremor No facial asymmetry or dysarthria.   Psychiatric:        Behavior: Behavior is withdrawn.        Cognition and Memory: Memory is impaired.    Data Reviewed: CBC with WBC of 10.0, hemoglobin 9.7, MCV 91, and platelets 349 CMP with sodium of  133, potassium 5.8 with hemolysis, bicarb 17, BUN 36, creatinine 1.84, AST 29, ALT 11 and GFR 34 Troponin negative at 12 Urinalysis with moderate hematuria, small leukocytes, proteinuria, many bacteria, hyaline casts, mucus, WBC clumps, RBC and WBC  EKG personally reviewed.  Sinus rhythm with rate of 76.  Nonspecific T wave abnormalities.  CT Chest Wo Contrast  Result Date: 04/11/2023 CLINICAL DATA:  Dyspnea, chronic question pneumonia. EXAM: CT CHEST WITHOUT CONTRAST TECHNIQUE: Multidetector CT imaging of the chest was performed following the standard protocol without IV contrast. RADIATION DOSE REDUCTION: This exam was performed according to the departmental dose-optimization program which includes automated exposure control, adjustment of the mA and/or kV according to patient size and/or use of iterative reconstruction technique. COMPARISON:  One-view chest x-ray 04/11/2023 FINDINGS: Cardiovascular: Heart size is normal. Coronary calcifications are present. Atherosclerotic calcifications are present at the aortic arch and origins the great vessels. Calcifications are present in the descending thoracic aorta without aneurysm. Mediastinum/Nodes: No enlarged mediastinal or axillary lymph nodes. Thyroid gland, trachea, and esophagus demonstrate no significant findings. Lungs/Pleura: Dependent areas of ground-glass attenuation present the upper lobes bilaterally. Right greater than left lower lobe dependent ground-glass attenuation is present as well. No  significant airspace consolidation is present. The airways are patent. No nodule or mass lesion is present. No significant pleural disease is present. Upper Abdomen: Cystic changes in the liver and spleen are similar the prior exam. Musculoskeletal: Vertebral body heights alignment normal. Focal sclerosis thickening of the cortex present in the proximal right sixth rib. No other focal osseous lesions are present. IMPRESSION: 1. Dependent areas of ground-glass attenuation the upper lobes bilaterally and right greater than left lower lobe. This likely represents atelectasis. Coronary artery disease. 2. Focal sclerosis thickening of the cortex in the proximal right sixth rib. This can be seen on the two-view chest x-ray 11/26/2021 in retrospect. It was not present in 2017. May be secondary to prior trauma or degenerative change. Metastatic disease is not excluded. Consider nonemergent follow-up MRI of the thoracic spine without and with contrast if clinically indicated. 3.  Aortic Atherosclerosis (ICD10-I70.0). Electronically Signed   By: Marin Roberts M.D.   On: 04/11/2023 12:54   CT Head Wo Contrast  Result Date: 04/11/2023 CLINICAL DATA:  Neck trauma.  Generalized weakness. EXAM: CT HEAD WITHOUT CONTRAST CT CERVICAL SPINE WITHOUT CONTRAST TECHNIQUE: Multidetector CT imaging of the head and cervical spine was performed following the standard protocol without intravenous contrast. Multiplanar CT image reconstructions of the cervical spine were also generated. RADIATION DOSE REDUCTION: This exam was performed according to the departmental dose-optimization program which includes automated exposure control, adjustment of the mA and/or kV according to patient size and/or use of iterative reconstruction technique. COMPARISON:  Head CT 08/25/2016 FINDINGS: CT HEAD FINDINGS Brain: No evidence of acute infarction, hemorrhage, hydrocephalus, extra-axial collection or mass lesion/mass effect. Chronic small vessel  ischemia in the cerebral white matter. Generalized cerebral volume loss. Vascular: No hyperdense vessel or unexpected calcification. Skull: Normal. Negative for fracture or focal lesion. Sinuses/Orbits: No acute finding CT CERVICAL SPINE FINDINGS Alignment: Normal. Skull base and vertebrae: No acute fracture. No primary bone lesion or focal pathologic process. Soft tissues and spinal canal: No prevertebral fluid or swelling. No visible canal hematoma. Disc levels:  Ordinary and generalized cervical spine degeneration. Upper chest: No visible injury IMPRESSION: No evidence of intracranial or cervical spine injury. Electronically Signed   By: Tiburcio Pea M.D.   On: 04/11/2023 11:57   CT Cervical Spine Wo  Contrast  Result Date: 04/11/2023 CLINICAL DATA:  Neck trauma.  Generalized weakness. EXAM: CT HEAD WITHOUT CONTRAST CT CERVICAL SPINE WITHOUT CONTRAST TECHNIQUE: Multidetector CT imaging of the head and cervical spine was performed following the standard protocol without intravenous contrast. Multiplanar CT image reconstructions of the cervical spine were also generated. RADIATION DOSE REDUCTION: This exam was performed according to the departmental dose-optimization program which includes automated exposure control, adjustment of the mA and/or kV according to patient size and/or use of iterative reconstruction technique. COMPARISON:  Head CT 08/25/2016 FINDINGS: CT HEAD FINDINGS Brain: No evidence of acute infarction, hemorrhage, hydrocephalus, extra-axial collection or mass lesion/mass effect. Chronic small vessel ischemia in the cerebral white matter. Generalized cerebral volume loss. Vascular: No hyperdense vessel or unexpected calcification. Skull: Normal. Negative for fracture or focal lesion. Sinuses/Orbits: No acute finding CT CERVICAL SPINE FINDINGS Alignment: Normal. Skull base and vertebrae: No acute fracture. No primary bone lesion or focal pathologic process. Soft tissues and spinal canal: No  prevertebral fluid or swelling. No visible canal hematoma. Disc levels:  Ordinary and generalized cervical spine degeneration. Upper chest: No visible injury IMPRESSION: No evidence of intracranial or cervical spine injury. Electronically Signed   By: Tiburcio Pea M.D.   On: 04/11/2023 11:57   DG Chest Portable 1 View  Result Date: 04/11/2023 CLINICAL DATA:  Weakness. EXAM: PORTABLE CHEST 1 VIEW COMPARISON:  11/26/2021. FINDINGS: Cardiac silhouette borderline enlarged. No mediastinal or hilar masses. Elevated right hemidiaphragm. Additional opacity at the right lung base is consistent with atelectasis and/or pneumonia. Mild interstitial prominence in the right mid to lower lung. Right upper lung and left lung are clear. No left pleural effusion.  No evidence of a pneumothorax. Skeletal structures are grossly intact. IMPRESSION: 1. Findings similar to the exam from 11/18/2021. Right lung base opacity associated with elevation of the right hemidiaphragm. Lung base opacity suspected to be atelectasis. However, pneumonia should be considered if there are consistent clinical findings. Electronically Signed   By: Amie Portland M.D.   On: 04/11/2023 11:54    There are no new results to review at this time.  Assessment and Plan:  * UTI (urinary tract infection) Patient is presenting with several day history of increasing confusion and generalized weakness with urinalysis concerning for UTI.  Previously cultures grew E. coli with resistance to ampicillin, Augmentin and ciprofloxacin.  - Urine culture pending - Continue Rocephin 1 g daily  Altered mental status Patient today has been experiencing alternating levels of orientation, and personal examination he is oriented to person, place but not situation.  Unclear if patient has an underlying history of dementia.  It is possible current presentation secondary to delirium in the setting of urinary tract infection.  Limited records available from ALF.  -  Delirium precautions  Generalized muscle weakness Per chart review, patient is wheelchair dependent secondary to generalized weakness.  Acute worsening at this time as patient was previously able to transfer independently.  Given lack of focal deficits, low suspicion for CVA.  CT head and C-spine negative  -Treatment of UTI as noted above - PT/OT  Dysphagia Per billing records from the [facility], patient is being treated for unspecified dysphagia.  CT of the chest obtained demonstrates dependent areas of groundglass attenuation in the upper lobes bilaterally right greater than left.  Likely due to atelectasis, however chronic silent aspiration possible well.  Given patient is asymptomatic with no other symptoms to suggest pneumonia, will hold off on treatment.  - SLP evaluation  CKD (chronic kidney disease) Per chart review, patient has a history of CKD stage IIIb with current GFR within baseline.  Chronic anemia Chronic anemia secondary to chronic kidney disease and iron deficiency.  No evidence of bleeding on examination.  Essential hypertension - Continue home antihypertensives  BPH (benign prostatic hyperplasia) - Continue home tamsulosin and finasteride - Bladder scan  CVA (cerebral vascular accident) Per billing records from [facility], patient has a history of CVA. No records in Epic to confirm. Given no records regarding cardiac history, this is the most likely reason for DAPT therapy. Unable to reach family to clarify.   - Continue DAPT and statin  Advance Care Planning:   Code Status: Full Code patient is experiencing alternating levels of orientation and cognition.  Unable to make medical decisions in current state.  Remains full code as no family member or proxy at bedside  Consults: None  Family Communication: No family at bedside.  Attempted to contact patient's son, Arie Powell, however no reply.  Severity of Illness: The appropriate patient  status for this patient is OBSERVATION. Observation status is judged to be reasonable and necessary in order to provide the required intensity of service to ensure the patient's safety. The patient's presenting symptoms, physical exam findings, and initial radiographic and laboratory data in the context of their medical condition is felt to place them at decreased risk for further clinical deterioration. Furthermore, it is anticipated that the patient will be medically stable for discharge from the hospital within 2 midnights of admission.   Author: Verdene Lennert, MD 04/11/2023 2:32 PM  For on call review [email or web address].

## 2023-04-11 NOTE — Progress Notes (Signed)
PHARMACIST - PHYSICIAN COMMUNICATION  CONCERNING:  Enoxaparin (Lovenox) for DVT Prophylaxis    RECOMMENDATION: Patient was prescribed enoxaprin 40mg  q24 hours for VTE prophylaxis.   Filed Weights   04/11/23 1033  Weight: 68 kg (150 lb)    Body mass index is 22.15 kg/m.  Estimated Creatinine Clearance: 24.1 mL/min (A) (by C-G formula based on SCr of 1.84 mg/dL (H)).  Patient is candidate for enoxaparin 30mg  every 24 hours based on CrCl <58ml/min or Weight <45kg  DESCRIPTION: Pharmacy has adjusted enoxaparin dose per [facility] policy.  Patient is now receiving enoxaparin 30 mg every 24 hours    Gardner Candle, PharmD, BCPS Clinical Pharmacist 04/11/2023 1:54 PM

## 2023-04-11 NOTE — ED Triage Notes (Signed)
Pt BIB [facility] [place] for generalized weakness X 2 days. Per EMS, staff at facility concerned for fall but pt denies fall. Disoriented to year, but otherwise answers questions correctly. EMS reports pt is wheelchair bound at baseline but is usually able to bear weight and pivot to bed/chair, but has been unable to do so without assistance for the past few days.

## 2023-04-11 NOTE — Assessment & Plan Note (Signed)
Patient today has been experiencing alternating levels of orientation, and personal examination he is oriented to person, place but not situation.  Unclear if patient has an underlying history of dementia.  It is possible current presentation secondary to delirium in the setting of urinary tract infection.  Limited records available from ALF.  - Delirium precautions

## 2023-04-11 NOTE — Assessment & Plan Note (Signed)
Per chart review, patient has a history of CKD stage IIIb with current GFR within baseline.

## 2023-04-11 NOTE — ED Provider Notes (Signed)
[facility] Provider Note    Event Date/Time   First MD Initiated Contact with Patient 04/11/23 1101     (approximate)   History   Fall and Weakness  M caveat: Confusion  HPI  Jon Smith is a 89 y.o. male here for evaluation after potential fall at his care facility.  Patient does not recall falling, but he also does not know the name of his care facility.  He tells me he is currently residing in his home and [facility] which is probably 45 minutes away  He is not aware of where he is or why he is here.  History obtained by EMS patient residing at the [place] [facility] [address] has been weak for at least the last couple of days.  He is wheelchair-bound at baseline but normally able to get up and bear weight not today     Physical Exam   Triage Vital Signs: ED Triage Vitals  Enc Vitals Group     BP 04/11/23 1032 (!) 155/62     Pulse Rate 04/11/23 1032 79     Resp 04/11/23 1032 20     Temp 04/11/23 1032 98.2 F (36.8 C)     Temp Source 04/11/23 1032 Oral     SpO2 04/11/23 1030 96 %     Weight 04/11/23 1033 150 lb (68 kg)     Height 04/11/23 1033 5\' 9"  (1.753 m)     Head Circumference --      Peak Flow --      Pain Score 04/11/23 1033 0     Pain Loc --      Pain Edu? --      Excl. in GC? --     Most recent vital signs: Vitals:   04/11/23 1130 04/11/23 1327  BP: (!) 144/63 (!) 153/58  Pulse: 79 77  Resp: 20 (!) 26  Temp:    SpO2: 100% 98%     General: Awake, no distress.  Does not appear in distress, he is calm, but disoriented.  Reports he does not recall any sort of fall but again does not know that he resides in a local nursing facility.  This membranes appear slightly dry CV:  Good peripheral perfusion.  Normal tones and rate Resp:  Normal effort.  Clear bilaterally Abd:  No distention.  Soft nontender nondistended Other:  Moves all extremities well.  Range of motion through all extremities without pain or discomfort no  deformities.  No pain to the hips pelvis and abdomen soft nontender nondistended.  Normocephalic atraumatic no obvious evidence of head injury.  No cervical tenderness   ED Results / Procedures / Treatments   Labs (all labs ordered are listed, but only abnormal results are displayed) Labs Reviewed  COMPREHENSIVE METABOLIC PANEL - Abnormal; Notable for the following components:      Result Value   Sodium 133 (*)    Potassium 5.8 (*)    CO2 17 (*)    BUN 36 (*)    Creatinine, Ser 1.84 (*)    Calcium 8.8 (*)    Total Bilirubin 1.6 (*)    GFR, Estimated 34 (*)    All other components within normal limits  CBC WITH DIFFERENTIAL/PLATELET - Abnormal; Notable for the following components:   RBC 3.48 (*)    Hemoglobin 9.7 (*)    HCT 31.8 (*)    RDW 17.2 (*)    Monocytes Absolute 1.3 (*)    All other  components within normal limits  URINALYSIS, ROUTINE W REFLEX MICROSCOPIC - Abnormal; Notable for the following components:   Color, Urine YELLOW (*)    APPearance CLOUDY (*)    Hgb urine dipstick MODERATE (*)    Protein, ur 30 (*)    Leukocytes,Ua SMALL (*)    Bacteria, UA MANY (*)    All other components within normal limits  URINE CULTURE  CBG MONITORING, ED  TROPONIN I (HIGH SENSITIVITY)     EKG  Interpreted by me at 1033 heart rate 75 QRS 100 QTc 400 Normal sinus rhythm no evidence of acute ischemia.  Mild somewhat nonspecific T wave abnormality noted with slight depression to 3 aVF V5 and V6.  Appears fairly similar morphology to previous   RADIOLOGY  CT Chest Wo Contrast  Result Date: 04/11/2023 CLINICAL DATA:  Dyspnea, chronic question pneumonia. EXAM: CT CHEST WITHOUT CONTRAST TECHNIQUE: Multidetector CT imaging of the chest was performed following the standard protocol without IV contrast. RADIATION DOSE REDUCTION: This exam was performed according to the departmental dose-optimization program which includes automated exposure control, adjustment of the mA and/or kV  according to patient size and/or use of iterative reconstruction technique. COMPARISON:  One-view chest x-ray 04/11/2023 FINDINGS: Cardiovascular: Heart size is normal. Coronary calcifications are present. Atherosclerotic calcifications are present at the aortic arch and origins the great vessels. Calcifications are present in the descending thoracic aorta without aneurysm. Mediastinum/Nodes: No enlarged mediastinal or axillary lymph nodes. Thyroid gland, trachea, and esophagus demonstrate no significant findings. Lungs/Pleura: Dependent areas of ground-glass attenuation present the upper lobes bilaterally. Right greater than left lower lobe dependent ground-glass attenuation is present as well. No significant airspace consolidation is present. The airways are patent. No nodule or mass lesion is present. No significant pleural disease is present. Upper Abdomen: Cystic changes in the liver and spleen are similar the prior exam. Musculoskeletal: Vertebral body heights alignment normal. Focal sclerosis thickening of the cortex present in the proximal right sixth rib. No other focal osseous lesions are present. IMPRESSION: 1. Dependent areas of ground-glass attenuation the upper lobes bilaterally and right greater than left lower lobe. This likely represents atelectasis. Coronary artery disease. 2. Focal sclerosis thickening of the cortex in the proximal right sixth rib. This can be seen on the two-view chest x-ray 11/26/2021 in retrospect. It was not present in 2017. May be secondary to prior trauma or degenerative change. Metastatic disease is not excluded. Consider nonemergent follow-up MRI of the thoracic spine without and with contrast if clinically indicated. 3.  Aortic Atherosclerosis (ICD10-I70.0). Electronically Signed   By: Marin Roberts M.D.   On: 04/11/2023 12:54   CT Head Wo Contrast  Result Date: 04/11/2023 CLINICAL DATA:  Neck trauma.  Generalized weakness. EXAM: CT HEAD WITHOUT CONTRAST CT  CERVICAL SPINE WITHOUT CONTRAST TECHNIQUE: Multidetector CT imaging of the head and cervical spine was performed following the standard protocol without intravenous contrast. Multiplanar CT image reconstructions of the cervical spine were also generated. RADIATION DOSE REDUCTION: This exam was performed according to the departmental dose-optimization program which includes automated exposure control, adjustment of the mA and/or kV according to patient size and/or use of iterative reconstruction technique. COMPARISON:  Head CT 08/25/2016 FINDINGS: CT HEAD FINDINGS Brain: No evidence of acute infarction, hemorrhage, hydrocephalus, extra-axial collection or mass lesion/mass effect. Chronic small vessel ischemia in the cerebral white matter. Generalized cerebral volume loss. Vascular: No hyperdense vessel or unexpected calcification. Skull: Normal. Negative for fracture or focal lesion. Sinuses/Orbits: No acute finding CT CERVICAL  SPINE FINDINGS Alignment: Normal. Skull base and vertebrae: No acute fracture. No primary bone lesion or focal pathologic process. Soft tissues and spinal canal: No prevertebral fluid or swelling. No visible canal hematoma. Disc levels:  Ordinary and generalized cervical spine degeneration. Upper chest: No visible injury IMPRESSION: No evidence of intracranial or cervical spine injury. Electronically Signed   By: Tiburcio Pea M.D.   On: 04/11/2023 11:57   CT Cervical Spine Wo Contrast  Result Date: 04/11/2023 CLINICAL DATA:  Neck trauma.  Generalized weakness. EXAM: CT HEAD WITHOUT CONTRAST CT CERVICAL SPINE WITHOUT CONTRAST TECHNIQUE: Multidetector CT imaging of the head and cervical spine was performed following the standard protocol without intravenous contrast. Multiplanar CT image reconstructions of the cervical spine were also generated. RADIATION DOSE REDUCTION: This exam was performed according to the departmental dose-optimization program which includes automated exposure  control, adjustment of the mA and/or kV according to patient size and/or use of iterative reconstruction technique. COMPARISON:  Head CT 08/25/2016 FINDINGS: CT HEAD FINDINGS Brain: No evidence of acute infarction, hemorrhage, hydrocephalus, extra-axial collection or mass lesion/mass effect. Chronic small vessel ischemia in the cerebral white matter. Generalized cerebral volume loss. Vascular: No hyperdense vessel or unexpected calcification. Skull: Normal. Negative for fracture or focal lesion. Sinuses/Orbits: No acute finding CT CERVICAL SPINE FINDINGS Alignment: Normal. Skull base and vertebrae: No acute fracture. No primary bone lesion or focal pathologic process. Soft tissues and spinal canal: No prevertebral fluid or swelling. No visible canal hematoma. Disc levels:  Ordinary and generalized cervical spine degeneration. Upper chest: No visible injury IMPRESSION: No evidence of intracranial or cervical spine injury. Electronically Signed   By: Tiburcio Pea M.D.   On: 04/11/2023 11:57   DG Chest Portable 1 View  Result Date: 04/11/2023 CLINICAL DATA:  Weakness. EXAM: PORTABLE CHEST 1 VIEW COMPARISON:  11/26/2021. FINDINGS: Cardiac silhouette borderline enlarged. No mediastinal or hilar masses. Elevated right hemidiaphragm. Additional opacity at the right lung base is consistent with atelectasis and/or pneumonia. Mild interstitial prominence in the right mid to lower lung. Right upper lung and left lung are clear. No left pleural effusion.  No evidence of a pneumothorax. Skeletal structures are grossly intact. IMPRESSION: 1. Findings similar to the exam from 11/18/2021. Right lung base opacity associated with elevation of the right hemidiaphragm. Lung base opacity suspected to be atelectasis. However, pneumonia should be considered if there are consistent clinical findings. Electronically Signed   By: Amie Portland M.D.   On: 04/11/2023 11:54       PROCEDURES:  Critical Care performed:  No  Procedures   MEDICATIONS ORDERED IN ED: Medications  enoxaparin (LOVENOX) injection 30 mg (has no administration in time range)  sodium chloride flush (NS) 0.9 % injection 3 mL (3 mLs Intravenous Not Given 04/11/23 1412)  acetaminophen (TYLENOL) tablet 650 mg (has no administration in time range)    Or  acetaminophen (TYLENOL) suppository 650 mg (has no administration in time range)  ondansetron (ZOFRAN) tablet 4 mg (has no administration in time range)    Or  ondansetron (ZOFRAN) injection 4 mg (has no administration in time range)  polyethylene glycol (MIRALAX / GLYCOLAX) packet 17 g (has no administration in time range)  cefTRIAXone (ROCEPHIN) 1 g in sodium chloride 0.9 % 100 mL IVPB (has no administration in time range)  metoprolol tartrate (LOPRESSOR) tablet 25 mg (has no administration in time range)  tamsulosin (FLOMAX) capsule 0.4 mg (has no administration in time range)  lisinopril (ZESTRIL) tablet 40 mg (has no administration  in time range)  latanoprost (XALATAN) 0.005 % ophthalmic solution 1 drop (has no administration in time range)  finasteride (PROSCAR) tablet 5 mg (has no administration in time range)  ferrous sulfate tablet 325 mg (has no administration in time range)  clopidogrel (PLAVIX) tablet 75 mg (has no administration in time range)  atorvastatin (LIPITOR) tablet 40 mg (has no administration in time range)  aspirin EC tablet 81 mg (has no administration in time range)  sodium chloride 0.9 % bolus 500 mL (0 mLs Intravenous Stopped 04/11/23 1413)  cefTRIAXone (ROCEPHIN) 1 g in sodium chloride 0.9 % 100 mL IVPB (0 g Intravenous Stopped 04/11/23 1412)     IMPRESSION / MDM / ASSESSMENT AND PLAN / ED COURSE  I reviewed the triage vital signs and the nursing notes.                              Differential diagnosis includes, but is not limited to, broad range of potential causes, does have apparently history of urinary tract infection in the past with altered  mentation, but today he has very nonfocal exam without central neurologic factors.  He has generalized weakness confusion which is apparently beyond his baseline and increased weakness for at least the last couple days.  Broad workup anticipated at this time.    Patient's presentation is most consistent with acute complicated illness / injury requiring diagnostic workup.  The patient is on the cardiac monitor to evaluate for evidence of arrhythmia and/or significant heart rate changes.  Labs notable for anemia, chronic renal disease.  Moderately reduced CO2 slightly increased potassium but on hemolyzed sample.  Urinalysis concerning for urinary tract infection   Given the patient's presentation, confusion and concern for urinary tract infection though he does not meet sepsis criteria at this time I do believe the patient will require inpatient treatment due to his level of weakness, associated confusion further care and workup under the hospitalist.  Consulted with and patient accepted to the hospitalist service by Dr. Huel Cote  FINAL CLINICAL IMPRESSION(S) / ED DIAGNOSES   Final diagnoses:  Lower urinary tract infectious disease  Disorientation     Rx / DC Orders   ED Discharge Orders     None        Note:  This document was prepared using Dragon voice recognition software and may include unintentional dictation errors.   Sharyn Creamer, MD 04/11/23 1430

## 2023-04-12 DIAGNOSIS — R410 Disorientation, unspecified: Secondary | ICD-10-CM | POA: Diagnosis not present

## 2023-04-12 DIAGNOSIS — Z7902 Long term (current) use of antithrombotics/antiplatelets: Secondary | ICD-10-CM | POA: Diagnosis not present

## 2023-04-12 DIAGNOSIS — D631 Anemia in chronic kidney disease: Secondary | ICD-10-CM | POA: Diagnosis not present

## 2023-04-12 DIAGNOSIS — Z8673 Personal history of transient ischemic attack (TIA), and cerebral infarction without residual deficits: Secondary | ICD-10-CM | POA: Diagnosis not present

## 2023-04-12 DIAGNOSIS — R1319 Other dysphagia: Secondary | ICD-10-CM | POA: Diagnosis not present

## 2023-04-12 DIAGNOSIS — N3001 Acute cystitis with hematuria: Secondary | ICD-10-CM | POA: Diagnosis not present

## 2023-04-12 DIAGNOSIS — B9620 Unspecified Escherichia coli [E. coli] as the cause of diseases classified elsewhere: Secondary | ICD-10-CM | POA: Diagnosis not present

## 2023-04-12 DIAGNOSIS — N400 Enlarged prostate without lower urinary tract symptoms: Secondary | ICD-10-CM | POA: Diagnosis not present

## 2023-04-12 DIAGNOSIS — Z79899 Other long term (current) drug therapy: Secondary | ICD-10-CM | POA: Diagnosis not present

## 2023-04-12 DIAGNOSIS — J9811 Atelectasis: Secondary | ICD-10-CM | POA: Diagnosis not present

## 2023-04-12 DIAGNOSIS — I129 Hypertensive chronic kidney disease with stage 1 through stage 4 chronic kidney disease, or unspecified chronic kidney disease: Secondary | ICD-10-CM | POA: Diagnosis not present

## 2023-04-12 DIAGNOSIS — N1832 Chronic kidney disease, stage 3b: Secondary | ICD-10-CM | POA: Diagnosis not present

## 2023-04-12 DIAGNOSIS — I739 Peripheral vascular disease, unspecified: Secondary | ICD-10-CM | POA: Diagnosis not present

## 2023-04-12 DIAGNOSIS — R1310 Dysphagia, unspecified: Secondary | ICD-10-CM | POA: Diagnosis not present

## 2023-04-12 DIAGNOSIS — E785 Hyperlipidemia, unspecified: Secondary | ICD-10-CM | POA: Diagnosis not present

## 2023-04-12 DIAGNOSIS — Z1623 Resistance to quinolones and fluoroquinolones: Secondary | ICD-10-CM | POA: Diagnosis not present

## 2023-04-12 DIAGNOSIS — Z882 Allergy status to sulfonamides status: Secondary | ICD-10-CM | POA: Diagnosis not present

## 2023-04-12 DIAGNOSIS — Z7982 Long term (current) use of aspirin: Secondary | ICD-10-CM | POA: Diagnosis not present

## 2023-04-12 DIAGNOSIS — M6281 Muscle weakness (generalized): Secondary | ICD-10-CM | POA: Diagnosis not present

## 2023-04-12 DIAGNOSIS — Z993 Dependence on wheelchair: Secondary | ICD-10-CM | POA: Diagnosis not present

## 2023-04-12 DIAGNOSIS — Z87442 Personal history of urinary calculi: Secondary | ICD-10-CM | POA: Diagnosis not present

## 2023-04-12 DIAGNOSIS — Z886 Allergy status to analgesic agent status: Secondary | ICD-10-CM | POA: Diagnosis not present

## 2023-04-12 DIAGNOSIS — Z8546 Personal history of malignant neoplasm of prostate: Secondary | ICD-10-CM | POA: Diagnosis not present

## 2023-04-12 DIAGNOSIS — R531 Weakness: Secondary | ICD-10-CM | POA: Diagnosis not present

## 2023-04-12 DIAGNOSIS — Z8249 Family history of ischemic heart disease and other diseases of the circulatory system: Secondary | ICD-10-CM | POA: Diagnosis not present

## 2023-04-12 DIAGNOSIS — Z1611 Resistance to penicillins: Secondary | ICD-10-CM | POA: Diagnosis not present

## 2023-04-12 DIAGNOSIS — Z87891 Personal history of nicotine dependence: Secondary | ICD-10-CM | POA: Diagnosis not present

## 2023-04-12 DIAGNOSIS — I10 Essential (primary) hypertension: Secondary | ICD-10-CM | POA: Diagnosis not present

## 2023-04-12 DIAGNOSIS — N390 Urinary tract infection, site not specified: Secondary | ICD-10-CM | POA: Diagnosis not present

## 2023-04-12 LAB — CBC
HCT: 24.4 % — ABNORMAL LOW (ref 39.0–52.0)
Hemoglobin: 7.9 g/dL — ABNORMAL LOW (ref 13.0–17.0)
MCH: 28.4 pg (ref 26.0–34.0)
MCHC: 32.4 g/dL (ref 30.0–36.0)
MCV: 87.8 fL (ref 80.0–100.0)
Platelets: 296 10*3/uL (ref 150–400)
RBC: 2.78 MIL/uL — ABNORMAL LOW (ref 4.22–5.81)
RDW: 17 % — ABNORMAL HIGH (ref 11.5–15.5)
WBC: 22.6 10*3/uL — ABNORMAL HIGH (ref 4.0–10.5)
nRBC: 0 % (ref 0.0–0.2)

## 2023-04-12 LAB — BASIC METABOLIC PANEL
Anion gap: 9 (ref 5–15)
BUN: 36 mg/dL — ABNORMAL HIGH (ref 8–23)
CO2: 17 mmol/L — ABNORMAL LOW (ref 22–32)
Calcium: 8.5 mg/dL — ABNORMAL LOW (ref 8.9–10.3)
Chloride: 108 mmol/L (ref 98–111)
Creatinine, Ser: 1.95 mg/dL — ABNORMAL HIGH (ref 0.61–1.24)
GFR, Estimated: 31 mL/min — ABNORMAL LOW (ref >60)
Glucose, Bld: 90 mg/dL (ref 70–99)
Potassium: 4.3 mmol/L (ref 3.5–5.1)
Sodium: 134 mmol/L — ABNORMAL LOW (ref 135–145)

## 2023-04-12 NOTE — Plan of Care (Signed)
  Problem: Clinical Measurements: Goal: Diagnostic test results will improve Outcome: Progressing   Problem: Activity: Goal: Risk for activity intolerance will decrease Outcome: Progressing   Problem: Nutrition: Goal: Adequate nutrition will be maintained Outcome: Progressing   Problem: Elimination: Goal: Will not experience complications related to bowel motility Outcome: Progressing Goal: Will not experience complications related to urinary retention Outcome: Progressing   Problem: Pain Managment: Goal: General experience of comfort will improve Outcome: Progressing   

## 2023-04-12 NOTE — Evaluation (Signed)
Clinical/Bedside Swallow Evaluation Patient Details  Name: Jon Smith MRN: [id number] Date of Birth: 1928/10/06  Today's Date: 04/12/2023 Time: SLP Start Time (ACUTE ONLY): 0840 SLP Stop Time (ACUTE ONLY): 0911 SLP Time Calculation (min) (ACUTE ONLY): 31 min  Past Medical History:  Past Medical History:  Diagnosis Date   Back pain    Elevated LFTs    Hepatitis    HLD (hyperlipidemia)    Hypertension    Incomplete bladder emptying    Lower extremity ulceration    Nocturia    Peripheral arterial disease    Prostate cancer remote   Cryotherapy by Dr. Orson Slick.  PSA 5.54 in September at [facility]   Renal stones    Past Surgical History:  Past Surgical History:  Procedure Laterality Date   AORTOGRAM     PERITONEAL CATHETER INSERTION     PROSTATE SURGERY     TRANSLUMINAL ANGIOPLASTY     HPI:  Jon Smith is a 89 y.o. male with medical history significant of CKD, hypertension, hyperlipidemia, prostate cancer s/p cryotherapy, nephrolithiasis, iron deficiency anemia, chronic muscle weakness currently wheelchair-bound who presents to the ED with weakness and fall. Admitted with sepsis 2/2 UTI; SLP consulted due to hx of unspecified dysphagia in medical record from [place] [facility] [address] Also noted hx of CVA documented; no records available in [facility] however CT head in 2022 noted for bilateral basal ganglia chronic infarcts, volume loss. No prior SLP evaluations noted in chart. Chest CT 04/11/23: 1. Dependent areas of ground-glass attenuation the upper lobes  bilaterally and right greater than left lower lobe. This likely  represents atelectasis. Coronary artery disease.  2. Focal sclerosis thickening of the cortex in the proximal right  sixth rib. This can be seen on the two-view chest x-ray 11/26/2021  in retrospect. It was not present in 2017. May be secondary to prior  trauma or degenerative change. Metastatic disease is not excluded.  Consider nonemergent follow-up MRI of the thoracic spine without  and  with contrast if clinically indicated.  3.  Aortic Atherosclerosis (ICD10-I70.0).    Assessment / Plan / Recommendation  Clinical Impression  Patient presents with appearance of grossly functional oropharyngeal swallow and adequate airway protection given advanced age and status of dentition. Patient is alert and able to follow basic commands, though requires repetition at times and demonstration or rephrasing in simpler language. He was able to self-feed thin liquids by cup and straw, puree, and softened solid (cereal in milk) independently, with no overt signs or symptoms of aspiration. Oral stage is characterized by prolonged mastication, and pt tends to take consecutive bites, holding solids in left buccal cavity though ultimately clears this with extended time, liquid wash (no cues necessary). Mild lingual weakness and deviation of tongue to L, indicating possible CN 12 impairment. Swallow initiation appears timely, vocal quality remains clear following presentation of all consistencies. Current diet of dysphagia 3, thin liquids appears appropriate given dentition and prolonged mastication. Recommend pills whole in puree; monitor for pocketing and crush if indicated. Recommend general aspiration precautions: upright 90 degrees for all POs and after meals due to hx GERD, slow rate, small bites and sips. Intermittent supervision to monitor for pocketing. No further ST indicated; SLP to s/o.    SLP Visit Diagnosis: Dysphagia, oral phase (R13.11)    Aspiration Risk  Mild aspiration risk    Diet Recommendation Dysphagia 3 (Mech soft);Thin liquid   Liquid Administration via: Cup Medication Administration: Whole meds with puree (check for pocketing and crush if necessary)  Supervision: Patient able to self feed;Intermittent supervision to cue for compensatory strategies Compensations: Slow rate;Small sips/bites;Minimize environmental distractions (monitor for pocketing) Postural Changes: Seated  upright at 90 degrees;Remain upright for at least 30 minutes after po intake    Other  Recommendations Oral Care Recommendations: Oral care BID    Recommendations for follow up therapy are one component of a multi-disciplinary discharge planning process, led by the attending physician.  Recommendations may be updated based on patient status, additional functional criteria and insurance authorization.  Follow up Recommendations No SLP follow up      Assistance Recommended at Discharge  Intermittent supervision for meals  Functional Status Assessment Patient has not had a recent decline in their functional status (with regard to swallowing function)  Frequency and Duration Other (Comment) (evaluation only)  Other (Comment) (one time visit)       Prognosis Prognosis for improved oropharyngeal function: Fair Barriers to Reach Goals: Other (Comment);Cognitive deficits (dentition)      Swallow Study   General Date of Onset: 04/11/23 HPI: Jon Smith is a 89 y.o. male with medical history significant of CKD, hypertension, hyperlipidemia, prostate cancer s/p cryotherapy, nephrolithiasis, iron deficiency anemia, chronic muscle weakness currently wheelchair-bound who presents to the ED with weakness and fall. Admitted with sepsis 2/2 UTI; SLP consulted due to hx of unspecified dysphagia in medical record from [place] [address] Also noted hx of CVA documented; no records available in [facility]. No prior SLP evaluations noted in chart. Chest CT 04/11/23: 1. Dependent areas of ground-glass attenuation the upper lobes  bilaterally and right greater than left lower lobe. This likely  represents atelectasis. Coronary artery disease.  2. Focal sclerosis thickening of the cortex in the proximal right  sixth rib. This can be seen on the two-view chest x-ray 11/26/2021  in retrospect. It was not present in 2017. May be secondary to prior  trauma or degenerative change. Metastatic disease is not excluded.  Consider  nonemergent follow-up MRI of the thoracic spine without and  with contrast if clinically indicated.  3.  Aortic Atherosclerosis (ICD10-I70.0). Type of Study: Bedside Swallow Evaluation Previous Swallow Assessment: none noted Diet Prior to this Study: Dysphagia 3 (mechanical soft);Thin liquids (Level 0) Temperature Spikes Noted: No Respiratory Status: Room air History of Recent Intubation: No Behavior/Cognition: Alert;Cooperative Oral Cavity Assessment: Dry Oral Cavity - Dentition: Poor condition;Missing dentition Vision: Functional for self-feeding Self-Feeding Abilities: Able to feed self Patient Positioning: Upright in bed Baseline Vocal Quality: Normal Volitional Cough: Cognitively unable to elicit Volitional Swallow: Able to elicit    Oral/Motor/Sensory Function Overall Oral Motor/Sensory Function: Mild impairment Facial ROM: Within Functional Limits Facial Symmetry: Within Functional Limits Facial Strength: Within Functional Limits Lingual ROM: Within Functional Limits Lingual Symmetry: Abnormal symmetry right Lingual Strength: Other (Comment) (mild generalized weakness) Velum: Within Functional Limits Mandible: Within Functional Limits   Ice Chips Ice chips: Within functional limits Presentation: Spoon   Thin Liquid Thin Liquid: Within functional limits Presentation: Cup;Self Fed    Nectar Thick Nectar Thick Liquid: Not tested   Honey Thick Honey Thick Liquid: Not tested   Puree Puree: Within functional limits Presentation: Self Fed;Spoon   Solid     Solid: Within functional limits (mech soft: softened cereal in mild) Presentation: Self Fed;Spoon     Rondel Baton, MS, CCC-SLP Speech-Language Pathologist Office: [phone] ASCOM: [phone]   Arlana Lindau 04/12/2023,9:19 AM

## 2023-04-12 NOTE — Progress Notes (Signed)
Progress Note   Patient: Jon Smith WUJ:[id number] DOB: [birth year] DOA: 04/11/2023     0 DOS: the patient was seen and examined on 04/12/2023   Brief hospital course:   89 y.o. male with medical history significant of CKD, hypertension, hyperlipidemia, prostate cancer s/p cryotherapy, nephrolithiasis, iron deficiency anemia, chronic muscle weakness currently wheelchair-bound who presents to the ED with weakness and fall.  History limited due to patient's altered mental status   On initial introduction, patient had face covered with a blanket and stated he is unwilling to remove it.  Mr. Romney states he is unsure why he is here and that he feels fine.  He denies any shortness of breath, chest pain, abdominal pain, nausea, vomiting.  He does not recall any recent falls.   Per chart review, patient presented from [place] at [facility] for generalized weakness for 2 days.  Per staff, patient is wheelchair-bound at baseline but is usually able to bear weight and pivot on his own but has been unable to do so without assistance over the last few days.  They were concerned he may have had a fall the patient denied a fall.   ED course: On arrival to the ED, patient was hypertensive at 155/62 with heart rate of 73.  He was saturating at 100% on room air.  He was afebrile at 98.2.  Initial workup notable for WBC of 10.0, hemoglobin 9.7, sodium 133, potassium 5.8 (with hemolysis), bicarb 17, BUN 36, creatinine 1.84 with GFR of 34.  Troponin negative at 12.  Urinalysis with small leukocytes, many bacteria and over 50 WBC/hpf. CT of the head and C-spine was obtained with no evidence of intracranial or cervical spine injury.  Chest x-ray was obtained that demonstrated findings similar to exam in 2022 including right lung base opacity with elevation of the right hemidiaphragm.  CT chest was subsequently obtained that demonstrated dependent areas of groundglass opacities in the upper lobes bilaterally right greater  than left likely representing atelectasis in addition to focal sclerosis thickening of the cortex in the proximal right sixth rib.  Patient started on Rocephin for UTI.  TRH contacted for admission.  4/13 : Patient continues to stay pleasantly confused.  Continue  on the management for UTI.  Assessment and Plan: * UTI (urinary tract infection) Patient is presenting with several day history of increasing confusion and generalized weakness with urinalysis concerning for UTI.  Previously cultures grew E. coli with resistance to ampicillin, Augmentin and ciprofloxacin.  - Urine culture pending - Continue Rocephin 1 g daily  Altered mental status Patient today has been experiencing alternating levels of orientation, and personal examination he is oriented to person, place but not situation.  Unclear if patient has an underlying history of dementia.  It is possible current presentation secondary to delirium in the setting of urinary tract infection.  Limited records available from ALF.  - Delirium precautions  Generalized muscle weakness Per chart review, patient is wheelchair dependent secondary to generalized weakness.  Acute worsening at this time as patient was previously able to transfer independently.  Given lack of focal deficits, low suspicion for CVA.  CT head and C-spine negative  -Treatment of UTI as noted above - PT/OT  Dysphagia Per billing records from the [facility], patient is being treated for unspecified dysphagia.  CT of the chest obtained demonstrates dependent areas of groundglass attenuation in the upper lobes bilaterally right greater than left.  Likely due to atelectasis, however chronic silent aspiration possible well.  Given patient is asymptomatic with no other symptoms to suggest pneumonia, will hold off on treatment.  - SLP evaluation  CKD (chronic kidney disease) Per chart review, patient has a history of CKD stage IIIb with current GFR within  baseline.  Chronic anemia Chronic anemia secondary to chronic kidney disease and iron deficiency.  No evidence of bleeding on examination.  Essential hypertension - Continue home antihypertensives  BPH (benign prostatic hyperplasia) - Continue home tamsulosin and finasteride - Bladder scan  CVA (cerebral vascular accident) Per billing records from [facility], patient has a history of CVA. No records in Epic to confirm. Given no records regarding cardiac history, this is the most likely reason for DAPT therapy. Unable to reach family to clarify.   - Continue DAPT and statin        Subjective: Patient sitting at the time my evaluation.  No overnight events.  Pleasantly confused  Physical Exam: Vitals:   04/11/23 2053 04/12/23 0039 04/12/23 0530 04/12/23 0910  BP: (!) 129/57 (!) 120/54 125/63 (!) 150/55  Pulse: (!) 105 87 84 74  Resp: 18 18 18 19   Temp: 98.5 F (36.9 C) 98.2 F (36.8 C) 98.6 F (37 C) 97.7 F (36.5 C)  TempSrc:      SpO2: 96% 100% 98% 100%  Weight:      Height:       Physical Exam Constitutional:      General: He is not in acute distress. HENT:     Mouth/Throat:     Mouth: Mucous membranes are dry.  Eyes:     Extraocular Movements: Extraocular movements intact.     Pupils: Pupils are equal, round, and reactive to light.  Cardiovascular:     Rate and Rhythm: Normal rate.     Heart sounds: No murmur heard. Pulmonary:     Effort: Pulmonary effort is normal. No respiratory distress.  Abdominal:     General: Abdomen is flat.     Palpations: Abdomen is soft.  Musculoskeletal:        General: Normal range of motion.     Cervical back: Normal range of motion.  Neurological:     General: No focal deficit present.     Mental Status: He is alert.  Psychiatric:     Comments: Mute minimally interactive     Data Reviewed:  There are no new results to review at this time. Urine culture and sensitivity pending  Family Communication: No  family at bedside  Disposition: Status is: Observation The patient remains OBS appropriate and will d/c before 2 midnights.  Planned Discharge Destination: Home    Time spent:  Author: Kirstie Peri, MD 04/12/2023 1:02 PM  For on call review [email or web address].

## 2023-04-13 DIAGNOSIS — I10 Essential (primary) hypertension: Secondary | ICD-10-CM | POA: Diagnosis not present

## 2023-04-13 DIAGNOSIS — R410 Disorientation, unspecified: Secondary | ICD-10-CM | POA: Diagnosis not present

## 2023-04-13 DIAGNOSIS — N3001 Acute cystitis with hematuria: Secondary | ICD-10-CM | POA: Diagnosis not present

## 2023-04-13 LAB — URINE CULTURE: Culture: >=100000 — AB

## 2023-04-13 NOTE — Progress Notes (Signed)
Patient admitted from [place] at [place] for ALF.  Attempted to reach out to son to start discharge planning, no answer, voice message left.  Kemper Durie, RN, MSN, CCM Transitions of Care

## 2023-04-13 NOTE — TOC Initial Note (Signed)
Transition of Care [facility]) - Initial/Assessment Note    Patient Details  Name: Jon Smith MRN: [id number] Date of Birth: [birth year]  Transition of Care [facility]) CM/SW Contact:    Kemper Durie, RN Phone Number: 04/13/2023, 2:39 PM  Clinical Narrative:                  Spoke with son Jon Smith, confirms that patient is from [place] at [facility] for ALF.  Plan is to discharge back to facility once patient is medically stable.  He state that the [facility] will be able to provide transportation once patient is ready.  Expected Discharge Plan: Assisted Living Barriers to Discharge: Continued Medical Work up   Patient Goals and CMS Choice Patient states their goals for this hospitalization and ongoing recovery are:: Per son, back to ALF          Expected Discharge Plan and Services       Living arrangements for the past 2 months: Assisted Living Facility                                      Prior Living Arrangements/Services Living arrangements for the past 2 months: Assisted Living Facility Lives with:: Facility Resident              Current home services: DME    Activities of Daily Living Home Assistive Devices/Equipment: Wheelchair ADL Screening (condition at time of admission) Patient's cognitive ability adequate to safely complete daily activities?: No Is the patient deaf or have difficulty hearing?: Yes Does the patient have difficulty seeing, even when wearing glasses/contacts?: No Does the patient have difficulty concentrating, remembering, or making decisions?: Yes Patient able to express need for assistance with ADLs?: Yes Does the patient have difficulty dressing or bathing?: Yes Independently performs ADLs?: No Does the patient have difficulty walking or climbing stairs?: Yes Weakness of Legs: Both Weakness of Arms/Hands: None  Permission Sought/Granted                  Emotional Assessment              Admission diagnosis:  Lower urinary  tract infectious disease [N39.0] UTI (urinary tract infection) [N39.0] Disorientation [R41.0] Patient Active Problem List   Diagnosis Date Noted   UTI (urinary tract infection) 04/11/2023   Altered mental status 04/11/2023   Generalized weakness 04/11/2023   BPH (benign prostatic hyperplasia) 04/11/2023   CVA (cerebral vascular accident) 04/11/2023   Dysphagia 04/11/2023   Renovascular hypertension 10/13/2020   CKD (chronic kidney disease) 07/25/2020   Disease characterized by destruction of skeletal muscle 07/25/2020   Elevated AST (SGOT) 07/25/2020   Fall 07/25/2020   Hyperkalemia 07/25/2020   Urinary retention 07/25/2020   Vitamin B12 deficiency 08/20/2019   Generalized muscle weakness 08/18/2019   Asymptomatic proteinuria 02/22/2019   Vitamin D deficiency 02/22/2019   Chronic anemia 02/21/2019   High risk medication use 02/21/2019   Scalp hematoma 04/28/2018   Urine test positive for microalbuminuria 08/27/2017   Neck pain 07/01/2017   DDD (degenerative disc disease), cervical 07/01/2017   Prostate cancer 01/16/2016   Elevated PSA 01/16/2016   Protein-calorie malnutrition, severe 12/12/2015   Gross hematuria    Acute hepatitis 12/08/2015   Elevated LFTs    Abnormal liver function tests 12/06/2015   H/O malignant neoplasm of prostate 09/18/2015   Hypercholesteremia 05/17/2014   Essential hypertension 05/17/2014   Photodermatitis due to sun  05/17/2014   PCP:  [facility]:   [facility] [place] [place], [place] - [address] [place] [address] Phone: [phone] Fax: [phone]     Social Determinants of Health (SDOH) Social History: SDOH Screenings   Food Insecurity: No Food Insecurity (04/11/2023)  Housing: Low Risk  (04/11/2023)  Transportation Needs: No Transportation Needs (04/11/2023)  Utilities: Not At Risk (04/11/2023)  Tobacco Use: Medium Risk (04/11/2023)   SDOH Interventions:     Readmission  Risk Interventions     No data to display

## 2023-04-13 NOTE — Plan of Care (Signed)

## 2023-04-13 NOTE — Progress Notes (Addendum)
. Progress Note   Patient: Jon Smith ZOX:[id number] DOB: 1928-01-19 DOA: 04/11/2023     1 DOS: the patient was seen and examined on 04/13/2023   Brief hospital course:   89 y.o. male with medical history significant of CKD, hypertension, hyperlipidemia, prostate cancer s/p cryotherapy, nephrolithiasis, iron deficiency anemia, chronic muscle weakness currently wheelchair-bound who presents to the ED with weakness and fall.  History limited due to patient's altered mental status   On initial introduction, patient had face covered with a blanket and stated he is unwilling to remove it.  Mr. Seitz states he is unsure why he is here and that he feels fine.  He denies any shortness of breath, chest pain, abdominal pain, nausea, vomiting.  He does not recall any recent falls.   Per chart review, patient presented from [place] at [facility] for generalized weakness for 2 days.  Per staff, patient is wheelchair-bound at baseline but is usually able to bear weight and pivot on his own but has been unable to do so without assistance over the last few days.  They were concerned he may have had a fall the patient denied a fall.   ED course: On arrival to the ED, patient was hypertensive at 155/62 with heart rate of 73.  He was saturating at 100% on room air.  He was afebrile at 98.2.  Initial workup notable for WBC of 10.0, hemoglobin 9.7, sodium 133, potassium 5.8 (with hemolysis), bicarb 17, BUN 36, creatinine 1.84 with GFR of 34.  Troponin negative at 12.  Urinalysis with small leukocytes, many bacteria and over 50 WBC/hpf. CT of the head and C-spine was obtained with no evidence of intracranial or cervical spine injury.  Chest x-ray was obtained that demonstrated findings similar to exam in 2022 including right lung base opacity with elevation of the right hemidiaphragm.  CT chest was subsequently obtained that demonstrated dependent areas of groundglass opacities in the upper lobes bilaterally right greater  than left likely representing atelectasis in addition to focal sclerosis thickening of the cortex in the proximal right sixth rib.  Patient started on Rocephin for UTI.  TRH contacted for admission.  4/13 : Patient continues to stay pleasantly confused.  Continue on the management for UTI.  4/14 : Patient remained afebrile, urine culture growing E. coli sensitive to ceftriaxone resistant to Cipro.  Had elevation white count yesterday. Continue to trend. Patient otherwise symptomatically looking and feeling better. Son updated about patient status.  Assessment and Plan: * UTI (urinary tract infection)  Patient is presenting with several day history of increasing confusion and generalized weakness with urinalysis concerning for UTI.  Previously cultures grew E. coli with resistance to ampicillin, Augmentin and ciprofloxacin.  - Urine culture growing E. coli which is resistant to Cipro however sensitive to ceftriaxone. - Continue Rocephin 1 g daily  Altered mental status  Patient today has been experiencing alternating levels of orientation, and personal examination he is oriented to person, place but not situation.  Unclear if patient has an underlying history of dementia.  It is possible current presentation secondary to delirium in the setting of urinary tract infection.  Limited records available from ALF.  - Delirium precautions  Generalized muscle weakness Per chart review, patient is wheelchair dependent secondary to generalized weakness.  Acute worsening at this time as patient was previously able to transfer independently.  Given lack of focal deficits, low suspicion for CVA.  CT head and C-spine negative  -Treatment of UTI as noted above -  PT/OT  Dysphagia Per billing records from the [facility], patient is being treated for unspecified dysphagia.  CT of the chest obtained demonstrates dependent areas of groundglass attenuation in the upper lobes bilaterally right greater than  left.  Likely due to atelectasis, however chronic silent aspiration possible well.  Given patient is asymptomatic with no other symptoms to suggest pneumonia, will hold off on treatment.  -SLP evaluation  CKD (chronic kidney disease)  Per chart review, patient has a history of CKD stage IIIb with current GFR within baseline.  Chronic anemia  -Chronic anemia secondary to chronic kidney disease and iron deficiency.  No evidence of bleeding on examination.  Essential hypertension - Continue home antihypertensives  BPH (benign prostatic hyperplasia) - Continue home tamsulosin and finasteride - Bladder scan  CVA (cerebral vascular accident) Per billing records from [facility], patient has a history of CVA. No records in Epic to confirm. Given no records regarding cardiac history, this is the most likely reason for DAPT therapy. Unable to reach family to clarify.   - Continue DAPT and statin  Subjective: Patient sitting at the time my evaluation. No overnight events. Pleasantly confused  Physical Exam: Vitals:   04/12/23 0530 04/12/23 0910 04/12/23 2204 04/13/23 0915  BP: 125/63 (!) 150/55 139/62 135/70  Pulse: 84 74 76 82  Resp: 18 19 17    Temp: 98.6 F (37 C) 97.7 F (36.5 C) 98.1 F (36.7 C)   TempSrc:      SpO2: 98% 100% 100%   Weight:      Height:       Physical Exam Constitutional:      General: He is not in acute distress. HENT:     Mouth/Throat:     Mouth: Mucous membranes are dry.  Eyes:     Extraocular Movements: Extraocular movements intact.     Pupils: Pupils are equal, round, and reactive to light.  Cardiovascular:     Rate and Rhythm: Normal rate.     Heart sounds: No murmur heard. Pulmonary:     Effort: Pulmonary effort is normal. No respiratory distress.  Abdominal:     General: Abdomen is flat.     Palpations: Abdomen is soft.  Musculoskeletal:        General: Normal range of motion.     Cervical back: Normal range of motion.   Neurological:     General: No focal deficit present.     Mental Status: He is alert.  Psychiatric:     Comments: Mute minimally interactive     Data Reviewed:  There are no new results to review at this time. Urine culture and sensitivity pending  Family Communication: No family at bedside  Disposition: Status is: Observation The patient remains OBS appropriate and will d/c before 2 midnights.  Planned Discharge Destination: Home  Time spent: 14 minute's   Author: Kirstie Peri, MD 04/13/2023 11:50 AM  For on call review [email or web address].

## 2023-04-14 DIAGNOSIS — N3001 Acute cystitis with hematuria: Secondary | ICD-10-CM | POA: Diagnosis not present

## 2023-04-14 LAB — CBC
HCT: 25.9 % — ABNORMAL LOW (ref 39.0–52.0)
Hemoglobin: 8.3 g/dL — ABNORMAL LOW (ref 13.0–17.0)
MCH: 28.2 pg (ref 26.0–34.0)
MCHC: 32 g/dL (ref 30.0–36.0)
MCV: 88.1 fL (ref 80.0–100.0)
Platelets: 319 10*3/uL (ref 150–400)
RBC: 2.94 MIL/uL — ABNORMAL LOW (ref 4.22–5.81)
RDW: 16.8 % — ABNORMAL HIGH (ref 11.5–15.5)
WBC: 7.6 10*3/uL (ref 4.0–10.5)
nRBC: 0 % (ref 0.0–0.2)

## 2023-04-14 LAB — BASIC METABOLIC PANEL
Anion gap: 5 (ref 5–15)
BUN: 35 mg/dL — ABNORMAL HIGH (ref 8–23)
CO2: 20 mmol/L — ABNORMAL LOW (ref 22–32)
Calcium: 8.4 mg/dL — ABNORMAL LOW (ref 8.9–10.3)
Chloride: 107 mmol/L (ref 98–111)
Creatinine, Ser: 1.68 mg/dL — ABNORMAL HIGH (ref 0.61–1.24)
GFR, Estimated: 38 mL/min — ABNORMAL LOW (ref >60)
Glucose, Bld: 108 mg/dL — ABNORMAL HIGH (ref 70–99)
Potassium: 4.4 mmol/L (ref 3.5–5.1)
Sodium: 132 mmol/L — ABNORMAL LOW (ref 135–145)

## 2023-04-14 MED ORDER — ENSURE ENLIVE PO LIQD
237.0000 mL | Freq: Two times a day (BID) | ORAL | Status: DC
  Administered 2023-04-15 – 2023-04-16 (×3): 237 mL via ORAL

## 2023-04-14 MED ORDER — ADULT MULTIVITAMIN W/MINERALS CH
1.0000 | ORAL_TABLET | Freq: Every day | ORAL | Status: DC
  Administered 2023-04-14 – 2023-04-16 (×3): 1 via ORAL
  Filled 2023-04-14 (×3): qty 1

## 2023-04-14 NOTE — Evaluation (Signed)
Physical Therapy Evaluation Patient Details Name: Jon Smith MRN: [id number] DOB: 04/09/1928 Today's Date: 04/14/2023  History of Present Illness  Dailin Regimbal is a 89yoM who comes to [facility] on 4/12 from [place] after 2 days of generalized weakness. Per reports pt is WC bound at baseline and performs pivot transfers typically without assistance but unable recently. PMH: CKD, HTN, HLD, PrCA, anemia. Pt admitted with UTI now on ABX.  Clinical Impression  Pt asleep on entry, awakens to voice. Author introduces self, collects info from pt which is limited by [facility] and I suspect some minimal cognitive deficits. Pt comes to EOB with heavy effort, but no physical assist, then requires a calculated setup for successful squat pivot transfer to recliner. First attempt unsuccessful and nearly results in fall- pt should have been able to better problem solve the barrier of the recliner arm rest but was unable to. Pt set up in chair for breakfast, feeds self once meal is set up- he answers questions about food preferences. NA in room for linen change. RN updated, chair alarm in place. Pt is not confident but does suspect he is weaker than his most recent baseline- he appears weaker than EMR would indicated. Will continue to follow.      Recommendations for follow up therapy are one component of a multi-disciplinary discharge planning process, led by the attending physician.  Recommendations may be updated based on patient status, additional functional criteria and insurance authorization.  Follow Up Recommendations       Assistance Recommended at Discharge Set up Supervision/Assistance  Patient can return home with the following  Direct supervision/assist for medications management;Direct supervision/assist for financial management;Assistance with cooking/housework;A little help with bathing/dressing/bathroom    Equipment Recommendations None recommended by PT  Recommendations for Other  Services       Functional Status Assessment Patient has had a recent decline in their functional status and demonstrates the ability to make significant improvements in function in a reasonable and predictable amount of time.     Precautions / Restrictions Restrictions Weight Bearing Restrictions: No      Mobility  Bed Mobility Overal bed mobility: Modified Independent             General bed mobility comments: heavy effort, reports weaker than baseline.    Transfers Overall transfer level: Needs assistance Equipment used: None Transfers: Bed to chair/wheelchair/BSC       Squat pivot transfers: Min assist     General transfer comment: first attempt nearly resulted in fall to floor, pt did not rise high in squat which limited ability to improvise when his body came into conflict with arm rest of recliner. MaxA back to EOB safely. (2nd attempt with recliner closer and arm rest out of the way)    Ambulation/Gait Ambulation/Gait assistance:  (per chart pt does not AMB at baseline.)                Stairs            Wheelchair Mobility    Modified Rankin (Stroke Patients Only)       Balance                                             Pertinent Vitals/Pain Pain Assessment Pain Assessment: No/denies pain    Home Living Family/patient expects to be discharged to:: Assisted living [facility]  [place])                   Additional Comments: Performs pivot transfer bed to/from [facility] with supervision at baseline. Propels with arms mostly    Prior Function Prior Level of Function : Needs assist  Cognitive Assist : ADLs (cognitive)   ADLs (Cognitive): Intermittent cues               Hand Dominance   Dominant Hand: Right    Extremity/Trunk Assessment   Upper Extremity Assessment Upper Extremity Assessment: Generalized weakness    Lower Extremity Assessment Lower Extremity Assessment: Generalized weakness        Communication      Cognition Arousal/Alertness: Awake/alert Behavior During Therapy: WFL for tasks assessed/performed Overall Cognitive Status: Difficult to assess                                          General Comments      Exercises     Assessment/Plan    PT Assessment Patient needs continued PT services  PT Problem List Decreased strength;Decreased range of motion;Decreased activity tolerance;Decreased balance;Decreased mobility;Decreased coordination;Decreased cognition       PT Treatment Interventions DME instruction;Functional mobility training;Therapeutic activities;Therapeutic exercise;Cognitive remediation;Patient/family education;Wheelchair mobility training    PT Goals (Current goals can be found in the Care Plan section)  Acute Rehab PT Goals Patient Stated Goal: wants to get stronger and return to home PT Goal Formulation: Patient unable to participate in goal setting Time For Goal Achievement: 04/28/23 Potential to Achieve Goals: Good    Frequency Min 3X/week     Co-evaluation               AM-PAC PT "6 Clicks" Mobility  Outcome Measure Help needed turning from your back to your side while in a flat bed without using bedrails?: A Little Help needed moving from lying on your back to sitting on the side of a flat bed without using bedrails?: A Little Help needed moving to and from a bed to a chair (including a wheelchair)?: A Lot Help needed standing up from a chair using your arms (e.g., wheelchair or bedside chair)?: A Lot Help needed to walk in hospital room?: Total Help needed climbing 3-5 steps with a railing? : Total 6 Click Score: 12    End of Session   Activity Tolerance: Patient tolerated treatment well;No increased pain Patient left: in chair;with call bell/phone within reach;with chair alarm set Nurse Communication: Mobility status PT Visit Diagnosis: Unsteadiness on feet (R26.81);Other abnormalities of gait and  mobility (R26.89);Muscle weakness (generalized) (M62.81)    Time: 1443-1540 PT Time Calculation (min) (ACUTE ONLY): 20 min   Charges:   PT Evaluation $PT Eval Low Complexity: 1 Low         10:23 AM, 04/14/23 Rosamaria Lints, PT, DPT Physical Therapist - [facility]  [phone] (ASCOM)    Anav Lammert C 04/14/2023, 10:19 AM

## 2023-04-14 NOTE — Plan of Care (Signed)

## 2023-04-14 NOTE — Progress Notes (Signed)
Initial Nutrition Assessment  DOCUMENTATION CODES:   Not applicable  INTERVENTION:   -Ensure Enlive po BID, each supplement provides 350 kcal and 20 grams of protein -MVI with minerals daily -Obtain new wt  NUTRITION DIAGNOSIS:   Increased nutrient needs related to acute illness as evidenced by estimated needs.  GOAL:   Patient will meet greater than or equal to 90% of their needs  MONITOR:   PO intake, Supplement acceptance  REASON FOR ASSESSMENT:   Malnutrition Screening Tool    ASSESSMENT:   Pt with medical history significant of CKD, hypertension, hyperlipidemia, prostate cancer s/p cryotherapy, nephrolithiasis, iron deficiency anemia, chronic muscle weakness currently wheelchair-bound who presents to with weakness and fall.  Pt admitted with UTI and AMS.   Reviewed I/O's: -1.6 L x 24 hours and -2.5 L since admission  UOP: 1.7 L x 24 hours  Pt sitting up in bed at time of visit. Pt not very interactive with this RD.  He smiled when RD greeted pt, but did not ask further questions. [place] resides a [place] at [facility].   Observed meal tray- pt consumed 75% of meal (meatloaf, broccoli, and mac and cheese).   Reviewed wt hx; suspect most recent wt may be a stated wt due to similarities in past weights. Will obtain new to better assess wt trends.   Per TOC notes, plan to discharge back to the [place] [facility] once medically stable,   Medications reviewed and include ferrous sulfate.   Labs reviewed: Na: 132.    NUTRITION - FOCUSED PHYSICAL EXAM:  Flowsheet Row Most Recent Value  Orbital Region No depletion  Upper Arm Region No depletion  Thoracic and Lumbar Region No depletion  Buccal Region No depletion  Temple Region No depletion  Clavicle Bone Region Moderate depletion  Clavicle and Acromion Bone Region No depletion  Scapular Bone Region No depletion  Dorsal Hand No depletion  Patellar Region Mild depletion  Anterior Thigh Region Mild depletion   Posterior Calf Region Mild depletion  Edema (RD Assessment) None  Hair Reviewed  Eyes Reviewed  Mouth Reviewed  Skin Reviewed  Nails Reviewed       Diet Order:   Diet Order             DIET DYS 3 Room service appropriate? Yes; Fluid consistency: Thin  Diet effective now                   EDUCATION NEEDS:   No education needs have been identified at this time  Skin:  Skin Assessment: Reviewed RN Assessment  Last BM:  04/13/23 (type 6)  Height:   Ht Readings from Last 1 Encounters:  04/11/23 5\' 9"  (1.753 m)    Weight:   Wt Readings from Last 1 Encounters:  04/11/23 68 kg    Ideal Body Weight:  72.7 kg  BMI:  Body mass index is 22.15 kg/m.  Estimated Nutritional Needs:   Kcal:  1700-1900  Protein:  85-100 grams  Fluid:  > 1.7 L    Levada Schilling, RD, LDN, CDCES Registered Dietitian II Certified Diabetes Care and Education Specialist Please refer to [facility] for RD and/or RD on-call/weekend/after hours pager

## 2023-04-14 NOTE — TOC Progression Note (Signed)
Transition of Care [facility]) - Progression Note    Patient Details  Name: Jon Smith MRN: [id number] Date of Birth: 12/22/1928  Transition of Care [facility]) CM/SW Contact  Marlowe Sax, RN Phone Number: 04/14/2023, 9:18 AM  Clinical Narrative:    Called and left a secure VM for Dustin at [employer].  Awaiting a call back for the patient to return   Expected Discharge Plan: Assisted Living Barriers to Discharge: Continued Medical Work up  Expected Discharge Plan and Services       Living arrangements for the past 2 months: Assisted Living Facility                                       Social Determinants of Health (SDOH) Interventions SDOH Screenings   Food Insecurity: No Food Insecurity (04/11/2023)  Housing: Low Risk  (04/11/2023)  Transportation Needs: No Transportation Needs (04/11/2023)  Utilities: Not At Risk (04/11/2023)  Tobacco Use: Medium Risk (04/11/2023)    Readmission Risk Interventions     No data to display

## 2023-04-14 NOTE — Progress Notes (Signed)
PROGRESS NOTE    Jon Smith  PIR:[id number] DOB: May 14, 1928 DOA: 04/11/2023 PCP: [facility]  [id number]  LOS: 2 days   Brief hospital course:   Assessment & Plan:  89 y.o. male with medical history significant of CKD, hypertension, hyperlipidemia, prostate cancer s/p cryotherapy, nephrolithiasis, iron deficiency anemia, chronic muscle weakness currently wheelchair-bound who presents to the ED with weakness and fall.   Per chart review, patient presented from [place] at [facility] for generalized weakness for 2 days.  Per staff, patient is wheelchair-bound at baseline but is usually able to bear weight and pivot on his own but has been unable to do so without assistance over the last few days.  They were concerned he may have had a fall the patient denied a fall.   * UTI (urinary tract infection) 2/2 E coli Patient is presenting with several day history of increasing confusion and generalized weakness with urinalysis concerning for UTI.   - Urine culture growing E. coli which is resistant to Cipro however sensitive to ceftriaxone. --pt completed 3 days of ceftriaxone   Generalized muscle weakness Per chart review, patient is wheelchair dependent secondary to generalized weakness.  Acute worsening at this time as patient was previously able to transfer independently.  Given lack of focal deficits, low suspicion for CVA.  CT head and C-spine negative -Treatment of UTI as noted above - PT   Dysphagia Per billing records from the [facility], patient is being treated for unspecified dysphagia.  CT of the chest obtained demonstrates dependent areas of groundglass attenuation in the upper lobes bilaterally right greater than left.  Likely due to atelectasis, however chronic silent aspiration possible well.   -SLP evaluation found "grossly functional oropharyngeal swallow and adequate airway protection given advanced age and status of dentition." --dys 3 diet with thin  fluids   CKD3b --Cr at baseline    Chronic anemia -Chronic anemia secondary to chronic kidney disease and iron deficiency.  No evidence of bleeding on examination.   Essential hypertension - cont Lisinopril and Lopressor   BPH (benign prostatic hyperplasia) - Continue home tamsulosin and finasteride   CVA (cerebral vascular accident) Per billing records from [place] of [address], patient has a history of CVA. No records in Epic to confirm. Given no records regarding cardiac history, this is the most likely reason for DAPT therapy. Unable to reach family to clarify.    - Continue DAPT and statin    DVT prophylaxis: Lovenox SQ Code Status: Full code  Family Communication:  Level of care: Med-Surg Dispo:   The patient is from: ALF Anticipated d/c is to: ALF Anticipated d/c date is: tomorrow   Subjective and Interval History:  Denied dysuria.  Pt re-iterated that he slipped but didn't fall.    Objective: Vitals:   04/13/23 1637 04/13/23 2158 04/14/23 0859 04/14/23 1724  BP: 129/68 134/67 (!) 110/53 (!) 122/49  Pulse: 77 71 69 71  Resp: 18 18 18 16   Temp: 98.9 F (37.2 C) 98.1 F (36.7 C) 99.1 F (37.3 C) 98.2 F (36.8 C)  TempSrc: Oral     SpO2: 99% 100% 99% 100%  Weight:      Height:        Intake/Output Summary (Last 24 hours) at 04/14/2023 1832 Last data filed at 04/14/2023 0901 Gross per 24 hour  Intake 120 ml  Output 1300 ml  Net -1180 ml   Filed Weights   04/11/23 1033  Weight: 68 kg    Examination:  Constitutional: NAD, alert, oriented to person and place HEENT: conjunctivae and lids normal, EOMI CV: No cyanosis.   RESP: normal respiratory effort, on RA Extremities: No effusions, edema in BLE SKIN: warm, dry Neuro: II - XII grossly intact.     Data Reviewed: I have personally reviewed labs and imaging studies  Time spent: 35 minutes  Darlin Priestly, MD [facility] If 7PM-7AM, please contact night-coverage 04/14/2023, 6:32 PM

## 2023-04-15 DIAGNOSIS — N3001 Acute cystitis with hematuria: Secondary | ICD-10-CM | POA: Diagnosis not present

## 2023-04-15 NOTE — TOC Progression Note (Addendum)
Transition of Care [facility]) - Progression Note    Patient Details  Name: Jon Smith MRN: [id number] Date of Birth: [birth year]  Transition of Care [facility]) CM/SW Contact  Marlowe Sax, RN Phone Number: 04/15/2023, 9:43 AM  Clinical Narrative:     Sent secure email to [facility] at [facility] asking for a call to arrange the patient going back to [facility] [place], awaiting a response  Called Dustin at [employer] and left a VM for a call back  Spoke with [place] in Admissions at [facility], they are able to take the patient back and will provide transportation Provided the bedside nurse with the number to call for transport [phone] Spoke with his son [name]  in the room as well and he is agreeable for the patient to return to the [place] Sent the [facility] thru the [place] to [facility] [employer] notified me that they use [facility] for [facility] I contacted Amy with [phone]  and they accepted the patient for [facility]  I notified the doctor that [facility] will take him back [place] from [facility] [place] said they stopped transport at 3 but they will call me in the morning to arrange pick up He has a wheelchair there   Expected Discharge Plan: Assisted Living Barriers to Discharge: Continued Medical Work up  Expected Discharge Plan and Services       Living arrangements for the past 2 months: Assisted Living Facility                                       Social Determinants of Health (SDOH) Interventions SDOH Screenings   Food Insecurity: No Food Insecurity (04/11/2023)  Housing: Low Risk  (04/11/2023)  Transportation Needs: No Transportation Needs (04/11/2023)  Utilities: Not At Risk (04/11/2023)  Tobacco Use: Medium Risk (04/11/2023)    Readmission Risk Interventions     No data to display

## 2023-04-15 NOTE — Plan of Care (Signed)

## 2023-04-15 NOTE — Progress Notes (Signed)
PROGRESS NOTE    Jon Smith  ZSW:[id number] DOB: 09/21/1928 DOA: 04/11/2023 PCP: [facility]  159A/[id number]-AA  LOS: 3 days   Brief hospital course:   Assessment & Plan: Jheremy Kludt is a 89 y.o. male with medical history significant of CKD, hypertension, hyperlipidemia, prostate cancer s/p cryotherapy, nephrolithiasis, iron deficiency anemia, chronic muscle weakness currently wheelchair-bound who presented to the ED with weakness and fall.   Per chart review, patient presented from [place] at [facility] for generalized weakness for 2 days.  Per staff, patient is wheelchair-bound at baseline but is usually able to bear weight and pivot on his own but has been unable to do so without assistance over the last few days.  They were concerned he may have had a fall however patient denied a fall.   * UTI (urinary tract infection) 2/2 E coli Patient is presenting with several day history of increasing confusion and generalized weakness with urinalysis concerning for UTI.   - Urine culture growing E. coli which is resistant to Cipro however sensitive to ceftriaxone. --pt completed 3 days of ceftriaxone   Generalized muscle weakness Per chart review, patient is wheelchair dependent secondary to generalized weakness.  Acute worsening at this time as patient was previously able to transfer independently.  Given lack of focal deficits, low suspicion for CVA.  CT head and C-spine negative -Treatment of UTI as noted above - PT eval rec home with [facility]   Dysphagia Per billing records from the [facility], patient is being treated for unspecified dysphagia.  CT of the chest obtained demonstrates dependent areas of groundglass attenuation in the upper lobes bilaterally right greater than left.  Likely due to atelectasis, however chronic silent aspiration possible well.   -SLP evaluation found "grossly functional oropharyngeal swallow and adequate airway protection given advanced age and  status of dentition." --dys 3 diet with thin fluids   CKD3b --Cr at baseline    Chronic anemia -Chronic anemia secondary to chronic kidney disease and iron deficiency.  No evidence of bleeding on examination.   Essential hypertension - cont Lisinopril and Lopressor   BPH (benign prostatic hyperplasia) - Continue home tamsulosin and finasteride   CVA (cerebral vascular accident) Per billing records from [place] [facility] [address], patient has a history of CVA. No records in Epic to confirm. Given no records regarding cardiac history, this is the most likely reason for DAPT therapy. Unable to reach family to clarify.    - Continue DAPT and statin    DVT prophylaxis: Lovenox SQ Code Status: Full code  Family Communication:  Level of care: Med-Surg Dispo:   The patient is from: ALF Anticipated d/c is to: ALF Anticipated d/c date is: tomorrow (ALF couldn't take pt back today)   Subjective and Interval History:  No change or complaint today.   Objective: Vitals:   04/15/23 0035 04/15/23 0739 04/15/23 0900 04/15/23 1504  BP: (!) 172/68 120/64  (!) 136/56  Pulse: 71 80  66  Resp: 18 18  18   Temp: 98.6 F (37 C) 98.4 F (36.9 C)  98.5 F (36.9 C)  TempSrc:  Oral    SpO2: 100% 94%  98%  Weight:   65.6 kg   Height:        Intake/Output Summary (Last 24 hours) at 04/15/2023 1857 Last data filed at 04/15/2023 1442 Gross per 24 hour  Intake 897 ml  Output 1450 ml  Net -553 ml   Filed Weights   04/11/23 1033 04/15/23 0900  Weight: 68 kg 65.6 kg    Examination:   Constitutional: NAD CV: No cyanosis.   RESP: normal respiratory effort, on RA Extremities: No effusions, edema in BLE SKIN: warm, dry   Data Reviewed: I have personally reviewed labs and imaging studies  Time spent: 25 minutes  Darlin Priestly, MD [facility] If 7PM-7AM, please contact night-coverage 04/15/2023, 6:57 PM

## 2023-04-15 NOTE — Progress Notes (Signed)
Physical Therapy Treatment Patient Details Name: Jon Smith MRN: [id number] DOB: [birth year] Today's Date: 04/15/2023   History of Present Illness Jon Smith is a 89yoM who comes to [facility] on 4/12 from [place] [facility] [place] after 2 days of generalized weakness. Per reports pt is WC bound at baseline and performs pivot transfers typically without assistance but unable recently. PMH: CKD, HTN, HLD, PrCA, anemia. Pt admitted with UTI now on ABX.    PT Comments    Patient is HOH, but alert, pleasant. Agreeable to PT session. Patient is mod I for bed mobility. Transfers with min A. Unable to get fully standing, maintaining B Knees flexed in standing. Able to tolerate standing for brief time. He attempted to take a couple of side steps to get higher in bed prior to sitting back down required Mod A. Patient is able to scoot up in supine with rails. He will continue to benefit from skilled PT to improve strength and functional independence.       Recommendations for follow up therapy are one component of a multi-disciplinary discharge planning process, led by the attending physician.  Recommendations may be updated based on patient status, additional functional criteria and insurance authorization.  Follow Up Recommendations  Can patient physically be transported by private vehicle: No    Assistance Recommended at Discharge PRN  Patient can return home with the following A lot of help with walking and/or transfers;A little help with bathing/dressing/bathroom;Assist for transportation;Direct supervision/assist for medications management   Equipment Recommendations  None recommended by PT    Recommendations for Other Services       Precautions / Restrictions Precautions Precautions: Fall Restrictions Weight Bearing Restrictions: No     Mobility  Bed Mobility Overal bed mobility: Modified Independent             General bed mobility comments: no physical assist needed for patient to  get edge of bed    Transfers Overall transfer level: Needs assistance Equipment used: Rolling walker (2 wheels) Transfers: Sit to/from Stand Sit to Stand: Min assist, From elevated surface           General transfer comment: patient able to stand x2 with RW, maintains squatted position in standing. Attempted to take a few side steps and was able to to somewhat 1-2 steps with mod A.    Ambulation/Gait               General Gait Details: non-ambulatory at baseline   Stairs             Wheelchair Mobility    Modified Rankin (Stroke Patients Only)       Balance Overall balance assessment: Needs assistance Sitting-balance support: Feet supported Sitting balance-Leahy Scale: Normal     Standing balance support: Bilateral upper extremity supported, During functional activity, Reliant on assistive device for balance Standing balance-Leahy Scale: Poor Standing balance comment: poor standing balance. Reliant on external assist.                            Cognition Arousal/Alertness: Awake/alert Behavior During Therapy: WFL for tasks assessed/performed Overall Cognitive Status: Difficult to assess                                 General Comments: appropriate throughout session. Follows direction well.        Exercises      General Comments  Pertinent Vitals/Pain Pain Assessment Pain Assessment: No/denies pain Breathing: normal Negative Vocalization: occasional moan/groan, low speech, negative/disapproving quality Facial Expression: smiling or inexpressive Body Language: relaxed Consolability: no need to console PAINAD Score: 1 Pain Descriptors / Indicators: Moaning Pain Intervention(s): Monitored during session, Repositioned    Home Living                          Prior Function            PT Goals (current goals can now be found in the care plan section) Acute Rehab PT Goals Patient Stated Goal:  wants to get stronger and return to home PT Goal Formulation: With patient Time For Goal Achievement: 04/28/23 Potential to Achieve Goals: Fair Progress towards PT goals: Progressing toward goals    Frequency    Min 3X/week      PT Plan Discharge plan needs to be updated    Co-evaluation              AM-PAC PT "6 Clicks" Mobility   Outcome Measure  Help needed turning from your back to your side while in a flat bed without using bedrails?: A Little Help needed moving from lying on your back to sitting on the side of a flat bed without using bedrails?: A Little Help needed moving to and from a bed to a chair (including a wheelchair)?: A Lot Help needed standing up from a chair using your arms (e.g., wheelchair or bedside chair)?: A Little Help needed to walk in hospital room?: Total Help needed climbing 3-5 steps with a railing? : Total 6 Click Score: 13    End of Session   Activity Tolerance: Patient limited by fatigue Patient left: in bed;with call bell/phone within reach;with bed alarm set;with nursing/sitter in room Nurse Communication: Mobility status PT Visit Diagnosis: Unsteadiness on feet (R26.81);Other abnormalities of gait and mobility (R26.89);Muscle weakness (generalized) (M62.81)     Time: 7829-5621 PT Time Calculation (min) (ACUTE ONLY): 10 min  Charges:  $Therapeutic Activity: 8-22 mins                     Ailie Gage, PT, GCS 04/15/23,11:55 AM

## 2023-04-15 NOTE — Plan of Care (Signed)
  Problem: Health Behavior/Discharge Planning: Goal: Ability to manage health-related needs will improve Outcome: Progressing   Problem: Clinical Measurements: Goal: Will remain free from infection Outcome: Progressing   Problem: Clinical Measurements: Goal: Respiratory complications will improve Outcome: Progressing   Problem: Clinical Measurements: Goal: Cardiovascular complication will be avoided Outcome: Progressing   Problem: Nutrition: Goal: Adequate nutrition will be maintained Outcome: Progressing   Problem: Elimination: Goal: Will not experience complications related to bowel motility Outcome: Progressing   Problem: Elimination: Goal: Will not experience complications related to urinary retention Outcome: Progressing   Problem: Pain Managment: Goal: General experience of comfort will improve Outcome: Progressing   Problem: Safety: Goal: Ability to remain free from injury will improve Outcome: Progressing

## 2023-04-15 NOTE — [place] FL2 (Signed)
[place] MEDICAID FL2 LEVEL OF CARE FORM     IDENTIFICATION  Patient Name: Jon Smith Birthdate: 1928/07/27 Sex: male Admission Date (Current Location): 04/11/2023  [facility] and [place] Number:  Chiropodist and Address:  [facility], [address], [place], [place] [address]      Provider Number: [id number]  Attending Physician Name and Address:  Darlin Priestly, MD  Relative Name and Phone Number:  Duanne Guess [phone]    Current Level of Care: Hospital Recommended Level of Care: Assisted Living Facility Prior Approval Number:    Date Approved/Denied:   PASRR Number:    Discharge Plan: Other (Comment) (ALF)    Current Diagnoses: Patient Active Problem List   Diagnosis Date Noted   UTI (urinary tract infection) 04/11/2023   Altered mental status 04/11/2023   Generalized weakness 04/11/2023   BPH (benign prostatic hyperplasia) 04/11/2023   CVA (cerebral vascular accident) 04/11/2023   Dysphagia 04/11/2023   Renovascular hypertension 10/13/2020   CKD (chronic kidney disease) 07/25/2020   Disease characterized by destruction of skeletal muscle 07/25/2020   Elevated AST (SGOT) 07/25/2020   Fall 07/25/2020   Hyperkalemia 07/25/2020   Urinary retention 07/25/2020   Vitamin B12 deficiency 08/20/2019   Generalized muscle weakness 08/18/2019   Asymptomatic proteinuria 02/22/2019   Vitamin D deficiency 02/22/2019   Chronic anemia 02/21/2019   High risk medication use 02/21/2019   Scalp hematoma 04/28/2018   Urine test positive for microalbuminuria 08/27/2017   Neck pain 07/01/2017   DDD (degenerative disc disease), cervical 07/01/2017   Prostate cancer 01/16/2016   Elevated PSA 01/16/2016   Protein-calorie malnutrition, severe 12/12/2015   Gross hematuria    Acute hepatitis 12/08/2015   Elevated LFTs    Abnormal liver function tests 12/06/2015   H/O malignant neoplasm of prostate 09/18/2015   Hypercholesteremia 05/17/2014    Essential hypertension 05/17/2014   Photodermatitis due to sun 05/17/2014    Orientation RESPIRATION BLADDER Height & Weight     Self, Place  Normal Incontinent Weight: 65.6 kg Height:   (175.3 cm)  BEHAVIORAL SYMPTOMS/MOOD NEUROLOGICAL BOWEL NUTRITION STATUS      Incontinent Diet (Dysphagia 3 (Mech soft);Thin liquid)  AMBULATORY STATUS COMMUNICATION OF NEEDS Skin   Extensive Assist (wheelchair bound) Verbally Normal                       Personal Care Assistance Level of Assistance  Bathing, Feeding, Dressing Bathing Assistance: Limited assistance Feeding assistance: Limited assistance Dressing Assistance: Maximum assistance     Functional Limitations Info  Sight, Hearing, Speech Sight Info: Adequate Hearing Info: Impaired Speech Info: Impaired    SPECIAL CARE FACTORS FREQUENCY                       Contractures Contractures Info: Not present    Additional Factors Info  Code Status, Allergies Code Status Info: full code Allergies Info: sulfa ANtibioticsw, Aspirin           Current Medications (04/15/2023):  This is the current hospital active medication list Current Facility-Administered Medications  Medication Dose Route Frequency Provider Last Rate Last Admin   acetaminophen (TYLENOL) tablet 650 mg  650 mg Oral Q6H PRN Verdene Lennert, MD       Or   acetaminophen (TYLENOL) suppository 650 mg  650 mg Rectal Q6H PRN Verdene Lennert, MD       aspirin EC tablet 81 mg  81 mg Oral Daily Verdene Lennert,  MD   81 mg at 04/14/23 1058   atorvastatin (LIPITOR) tablet 40 mg  40 mg Oral Daily Verdene Lennert, MD   40 mg at 04/14/23 1058   clopidogrel (PLAVIX) tablet 75 mg  75 mg Oral Daily Verdene Lennert, MD   75 mg at 04/14/23 1058   enoxaparin (LOVENOX) injection 30 mg  30 mg Subcutaneous Q24H Verdene Lennert, MD   30 mg at 04/14/23 2200   feeding supplement (ENSURE ENLIVE / ENSURE PLUS) liquid 237 mL  237 mL Oral BID BM Darlin Priestly, MD       ferrous  sulfate tablet 325 mg  325 mg Oral Q breakfast Verdene Lennert, MD   325 mg at 04/15/23 0859   finasteride (PROSCAR) tablet 5 mg  5 mg Oral Daily Verdene Lennert, MD   5 mg at 04/14/23 1058   latanoprost (XALATAN) 0.005 % ophthalmic solution 1 drop  1 drop Both Eyes QHS Verdene Lennert, MD   1 drop at 04/14/23 2200   lisinopril (ZESTRIL) tablet 40 mg  40 mg Oral Daily Verdene Lennert, MD   40 mg at 04/14/23 1058   metoprolol tartrate (LOPRESSOR) tablet 25 mg  25 mg Oral BID Verdene Lennert, MD   25 mg at 04/15/23 0000   multivitamin with minerals tablet 1 tablet  1 tablet Oral Daily Darlin Priestly, MD   1 tablet at 04/14/23 1853   ondansetron (ZOFRAN) tablet 4 mg  4 mg Oral Q6H PRN Verdene Lennert, MD       Or   ondansetron (ZOFRAN) injection 4 mg  4 mg Intravenous Q6H PRN Verdene Lennert, MD       polyethylene glycol (MIRALAX / GLYCOLAX) packet 17 g  17 g Oral Daily PRN Verdene Lennert, MD       sodium chloride flush (NS) 0.9 % injection 3 mL  3 mL Intravenous Q12H Verdene Lennert, MD   3 mL at 04/14/23 2200   tamsulosin (FLOMAX) capsule 0.4 mg  0.4 mg Oral Daily Verdene Lennert, MD   0.4 mg at 04/14/23 1058     Discharge Medications: Please see discharge summary for a list of discharge medications.  Relevant Imaging Results:  Relevant Lab Results:   Additional Information    Marlowe Sax, RN

## 2023-04-15 NOTE — Consult Note (Signed)
   [facility] Inpatient Consult   04/15/2023  Quinta Labella Jun 16, 1928 [id number]     Location: [facility] RN Hospital Liaison screened [facility]).   [facility] Customer service manager [facility]) Accountable Care Organization [ACO] Patient: Orthoptist)   Primary Care Provider:  [facility]    Patient screened for readmission hospitalization with noted high risk score for unplanned readmission risk with  1 IP 6 months. [facility]/Population Health RN liaison will assess for potential [facility]) Care Management service needs for post hospital transition for care coordination. Pt [address] residence with [facility] [address] (ALF).   Plan:  [facility]/Population Health RN Liaison will continue to follow ongoing disposition in assessing for post hospital community care coordination/management needs.  Referral request for community care coordination: pending disposition.   [facility] Care Management/Population Health does not replace or interfere with any arrangements made by the Inpatient Transition of Care team.   For questions contact:   Elliot Cousin, RN, BSN [facility] Liaison [place]   [facility]  Population Health Office Hours MTWF 8:00 am to 6 pm off on Thursday [phone] mobile [phone] [Office toll free line][facility] Office Hours are M-F 8:30 - 5 pm 24 hour nurse advise line [phone] Conceirge  [email or web address]

## 2023-04-15 NOTE — Care Management Important Message (Signed)
Important Message  Patient Details  Name: Jon Smith MRN: [id number] Date of Birth: 1928/02/01   Medicare Important Message Given:  N/A - LOS <3 / Initial given by admissions     Olegario Messier A Kingsten Enfield 04/15/2023, 10:16 AM

## 2023-04-16 DIAGNOSIS — N390 Urinary tract infection, site not specified: Principal | ICD-10-CM

## 2023-04-16 DIAGNOSIS — R1319 Other dysphagia: Secondary | ICD-10-CM

## 2023-04-16 DIAGNOSIS — R531 Weakness: Secondary | ICD-10-CM | POA: Diagnosis not present

## 2023-04-16 NOTE — TOC Progression Note (Signed)
Transition of Care [facility]) - Progression Note    Patient Details  Name: Keymarion Mumma MRN: [id number] Date of Birth: 07-Jan-1928  Transition of Care [facility]) CM/SW Contact  Marlowe Sax, RN Phone Number: 04/16/2023, 1:42 PM  Clinical Narrative:     Lianne Cure at [facility] at [phone] Left a VM letting her know that he is ready to transport, I called the main number and they said that Dewayne Hatch is out on appointment and they were not sure if she could pick him up today, I asked for a call top clarify if they were picking him up or not, I could call EMS if needed Thy said that they can call me back  Expected Discharge Plan: Assisted Living Barriers to Discharge: Continued Medical Work up  Expected Discharge Plan and Services       Living arrangements for the past 2 months: Assisted Living Facility Expected Discharge Date: 04/16/23                                     Social Determinants of Health (SDOH) Interventions SDOH Screenings   Food Insecurity: No Food Insecurity (04/11/2023)  Housing: Low Risk  (04/11/2023)  Transportation Needs: No Transportation Needs (04/11/2023)  Utilities: Not At Risk (04/11/2023)  Tobacco Use: Medium Risk (04/11/2023)    Readmission Risk Interventions     No data to display

## 2023-04-16 NOTE — Discharge Summary (Signed)
Physician Discharge Summary  Jon Smith NIO:[id number] DOB: January 15, 1928 DOA: 04/11/2023  PCP: [facility]  Admit date: 04/11/2023 Discharge date: 04/16/2023  Admitted From: ALF Disposition:  ALF  Recommendations for Outpatient Follow-up:  Follow up with PCP in 1-2 weeks  Home Health: yes Equipment/Devices:  Discharge Condition: stable  CODE STATUS: Full  Diet recommendation: Dysphagia III   Brief/Interim Summary: HPI was taken from Dr. Huel Smith: Jon Smith is a 89 y.o. male with medical history significant of CKD, hypertension, hyperlipidemia, prostate cancer s/p cryotherapy, nephrolithiasis, iron deficiency anemia, chronic muscle weakness currently wheelchair-bound who presents to the ED with weakness and fall.  History limited due to patient's altered mental status   On initial introduction, patient had face covered with a blanket and stated he is unwilling to remove it.  Mr. Jon Smith states he is unsure why he is here and that he feels fine.  He denies any shortness of breath, chest pain, abdominal pain, nausea, vomiting.  He does not recall any recent falls.   Per chart review, patient presented from [place] at [facility] for generalized weakness for 2 days.  Per staff, patient is wheelchair-bound at baseline but is usually able to bear weight and pivot on his own but has been unable to do so without assistance over the last few days.  They were concerned he may have had a fall the patient denied a fall.   ED course: On arrival to the ED, patient was hypertensive at 155/62 with heart rate of 73.  He was saturating at 100% on room air.  He was afebrile at 98.2.  Initial workup notable for WBC of 10.0, hemoglobin 9.7, sodium 133, potassium 5.8 (with hemolysis), bicarb 17, BUN 36, creatinine 1.84 with GFR of 34.  Troponin negative at 12.  Urinalysis with small leukocytes, many bacteria and over 50 WBC/hpf. CT of the head and C-spine was obtained with no evidence of  intracranial or cervical spine injury.  Chest x-ray was obtained that demonstrated findings similar to exam in 2022 including right lung base opacity with elevation of the right hemidiaphragm.  CT chest was subsequently obtained that demonstrated dependent areas of groundglass opacities in the upper lobes bilaterally right greater than left likely representing atelectasis in addition to focal sclerosis thickening of the cortex in the proximal right sixth rib.  Patient started on Rocephin for UTI.  TRH contacted for admission.  As per Dr. Mayford Smith 04/15/22: Pt was medically stable to be d/c back to ALF. For more information, please see previous progress/consult notes.    Discharge Diagnoses:  Principal Problem:   UTI (urinary tract infection) Active Problems:   Altered mental status   Generalized muscle weakness   Dysphagia   CKD (chronic kidney disease)   Chronic anemia   Essential hypertension   BPH (benign prostatic hyperplasia)   CVA (cerebral vascular accident) As per Dr. Fran Smith:   UTI (urinary tract infection) 2/2 E coli Patient is presenting with several day history of increasing confusion and generalized weakness with urinalysis concerning for UTI.   - Urine culture growing E. coli which is resistant to Cipro however sensitive to ceftriaxone. --pt completed 3 days of ceftriaxone   Generalized muscle weakness Per chart review, patient is wheelchair dependent secondary to generalized weakness.  Acute worsening at this time as patient was previously able to transfer independently.  Given lack of focal deficits, low suspicion for CVA.  CT head and C-spine negative -Treatment of UTI as noted above - PT eval rec  home with [facility]   Dysphagia Per billing records from the [place] [facility] [place], patient is being treated for unspecified dysphagia.  CT of the chest obtained demonstrates dependent areas of groundglass attenuation in the upper lobes bilaterally right greater than left.  Likely due to  atelectasis, however chronic silent aspiration possible well.   -SLP evaluation found "grossly functional oropharyngeal swallow and adequate airway protection given advanced age and status of dentition." --dys 3 diet with thin fluids   CKD3b --Cr at baseline     Chronic anemia -Chronic anemia secondary to chronic kidney disease and iron deficiency.  No evidence of bleeding on examination.   Essential hypertension - cont Lisinopril and Lopressor   BPH (benign prostatic hyperplasia) - Continue home tamsulosin and finasteride   CVA (cerebral vascular accident) Per billing records from [place] [facility] [address], patient has a history of CVA. No records in Epic to confirm. Given no records regarding cardiac history, this is the most likely reason for DAPT therapy. Unable to reach family to clarify.    - Continue DAPT and statin    Discharge Instructions  Discharge Instructions     Diet general   Complete by: As directed    Dysphagia III diet, fluid consistency: thin   Discharge instructions   Complete by: As directed    F/u w/ PCP in 1-2 weeks   Increase activity slowly   Complete by: As directed       Allergies as of 04/16/2023       Reactions   Aspirin Nausea And Vomiting   Sulfamethoxazole-trimethoprim    Other reaction(s): Blood Disorder Coagulopathy        Medication List     STOP taking these medications    predniSONE 50 MG tablet Commonly known as: DELTASONE       TAKE these medications    acetaminophen 325 MG tablet Commonly known as: TYLENOL Take 650 mg by mouth every 6 (six) hours as needed for mild pain.   Antacid Liquid 200-200-20 MG/5ML suspension Generic drug: alum & mag hydroxide-simeth Take by mouth 4 (four) times daily as needed for indigestion or heartburn.   Aspirin Low Dose 81 MG tablet Generic drug: aspirin EC Take 81 mg by mouth daily.   atorvastatin 40 MG tablet Commonly known as: LIPITOR Take 40 mg by mouth daily.   clopidogrel 75  MG tablet Commonly known as: PLAVIX Take by mouth.   ferrous sulfate 325 (65 FE) MG EC tablet Take 1 tablet by mouth in the morning and at bedtime.   finasteride 5 MG tablet Commonly known as: PROSCAR Take 1 tablet (5 mg total) by mouth daily.   guaifenesin 100 MG/5ML syrup Commonly known as: ROBITUSSIN Take 300 mg by mouth every 6 (six) hours as needed for cough.   latanoprost 0.005 % ophthalmic solution Commonly known as: XALATAN Place 1 drop into both eyes at bedtime.   lisinopril 40 MG tablet Commonly known as: ZESTRIL Take 40 mg by mouth daily.   loperamide 2 MG tablet Commonly known as: IMODIUM A-D Take 2 mg by mouth 4 (four) times daily as needed for diarrhea or loose stools.   magnesium hydroxide 400 MG/5ML suspension Commonly known as: MILK OF MAGNESIA Take 30 mLs by mouth daily as needed for mild constipation.   metoprolol tartrate 25 MG tablet Commonly known as: LOPRESSOR Take 25 mg by mouth 2 (two) times daily.   tamsulosin 0.4 MG Caps capsule Commonly known as: FLOMAX Take 1 capsule (0.4 mg total) by  mouth daily. At bedtime        Allergies  Allergen Reactions   Aspirin Nausea And Vomiting   Sulfamethoxazole-Trimethoprim     Other reaction(s): Blood Disorder Coagulopathy    Consultations:    Procedures/Studies: CT Chest Wo Contrast  Result Date: 04/11/2023 CLINICAL DATA:  Dyspnea, chronic question pneumonia. EXAM: CT CHEST WITHOUT CONTRAST TECHNIQUE: Multidetector CT imaging of the chest was performed following the standard protocol without IV contrast. RADIATION DOSE REDUCTION: This exam was performed according to the departmental dose-optimization program which includes automated exposure control, adjustment of the mA and/or kV according to patient size and/or use of iterative reconstruction technique. COMPARISON:  One-view chest x-ray 04/11/2023 FINDINGS: Cardiovascular: Heart size is normal. Coronary calcifications are present.  Atherosclerotic calcifications are present at the aortic arch and origins the great vessels. Calcifications are present in the descending thoracic aorta without aneurysm. Mediastinum/Nodes: No enlarged mediastinal or axillary lymph nodes. Thyroid gland, trachea, and esophagus demonstrate no significant findings. Lungs/Pleura: Dependent areas of ground-glass attenuation present the upper lobes bilaterally. Right greater than left lower lobe dependent ground-glass attenuation is present as well. No significant airspace consolidation is present. The airways are patent. No nodule or mass lesion is present. No significant pleural disease is present. Upper Abdomen: Cystic changes in the liver and spleen are similar the prior exam. Musculoskeletal: Vertebral body heights alignment normal. Focal sclerosis thickening of the cortex present in the proximal right sixth rib. No other focal osseous lesions are present. IMPRESSION: 1. Dependent areas of ground-glass attenuation the upper lobes bilaterally and right greater than left lower lobe. This likely represents atelectasis. Coronary artery disease. 2. Focal sclerosis thickening of the cortex in the proximal right sixth rib. This can be seen on the two-view chest x-ray 11/26/2021 in retrospect. It was not present in 2017. May be secondary to prior trauma or degenerative change. Metastatic disease is not excluded. Consider nonemergent follow-up MRI of the thoracic spine without and with contrast if clinically indicated. 3.  Aortic Atherosclerosis (ICD10-I70.0). Electronically Signed   By: Marin Roberts M.D.   On: 04/11/2023 12:54   CT Head Wo Contrast  Result Date: 04/11/2023 CLINICAL DATA:  Neck trauma.  Generalized weakness. EXAM: CT HEAD WITHOUT CONTRAST CT CERVICAL SPINE WITHOUT CONTRAST TECHNIQUE: Multidetector CT imaging of the head and cervical spine was performed following the standard protocol without intravenous contrast. Multiplanar CT image  reconstructions of the cervical spine were also generated. RADIATION DOSE REDUCTION: This exam was performed according to the departmental dose-optimization program which includes automated exposure control, adjustment of the mA and/or kV according to patient size and/or use of iterative reconstruction technique. COMPARISON:  Head CT 08/25/2016 FINDINGS: CT HEAD FINDINGS Brain: No evidence of acute infarction, hemorrhage, hydrocephalus, extra-axial collection or mass lesion/mass effect. Chronic small vessel ischemia in the cerebral white matter. Generalized cerebral volume loss. Vascular: No hyperdense vessel or unexpected calcification. Skull: Normal. Negative for fracture or focal lesion. Sinuses/Orbits: No acute finding CT CERVICAL SPINE FINDINGS Alignment: Normal. Skull base and vertebrae: No acute fracture. No primary bone lesion or focal pathologic process. Soft tissues and spinal canal: No prevertebral fluid or swelling. No visible canal hematoma. Disc levels:  Ordinary and generalized cervical spine degeneration. Upper chest: No visible injury IMPRESSION: No evidence of intracranial or cervical spine injury. Electronically Signed   By: Tiburcio Pea M.D.   On: 04/11/2023 11:57   CT Cervical Spine Wo Contrast  Result Date: 04/11/2023 CLINICAL DATA:  Neck trauma.  Generalized weakness. EXAM: CT  HEAD WITHOUT CONTRAST CT CERVICAL SPINE WITHOUT CONTRAST TECHNIQUE: Multidetector CT imaging of the head and cervical spine was performed following the standard protocol without intravenous contrast. Multiplanar CT image reconstructions of the cervical spine were also generated. RADIATION DOSE REDUCTION: This exam was performed according to the departmental dose-optimization program which includes automated exposure control, adjustment of the mA and/or kV according to patient size and/or use of iterative reconstruction technique. COMPARISON:  Head CT 08/25/2016 FINDINGS: CT HEAD FINDINGS Brain: No evidence of  acute infarction, hemorrhage, hydrocephalus, extra-axial collection or mass lesion/mass effect. Chronic small vessel ischemia in the cerebral white matter. Generalized cerebral volume loss. Vascular: No hyperdense vessel or unexpected calcification. Skull: Normal. Negative for fracture or focal lesion. Sinuses/Orbits: No acute finding CT CERVICAL SPINE FINDINGS Alignment: Normal. Skull base and vertebrae: No acute fracture. No primary bone lesion or focal pathologic process. Soft tissues and spinal canal: No prevertebral fluid or swelling. No visible canal hematoma. Disc levels:  Ordinary and generalized cervical spine degeneration. Upper chest: No visible injury IMPRESSION: No evidence of intracranial or cervical spine injury. Electronically Signed   By: Tiburcio Pea M.D.   On: 04/11/2023 11:57   DG Chest Portable 1 View  Result Date: 04/11/2023 CLINICAL DATA:  Weakness. EXAM: PORTABLE CHEST 1 VIEW COMPARISON:  11/26/2021. FINDINGS: Cardiac silhouette borderline enlarged. No mediastinal or hilar masses. Elevated right hemidiaphragm. Additional opacity at the right lung base is consistent with atelectasis and/or pneumonia. Mild interstitial prominence in the right mid to lower lung. Right upper lung and left lung are clear. No left pleural effusion.  No evidence of a pneumothorax. Skeletal structures are grossly intact. IMPRESSION: 1. Findings similar to the exam from 11/18/2021. Right lung base opacity associated with elevation of the right hemidiaphragm. Lung base opacity suspected to be atelectasis. However, pneumonia should be considered if there are consistent clinical findings. Electronically Signed   By: Amie Portland M.D.   On: 04/11/2023 11:54   (Echo, Carotid, EGD, Colonoscopy, ERCP)    Subjective: Pt denies any complaints   Discharge Exam: Vitals:   04/16/23 0100 04/16/23 0819  BP: (!) 118/54 (!) 151/70  Pulse: 70 71  Resp: 18 18  Temp: 98.7 F (37.1 C) 98 F (36.7 C)  SpO2: 100%  100%   Vitals:   04/15/23 0900 04/15/23 1504 04/16/23 0100 04/16/23 0819  BP:  (!) 136/56 (!) 118/54 (!) 151/70  Pulse:  66 70 71  Resp:  Temp:  98.5 F (36.9 C) 98.7 F (37.1 C) 98 F (36.7 C)  TempSrc:   Oral   SpO2:  98% 100% 100%  Weight: 65.6 kg  64.9 kg   Height:        General: Pt is alert, awake, not in acute distress Cardiovascular:  S1/S2 +, no rubs, no gallops Respiratory: CTA bilaterally, no wheezing, no rhonchi Abdominal: Soft, NT, ND, bowel sounds + Extremities: no edema, no cyanosis    The results of significant diagnostics from this hospitalization (including imaging, microbiology, ancillary and laboratory) are listed below for reference.     Microbiology: Recent Results (from the past 240 hour(s))  Urine Culture     Status: Abnormal   Collection Time: 04/11/23 12:21 PM   Specimen: Urine, Random  Result Value Ref Range Status   Specimen Description   Final    URINE, RANDOM Performed at [facility], [address], [place], [place] [address]    Special Requests   Final    NONE  Performed at [facility], [address] [place], [place] [address]    Culture >=100,000 COLONIES/mL ESCHERICHIA COLI (A)  Final   Report Status 04/13/2023 FINAL  Final   Organism ID, Bacteria ESCHERICHIA COLI (A)  Final      Susceptibility   Escherichia coli - MIC*    AMPICILLIN <=2 SENSITIVE Sensitive     CEFAZOLIN <=4 SENSITIVE Sensitive     CEFEPIME <=0.12 SENSITIVE Sensitive     CEFTRIAXONE <=0.25 SENSITIVE Sensitive     CIPROFLOXACIN >=4 RESISTANT Resistant     GENTAMICIN <=1 SENSITIVE Sensitive     IMIPENEM 1 SENSITIVE Sensitive     NITROFURANTOIN <=16 SENSITIVE Sensitive     TRIMETH/SULFA <=20 SENSITIVE Sensitive     AMPICILLIN/SULBACTAM <=2 SENSITIVE Sensitive     PIP/TAZO <=4 SENSITIVE Sensitive     * >=100,000 COLONIES/mL ESCHERICHIA COLI  MRSA Next Gen by PCR, Nasal     Status: None   Collection Time: 04/11/23  6:10 PM    Specimen: Nasal Mucosa; Nasal Swab  Result Value Ref Range Status   MRSA by PCR Next Gen NOT DETECTED NOT DETECTED Final    Comment: (NOTE) The GeneXpert MRSA Assay (FDA approved for NASAL specimens only), is one component of a comprehensive MRSA colonization surveillance program. It is not intended to diagnose MRSA infection nor to guide or monitor treatment for MRSA infections. Test performance is not FDA approved in patients less than 80 years old. Performed at [facility], [address] [place], [place] [address]      Labs: BNP (last 3 results) No results for input(s): "BNP" in the last 8760 hours. Basic Metabolic Panel: Recent Labs  Lab 04/11/23 1038 04/12/23 0610 04/14/23 0534  NA 133* 134* 132*  K 5.8* 4.3 4.4  CL 107 108 107  CO2 17* 17* 20*  GLUCOSE 97 90 108*  BUN 36* 36* 35*  CREATININE 1.84* 1.95* 1.68*  CALCIUM 8.8* 8.5* 8.4*   Liver Function Tests: Recent Labs  Lab 04/11/23 1038  AST 29  ALT 11  ALKPHOS 62  BILITOT 1.6*  PROT 7.3  ALBUMIN 3.5   No results for input(s): "LIPASE", "AMYLASE" in the last 168 hours. No results for input(s): "AMMONIA" in the last 168 hours. CBC: Recent Labs  Lab 04/11/23 1038 04/12/23 0610 04/14/23 0534  WBC 10.0 22.6* 7.6  NEUTROABS 7.6  --   --   HGB 9.7* 7.9* 8.3*  HCT 31.8* 24.4* 25.9*  MCV 91.4 87.8 88.1  PLT 349 296 319   Cardiac Enzymes: No results for input(s): "CKTOTAL", "CKMB", "CKMBINDEX", "TROPONINI" in the last 168 hours. BNP: Invalid input(s): "POCBNP" CBG: No results for input(s): "GLUCAP" in the last 168 hours. D-Dimer No results for input(s): "DDIMER" in the last 72 hours. Hgb A1c No results for input(s): "HGBA1C" in the last 72 hours. Lipid Profile No results for input(s): "CHOL", "HDL", "LDLCALC", "TRIG", "CHOLHDL", "LDLDIRECT" in the last 72 hours. Thyroid function studies No results for input(s): "TSH", "T4TOTAL", "T3FREE", "THYROIDAB" in the last 72 hours.  Invalid  input(s): "FREET3" Anemia work up No results for input(s): "VITAMINB12", "FOLATE", "FERRITIN", "TIBC", "IRON", "RETICCTPCT" in the last 72 hours. Urinalysis    Component Value Date/Time   COLORURINE YELLOW (A) 04/11/2023 1221   APPEARANCEUR CLOUDY (A) 04/11/2023 1221   APPEARANCEUR Clear 01/16/2016 1500   LABSPEC 1.012 04/11/2023 1221   PHURINE 5.0 04/11/2023 1221   GLUCOSEU NEGATIVE 04/11/2023 1221   HGBUR MODERATE (A) 04/11/2023 1221   BILIRUBINUR NEGATIVE  04/11/2023 1221   BILIRUBINUR Negative 01/16/2016 1500   KETONESUR NEGATIVE 04/11/2023 1221   PROTEINUR 30 (A) 04/11/2023 1221   NITRITE NEGATIVE 04/11/2023 1221   LEUKOCYTESUR SMALL (A) 04/11/2023 1221   Sepsis Labs Recent Labs  Lab 04/11/23 1038 04/12/23 0610 04/14/23 0534  WBC 10.0 22.6* 7.6   Microbiology Recent Results (from the past 240 hour(s))  Urine Culture     Status: Abnormal   Collection Time: 04/11/23 12:21 PM   Specimen: Urine, Random  Result Value Ref Range Status   Specimen Description   Final    URINE, RANDOM Performed at [facility], [address] [place], [place] [address]    Special Requests   Final    NONE Performed at [facility], [address] [place], [place] [address]    Culture >=100,000 COLONIES/mL ESCHERICHIA COLI (A)  Final   Report Status 04/13/2023 FINAL  Final   Organism ID, Bacteria ESCHERICHIA COLI (A)  Final      Susceptibility   Escherichia coli - MIC*    AMPICILLIN <=2 SENSITIVE Sensitive     CEFAZOLIN <=4 SENSITIVE Sensitive     CEFEPIME <=0.12 SENSITIVE Sensitive     CEFTRIAXONE <=0.25 SENSITIVE Sensitive     CIPROFLOXACIN >=4 RESISTANT Resistant     GENTAMICIN <=1 SENSITIVE Sensitive     IMIPENEM 1 SENSITIVE Sensitive     NITROFURANTOIN <=16 SENSITIVE Sensitive     TRIMETH/SULFA <=20 SENSITIVE Sensitive     AMPICILLIN/SULBACTAM <=2 SENSITIVE Sensitive     PIP/TAZO <=4 SENSITIVE Sensitive     * >=100,000 COLONIES/mL ESCHERICHIA COLI  MRSA  Next Gen by PCR, Nasal     Status: None   Collection Time: 04/11/23  6:10 PM   Specimen: Nasal Mucosa; Nasal Swab  Result Value Ref Range Status   MRSA by PCR Next Gen NOT DETECTED NOT DETECTED Final    Comment: (NOTE) The GeneXpert MRSA Assay (FDA approved for NASAL specimens only), is one component of a comprehensive MRSA colonization surveillance program. It is not intended to diagnose MRSA infection nor to guide or monitor treatment for MRSA infections. Test performance is not FDA approved in patients less than 46 years old. Performed at [facility], [address] [place], [place] [address]      Time coordinating discharge: Over 30 minutes  SIGNED:   Charise Killian, MD  [facility] 04/16/2023, 1:33 PM Pager   If 7PM-7AM, please contact night-coverage [email or web address]

## 2023-04-16 NOTE — TOC Progression Note (Signed)
Transition of Care [facility]) - Progression Note    Patient Details  Name: Jon Smith MRN: [id number] Date of Birth: 01-Mar-1928  Transition of Care [facility]) CM/SW Contact  Marlowe Sax, RN Phone Number: 04/16/2023, 8:49 AM  Clinical Narrative:     Ann from the [facility] called and stated that they could transport the patient today they would fit him in for transport, number to call to set up is [phone]   Expected Discharge Plan: Assisted Living Barriers to Discharge: Continued Medical Work up  Expected Discharge Plan and Services       Living arrangements for the past 2 months: Assisted Living Facility                                       Social Determinants of Health (SDOH) Interventions SDOH Screenings   Food Insecurity: No Food Insecurity (04/11/2023)  Housing: Low Risk  (04/11/2023)  Transportation Needs: No Transportation Needs (04/11/2023)  Utilities: Not At Risk (04/11/2023)  Tobacco Use: Medium Risk (04/11/2023)    Readmission Risk Interventions     No data to display

## 2023-04-16 NOTE — TOC Progression Note (Signed)
Transition of Care [facility]) - Progression Note    Patient Details  Name: Jon Smith MRN: [id number] Date of Birth: 1928-05-17  Transition of Care [facility]) CM/SW Contact  Marlowe Sax, RN Phone Number: 04/16/2023, 2:13 PM  Clinical Narrative:     Ann with [facility] called and will transport, I provided her with the nurses desk to call once she is here  Expected Discharge Plan: Assisted Living Barriers to Discharge: Continued Medical Work up  Expected Discharge Plan and Services       Living arrangements for the past 2 months: Assisted Living Facility Expected Discharge Date: 04/16/23                                     Social Determinants of Health (SDOH) Interventions SDOH Screenings   Food Insecurity: No Food Insecurity (04/11/2023)  Housing: Low Risk  (04/11/2023)  Transportation Needs: No Transportation Needs (04/11/2023)  Utilities: Not At Risk (04/11/2023)  Tobacco Use: Medium Risk (04/11/2023)    Readmission Risk Interventions     No data to display

## 2023-04-16 NOTE — Care Management Important Message (Signed)
Important Message  Patient Details  Name: Jon Smith MRN: [id number] Date of Birth: June 04, 1928   Medicare Important Message Given:  Yes     Olegario Messier A Emmarose Klinke 04/16/2023, 9:46 AM

## 2023-04-17 DIAGNOSIS — N401 Enlarged prostate with lower urinary tract symptoms: Secondary | ICD-10-CM | POA: Diagnosis not present

## 2023-04-17 DIAGNOSIS — E785 Hyperlipidemia, unspecified: Secondary | ICD-10-CM | POA: Diagnosis not present

## 2023-04-17 DIAGNOSIS — R4182 Altered mental status, unspecified: Secondary | ICD-10-CM | POA: Diagnosis not present

## 2023-04-17 DIAGNOSIS — I129 Hypertensive chronic kidney disease with stage 1 through stage 4 chronic kidney disease, or unspecified chronic kidney disease: Secondary | ICD-10-CM | POA: Diagnosis not present

## 2023-04-17 DIAGNOSIS — R1310 Dysphagia, unspecified: Secondary | ICD-10-CM | POA: Diagnosis not present

## 2023-04-17 DIAGNOSIS — D509 Iron deficiency anemia, unspecified: Secondary | ICD-10-CM | POA: Diagnosis not present

## 2023-04-17 DIAGNOSIS — N390 Urinary tract infection, site not specified: Secondary | ICD-10-CM | POA: Diagnosis not present

## 2023-04-17 DIAGNOSIS — M6281 Muscle weakness (generalized): Secondary | ICD-10-CM | POA: Diagnosis not present

## 2023-04-17 DIAGNOSIS — D631 Anemia in chronic kidney disease: Secondary | ICD-10-CM | POA: Diagnosis not present

## 2023-04-17 DIAGNOSIS — N1832 Chronic kidney disease, stage 3b: Secondary | ICD-10-CM | POA: Diagnosis not present

## 2023-04-17 DIAGNOSIS — I739 Peripheral vascular disease, unspecified: Secondary | ICD-10-CM | POA: Diagnosis not present

## 2023-04-22 DIAGNOSIS — D508 Other iron deficiency anemias: Secondary | ICD-10-CM | POA: Diagnosis not present

## 2023-04-22 DIAGNOSIS — N400 Enlarged prostate without lower urinary tract symptoms: Secondary | ICD-10-CM | POA: Diagnosis not present

## 2023-04-22 DIAGNOSIS — N1832 Chronic kidney disease, stage 3b: Secondary | ICD-10-CM | POA: Diagnosis not present

## 2023-04-22 DIAGNOSIS — I10 Essential (primary) hypertension: Secondary | ICD-10-CM | POA: Diagnosis not present

## 2023-04-22 DIAGNOSIS — N390 Urinary tract infection, site not specified: Secondary | ICD-10-CM | POA: Diagnosis not present

## 2023-04-29 ENCOUNTER — Ambulatory Visit (INDEPENDENT_AMBULATORY_CARE_PROVIDER_SITE_OTHER): Payer: Medicare HMO | Admitting: Urology

## 2023-04-29 VITALS — BP 170/74 | Ht 69.0 in | Wt 143.0 lb

## 2023-04-29 DIAGNOSIS — Z8546 Personal history of malignant neoplasm of prostate: Secondary | ICD-10-CM

## 2023-04-29 DIAGNOSIS — R8271 Bacteriuria: Secondary | ICD-10-CM | POA: Diagnosis not present

## 2023-04-29 DIAGNOSIS — R339 Retention of urine, unspecified: Secondary | ICD-10-CM

## 2023-04-29 DIAGNOSIS — N401 Enlarged prostate with lower urinary tract symptoms: Secondary | ICD-10-CM

## 2023-04-29 LAB — BLADDER SCAN AMB NON-IMAGING: Scan Result: 275

## 2023-04-29 MED ORDER — TAMSULOSIN HCL 0.4 MG PO CAPS: 0.4000 mg | ORAL_CAPSULE | Freq: Every day | ORAL | 3 refills | Status: DC

## 2023-04-29 MED ORDER — FINASTERIDE 5 MG PO TABS: 5.0000 mg | ORAL_TABLET | Freq: Every day | ORAL | 3 refills | Status: DC

## 2023-04-29 NOTE — Progress Notes (Incomplete)
I, Amy L Pierron,acting as a scribe for Jon Scotland, MD.,have documented all relevant documentation on the behalf of Jon Scotland, MD,as directed by  Jon Scotland, MD while in the presence of Jon Scotland, MD.  04/29/2023 3:14 PM   Jon Smith Aug 17, 1928 [id number]  Referring provider: [facility] [address] [place],  [place] [address]  Chief Complaint  Patient presents with   Benign Prostatic Hypertrophy    HPI: 89 year-old male with a personal history of multiple issues including: CKD, nephrolithiasis, gross hematuria, BPH with incomplete bladder emptying, elevated PSA, asymptomatic bacteriuria, and prostate cancer returns today for a routine annual follow-up.  Notably, he also has a personal history of elevated PVR's in the 150 to 250 range. He's managed on Axonics therapy in the form of Flomax and Finasteride. He is s/p cryotherapy for prostate cancer with a stable PSA. His most recent PSA on April 24, 2022, was 1.6. He was admitted to the hospital on 04/11/23 with symptoms of increasing confusion, generalized weakness, and chronic bacteria related to possible UTI.  He is doing well overall with no new symptoms or complaints.   Results for orders placed or performed in visit on 04/29/23  Bladder Scan (Post Void Residual) in office  Result Value Ref Range   Scan Result 275 ml     PMH: Past Medical History:  Diagnosis Date   Back pain    Elevated LFTs    Hepatitis    HLD (hyperlipidemia)    Hypertension    Incomplete bladder emptying    Lower extremity ulceration (HCC)    Nocturia    Peripheral arterial disease (HCC)    Prostate cancer (HCC) remote   Cryotherapy by Dr. Orson Slick.  PSA 5.54 in September at [facility]   Renal stones     Surgical History: Past Surgical History:  Procedure Laterality Date   AORTOGRAM     PERITONEAL CATHETER INSERTION     PROSTATE SURGERY     TRANSLUMINAL ANGIOPLASTY      Home Medications:  Allergies as of  04/29/2023       Reactions   Aspirin Nausea And Vomiting   Sulfamethoxazole-trimethoprim    Other reaction(s): Blood Disorder Coagulopathy        Medication List        Accurate as of April 29, 2023  3:14 PM. If you have any questions, ask your nurse or doctor.          acetaminophen 325 MG tablet Commonly known as: TYLENOL Take 650 mg by mouth every 6 (six) hours as needed for mild pain.   Antacid Liquid 200-200-20 MG/5ML suspension Generic drug: alum & mag hydroxide-simeth Take by mouth 4 (four) times daily as needed for indigestion or heartburn.   Aspirin Low Dose 81 MG tablet Generic drug: aspirin EC Take 81 mg by mouth daily.   atorvastatin 40 MG tablet Commonly known as: LIPITOR Take 40 mg by mouth daily.   clopidogrel 75 MG tablet Commonly known as: PLAVIX Take by mouth.   ferrous sulfate 325 (65 FE) MG EC tablet Take 1 tablet by mouth in the morning and at bedtime.   finasteride 5 MG tablet Commonly known as: PROSCAR Take 1 tablet (5 mg total) by mouth daily.   guaifenesin 100 MG/5ML syrup Commonly known as: ROBITUSSIN Take 300 mg by mouth every 6 (six) hours as needed for cough.   latanoprost 0.005 % ophthalmic solution Commonly known as: XALATAN Place 1 drop into both eyes at  bedtime.   lisinopril 40 MG tablet Commonly known as: ZESTRIL Take 40 mg by mouth daily.   loperamide 2 MG tablet Commonly known as: IMODIUM A-D Take 2 mg by mouth 4 (four) times daily as needed for diarrhea or loose stools.   magnesium hydroxide 400 MG/5ML suspension Commonly known as: MILK OF MAGNESIA Take 30 mLs by mouth daily as needed for mild constipation.   metoprolol tartrate 25 MG tablet Commonly known as: LOPRESSOR Take 25 mg by mouth 2 (two) times daily.   tamsulosin 0.4 MG Caps capsule Commonly known as: FLOMAX Take 1 capsule (0.4 mg total) by mouth daily. At bedtime        Allergies:  Allergies  Allergen Reactions   Aspirin Nausea And  Vomiting   Sulfamethoxazole-Trimethoprim     Other reaction(s): Blood Disorder Coagulopathy    Family History: Family History  Problem Relation Age of Onset   Heart disease Brother    Kidney Stones Sister    Heart attack Sister    Heart attack Brother    Kidney disease Neg Hx    Prostate cancer Neg Hx     Social History:  reports that he has quit smoking. His smoking use included cigarettes. He has never used smokeless tobacco. He reports that he does not drink alcohol and does not use drugs.   Physical Exam: BP (!) 170/74   Ht 5\' 9"  (1.753 m)   Wt 143 lb (64.9 kg)   BMI 21.12 kg/m   Constitutional:  Alert and oriented, No acute distress. HEENT: [place] AT, moist mucus membranes.  Trachea midline, no masses. Neurologic: Grossly intact, no focal deficits, moving all 4 extremities. Psychiatric: Normal mood and affect.   Assessment & Plan:    BPH with incomplete bladder emptying  - Continue Axonics therapy.  - PVR remains stably elevated. Creatinine is stable.  - Will continue to follow him.   2. Chronic bacteriuria  - Only treat when he is symptomatic.   -  It's unclear whether or not his recent admission was really secondary to UTI or whether they just treated his chronic bacteriuria. Symptoms were nonspecific.  3. History of prostate cancer  - His PSA has been stable.  - Given his age and comorbidities, would abstain from further investigation.  Return in about 1 year (around 04/28/2024) for routine follow up.   [facility] [address] [place], [place] [address] [phone]

## 2023-05-19 DIAGNOSIS — N400 Enlarged prostate without lower urinary tract symptoms: Secondary | ICD-10-CM | POA: Diagnosis not present

## 2023-05-19 DIAGNOSIS — E785 Hyperlipidemia, unspecified: Secondary | ICD-10-CM | POA: Diagnosis not present

## 2023-05-19 DIAGNOSIS — I70223 Atherosclerosis of native arteries of extremities with rest pain, bilateral legs: Secondary | ICD-10-CM | POA: Diagnosis not present

## 2023-05-19 DIAGNOSIS — E119 Type 2 diabetes mellitus without complications: Secondary | ICD-10-CM | POA: Diagnosis not present

## 2023-05-19 DIAGNOSIS — E038 Other specified hypothyroidism: Secondary | ICD-10-CM | POA: Diagnosis not present

## 2023-05-19 DIAGNOSIS — Z79899 Other long term (current) drug therapy: Secondary | ICD-10-CM | POA: Diagnosis not present

## 2023-05-19 DIAGNOSIS — E782 Mixed hyperlipidemia: Secondary | ICD-10-CM | POA: Diagnosis not present

## 2023-05-19 DIAGNOSIS — E559 Vitamin D deficiency, unspecified: Secondary | ICD-10-CM | POA: Diagnosis not present

## 2023-05-19 DIAGNOSIS — I639 Cerebral infarction, unspecified: Secondary | ICD-10-CM | POA: Diagnosis not present

## 2023-05-19 DIAGNOSIS — D518 Other vitamin B12 deficiency anemias: Secondary | ICD-10-CM | POA: Diagnosis not present

## 2023-05-19 DIAGNOSIS — N189 Chronic kidney disease, unspecified: Secondary | ICD-10-CM | POA: Diagnosis not present

## 2023-05-19 DIAGNOSIS — I10 Essential (primary) hypertension: Secondary | ICD-10-CM | POA: Diagnosis not present

## 2023-05-20 DIAGNOSIS — N1832 Chronic kidney disease, stage 3b: Secondary | ICD-10-CM | POA: Diagnosis not present

## 2023-05-20 DIAGNOSIS — D508 Other iron deficiency anemias: Secondary | ICD-10-CM | POA: Diagnosis not present

## 2023-05-20 DIAGNOSIS — I639 Cerebral infarction, unspecified: Secondary | ICD-10-CM | POA: Diagnosis not present

## 2023-05-20 DIAGNOSIS — E785 Hyperlipidemia, unspecified: Secondary | ICD-10-CM | POA: Diagnosis not present

## 2023-05-20 DIAGNOSIS — N400 Enlarged prostate without lower urinary tract symptoms: Secondary | ICD-10-CM | POA: Diagnosis not present

## 2023-05-20 DIAGNOSIS — R2681 Unsteadiness on feet: Secondary | ICD-10-CM | POA: Diagnosis not present

## 2023-05-20 DIAGNOSIS — I10 Essential (primary) hypertension: Secondary | ICD-10-CM | POA: Diagnosis not present

## 2023-05-28 DIAGNOSIS — I10 Essential (primary) hypertension: Secondary | ICD-10-CM | POA: Diagnosis not present

## 2023-06-04 DIAGNOSIS — N390 Urinary tract infection, site not specified: Secondary | ICD-10-CM | POA: Diagnosis not present

## 2023-06-17 DIAGNOSIS — I70223 Atherosclerosis of native arteries of extremities with rest pain, bilateral legs: Secondary | ICD-10-CM | POA: Diagnosis not present

## 2023-06-17 DIAGNOSIS — I10 Essential (primary) hypertension: Secondary | ICD-10-CM | POA: Diagnosis not present

## 2023-06-17 DIAGNOSIS — I639 Cerebral infarction, unspecified: Secondary | ICD-10-CM | POA: Diagnosis not present

## 2023-06-17 DIAGNOSIS — E785 Hyperlipidemia, unspecified: Secondary | ICD-10-CM | POA: Diagnosis not present

## 2023-06-17 DIAGNOSIS — N189 Chronic kidney disease, unspecified: Secondary | ICD-10-CM | POA: Diagnosis not present

## 2023-06-17 DIAGNOSIS — E559 Vitamin D deficiency, unspecified: Secondary | ICD-10-CM | POA: Diagnosis not present

## 2023-06-17 DIAGNOSIS — D518 Other vitamin B12 deficiency anemias: Secondary | ICD-10-CM | POA: Diagnosis not present

## 2023-06-17 DIAGNOSIS — N400 Enlarged prostate without lower urinary tract symptoms: Secondary | ICD-10-CM | POA: Diagnosis not present

## 2023-06-17 DIAGNOSIS — E038 Other specified hypothyroidism: Secondary | ICD-10-CM | POA: Diagnosis not present

## 2023-06-17 DIAGNOSIS — E119 Type 2 diabetes mellitus without complications: Secondary | ICD-10-CM | POA: Diagnosis not present

## 2023-06-22 NOTE — Progress Notes (Deleted)
Celso Amy, PA-C [address]  [address]  [place], [place] [address]  Main: [phone]  Fax: [phone]   Gastroenterology Consultation  Referring Provider:     [facility] Primary Care Physician:  [name], NP Primary Gastroenterologist:  *** Reason for Consultation:     Anemia        HPI:   Jon Smith is a 89 y.o. y/o male referred for consultation & management  by [name], NP.   He is referred to evaluate anemia.  Hospital follow-up.He was admitted to [facility] 04/11/2023 until 04/16/2023 for acute UTI, altered mental status, generalized muscle weakness.  E. coli UTI treated with ceftriaxone.  History of chronic anemia.  History of unspecified dysphagia.  Had SLP evaluation showing grossly functional oropharyngeal swallow and adequate airway protection given advanced age and status of dentition.  Dysphagia 3 diet with thin fluids.  Found to have chronic anemia secondary to chronic kidney disease and iron deficiency.  No evidence of bleeding on exam.  Started on ferrous sulfate iron tablet daily.  Medical history significant for history of CVA, PAD, hypertension, prostate cancer, currently on aspirin and Plavix.  History of CKD, and chronic muscle weakness currently wheelchair-bound.  Resident at [facility].  Labs 04/14/2023 showed hemoglobin 8.3, hematocrit 25.9, MCV 88, platelets 319.  Moderate chronic kidney disease with GFR 38, BUN 35, creatinine 1.68.  No previous GI evaluation, EGD, or colonoscopy.  Last abdominal pelvic CT with contrast 02/2021 showed no acute abnormality.  There was a 3.7 cm infrarenal abdominal aortic aneurysm increased in size since 2016.  2-year repeat surveillance CT was recommended.  Multiple hepatic cysts.  No other GI abnormality.   Past Medical History:  Diagnosis Date   Back pain    Elevated LFTs    Hepatitis    HLD (hyperlipidemia)    Hypertension    Incomplete bladder emptying    Lower  extremity ulceration (HCC)    Nocturia    Peripheral arterial disease (HCC)    Prostate cancer (HCC) remote   Cryotherapy by Dr. Orson Slick.  PSA 5.54 in September at [facility]   Renal stones     Past Surgical History:  Procedure Laterality Date   AORTOGRAM     PERITONEAL CATHETER INSERTION     PROSTATE SURGERY     TRANSLUMINAL ANGIOPLASTY      Prior to Admission medications   Medication Sig Start Date End Date Taking? Authorizing Provider  acetaminophen (TYLENOL) 325 MG tablet Take 650 mg by mouth every 6 (six) hours as needed for mild pain.    [provider]  alum & mag hydroxide-simeth (ANTACID LIQUID) 200-200-20 MG/5ML suspension Take by mouth 4 (four) times daily as needed for indigestion or heartburn.    [provider]  ASPIRIN LOW DOSE 81 MG EC tablet Take 81 mg by mouth daily. 03/26/21   [provider]  atorvastatin (LIPITOR) 40 MG tablet Take 40 mg by mouth daily. 10/12/20   [provider]  clopidogrel (PLAVIX) 75 MG tablet Take by mouth. 08/02/20   [provider]  ferrous sulfate 325 (65 FE) MG EC tablet Take 1 tablet by mouth in the morning and at bedtime. 02/26/21   [provider]  finasteride (PROSCAR) 5 MG tablet Take 1 tablet (5 mg total) by mouth daily. 04/29/23   Vanna Scotland, MD  guaifenesin (ROBITUSSIN) 100 MG/5ML syrup Take 300 mg by mouth every 6 (six) hours as needed for cough.  [provider]  latanoprost (XALATAN) 0.005 % ophthalmic solution Place 1 drop into both eyes at bedtime. 09/23/15   [provider]  lisinopril (ZESTRIL) 40 MG tablet Take 40 mg by mouth daily. 04/01/22   [provider]  loperamide (IMODIUM A-D) 2 MG tablet Take 2 mg by mouth 4 (four) times daily as needed for diarrhea or loose stools.    [provider]  magnesium hydroxide (MILK OF MAGNESIA) 400 MG/5ML suspension Take 30 mLs by mouth daily as needed for mild constipation.    [provider]   metoprolol tartrate (LOPRESSOR) 25 MG tablet Take 25 mg by mouth 2 (two) times daily. 10/12/20   [provider]  tamsulosin (FLOMAX) 0.4 MG CAPS capsule Take 1 capsule (0.4 mg total) by mouth daily. At bedtime 04/29/23   Vanna Scotland, MD    Family History  Problem Relation Age of Onset   Heart disease Brother    Kidney Stones Sister    Heart attack Sister    Heart attack Brother    Kidney disease Neg Hx    Prostate cancer Neg Hx      Social History   Tobacco Use   Smoking status: Former    Types: Cigarettes   Smokeless tobacco: Never   Tobacco comments:    quit 10 days  Substance Use Topics   Alcohol use: No    Alcohol/week: 0.0 standard drinks of alcohol   Drug use: No    Allergies as of 06/23/2023 - Review Complete 04/11/2023  Allergen Reaction Noted   Aspirin Nausea And Vomiting 08/30/2015   Sulfamethoxazole-trimethoprim  01/09/2016    Review of Systems:    All systems reviewed and negative except where noted in HPI.   Physical Exam:  There were no vitals taken for this visit. No LMP for male patient. Psych:  Alert and cooperative. Normal mood and affect. General:   Alert,  Well-developed, well-nourished, pleasant and cooperative in NAD Head:  Normocephalic and atraumatic. Eyes:  Sclera clear, no icterus.   Conjunctiva pink. Neck:  Supple; no masses or thyromegaly. Lungs:  Respirations even and unlabored.  Clear throughout to auscultation.   No wheezes, crackles, or rhonchi. No acute distress. Heart:  Regular rate and rhythm; no murmurs, clicks, rubs, or gallops. Abdomen:  Normal bowel sounds.  No bruits.  Soft, and non-distended without masses, hepatosplenomegaly or hernias noted.  No Tenderness.  No guarding or rebound tenderness.    Neurologic:  Alert and oriented x3;  grossly normal neurologically. Psych:  Alert and cooperative. Normal mood and affect.  Imaging Studies: No results found.  Assessment and Plan:   Jon Smith is a 89 y.o.  y/o male has been referred for Anemia.  Chronic anemia likely multifactorial.  History of chronic kidney disease.  Has advanced age with multiple comorbidities.  Currently on aspirin and Plavix.  EGD and colonoscopy procedures are not recommended due to increased risk of procedures with advanced age and comorbidities.  1.  Anemia  Labs: CBC, iron panel, ferritin, B12, folate, and celiac lab.  2.  Iron deficiency  Continue iron daily.  Monitor labs. 3.  Chronic kidney disease  Lab: BMP 4.  Infrarenal aortic aneurysm  Scheduling follow-up CT abdomen and pelvis without contrast  Follow up in ***  Celso Amy, PA-C    BP check ***

## 2023-06-23 ENCOUNTER — Ambulatory Visit: Payer: Medicare HMO | Admitting: Physician Assistant

## 2023-06-23 ENCOUNTER — Encounter: Payer: Self-pay | Admitting: Physician Assistant

## 2023-06-28 DIAGNOSIS — I10 Essential (primary) hypertension: Secondary | ICD-10-CM | POA: Diagnosis not present

## 2023-07-14 DIAGNOSIS — E038 Other specified hypothyroidism: Secondary | ICD-10-CM | POA: Diagnosis not present

## 2023-07-14 DIAGNOSIS — I10 Essential (primary) hypertension: Secondary | ICD-10-CM | POA: Diagnosis not present

## 2023-07-14 DIAGNOSIS — E785 Hyperlipidemia, unspecified: Secondary | ICD-10-CM | POA: Diagnosis not present

## 2023-07-14 DIAGNOSIS — E119 Type 2 diabetes mellitus without complications: Secondary | ICD-10-CM | POA: Diagnosis not present

## 2023-07-14 DIAGNOSIS — N400 Enlarged prostate without lower urinary tract symptoms: Secondary | ICD-10-CM | POA: Diagnosis not present

## 2023-07-14 DIAGNOSIS — E559 Vitamin D deficiency, unspecified: Secondary | ICD-10-CM | POA: Diagnosis not present

## 2023-07-14 DIAGNOSIS — N189 Chronic kidney disease, unspecified: Secondary | ICD-10-CM | POA: Diagnosis not present

## 2023-07-14 DIAGNOSIS — I70223 Atherosclerosis of native arteries of extremities with rest pain, bilateral legs: Secondary | ICD-10-CM | POA: Diagnosis not present

## 2023-07-14 DIAGNOSIS — I639 Cerebral infarction, unspecified: Secondary | ICD-10-CM | POA: Diagnosis not present

## 2023-07-14 DIAGNOSIS — D518 Other vitamin B12 deficiency anemias: Secondary | ICD-10-CM | POA: Diagnosis not present

## 2023-07-28 DIAGNOSIS — I10 Essential (primary) hypertension: Secondary | ICD-10-CM | POA: Diagnosis not present

## 2023-08-18 DIAGNOSIS — E559 Vitamin D deficiency, unspecified: Secondary | ICD-10-CM | POA: Diagnosis not present

## 2023-08-18 DIAGNOSIS — Z79899 Other long term (current) drug therapy: Secondary | ICD-10-CM | POA: Diagnosis not present

## 2023-08-18 DIAGNOSIS — E119 Type 2 diabetes mellitus without complications: Secondary | ICD-10-CM | POA: Diagnosis not present

## 2023-08-18 DIAGNOSIS — D518 Other vitamin B12 deficiency anemias: Secondary | ICD-10-CM | POA: Diagnosis not present

## 2023-08-18 DIAGNOSIS — E038 Other specified hypothyroidism: Secondary | ICD-10-CM | POA: Diagnosis not present

## 2023-08-18 DIAGNOSIS — E782 Mixed hyperlipidemia: Secondary | ICD-10-CM | POA: Diagnosis not present

## 2023-08-22 DIAGNOSIS — I70223 Atherosclerosis of native arteries of extremities with rest pain, bilateral legs: Secondary | ICD-10-CM | POA: Diagnosis not present

## 2023-08-22 DIAGNOSIS — D518 Other vitamin B12 deficiency anemias: Secondary | ICD-10-CM | POA: Diagnosis not present

## 2023-08-22 DIAGNOSIS — E559 Vitamin D deficiency, unspecified: Secondary | ICD-10-CM | POA: Diagnosis not present

## 2023-08-22 DIAGNOSIS — I639 Cerebral infarction, unspecified: Secondary | ICD-10-CM | POA: Diagnosis not present

## 2023-08-22 DIAGNOSIS — N189 Chronic kidney disease, unspecified: Secondary | ICD-10-CM | POA: Diagnosis not present

## 2023-08-22 DIAGNOSIS — D508 Other iron deficiency anemias: Secondary | ICD-10-CM | POA: Diagnosis not present

## 2023-08-22 DIAGNOSIS — E119 Type 2 diabetes mellitus without complications: Secondary | ICD-10-CM | POA: Diagnosis not present

## 2023-08-22 DIAGNOSIS — E785 Hyperlipidemia, unspecified: Secondary | ICD-10-CM | POA: Diagnosis not present

## 2023-08-22 DIAGNOSIS — I10 Essential (primary) hypertension: Secondary | ICD-10-CM | POA: Diagnosis not present

## 2023-08-22 DIAGNOSIS — N400 Enlarged prostate without lower urinary tract symptoms: Secondary | ICD-10-CM | POA: Diagnosis not present

## 2023-08-22 DIAGNOSIS — E038 Other specified hypothyroidism: Secondary | ICD-10-CM | POA: Diagnosis not present

## 2023-08-25 DIAGNOSIS — Z79899 Other long term (current) drug therapy: Secondary | ICD-10-CM | POA: Diagnosis not present

## 2023-08-25 DIAGNOSIS — E119 Type 2 diabetes mellitus without complications: Secondary | ICD-10-CM | POA: Diagnosis not present

## 2023-08-25 DIAGNOSIS — D518 Other vitamin B12 deficiency anemias: Secondary | ICD-10-CM | POA: Diagnosis not present

## 2023-08-25 DIAGNOSIS — E782 Mixed hyperlipidemia: Secondary | ICD-10-CM | POA: Diagnosis not present

## 2023-08-26 DIAGNOSIS — R195 Other fecal abnormalities: Secondary | ICD-10-CM | POA: Diagnosis not present

## 2023-08-28 DIAGNOSIS — I10 Essential (primary) hypertension: Secondary | ICD-10-CM | POA: Diagnosis not present

## 2023-09-09 DIAGNOSIS — I7091 Generalized atherosclerosis: Secondary | ICD-10-CM | POA: Diagnosis not present

## 2023-09-19 DIAGNOSIS — E119 Type 2 diabetes mellitus without complications: Secondary | ICD-10-CM | POA: Diagnosis not present

## 2023-09-19 DIAGNOSIS — I10 Essential (primary) hypertension: Secondary | ICD-10-CM | POA: Diagnosis not present

## 2023-09-19 DIAGNOSIS — E559 Vitamin D deficiency, unspecified: Secondary | ICD-10-CM | POA: Diagnosis not present

## 2023-09-19 DIAGNOSIS — I70223 Atherosclerosis of native arteries of extremities with rest pain, bilateral legs: Secondary | ICD-10-CM | POA: Diagnosis not present

## 2023-09-19 DIAGNOSIS — N189 Chronic kidney disease, unspecified: Secondary | ICD-10-CM | POA: Diagnosis not present

## 2023-09-19 DIAGNOSIS — D508 Other iron deficiency anemias: Secondary | ICD-10-CM | POA: Diagnosis not present

## 2023-09-19 DIAGNOSIS — D518 Other vitamin B12 deficiency anemias: Secondary | ICD-10-CM | POA: Diagnosis not present

## 2023-09-19 DIAGNOSIS — I639 Cerebral infarction, unspecified: Secondary | ICD-10-CM | POA: Diagnosis not present

## 2023-09-19 DIAGNOSIS — E785 Hyperlipidemia, unspecified: Secondary | ICD-10-CM | POA: Diagnosis not present

## 2023-09-19 DIAGNOSIS — N400 Enlarged prostate without lower urinary tract symptoms: Secondary | ICD-10-CM | POA: Diagnosis not present

## 2023-09-19 DIAGNOSIS — E038 Other specified hypothyroidism: Secondary | ICD-10-CM | POA: Diagnosis not present

## 2023-09-28 DIAGNOSIS — I10 Essential (primary) hypertension: Secondary | ICD-10-CM | POA: Diagnosis not present

## 2023-10-07 DIAGNOSIS — R011 Cardiac murmur, unspecified: Secondary | ICD-10-CM | POA: Diagnosis not present

## 2023-10-17 DIAGNOSIS — D518 Other vitamin B12 deficiency anemias: Secondary | ICD-10-CM | POA: Diagnosis not present

## 2023-10-17 DIAGNOSIS — E785 Hyperlipidemia, unspecified: Secondary | ICD-10-CM | POA: Diagnosis not present

## 2023-10-17 DIAGNOSIS — N400 Enlarged prostate without lower urinary tract symptoms: Secondary | ICD-10-CM | POA: Diagnosis not present

## 2023-10-17 DIAGNOSIS — I639 Cerebral infarction, unspecified: Secondary | ICD-10-CM | POA: Diagnosis not present

## 2023-10-17 DIAGNOSIS — E119 Type 2 diabetes mellitus without complications: Secondary | ICD-10-CM | POA: Diagnosis not present

## 2023-10-17 DIAGNOSIS — N189 Chronic kidney disease, unspecified: Secondary | ICD-10-CM | POA: Diagnosis not present

## 2023-10-17 DIAGNOSIS — E038 Other specified hypothyroidism: Secondary | ICD-10-CM | POA: Diagnosis not present

## 2023-10-17 DIAGNOSIS — E559 Vitamin D deficiency, unspecified: Secondary | ICD-10-CM | POA: Diagnosis not present

## 2023-10-17 DIAGNOSIS — I10 Essential (primary) hypertension: Secondary | ICD-10-CM | POA: Diagnosis not present

## 2023-10-17 DIAGNOSIS — I70223 Atherosclerosis of native arteries of extremities with rest pain, bilateral legs: Secondary | ICD-10-CM | POA: Diagnosis not present

## 2023-10-17 DIAGNOSIS — D508 Other iron deficiency anemias: Secondary | ICD-10-CM | POA: Diagnosis not present

## 2023-10-28 DIAGNOSIS — I10 Essential (primary) hypertension: Secondary | ICD-10-CM | POA: Diagnosis not present

## 2023-10-31 DIAGNOSIS — R0989 Other specified symptoms and signs involving the circulatory and respiratory systems: Secondary | ICD-10-CM | POA: Diagnosis not present

## 2023-10-31 DIAGNOSIS — I6523 Occlusion and stenosis of bilateral carotid arteries: Secondary | ICD-10-CM | POA: Diagnosis not present

## 2023-11-03 DIAGNOSIS — I77811 Abdominal aortic ectasia: Secondary | ICD-10-CM | POA: Diagnosis not present

## 2023-11-05 DIAGNOSIS — I70223 Atherosclerosis of native arteries of extremities with rest pain, bilateral legs: Secondary | ICD-10-CM | POA: Diagnosis not present

## 2023-11-07 DIAGNOSIS — R3915 Urgency of urination: Secondary | ICD-10-CM | POA: Diagnosis not present

## 2023-11-07 DIAGNOSIS — R2242 Localized swelling, mass and lump, left lower limb: Secondary | ICD-10-CM | POA: Diagnosis not present

## 2023-11-07 DIAGNOSIS — R8290 Unspecified abnormal findings in urine: Secondary | ICD-10-CM | POA: Diagnosis not present

## 2023-11-07 DIAGNOSIS — R4181 Age-related cognitive decline: Secondary | ICD-10-CM | POA: Diagnosis not present

## 2023-11-10 DIAGNOSIS — R4181 Age-related cognitive decline: Secondary | ICD-10-CM | POA: Diagnosis not present

## 2023-11-10 DIAGNOSIS — R451 Restlessness and agitation: Secondary | ICD-10-CM | POA: Diagnosis not present

## 2023-11-10 DIAGNOSIS — T451X1A Poisoning by antineoplastic and immunosuppressive drugs, accidental (unintentional), initial encounter: Secondary | ICD-10-CM | POA: Diagnosis not present

## 2023-11-10 DIAGNOSIS — R590 Localized enlarged lymph nodes: Secondary | ICD-10-CM | POA: Diagnosis not present

## 2023-11-12 DIAGNOSIS — R221 Localized swelling, mass and lump, neck: Secondary | ICD-10-CM | POA: Diagnosis not present

## 2023-11-12 DIAGNOSIS — E042 Nontoxic multinodular goiter: Secondary | ICD-10-CM | POA: Diagnosis not present

## 2023-11-17 DIAGNOSIS — R8290 Unspecified abnormal findings in urine: Secondary | ICD-10-CM | POA: Diagnosis not present

## 2023-11-17 DIAGNOSIS — N400 Enlarged prostate without lower urinary tract symptoms: Secondary | ICD-10-CM | POA: Diagnosis not present

## 2023-11-17 DIAGNOSIS — R4181 Age-related cognitive decline: Secondary | ICD-10-CM | POA: Diagnosis not present

## 2023-11-17 DIAGNOSIS — R3915 Urgency of urination: Secondary | ICD-10-CM | POA: Diagnosis not present

## 2023-11-17 DIAGNOSIS — N390 Urinary tract infection, site not specified: Secondary | ICD-10-CM | POA: Diagnosis not present

## 2023-11-19 DIAGNOSIS — N400 Enlarged prostate without lower urinary tract symptoms: Secondary | ICD-10-CM | POA: Diagnosis not present

## 2023-11-19 DIAGNOSIS — I10 Essential (primary) hypertension: Secondary | ICD-10-CM | POA: Diagnosis not present

## 2023-11-19 DIAGNOSIS — D518 Other vitamin B12 deficiency anemias: Secondary | ICD-10-CM | POA: Diagnosis not present

## 2023-11-19 DIAGNOSIS — I639 Cerebral infarction, unspecified: Secondary | ICD-10-CM | POA: Diagnosis not present

## 2023-11-19 DIAGNOSIS — E559 Vitamin D deficiency, unspecified: Secondary | ICD-10-CM | POA: Diagnosis not present

## 2023-11-19 DIAGNOSIS — N189 Chronic kidney disease, unspecified: Secondary | ICD-10-CM | POA: Diagnosis not present

## 2023-11-19 DIAGNOSIS — E038 Other specified hypothyroidism: Secondary | ICD-10-CM | POA: Diagnosis not present

## 2023-11-19 DIAGNOSIS — E7849 Other hyperlipidemia: Secondary | ICD-10-CM | POA: Diagnosis not present

## 2023-11-19 DIAGNOSIS — E119 Type 2 diabetes mellitus without complications: Secondary | ICD-10-CM | POA: Diagnosis not present

## 2023-11-19 DIAGNOSIS — I70223 Atherosclerosis of native arteries of extremities with rest pain, bilateral legs: Secondary | ICD-10-CM | POA: Diagnosis not present

## 2023-11-24 DIAGNOSIS — D519 Vitamin B12 deficiency anemia, unspecified: Secondary | ICD-10-CM | POA: Diagnosis not present

## 2023-11-24 DIAGNOSIS — D508 Other iron deficiency anemias: Secondary | ICD-10-CM | POA: Diagnosis not present

## 2023-11-24 DIAGNOSIS — I10 Essential (primary) hypertension: Secondary | ICD-10-CM | POA: Diagnosis not present

## 2023-11-24 DIAGNOSIS — Z79899 Other long term (current) drug therapy: Secondary | ICD-10-CM | POA: Diagnosis not present

## 2023-11-24 DIAGNOSIS — N400 Enlarged prostate without lower urinary tract symptoms: Secondary | ICD-10-CM | POA: Diagnosis not present

## 2023-11-24 DIAGNOSIS — E785 Hyperlipidemia, unspecified: Secondary | ICD-10-CM | POA: Diagnosis not present

## 2023-11-24 DIAGNOSIS — R451 Restlessness and agitation: Secondary | ICD-10-CM | POA: Diagnosis not present

## 2023-11-24 DIAGNOSIS — E119 Type 2 diabetes mellitus without complications: Secondary | ICD-10-CM | POA: Diagnosis not present

## 2023-11-24 DIAGNOSIS — E782 Mixed hyperlipidemia: Secondary | ICD-10-CM | POA: Diagnosis not present

## 2023-11-24 DIAGNOSIS — N189 Chronic kidney disease, unspecified: Secondary | ICD-10-CM | POA: Diagnosis not present

## 2023-11-24 DIAGNOSIS — R2681 Unsteadiness on feet: Secondary | ICD-10-CM | POA: Diagnosis not present

## 2023-11-24 DIAGNOSIS — R4181 Age-related cognitive decline: Secondary | ICD-10-CM | POA: Diagnosis not present

## 2023-11-24 DIAGNOSIS — E038 Other specified hypothyroidism: Secondary | ICD-10-CM | POA: Diagnosis not present

## 2023-11-24 DIAGNOSIS — N179 Acute kidney failure, unspecified: Secondary | ICD-10-CM | POA: Diagnosis not present

## 2023-11-25 ENCOUNTER — Other Ambulatory Visit: Payer: Self-pay

## 2023-11-25 ENCOUNTER — Inpatient Hospital Stay
Admission: EM | Admit: 2023-11-25 | Discharge: 2023-11-29 | DRG: 812 | Disposition: A | Discharge status: Home / Self Care | Payer: Medicare HMO | Source: Skilled Nursing Facility | Attending: Internal Medicine | Admitting: Internal Medicine

## 2023-11-25 DIAGNOSIS — I10 Essential (primary) hypertension: Secondary | ICD-10-CM | POA: Diagnosis present

## 2023-11-25 DIAGNOSIS — F03A3 Unspecified dementia, mild, with mood disturbance: Secondary | ICD-10-CM | POA: Diagnosis not present

## 2023-11-25 DIAGNOSIS — I739 Peripheral vascular disease, unspecified: Secondary | ICD-10-CM | POA: Diagnosis not present

## 2023-11-25 DIAGNOSIS — N179 Acute kidney failure, unspecified: Principal | ICD-10-CM | POA: Diagnosis present

## 2023-11-25 DIAGNOSIS — Z7902 Long term (current) use of antithrombotics/antiplatelets: Secondary | ICD-10-CM

## 2023-11-25 DIAGNOSIS — Z7982 Long term (current) use of aspirin: Secondary | ICD-10-CM | POA: Diagnosis not present

## 2023-11-25 DIAGNOSIS — I959 Hypotension, unspecified: Secondary | ICD-10-CM | POA: Diagnosis not present

## 2023-11-25 DIAGNOSIS — D631 Anemia in chronic kidney disease: Secondary | ICD-10-CM | POA: Diagnosis present

## 2023-11-25 DIAGNOSIS — F05 Delirium due to known physiological condition: Secondary | ICD-10-CM | POA: Diagnosis not present

## 2023-11-25 DIAGNOSIS — N400 Enlarged prostate without lower urinary tract symptoms: Secondary | ICD-10-CM | POA: Diagnosis not present

## 2023-11-25 DIAGNOSIS — Z832 Family history of diseases of the blood and blood-forming organs and certain disorders involving the immune mechanism: Secondary | ICD-10-CM

## 2023-11-25 DIAGNOSIS — Z8546 Personal history of malignant neoplasm of prostate: Secondary | ICD-10-CM | POA: Diagnosis not present

## 2023-11-25 DIAGNOSIS — Z743 Need for continuous supervision: Secondary | ICD-10-CM | POA: Diagnosis not present

## 2023-11-25 DIAGNOSIS — Z79899 Other long term (current) drug therapy: Secondary | ICD-10-CM | POA: Diagnosis not present

## 2023-11-25 DIAGNOSIS — E538 Deficiency of other specified B group vitamins: Secondary | ICD-10-CM | POA: Diagnosis present

## 2023-11-25 DIAGNOSIS — I129 Hypertensive chronic kidney disease with stage 1 through stage 4 chronic kidney disease, or unspecified chronic kidney disease: Secondary | ICD-10-CM | POA: Diagnosis not present

## 2023-11-25 DIAGNOSIS — N1832 Chronic kidney disease, stage 3b: Secondary | ICD-10-CM | POA: Diagnosis not present

## 2023-11-25 DIAGNOSIS — Z8249 Family history of ischemic heart disease and other diseases of the circulatory system: Secondary | ICD-10-CM | POA: Diagnosis not present

## 2023-11-25 DIAGNOSIS — R531 Weakness: Secondary | ICD-10-CM

## 2023-11-25 DIAGNOSIS — E7800 Pure hypercholesterolemia, unspecified: Secondary | ICD-10-CM | POA: Diagnosis present

## 2023-11-25 DIAGNOSIS — Z882 Allergy status to sulfonamides status: Secondary | ICD-10-CM

## 2023-11-25 DIAGNOSIS — N390 Urinary tract infection, site not specified: Secondary | ICD-10-CM | POA: Diagnosis not present

## 2023-11-25 DIAGNOSIS — D649 Anemia, unspecified: Secondary | ICD-10-CM | POA: Diagnosis not present

## 2023-11-25 DIAGNOSIS — R899 Unspecified abnormal finding in specimens from other organs, systems and tissues: Secondary | ICD-10-CM | POA: Diagnosis not present

## 2023-11-25 DIAGNOSIS — Z7401 Bed confinement status: Secondary | ICD-10-CM | POA: Diagnosis not present

## 2023-11-25 DIAGNOSIS — N189 Chronic kidney disease, unspecified: Secondary | ICD-10-CM | POA: Diagnosis not present

## 2023-11-25 DIAGNOSIS — E875 Hyperkalemia: Secondary | ICD-10-CM | POA: Diagnosis present

## 2023-11-25 DIAGNOSIS — F32A Depression, unspecified: Secondary | ICD-10-CM | POA: Insufficient documentation

## 2023-11-25 DIAGNOSIS — Z87891 Personal history of nicotine dependence: Secondary | ICD-10-CM

## 2023-11-25 LAB — CBC
HCT: 22 % — ABNORMAL LOW (ref 39.0–52.0)
Hemoglobin: 6.8 g/dL — ABNORMAL LOW (ref 13.0–17.0)
MCH: 28.3 pg (ref 26.0–34.0)
MCHC: 30.9 g/dL (ref 30.0–36.0)
MCV: 91.7 fL (ref 80.0–100.0)
Platelets: 291 10*3/uL (ref 150–400)
RBC: 2.4 MIL/uL — ABNORMAL LOW (ref 4.22–5.81)
RDW: 18.2 % — ABNORMAL HIGH (ref 11.5–15.5)
WBC: 8.2 10*3/uL (ref 4.0–10.5)
nRBC: 0 % (ref 0.0–0.2)

## 2023-11-25 LAB — FOLATE: Folate: 8 ng/mL (ref >5.9)

## 2023-11-25 LAB — BASIC METABOLIC PANEL
Anion gap: 6 (ref 5–15)
BUN: 53 mg/dL — ABNORMAL HIGH (ref 8–23)
CO2: 19 mmol/L — ABNORMAL LOW (ref 22–32)
Calcium: 8.9 mg/dL (ref 8.9–10.3)
Chloride: 107 mmol/L (ref 98–111)
Creatinine, Ser: 1.71 mg/dL — ABNORMAL HIGH (ref 0.61–1.24)
GFR, Estimated: 37 mL/min — ABNORMAL LOW (ref >60)
Glucose, Bld: 111 mg/dL — ABNORMAL HIGH (ref 70–99)
Potassium: 5.6 mmol/L — ABNORMAL HIGH (ref 3.5–5.1)
Sodium: 132 mmol/L — ABNORMAL LOW (ref 135–145)

## 2023-11-25 LAB — IRON AND TIBC
Iron: 72 ug/dL (ref 45–182)
Saturation Ratios: 20 % (ref 17.9–39.5)
TIBC: 360 ug/dL (ref 250–450)
UIBC: 288 ug/dL

## 2023-11-25 LAB — FERRITIN: Ferritin: 16 ng/mL — ABNORMAL LOW (ref 24–336)

## 2023-11-25 LAB — RETICULOCYTES
Immature Retic Fract: 24.3 % — ABNORMAL HIGH (ref 2.3–15.9)
RBC.: 2.38 MIL/uL — ABNORMAL LOW (ref 4.22–5.81)
Retic Count, Absolute: 70.7 10*3/uL (ref 19.0–186.0)
Retic Ct Pct: 3 % (ref 0.4–3.1)

## 2023-11-25 LAB — PREPARE RBC (CROSSMATCH)

## 2023-11-25 LAB — MAGNESIUM: Magnesium: 2.4 mg/dL (ref 1.7–2.4)

## 2023-11-25 LAB — LACTIC ACID, PLASMA
Lactic Acid, Venous: 2.2 mmol/L (ref 0.5–1.9)
Lactic Acid, Venous: 1.5 mmol/L (ref 0.5–1.9)

## 2023-11-25 LAB — VITAMIN B12: Vitamin B-12: 270 pg/mL (ref 180–914)

## 2023-11-25 MED ORDER — LISINOPRIL 20 MG PO TABS
40.0000 mg | ORAL_TABLET | Freq: Every day | ORAL | Status: DC
  Administered 2023-11-26 – 2023-11-29 (×4): 40 mg via ORAL
  Filled 2023-11-25 (×5): qty 2

## 2023-11-25 MED ORDER — SERTRALINE HCL 50 MG PO TABS
50.0000 mg | ORAL_TABLET | Freq: Every day | ORAL | Status: DC
  Administered 2023-11-26 – 2023-11-29 (×4): 50 mg via ORAL
  Filled 2023-11-25 (×5): qty 1

## 2023-11-25 MED ORDER — TAMSULOSIN HCL 0.4 MG PO CAPS
0.4000 mg | ORAL_CAPSULE | Freq: Every day | ORAL | Status: DC
  Administered 2023-11-25 – 2023-11-28 (×4): 0.4 mg via ORAL
  Filled 2023-11-25 (×4): qty 1

## 2023-11-25 MED ORDER — SODIUM CHLORIDE 0.9 % IV SOLN: 10.0000 mL/h | Freq: Once | INTRAVENOUS | Status: DC

## 2023-11-25 MED ORDER — ONDANSETRON HCL 4 MG/2ML IJ SOLN: 4.0000 mg | Freq: Four times a day (QID) | INTRAMUSCULAR | Status: DC | PRN

## 2023-11-25 MED ORDER — SENNOSIDES-DOCUSATE SODIUM 8.6-50 MG PO TABS: 1.0000 | ORAL_TABLET | Freq: Every evening | ORAL | Status: DC | PRN

## 2023-11-25 MED ORDER — ACETAMINOPHEN 325 MG PO TABS: 650.0000 mg | ORAL_TABLET | Freq: Four times a day (QID) | ORAL | Status: DC | PRN

## 2023-11-25 MED ORDER — ATORVASTATIN CALCIUM 20 MG PO TABS
40.0000 mg | ORAL_TABLET | Freq: Every day | ORAL | Status: DC
  Administered 2023-11-25 – 2023-11-28 (×3): 40 mg via ORAL
  Filled 2023-11-25 (×4): qty 2

## 2023-11-25 MED ORDER — ALUM & MAG HYDROXIDE-SIMETH 200-200-20 MG/5ML PO SUSP: 15.0000 mL | Freq: Four times a day (QID) | ORAL | Status: DC | PRN

## 2023-11-25 MED ORDER — MAGNESIUM HYDROXIDE 400 MG/5ML PO SUSP: 30.0000 mL | Freq: Every day | ORAL | Status: DC | PRN

## 2023-11-25 MED ORDER — ACETAMINOPHEN 650 MG RE SUPP: 650.0000 mg | Freq: Four times a day (QID) | RECTAL | Status: DC | PRN

## 2023-11-25 MED ORDER — METOPROLOL TARTRATE 25 MG PO TABS
25.0000 mg | ORAL_TABLET | Freq: Two times a day (BID) | ORAL | Status: DC
  Administered 2023-11-25 – 2023-11-29 (×6): 25 mg via ORAL
  Filled 2023-11-25 (×9): qty 1

## 2023-11-25 MED ORDER — FINASTERIDE 5 MG PO TABS
5.0000 mg | ORAL_TABLET | Freq: Every day | ORAL | Status: DC
Start: 2023-11-26 — End: 2023-11-29
  Administered 2023-11-26 – 2023-11-29 (×4): 5 mg via ORAL
  Filled 2023-11-25 (×5): qty 1

## 2023-11-25 MED ORDER — ONDANSETRON HCL 4 MG PO TABS: 4.0000 mg | ORAL_TABLET | Freq: Four times a day (QID) | ORAL | Status: DC | PRN

## 2023-11-25 MED ORDER — HYDRALAZINE HCL 20 MG/ML IJ SOLN: 5.0000 mg | Freq: Four times a day (QID) | INTRAMUSCULAR | Status: DC | PRN

## 2023-11-25 MED ORDER — FERROUS SULFATE 325 (65 FE) MG PO TABS
325.0000 mg | ORAL_TABLET | Freq: Two times a day (BID) | ORAL | Status: DC
  Administered 2023-11-25 – 2023-11-29 (×6): 325 mg via ORAL
  Filled 2023-11-25 (×9): qty 1

## 2023-11-25 NOTE — H&P (Deleted)
PROGRESS NOTE    Jon Smith  MWU:[id number] DOB: 11/16/1928 DOA: 11/25/2023 PCP: [name], NP    Assessment & Plan:   Principal Problem:   Acute on chronic anemia Active Problems:   Chronic anemia   Essential hypertension   BPH (benign prostatic hyperplasia)   Hypercholesteremia   Hyperkalemia   Generalized weakness   CKD stage 3b, GFR 30-44 ml/min (HCC)   Depression  Assessment and Plan: Acute on chronic anemia: s/p 1 unit of pRBC transfused. Repeat H&H are trending up. Pt denies any bleeding from anywhere. Holding aspirin, plavix.    Chronic anemia: continue on home iron supplements.   B12 deficiency: IM B12 x1. Will start po B12 supplement tomorrow.   HTN: continue on home dose of metoprolol, lisinopril    BPH: continue on home dose of tamsulosin, finasteride    Depression: severity unknown. Continue on home dose of sertraline    CKDIIIb: Cr is trending up from day prior. Avoid nephrotoxic meds    Generalized weakness: PT/OT consulted    Hyperkalemia: resolved    HLD: continue on statin       DVT prophylaxis: SCDs Code Status: full  Family Communication: called pt's son, [name], no answer so I left a voicemail. Disposition Plan: depends on PT/OT recs  Level of care: Telemetry Medical Status is: Inpatient Remains inpatient appropriate because: severity of illness    Consultants:    Procedures:   Antimicrobials:    Subjective: Pt c/o fatigue  Objective: Vitals:   11/25/23 1405 11/25/23 1631 11/25/23 2022 11/26/23 0417  BP: 139/85 (!) 143/83 130/68 (!) 149/66  Pulse: 79 70 76 66  Resp: 20 18 18 18   Temp: 97.8 F (36.6 C) 98.5 F (36.9 C) 98.4 F (36.9 C) 98.6 F (37 C)  TempSrc: Axillary Oral Oral Oral  SpO2: 98% 100% 100% 100%   No intake or output data in the 24 hours ending 11/26/23 1225 There were no vitals filed for this visit.  Examination:  General exam: Appears calm and comfortable  Respiratory system:  Clear to auscultation. Respiratory effort normal. Cardiovascular system: S1 & S2+. No  rubs, gallops or clicks. Gastrointestinal system: Abdomen is nondistended, soft and nontender. Normal bowel sounds heard. Central nervous system: Alert and oriented person, month & year Psychiatry: Judgement and insight appears poor. Flat mood and affect    Data Reviewed: I have personally reviewed following labs and imaging studies  CBC: Recent Labs  Lab 11/25/23 1136 11/26/23 0602  WBC 8.2 8.7  HGB 6.8* 7.6*  HCT 22.0* 22.6*  MCV 91.7 87.3  PLT 291 269   Basic Metabolic Panel: Recent Labs  Lab 11/25/23 1136 11/26/23 0602  NA 132* 129*  K 5.6* 4.8  CL 107 105  CO2 19* 19*  GLUCOSE 111* 103*  BUN 53* 46*  CREATININE 1.71* 1.73*  CALCIUM 8.9 8.5*  MG 2.4  --    GFR: CrCl cannot be calculated (Unknown ideal weight.). Liver Function Tests: No results for input(s): "AST", "ALT", "ALKPHOS", "BILITOT", "PROT", "ALBUMIN" in the last 168 hours. No results for input(s): "LIPASE", "AMYLASE" in the last 168 hours. No results for input(s): "AMMONIA" in the last 168 hours. Coagulation Profile: No results for input(s): "INR", "PROTIME" in the last 168 hours. Cardiac Enzymes: No results for input(s): "CKTOTAL", "CKMB", "CKMBINDEX", "TROPONINI" in the last 168 hours. BNP (last 3 results) No results for input(s): "PROBNP" in the last 8760 hours. HbA1C: No results for input(s): "HGBA1C" in the last 72 hours. CBG:  No results for input(s): "GLUCAP" in the last 168 hours. Lipid Profile: No results for input(s): "CHOL", "HDL", "LDLCALC", "TRIG", "CHOLHDL", "LDLDIRECT" in the last 72 hours. Thyroid Function Tests: No results for input(s): "TSH", "T4TOTAL", "FREET4", "T3FREE", "THYROIDAB" in the last 72 hours. Anemia Panel: Recent Labs    11/25/23 1136  VITAMINB12 270  FOLATE 8.0  FERRITIN 16*  TIBC 360  IRON 72  RETICCTPCT 3.0   Sepsis Labs: Recent Labs  Lab 11/25/23 1136  11/25/23 1411  LATICACIDVEN 2.2* 1.5    No results found for this or any previous visit (from the past 240 hour(s)).       Radiology Studies: No results found.      Scheduled Meds:  atorvastatin  40 mg Oral QHS   ferrous sulfate  325 mg Oral BID   finasteride  5 mg Oral Daily   lisinopril  40 mg Oral Daily   metoprolol tartrate  25 mg Oral BID   sertraline  50 mg Oral Daily   tamsulosin  0.4 mg Oral QPC supper   Continuous Infusions:  sodium chloride Stopped (11/25/23 1418)     LOS: 1 day     Charise Killian, MD [facility] Pager [phone]  If 7PM-7AM, please contact night-coverage [email or web address] 11/26/2023, 12:25 PM

## 2023-11-25 NOTE — Assessment & Plan Note (Signed)
Home sertraline 50 mg daily resumed

## 2023-11-25 NOTE — Hospital Course (Signed)
Mr. Jon Smith is a 89 year old male with history of CKD stage IIIb, hypertension, hyperlipidemia, history of prostate cancer, mild dementia, chronic anemia, who presents emergency department from the [facility] for chief concerns of abnormal labs.  Vitals in the ED showed temperature of 98, respiration rate of 18, heart rate of 70, blood pressure 140/54, SpO2 100% on room air.  Serum sodium is 132, potassium 5.6, chloride 107, bicarb 19, BUN 53, serum creatinine 1.71, EGFR 37, nonfasting blood glucose 111, WBC 8.2, hemoglobin 6.8, platelets of 291.  Lactic acid was 2.2.  ED treatment: 1 unit PRBC has been ordered for transfusion.

## 2023-11-25 NOTE — Assessment & Plan Note (Addendum)
Metoprolol tartrate 25 mg p.o. twice daily resumed and lisinopril 40 mg daily resumed on admission Hydralazine 5 mg IV every 6 hours as needed for SBP > 170, 5 days ordered

## 2023-11-25 NOTE — Assessment & Plan Note (Addendum)
Tamsulosin 0.4 mg daily after supper and finasteride 5 mg daily were resumed on admission

## 2023-11-25 NOTE — Assessment & Plan Note (Signed)
Fall precaution, PT, OT were consulted on admission

## 2023-11-25 NOTE — Assessment & Plan Note (Signed)
At baseline

## 2023-11-25 NOTE — Assessment & Plan Note (Addendum)
Anemia panel has been ordered, still pending vitamin B12 level Iron and TIBC panel is within normal limits; ferritin level is low at 16; RBC is low at 2.38, immature reticulocyte fraction is 24.3 Continue with 1 unit of PRBC for transfusion Will recheck hemoglobin hematocrit posttransfusion We will hold aspirin and Plavix on admission Nursing order/instruction to recheck hemoglobin/hematocrit and serum potassium level post 1 unit of blood transfusion completion placed Admit to telemetry medical, inpatient

## 2023-11-25 NOTE — ED Triage Notes (Addendum)
Pt comes via EMs from [facility] [place] with c/o abnormal labs. EMS unsure what labs. VSS  Paperwork with pt shows  Na 131 K+ 5.7 BUN 46 Creatinine 2.00 BUN.creat ratio 23.0 AST-15 Albumin 3.1

## 2023-11-25 NOTE — Assessment & Plan Note (Signed)
-   Atorvastatin 40 mg nightly resumed 

## 2023-11-25 NOTE — Assessment & Plan Note (Addendum)
Baseline hemoglobin over the last year has been 7.9-9.7 Home iron supplementations, ferrous sulfate 325 mg p.o. twice daily resumed on admission

## 2023-11-25 NOTE — ED Provider Notes (Signed)
[facility] Provider Note    Event Date/Time   First MD Initiated Contact with Patient 11/25/23 1120     (approximate)   History   abnormal labs   HPI  Jon Smith is a 89 y.o. male with past medical history of CKD, hypertension, hyperlipidemia, prostate cancer, nephrolithiasis, here with abnormal labs.  Patient states that he was sent here because he had abnormal lab work at his facility.  He denies any major complaints.  Specifically, denies any headache, chest pain, shortness of breath.  Denies any nausea or vomiting.  Denies any abdominal pain.  Remainder of history limited due to mild dementia.     Physical Exam   Triage Vital Signs: ED Triage Vitals  Encounter Vitals Group     BP 11/25/23 1127 (!) 140/54     Systolic BP Percentile --      Diastolic BP Percentile --      Pulse Rate 11/25/23 1127 70     Resp 11/25/23 1127 18     Temp 11/25/23 1127 98 F (36.7 C)     Temp Source 11/25/23 1127 Oral     SpO2 11/25/23 1127 100 %     Weight --      Height --      Head Circumference --      Peak Flow --      Pain Score 11/25/23 1054 0     Pain Loc --      Pain Education --      Exclude from Growth Chart --     Most recent vital signs: Vitals:   11/25/23 1631 11/25/23 2022  BP: (!) 143/83 130/68  Pulse: 70 76  Resp: 18 18  Temp: 98.5 F (36.9 C) 98.4 F (36.9 C)  SpO2: 100% 100%     General: Awake, no distress.  CV:  Good peripheral perfusion.  Regular rate and rhythm.  No murmurs. Resp:  Normal work of breathing.  Lungs with auscultation bilaterally. Abd:  No distention.  No tenderness.  No guarding or rebound. Other:  Alert, oriented to person and place but not time, no cranial nerve deficits.  Strength out of 5 bilateral upper and lower extremities.  Normal sensation light touch.  Mildly dry mucous membranes.   ED Results / Procedures / Treatments   Labs (all labs ordered are listed, but only abnormal results are  displayed) Labs Reviewed  CBC - Abnormal; Notable for the following components:      Result Value   RBC 2.40 (*)    Hemoglobin 6.8 (*)    HCT 22.0 (*)    RDW 18.2 (*)    All other components within normal limits  BASIC METABOLIC PANEL - Abnormal; Notable for the following components:   Sodium 132 (*)    Potassium 5.6 (*)    CO2 19 (*)    Glucose, Bld 111 (*)    BUN 53 (*)    Creatinine, Ser 1.71 (*)    GFR, Estimated 37 (*)    All other components within normal limits  FERRITIN - Abnormal; Notable for the following components:   Ferritin 16 (*)    All other components within normal limits  RETICULOCYTES - Abnormal; Notable for the following components:   RBC. 2.38 (*)    Immature Retic Fract 24.3 (*)    All other components within normal limits  LACTIC ACID, PLASMA - Abnormal; Notable for the following components:   Lactic Acid, Venous 2.2 (*)  All other components within normal limits  MAGNESIUM  VITAMIN B12  FOLATE  IRON AND TIBC  LACTIC ACID, PLASMA  URINALYSIS, ROUTINE W REFLEX MICROSCOPIC  BASIC METABOLIC PANEL  CBC  TYPE AND SCREEN  PREPARE RBC (CROSSMATCH)     EKG Normal sinus rhythm, Triklo rate 68.  PR 178, QRS 99, QTc 432.  No acute ST elevations repress particular joints of acute ischemia or infarct.   RADIOLOGY    I also independently reviewed and agree with radiologist interpretations.   PROCEDURES:  Critical Care performed: Yes, see critical care procedure note(s)  .Critical Care  Performed by: Shaune Pollack, MD Authorized by: Shaune Pollack, MD   Critical care provider statement:    Critical care time (minutes):  30   Critical care time was exclusive of:  Separately billable procedures and treating other patients   Critical care was necessary to treat or prevent imminent or life-threatening deterioration of the following conditions:  Cardiac failure, circulatory failure and respiratory failure   Critical care was time spent  personally by me on the following activities:  Development of treatment plan with patient or surrogate, discussions with consultants, evaluation of patient's response to treatment, examination of patient, ordering and review of laboratory studies, ordering and review of radiographic studies, ordering and performing treatments and interventions, pulse oximetry, re-evaluation of patient's condition and review of old charts     MEDICATIONS ORDERED IN ED: Medications  0.9 %  sodium chloride infusion (0 mL/hr Intravenous Hold 11/25/23 1418)  tamsulosin (FLOMAX) capsule 0.4 mg (0.4 mg Oral Given 11/25/23 1812)  acetaminophen (TYLENOL) tablet 650 mg (has no administration in time range)    Or  acetaminophen (TYLENOL) suppository 650 mg (has no administration in time range)  ondansetron (ZOFRAN) tablet 4 mg (has no administration in time range)    Or  ondansetron (ZOFRAN) injection 4 mg (has no administration in time range)  hydrALAZINE (APRESOLINE) injection 5 mg (has no administration in time range)  atorvastatin (LIPITOR) tablet 40 mg (40 mg Oral Given 11/25/23 2101)  metoprolol tartrate (LOPRESSOR) tablet 25 mg (25 mg Oral Given 11/25/23 2100)  lisinopril (ZESTRIL) tablet 40 mg (has no administration in time range)  sertraline (ZOLOFT) tablet 50 mg (has no administration in time range)  alum & mag hydroxide-simeth (MAALOX/MYLANTA) 200-200-20 MG/5ML suspension 15 mL (has no administration in time range)  magnesium hydroxide (MILK OF MAGNESIA) suspension 30 mL (has no administration in time range)  finasteride (PROSCAR) tablet 5 mg (has no administration in time range)  ferrous sulfate tablet 325 mg (325 mg Oral Given 11/25/23 2101)     IMPRESSION / MDM / ASSESSMENT AND PLAN / ED COURSE  I reviewed the triage vital signs and the nursing notes.                              Differential diagnosis includes, but is not limited to, dehydration, anemia, CHF  Patient's presentation is most  consistent with acute presentation with potential threat to life or bodily function.  The patient is on the cardiac monitor to evaluate for evidence of arrhythmia and/or significant heart rate changes  89 yo M with PMHx HTN, CKD, HLD, prostate CA, here with abnormal labs. Labs show acute on chronic anemia, worsening renal function. Pt is otherwise asymptomatic. CBC shows Hgb 6.8, below baseline of 7.5-8.5. BMP concerning for worsening renal failure with K 5.6., Cr 1.71.  Will admit to medicine.  FINAL CLINICAL IMPRESSION(S) / ED DIAGNOSES   Final diagnoses:  AKI (acute kidney injury) (HCC)  Acute on chronic anemia     Rx / DC Orders   ED Discharge Orders     None        Note:  This document was prepared using Dragon voice recognition software and may include unintentional dictation errors.   Shaune Pollack, MD 11/25/23 2117

## 2023-11-25 NOTE — Assessment & Plan Note (Signed)
Holding home lisinopril on admission Recheck serum potassium post blood transfusion Recheck BMP in the a.m.

## 2023-11-26 DIAGNOSIS — D649 Anemia, unspecified: Secondary | ICD-10-CM | POA: Diagnosis not present

## 2023-11-26 LAB — CBC
HCT: 22.6 % — ABNORMAL LOW (ref 39.0–52.0)
Hemoglobin: 7.6 g/dL — ABNORMAL LOW (ref 13.0–17.0)
MCH: 29.3 pg (ref 26.0–34.0)
MCHC: 33.6 g/dL (ref 30.0–36.0)
MCV: 87.3 fL (ref 80.0–100.0)
Platelets: 269 10*3/uL (ref 150–400)
RBC: 2.59 MIL/uL — ABNORMAL LOW (ref 4.22–5.81)
RDW: 17.4 % — ABNORMAL HIGH (ref 11.5–15.5)
WBC: 8.7 10*3/uL (ref 4.0–10.5)
nRBC: 0 % (ref 0.0–0.2)

## 2023-11-26 LAB — TYPE AND SCREEN
ABO/RH(D): A POS
Antibody Screen: NEGATIVE
Unit division: 0

## 2023-11-26 LAB — BASIC METABOLIC PANEL
Anion gap: 5 (ref 5–15)
BUN: 46 mg/dL — ABNORMAL HIGH (ref 8–23)
CO2: 19 mmol/L — ABNORMAL LOW (ref 22–32)
Calcium: 8.5 mg/dL — ABNORMAL LOW (ref 8.9–10.3)
Chloride: 105 mmol/L (ref 98–111)
Creatinine, Ser: 1.73 mg/dL — ABNORMAL HIGH (ref 0.61–1.24)
GFR, Estimated: 36 mL/min — ABNORMAL LOW (ref >60)
Glucose, Bld: 103 mg/dL — ABNORMAL HIGH (ref 70–99)
Potassium: 4.8 mmol/L (ref 3.5–5.1)
Sodium: 129 mmol/L — ABNORMAL LOW (ref 135–145)

## 2023-11-26 LAB — BPAM RBC
Blood Product Expiration Date: 202412302359
ISSUE DATE / TIME: 202411261400
Unit Type and Rh: 6200

## 2023-11-26 MED ORDER — CYANOCOBALAMIN 1000 MCG/ML IJ SOLN
1000.0000 ug | Freq: Once | INTRAMUSCULAR | Status: AC
Start: 2023-11-26 — End: 2023-11-26
  Administered 2023-11-26: 1000 ug via INTRAMUSCULAR
  Filled 2023-11-26: qty 1

## 2023-11-26 MED ORDER — ADULT MULTIVITAMIN W/MINERALS CH
1.0000 | ORAL_TABLET | Freq: Every day | ORAL | Status: DC
  Administered 2023-11-27 – 2023-11-29 (×2): 1 via ORAL
  Filled 2023-11-26 (×3): qty 1

## 2023-11-26 MED ORDER — OLANZAPINE 5 MG PO TBDP
5.0000 mg | ORAL_TABLET | Freq: Once | ORAL | Status: AC
  Administered 2023-11-26: 5 mg via ORAL
  Filled 2023-11-26: qty 1

## 2023-11-26 MED ORDER — VITAMIN B-12 1000 MCG PO TABS
1000.0000 ug | ORAL_TABLET | Freq: Every day | ORAL | Status: DC
  Administered 2023-11-27 – 2023-11-29 (×3): 1000 ug via ORAL
  Filled 2023-11-26 (×4): qty 1

## 2023-11-26 MED ORDER — ENSURE ENLIVE PO LIQD
237.0000 mL | Freq: Three times a day (TID) | ORAL | Status: DC
  Administered 2023-11-27 – 2023-11-29 (×6): 237 mL via ORAL

## 2023-11-26 NOTE — Evaluation (Signed)
Occupational Therapy Evaluation Patient Details Name: Jon Smith MRN: [id number] DOB: [birth year] Today's Date: 11/26/2023   History of Present Illness Pt is 89 y/o admitted 11/25/23 for acute on chronic anemia. Per reports, pt is WC bound at baseline and performs pivot transfers typically without assistance. PmHx includes: CKD, HTN, HLD, PrCA, and anemia.   Clinical Impression   Pt was seen for OT evaluation this date. Prior to hospital admission, pt reports Ind with ADL's but unclear d/t poor historian/family not available. Pt lives at ALF. Pt presents to acute OT demonstrating impaired ADL performance and functional mobility 2/2 (See OT problem list for additional functional deficits). Upon arrival to room pt supine in bed, agreeable to tx. Pt slightly confused and required vc during session. Pt completed sup>sit t/f with Min A +use of rails. Pt completed grooming tasks (oral care and wasing/drying face) seated at the EOB with supervision. Pt performed lateral scoots towards the [facility] with supervision. Pt returned to supine with CGA. Pt left supine in bed with call bell within reach and all needs met. Pt would benefit from skilled OT services to address noted impairments and functional limitations (see below for any additional details) in order to maximize safety and independence while minimizing falls risk and caregiver burden. Anticipate the need for follow up OT services upon acute hospital DC.        If plan is discharge home, recommend the following: A little help with walking and/or transfers;A little help with bathing/dressing/bathroom;Assist for transportation;Assistance with cooking/housework;Direct supervision/assist for medications management;Supervision due to cognitive status    Functional Status Assessment  Patient has had a recent decline in their functional status and demonstrates the ability to make significant improvements in function in a reasonable and predictable amount of  time.  Equipment Recommendations  None recommended by OT    Recommendations for Other Services       Precautions / Restrictions Precautions Precautions: Fall Restrictions Weight Bearing Restrictions: No      Mobility Bed Mobility Overal bed mobility: Needs Assistance Bed Mobility: Supine to Sit, Sit to Supine     Supine to sit: Min assist Sit to supine: Contact guard assist   General bed mobility comments: While seated EOB, pt performed lateral scoots SUP for apporpriate bed repositioning prior to getting back into bed. Patient Response: Cooperative  Transfers                          Balance Overall balance assessment: Needs assistance Sitting-balance support: Feet supported, Bilateral upper extremity supported Sitting balance-Leahy Scale: Fair                                     ADL either performed or assessed with clinical judgement   ADL Overall ADL's : Needs assistance/impaired                                     Functional mobility during ADLs: Supervision/safety General ADL Comments: Pt completed grooming tasks (oral care and wasing/drying face) seated at the EOB with supervision.     Vision         Perception         Praxis         Pertinent Vitals/Pain Pain Assessment Pain Assessment: No/denies pain     Extremity/Trunk Assessment Upper  Extremity Assessment Upper Extremity Assessment: Overall WFL for tasks assessed   Lower Extremity Assessment Lower Extremity Assessment: Generalized weakness       Communication Communication Communication: Difficulty following commands/understanding;Difficulty communicating thoughts/reduced clarity of speech Following commands: Follows one step commands consistently;Follows multi-step commands inconsistently Cueing Techniques: Verbal cues   Cognition Arousal: Alert Behavior During Therapy: WFL for tasks assessed/performed Overall Cognitive Status: History of  cognitive impairments - at baseline                                 General Comments: Pt pleasant and willing to participate in OT session. Pt needed continuous redirecting during session in order to complete tasks appropriately and safely.     General Comments       Exercises     Shoulder Instructions      Home Living Family/patient expects to be discharged to:: Assisted living                             Home Equipment: Wheelchair - manual   Additional Comments: Performs pivot transfer bed to/from [facility] with supervision at baseline. Propels with arms mostly      Prior Functioning/Environment Prior Level of Function : Needs assist  Cognitive Assist : ADLs (cognitive)   ADLs (Cognitive): Intermittent cues         ADLs Comments: Pt reports to do bathing, toileting and dressing IND; Pt reports performing lateral scoots onto the toilet and does dressing from the bed or at EOB.        OT Problem List: Decreased strength;Decreased range of motion;Decreased activity tolerance;Decreased safety awareness;Impaired balance (sitting and/or standing);Decreased coordination      OT Treatment/Interventions: Self-care/ADL training;Therapeutic exercise;Energy conservation;Therapeutic activities    OT Goals(Current goals can be found in the care plan section) Acute Rehab OT Goals Patient Stated Goal: to go home OT Goal Formulation: With patient Time For Goal Achievement: 12/10/23 Potential to Achieve Goals: Good  OT Frequency: Min 1X/week    Co-evaluation              AM-PAC OT "6 Clicks" Daily Activity     Outcome Measure Help from another person eating meals?: None Help from another person taking care of personal grooming?: A Little Help from another person toileting, which includes using toliet, bedpan, or urinal?: A Lot Help from another person bathing (including washing, rinsing, drying)?: A Lot Help from another person to put on and taking off  regular upper body clothing?: None Help from another person to put on and taking off regular lower body clothing?: A Little 6 Click Score: 18   End of Session Nurse Communication: Mobility status  Activity Tolerance: Patient tolerated treatment well Patient left: in bed  OT Visit Diagnosis: Muscle weakness (generalized) (M62.81)                Time: 1610-9604 OT Time Calculation (min): 15 min Charges:     Butch Penny, SOT

## 2023-11-26 NOTE — Evaluation (Signed)
Physical Therapy Evaluation Patient Details Name: Jon Smith MRN: [id number] DOB: [birth year] Today's Date: 11/26/2023  History of Present Illness  Pt is 89 y/o admitted 11/25/23 for acute on chronic anemia. Per reports, pt is WC bound at baseline and performs pivot transfers typically without assistance. PmHx includes: CKD, HTN, HLD, PrCA, and anemia.   Clinical Impression  Pt received in bed with son at bedside and agreed to PT session. Pt reported that he does not have any pain and is ready to leave the hospital. Pt performed bed mobility SUP with HOB elevated/bed rails and completed a lateral scoot transfer to sit in recliner CGA. To return back to bed, pt completed additional lateral scoots while seated EOB SUP with HOB elevated/bed rails. At the end of each transfer and during seated lateral scoots at EOB, pt presented with heavy breathing and fatigue, rest breaks per transfer were necessary. VC required during session to encourage pursed lip breathing. Pt tolerated Tx fair and will continue to benefit from skilled PT sessions to improve functional mobility and activity tolerance to maximize safety/IND following D/C.        If plan is discharge home, recommend the following: Assist for transportation   Can travel by private vehicle        Equipment Recommendations None recommended by PT  Recommendations for Other Services       Functional Status Assessment Patient has had a recent decline in their functional status and demonstrates the ability to make significant improvements in function in a reasonable and predictable amount of time.     Precautions / Restrictions Precautions Precautions: Fall Restrictions Weight Bearing Restrictions: No      Mobility  Bed Mobility Overal bed mobility: Needs Assistance Bed Mobility: Supine to Sit, Sit to Supine     Supine to sit: Supervision Sit to supine: Supervision   General bed mobility comments: Pt performed bed mobility SUP.  While seated EOB, pt performed lateral scoots SUP for apporpriate bed repositioning prior to getting back into bed.    Transfers Overall transfer level: Needs assistance Equipment used: None Transfers: Bed to chair/wheelchair/BSC            Lateral/Scoot Transfers: Contact guard assist General transfer comment: Pt performed lateral scoot transfer to recliner from the bed CGA. Pt completed transfer from the bed to the recliner and from the recliner to the bed. A rest break was necessary after each transfer due to fatigue.    Ambulation/Gait               General Gait Details: Deferred  Stairs            Wheelchair Mobility     Tilt Bed    Modified Rankin (Stroke Patients Only)       Balance Overall balance assessment: Needs assistance Sitting-balance support: Feet supported, Bilateral upper extremity supported Sitting balance-Leahy Scale: Fair         Standing balance comment: Not observed                             Pertinent Vitals/Pain Pain Assessment Pain Assessment: No/denies pain    Home Living Family/patient expects to be discharged to:: Assisted living                 Home Equipment: Wheelchair - manual Additional Comments: Performs pivot transfer bed to/from [facility] with supervision at baseline. Propels with arms mostly    Prior Function Prior Level  of Function : Needs assist;Patient poor historian/Family not available  Cognitive Assist : ADLs (cognitive)   ADLs (Cognitive): Intermittent cues         ADLs Comments: Pt reports to do bathing, toileting and dressing IND; Pt reports performing lateral scoots onto the toilet and does dressing from the bed or at EOB.     Extremity/Trunk Assessment   Upper Extremity Assessment Upper Extremity Assessment: Overall WFL for tasks assessed    Lower Extremity Assessment Lower Extremity Assessment: Generalized weakness       Communication   Communication Communication:  Difficulty following commands/understanding;Difficulty communicating thoughts/reduced clarity of speech Following commands: Follows one step commands consistently;Follows multi-step commands inconsistently Cueing Techniques: Verbal cues  Cognition Arousal: Alert Behavior During Therapy: Impulsive Overall Cognitive Status: History of cognitive impairments - at baseline                                 General Comments: Pt pleasant and willing to participate in PT session. Pt needed continuous redirecting during session in order to complete tasks appropriately and safely.        General Comments      Exercises     Assessment/Plan    PT Assessment Patient needs continued PT services  PT Problem List Decreased strength;Decreased activity tolerance;Decreased mobility       PT Treatment Interventions Functional mobility training;Therapeutic exercise;Therapeutic activities    PT Goals (Current goals can be found in the Care Plan section)  Acute Rehab PT Goals Patient Stated Goal: To go home PT Goal Formulation: With patient Time For Goal Achievement: 12/10/23 Potential to Achieve Goals: Fair    Frequency Min 1X/week     Co-evaluation               AM-PAC PT "6 Clicks" Mobility  Outcome Measure Help needed turning from your back to your side while in a flat bed without using bedrails?: None Help needed moving from lying on your back to sitting on the side of a flat bed without using bedrails?: A Little Help needed moving to and from a bed to a chair (including a wheelchair)?: A Little Help needed standing up from a chair using your arms (e.g., wheelchair or bedside chair)?: A Lot Help needed to walk in hospital room?: Total Help needed climbing 3-5 steps with a railing? : Total 6 Click Score: 14    End of Session   Activity Tolerance: Patient tolerated treatment well Patient left: in bed;with call bell/phone within reach;with bed alarm set;with  family/visitor present Nurse Communication: Mobility status PT Visit Diagnosis: Unsteadiness on feet (R26.81);Muscle weakness (generalized) (M62.81)    Time: 0935-1000 PT Time Calculation (min) (ACUTE ONLY): 25 min   Charges:   PT Evaluation $PT Eval Low Complexity: 1 Low PT Treatments $Therapeutic Activity: 8-22 mins PT General Charges $$ ACUTE PT VISIT: 1 Visit         Eloni Darius Sauvignon Howard SPT, LAT, ATC  Jaeline Whobrey Sauvignon-Howard 11/26/2023, 1:48 PM

## 2023-11-26 NOTE — TOC Initial Note (Addendum)
Transition of Care [facility]) - Initial/Assessment Note    Patient Details  Name: Jon Smith MRN: [id number] Date of Birth: 18-Oct-1928  [facility] CM/SW Contact:    Margarito Liner, LCSW Phone Number: 11/26/2023, 2:53 PM  Clinical Narrative:  Patient only oriented to self. No family at bedside. CSW spoke to [place] at [employer] who confirmed that patient is a resident in their ALF. CSW notified him that therapy is recommending home health. Their preferred agency is [facility]. CSW left voicemail for son, [name]. Will discuss when he calls back.                4:04 pm: Tried Research officer, political party at home number but no answer. CSW called son Smitty Cords and he stated that [employer]. CSW will await return call from [place].  Expected Discharge Plan: Assisted Living (with home health) Barriers to Discharge: Continued Medical Work up   Patient Goals and CMS Choice            Expected Discharge Plan and Services     Post Acute Care Choice:  (TBD) Living arrangements for the past 2 months: Assisted Living Facility                                      Prior Living Arrangements/Services Living arrangements for the past 2 months: Assisted Living Facility Lives with:: Facility Resident Patient language and need for interpreter reviewed:: Yes        Need for Family Participation in Patient Care: Yes (Comment) Care giver support system in place?: Yes (comment)   Criminal Activity/Legal Involvement Pertinent to Current Situation/Hospitalization: No - Comment as needed  Activities of Daily Living      Permission Sought/Granted Permission sought to share information with : Photographer granted to share info w AGENCY: [facility]        Emotional Assessment   Attitude/Demeanor/Rapport: Unable to Assess Affect (typically observed): Unable to Assess Orientation: : Oriented to Self Alcohol / Substance  Use: Not Applicable Psych Involvement: No (comment)  Admission diagnosis:  Acute on chronic anemia [D64.9] Patient Active Problem List   Diagnosis Date Noted   Acute on chronic anemia 11/25/2023   CKD stage 3b, GFR 30-44 ml/min (HCC) 11/25/2023   Depression 11/25/2023   UTI (urinary tract infection) 04/11/2023   Altered mental status 04/11/2023   Generalized weakness 04/11/2023   BPH (benign prostatic hyperplasia) 04/11/2023   CVA (cerebral vascular accident) (HCC) 04/11/2023   Dysphagia 04/11/2023   Renovascular hypertension 10/13/2020   Disease characterized by destruction of skeletal muscle 07/25/2020   Elevated AST (SGOT) 07/25/2020   Fall 07/25/2020   Hyperkalemia 07/25/2020   Urinary retention 07/25/2020   Vitamin B12 deficiency 08/20/2019   Generalized muscle weakness 08/18/2019   Asymptomatic proteinuria 02/22/2019   Vitamin D deficiency 02/22/2019   Chronic anemia 02/21/2019   High risk medication use 02/21/2019   Scalp hematoma 04/28/2018   Urine test positive for microalbuminuria 08/27/2017   Neck pain 07/01/2017   DDD (degenerative disc disease), cervical 07/01/2017   Prostate cancer (HCC) 01/16/2016   Elevated PSA 01/16/2016   Protein-calorie malnutrition, severe 12/12/2015   Gross hematuria    Acute hepatitis 12/08/2015   Elevated LFTs    Abnormal liver function tests 12/06/2015   H/O malignant neoplasm of prostate 09/18/2015   Hypercholesteremia 05/17/2014  Essential hypertension 05/17/2014   Photodermatitis due to sun 05/17/2014   PCP:  [name], NP Pharmacy:   [facility] [place], [place] - [address] [place] [address] Phone: [phone] Fax: [phone]     Social Determinants of Health (SDOH) Social History: SDOH Screenings   Food Insecurity: Patient Unable To Answer (11/25/2023)  Housing: Patient Unable To Answer (04/11/2023)  Transportation Needs: No Transportation Needs  (11/25/2023)  Utilities: Not At Risk (11/25/2023)  Financial Resource Strain: High Risk (07/25/2020)   Received from [facility]  Tobacco Use: Medium Risk (11/25/2023)   SDOH Interventions:     Readmission Risk Interventions     No data to display

## 2023-11-27 DIAGNOSIS — D649 Anemia, unspecified: Secondary | ICD-10-CM | POA: Diagnosis not present

## 2023-11-27 DIAGNOSIS — N390 Urinary tract infection, site not specified: Secondary | ICD-10-CM | POA: Diagnosis not present

## 2023-11-27 DIAGNOSIS — R899 Unspecified abnormal finding in specimens from other organs, systems and tissues: Secondary | ICD-10-CM | POA: Diagnosis not present

## 2023-11-27 DIAGNOSIS — N189 Chronic kidney disease, unspecified: Secondary | ICD-10-CM | POA: Diagnosis not present

## 2023-11-27 DIAGNOSIS — N179 Acute kidney failure, unspecified: Secondary | ICD-10-CM | POA: Diagnosis not present

## 2023-11-27 LAB — CBC
HCT: 27.3 % — ABNORMAL LOW (ref 39.0–52.0)
Hemoglobin: 8.7 g/dL — ABNORMAL LOW (ref 13.0–17.0)
MCH: 28.8 pg (ref 26.0–34.0)
MCHC: 31.9 g/dL (ref 30.0–36.0)
MCV: 90.4 fL (ref 80.0–100.0)
Platelets: 315 10*3/uL (ref 150–400)
RBC: 3.02 MIL/uL — ABNORMAL LOW (ref 4.22–5.81)
RDW: 17.4 % — ABNORMAL HIGH (ref 11.5–15.5)
WBC: 9.5 10*3/uL (ref 4.0–10.5)
nRBC: 0 % (ref 0.0–0.2)

## 2023-11-27 LAB — BASIC METABOLIC PANEL
Anion gap: 10 (ref 5–15)
BUN: 45 mg/dL — ABNORMAL HIGH (ref 8–23)
CO2: 19 mmol/L — ABNORMAL LOW (ref 22–32)
Calcium: 9 mg/dL (ref 8.9–10.3)
Chloride: 104 mmol/L (ref 98–111)
Creatinine, Ser: 1.58 mg/dL — ABNORMAL HIGH (ref 0.61–1.24)
GFR, Estimated: 41 mL/min — ABNORMAL LOW (ref >60)
Glucose, Bld: 107 mg/dL — ABNORMAL HIGH (ref 70–99)
Potassium: 4.7 mmol/L (ref 3.5–5.1)
Sodium: 133 mmol/L — ABNORMAL LOW (ref 135–145)

## 2023-11-27 LAB — PHOSPHORUS: Phosphorus: 2.7 mg/dL (ref 2.5–4.6)

## 2023-11-27 LAB — MAGNESIUM: Magnesium: 2.4 mg/dL (ref 1.7–2.4)

## 2023-11-27 MED ORDER — HALOPERIDOL 2 MG PO TABS
2.0000 mg | ORAL_TABLET | Freq: Three times a day (TID) | ORAL | Status: DC | PRN
  Administered 2023-11-27: 2 mg via ORAL
  Filled 2023-11-27 (×2): qty 1

## 2023-11-27 MED ORDER — HALOPERIDOL LACTATE 5 MG/ML IJ SOLN
1.0000 mg | Freq: Once | INTRAMUSCULAR | Status: AC
  Administered 2023-11-27: 1 mg via INTRAMUSCULAR
  Filled 2023-11-27: qty 1

## 2023-11-27 MED ORDER — HALOPERIDOL LACTATE 5 MG/ML IJ SOLN: 1.0000 mg | Freq: Once | INTRAMUSCULAR | Status: DC

## 2023-11-27 MED ORDER — ZIPRASIDONE MESYLATE 20 MG IM SOLR
10.0000 mg | Freq: Once | INTRAMUSCULAR | Status: AC
  Administered 2023-11-27: 10 mg via INTRAMUSCULAR
  Filled 2023-11-27: qty 20

## 2023-11-27 MED ORDER — ZIPRASIDONE MESYLATE 20 MG IM SOLR: 10.0000 mg | Freq: Once | INTRAMUSCULAR | Status: DC

## 2023-11-27 MED ORDER — HALOPERIDOL LACTATE 5 MG/ML IJ SOLN: 2.0000 mg | Freq: Three times a day (TID) | INTRAMUSCULAR | Status: DC | PRN

## 2023-11-27 NOTE — Progress Notes (Signed)
PROGRESS NOTE    Jon Smith  ZOX:[id number] DOB: 12/19/1928 DOA: 11/25/2023 PCP: [name], NP    Assessment & Plan:   Principal Problem:   Acute on chronic anemia Active Problems:   Chronic anemia   Essential hypertension   BPH (benign prostatic hyperplasia)   Hypercholesteremia   Hyperkalemia   Generalized weakness   CKD stage 3b, GFR 30-44 ml/min (HCC)   Depression  Assessment and Plan: Acute on chronic anemia: s/p 1 unit of pRBC transfused. H&H are trending up again today. Holding aspirin, plavix   Acute encephalopathy: etiology unclear, delirium vs dementia. Baseline mental status is unknown. Haldol prn.    Chronic anemia: continue on home iron supplements.   B12 deficiency: s/p B12 x 1. Continue po B12 supplements.   HTN: continue on home dose of lisinopril, metoprolol    BPH: continue on home dose of finasteride, tamsulosin    Depression: severity unknown. Continue on home dose of sertraline    CKDIIIb: Cr is trending down from day prior. Avoid nephrotoxic meds   Generalized weakness: PT/OT recs HH    Hyperkalemia: resolved    HLD: continue on statin        DVT prophylaxis: SCDs Code Status: full  Family Communication: called pt's son, [name], again and no answer Disposition Plan: likely d/c home w/ HH   Level of care: Telemetry Medical Status is: Inpatient Remains inpatient appropriate because: severity of illness    Consultants:    Procedures:   Antimicrobials:    Subjective: Pt is pleasantly confused   Objective: Vitals:   11/26/23 2016 11/27/23 0402 11/27/23 0643 11/27/23 0750  BP: 137/61 (!) 148/71  (!) 154/92  Pulse: 79 68  86  Resp: 18 20  20   Temp: 97.6 F (36.4 C) (!) 96.8 F (36 C)  98.2 F (36.8 C)  TempSrc: Oral Axillary    SpO2: 100% 100%  (!) 76%  Weight:   61.1 kg     Intake/Output Summary (Last 24 hours) at 11/27/2023 0830 Last data filed at 11/26/2023 2300 Gross per 24 hour  Intake --   Output 350 ml  Net -350 ml   Filed Weights   11/27/23 0643  Weight: 61.1 kg    Examination:  General exam: Appears restless  Respiratory system: clear breath sounds b/l. Cardiovascular system: S1/S2+. No rubs or clicks  Gastrointestinal system: Abd is soft, NT, ND & hypoactive bowel sounds  Central nervous system: Alert & awake. Moves all extremities  Psychiatry: judgement and insight appears poor.     Data Reviewed: I have personally reviewed following labs and imaging studies  CBC: Recent Labs  Lab 11/25/23 1136 11/26/23 0602 11/27/23 0347  WBC 8.2 8.7 9.5  HGB 6.8* 7.6* 8.7*  HCT 22.0* 22.6* 27.3*  MCV 91.7 87.3 90.4  PLT 291 269 315   Basic Metabolic Panel: Recent Labs  Lab 11/25/23 1136 11/26/23 0602 11/27/23 0347  NA 132* 129* 133*  K 5.6* 4.8 4.7  CL 107 105 104  CO2 19* 19* 19*  GLUCOSE 111* 103* 107*  BUN 53* 46* 45*  CREATININE 1.71* 1.73* 1.58*  CALCIUM 8.9 8.5* 9.0  MG 2.4  --  2.4  PHOS  --   --  2.7   GFR: CrCl cannot be calculated (Unknown ideal weight.). Liver Function Tests: No results for input(s): "AST", "ALT", "ALKPHOS", "BILITOT", "PROT", "ALBUMIN" in the last 168 hours. No results for input(s): "LIPASE", "AMYLASE" in the last 168 hours. No results for input(s): "AMMONIA"  in the last 168 hours. Coagulation Profile: No results for input(s): "INR", "PROTIME" in the last 168 hours. Cardiac Enzymes: No results for input(s): "CKTOTAL", "CKMB", "CKMBINDEX", "TROPONINI" in the last 168 hours. BNP (last 3 results) No results for input(s): "PROBNP" in the last 8760 hours. HbA1C: No results for input(s): "HGBA1C" in the last 72 hours. CBG: No results for input(s): "GLUCAP" in the last 168 hours. Lipid Profile: No results for input(s): "CHOL", "HDL", "LDLCALC", "TRIG", "CHOLHDL", "LDLDIRECT" in the last 72 hours. Thyroid Function Tests: No results for input(s): "TSH", "T4TOTAL", "FREET4", "T3FREE", "THYROIDAB" in the last 72  hours. Anemia Panel: Recent Labs    11/25/23 1136  VITAMINB12 270  FOLATE 8.0  FERRITIN 16*  TIBC 360  IRON 72  RETICCTPCT 3.0   Sepsis Labs: Recent Labs  Lab 11/25/23 1136 11/25/23 1411  LATICACIDVEN 2.2* 1.5    No results found for this or any previous visit (from the past 240 hour(s)).       Radiology Studies: No results found.      Scheduled Meds:  atorvastatin  40 mg Oral QHS   vitamin B-12  1,000 mcg Oral Daily   feeding supplement  237 mL Oral TID BM   ferrous sulfate  325 mg Oral BID   finasteride  5 mg Oral Daily   lisinopril  40 mg Oral Daily   metoprolol tartrate  25 mg Oral BID   multivitamin with minerals  1 tablet Oral Daily   sertraline  50 mg Oral Daily   tamsulosin  0.4 mg Oral QPC supper   Continuous Infusions:  sodium chloride Stopped (11/25/23 1418)     LOS: 2 days     Charise Killian, MD [facility] Pager [phone]  If 7PM-7AM, please contact night-coverage [email or web address] 11/27/2023, 8:30 AM

## 2023-11-27 NOTE — Progress Notes (Signed)
PROGRESS NOTE    Jon Smith  [id number]:[id number] DOB: Jul 14, 1928 DOA: 11/25/2023 PCP: [name], NP    Assessment & Plan:   Principal Problem:   Acute on chronic anemia Active Problems:   Chronic anemia   Essential hypertension   BPH (benign prostatic hyperplasia)   Hypercholesteremia   Hyperkalemia   Generalized weakness   CKD stage 3b, GFR 30-44 ml/min (HCC)   Depression  Assessment and Plan: Acute on chronic anemia: s/p 1 unit of pRBC transfused. Repeat H&H are trending up. Pt denies any bleeding from anywhere. Holding aspirin, plavix.    Chronic anemia: continue on home iron supplements.   B12 deficiency: IM B12 x1. Will start po B12 supplement tomorrow.   HTN: continue on home dose of metoprolol, lisinopril    BPH: continue on home dose of tamsulosin, finasteride    Depression: severity unknown. Continue on home dose of sertraline    CKDIIIb: Cr is trending up from day prior. Avoid nephrotoxic meds    Generalized weakness: PT/OT consulted    Hyperkalemia: resolved    HLD: continue on statin       DVT prophylaxis: SCDs Code Status: full  Family Communication: called pt's son, [name], no answer so I left a voicemail. Disposition Plan: depends on PT/OT recs  Level of care: Telemetry Medical Status is: Inpatient Remains inpatient appropriate because: severity of illness    Consultants:    Procedures:   Antimicrobials:    Subjective: Pt c/o fatigue  Objective: Vitals:   11/26/23 2016 11/27/23 0402 11/27/23 0643 11/27/23 0750  BP: 137/61 (!) 148/71  (!) 154/92  Pulse: 79 68  86  Resp: 18 20  20   Temp: 97.6 F (36.4 C) (!) 96.8 F (36 C)  98.2 F (36.8 C)  TempSrc: Oral Axillary    SpO2: 100% 100%  97%  Weight:   61.1 kg     Intake/Output Summary (Last 24 hours) at 11/27/2023 1440 Last data filed at 11/27/2023 0944 Gross per 24 hour  Intake 240 ml  Output 350 ml  Net -110 ml   Filed Weights   11/27/23 0643  Weight:  61.1 kg    Examination:  General exam: Appears calm and comfortable  Respiratory system: Clear to auscultation. Respiratory effort normal. Cardiovascular system: S1 & S2+. No  rubs, gallops or clicks. Gastrointestinal system: Abdomen is nondistended, soft and nontender. Normal bowel sounds heard. Central nervous system: Alert and oriented person, month & year Psychiatry: Judgement and insight appears poor. Flat mood and affect    Data Reviewed: I have personally reviewed following labs and imaging studies  CBC: Recent Labs  Lab 11/25/23 1136 11/26/23 0602 11/27/23 0347  WBC 8.2 8.7 9.5  HGB 6.8* 7.6* 8.7*  HCT 22.0* 22.6* 27.3*  MCV 91.7 87.3 90.4  PLT 291 269 315   Basic Metabolic Panel: Recent Labs  Lab 11/25/23 1136 11/26/23 0602 11/27/23 0347  NA 132* 129* 133*  K 5.6* 4.8 4.7  CL 107 105 104  CO2 19* 19* 19*  GLUCOSE 111* 103* 107*  BUN 53* 46* 45*  CREATININE 1.71* 1.73* 1.58*  CALCIUM 8.9 8.5* 9.0  MG 2.4  --  2.4  PHOS  --   --  2.7   GFR: CrCl cannot be calculated (Unknown ideal weight.). Liver Function Tests: No results for input(s): "AST", "ALT", "ALKPHOS", "BILITOT", "PROT", "ALBUMIN" in the last 168 hours. No results for input(s): "LIPASE", "AMYLASE" in the last 168 hours. No results for input(s): "AMMONIA" in the  last 168 hours. Coagulation Profile: No results for input(s): "INR", "PROTIME" in the last 168 hours. Cardiac Enzymes: No results for input(s): "CKTOTAL", "CKMB", "CKMBINDEX", "TROPONINI" in the last 168 hours. BNP (last 3 results) No results for input(s): "PROBNP" in the last 8760 hours. HbA1C: No results for input(s): "HGBA1C" in the last 72 hours. CBG: No results for input(s): "GLUCAP" in the last 168 hours. Lipid Profile: No results for input(s): "CHOL", "HDL", "LDLCALC", "TRIG", "CHOLHDL", "LDLDIRECT" in the last 72 hours. Thyroid Function Tests: No results for input(s): "TSH", "T4TOTAL", "FREET4", "T3FREE", "THYROIDAB" in  the last 72 hours. Anemia Panel: Recent Labs    11/25/23 1136  VITAMINB12 270  FOLATE 8.0  FERRITIN 16*  TIBC 360  IRON 72  RETICCTPCT 3.0   Sepsis Labs: Recent Labs  Lab 11/25/23 1136 11/25/23 1411  LATICACIDVEN 2.2* 1.5    No results found for this or any previous visit (from the past 240 hour(s)).       Radiology Studies: No results found.      Scheduled Meds:  atorvastatin  40 mg Oral QHS   vitamin B-12  1,000 mcg Oral Daily   feeding supplement  237 mL Oral TID BM   ferrous sulfate  325 mg Oral BID   finasteride  5 mg Oral Daily   lisinopril  40 mg Oral Daily   metoprolol tartrate  25 mg Oral BID   multivitamin with minerals  1 tablet Oral Daily   sertraline  50 mg Oral Daily   tamsulosin  0.4 mg Oral QPC supper   Continuous Infusions:  sodium chloride Stopped (11/25/23 1418)     LOS: 2 days     Charise Killian, MD [facility] Pager [phone]  If 7PM-7AM, please contact night-coverage [email or web address] 11/27/2023, 2:40 PM

## 2023-11-28 DIAGNOSIS — D649 Anemia, unspecified: Secondary | ICD-10-CM | POA: Diagnosis not present

## 2023-11-28 LAB — CBC
HCT: 23.8 % — ABNORMAL LOW (ref 39.0–52.0)
Hemoglobin: 7.9 g/dL — ABNORMAL LOW (ref 13.0–17.0)
MCH: 29.6 pg (ref 26.0–34.0)
MCHC: 33.2 g/dL (ref 30.0–36.0)
MCV: 89.1 fL (ref 80.0–100.0)
Platelets: 297 10*3/uL (ref 150–400)
RBC: 2.67 MIL/uL — ABNORMAL LOW (ref 4.22–5.81)
RDW: 17.4 % — ABNORMAL HIGH (ref 11.5–15.5)
WBC: 10.1 10*3/uL (ref 4.0–10.5)
nRBC: 0 % (ref 0.0–0.2)

## 2023-11-28 LAB — MAGNESIUM: Magnesium: 2.3 mg/dL (ref 1.7–2.4)

## 2023-11-28 LAB — GLUCOSE, CAPILLARY: Glucose-Capillary: 99 mg/dL (ref 70–99)

## 2023-11-28 LAB — BASIC METABOLIC PANEL
Anion gap: 10 (ref 5–15)
BUN: 49 mg/dL — ABNORMAL HIGH (ref 8–23)
CO2: 17 mmol/L — ABNORMAL LOW (ref 22–32)
Calcium: 8.9 mg/dL (ref 8.9–10.3)
Chloride: 109 mmol/L (ref 98–111)
Creatinine, Ser: 1.94 mg/dL — ABNORMAL HIGH (ref 0.61–1.24)
GFR, Estimated: 32 mL/min — ABNORMAL LOW (ref >60)
Glucose, Bld: 108 mg/dL — ABNORMAL HIGH (ref 70–99)
Potassium: 4.8 mmol/L (ref 3.5–5.1)
Sodium: 136 mmol/L (ref 135–145)

## 2023-11-28 LAB — PHOSPHORUS: Phosphorus: 3 mg/dL (ref 2.5–4.6)

## 2023-11-28 NOTE — Progress Notes (Signed)
PROGRESS NOTE    Jon Smith  GLO:[id number] DOB: 1928-05-08 DOA: 11/25/2023 PCP: [name], NP    Assessment & Plan:   Principal Problem:   Acute on chronic anemia Active Problems:   Chronic anemia   Essential hypertension   BPH (benign prostatic hyperplasia)   Hypercholesteremia   Hyperkalemia   Generalized weakness   CKD stage 3b, GFR 30-44 ml/min (HCC)   Depression  Assessment and Plan: Acute on chronic anemia: s/p 1 unit of pRCBCs transfused so far. H&H are labile. Holding home dose of aspirin, plavix   Acute encephalopathy: etiology unclear, delirium vs dementia. Baseline mental status is unknown. Possible dementia as per pt's son. S/p geodon x 1.   Chronic anemia: continue on iron supplements.   B12 deficiency: s/p B12 x 1. Continue on po B12 supplements   HTN: continue on home dose of metoprolol, lisinopril    BPH: continue on home dose of tamsulosin, finasteride    Depression: severity unknown. Continue on home dose of sertraline    CKDIIIb: Cr is labile. Avoid nephrotoxic meds    Generalized weakness: PT/OT recs HH   Hyperkalemia: resolved    HLD: continue on statin      DVT prophylaxis: SCDs Code Status: full  Family Communication: called pt's son, [name], and answered his questions  Disposition Plan: likely d/c home w/ HH   Level of care: Telemetry Medical Status is: Inpatient Remains inpatient appropriate because: severity of illness    Consultants:    Procedures:   Antimicrobials:    Subjective: Pt is confused   Objective: Vitals:   11/27/23 2021 11/27/23 2100 11/28/23 0332 11/28/23 0439  BP:   115/85   Pulse: 87 88 91   Resp:   18   Temp:   98.3 F (36.8 C)   TempSrc:   Oral   SpO2: 94% 95% 100%   Weight:    60.5 kg    Intake/Output Summary (Last 24 hours) at 11/28/2023 0819 Last data filed at 11/28/2023 0651 Gross per 24 hour  Intake 960 ml  Output 850 ml  Net 110 ml   Filed Weights   11/27/23  0643 11/28/23 0439  Weight: 61.1 kg 60.5 kg    Examination:  General exam: appears lethargic  Respiratory system: clear breath sounds b/l. Cardiovascular system: S1 & S2+. No rubs or clicks Gastrointestinal system: abd is soft, NT, ND & hypoactive bowel sounds Central nervous system: lethargic. Moves all extremities  Psychiatry: judgement and insight appears poor.    Data Reviewed: I have personally reviewed following labs and imaging studies  CBC: Recent Labs  Lab 11/25/23 1136 11/26/23 0602 11/27/23 0347 11/28/23 0522  WBC 8.2 8.7 9.5 10.1  HGB 6.8* 7.6* 8.7* 7.9*  HCT 22.0* 22.6* 27.3* 23.8*  MCV 91.7 87.3 90.4 89.1  PLT 291 269 315 297   Basic Metabolic Panel: Recent Labs  Lab 11/25/23 1136 11/26/23 0602 11/27/23 0347 11/28/23 0522  NA 132* 129* 133* 136  K 5.6* 4.8 4.7 4.8  CL 107 105 104 109  CO2 19* 19* 19* 17*  GLUCOSE 111* 103* 107* 108*  BUN 53* 46* 45* 49*  CREATININE 1.71* 1.73* 1.58* 1.94*  CALCIUM 8.9 8.5* 9.0 8.9  MG 2.4  --  2.4 2.3  PHOS  --   --  2.7 3.0   GFR: CrCl cannot be calculated (Unknown ideal weight.). Liver Function Tests: No results for input(s): "AST", "ALT", "ALKPHOS", "BILITOT", "PROT", "ALBUMIN" in the last 168 hours. No results  for input(s): "LIPASE", "AMYLASE" in the last 168 hours. No results for input(s): "AMMONIA" in the last 168 hours. Coagulation Profile: No results for input(s): "INR", "PROTIME" in the last 168 hours. Cardiac Enzymes: No results for input(s): "CKTOTAL", "CKMB", "CKMBINDEX", "TROPONINI" in the last 168 hours. BNP (last 3 results) No results for input(s): "PROBNP" in the last 8760 hours. HbA1C: No results for input(s): "HGBA1C" in the last 72 hours. CBG: No results for input(s): "GLUCAP" in the last 168 hours. Lipid Profile: No results for input(s): "CHOL", "HDL", "LDLCALC", "TRIG", "CHOLHDL", "LDLDIRECT" in the last 72 hours. Thyroid Function Tests: No results for input(s): "TSH", "T4TOTAL",  "FREET4", "T3FREE", "THYROIDAB" in the last 72 hours. Anemia Panel: Recent Labs    11/25/23 1136  VITAMINB12 270  FOLATE 8.0  FERRITIN 16*  TIBC 360  IRON 72  RETICCTPCT 3.0   Sepsis Labs: Recent Labs  Lab 11/25/23 1136 11/25/23 1411  LATICACIDVEN 2.2* 1.5    No results found for this or any previous visit (from the past 240 hour(s)).       Radiology Studies: No results found.      Scheduled Meds:  atorvastatin  40 mg Oral QHS   vitamin B-12  1,000 mcg Oral Daily   feeding supplement  237 mL Oral TID BM   ferrous sulfate  325 mg Oral BID   finasteride  5 mg Oral Daily   lisinopril  40 mg Oral Daily   metoprolol tartrate  25 mg Oral BID   multivitamin with minerals  1 tablet Oral Daily   sertraline  50 mg Oral Daily   tamsulosin  0.4 mg Oral QPC supper   Continuous Infusions:  sodium chloride Stopped (11/25/23 1418)     LOS: 3 days     Charise Killian, MD [facility] Pager [phone]  If 7PM-7AM, please contact night-coverage [email or web address] 11/28/2023, 8:19 AM

## 2023-11-28 NOTE — Progress Notes (Addendum)
PT Cancellation Note  Patient Details Name: Jon Smith MRN: [id number] DOB: 01/12/1928   Cancelled Treatment:     PT attempt. Pt sleeping soundly with sitter at bedside. Will return at a more appropriate time to progress pt with OOB activity.    1357: 2nd attempt, sitter assisting pt with feeding/eating. Will return at a later time.    1540: 3rd attempt this date, pt currently in bed with blankets over his head. Unwilling to participate at this time. Acute PT will continue effort to ptrogress per current POC.   Rushie Chestnut 11/28/2023, 12:05 PM

## 2023-11-28 NOTE — Plan of Care (Signed)
  Problem: Health Behavior/Discharge Planning: Goal: Ability to manage health-related needs will improve Outcome: Progressing   Problem: Activity: Goal: Risk for activity intolerance will decrease Outcome: Progressing   Problem: Nutrition: Goal: Adequate nutrition will be maintained Outcome: Progressing   Problem: Elimination: Goal: Will not experience complications related to bowel motility Outcome: Progressing Goal: Will not experience complications related to urinary retention Outcome: Progressing   Problem: Safety: Goal: Ability to remain free from injury will improve Outcome: Progressing

## 2023-11-28 NOTE — TOC Progression Note (Signed)
Transition of Care [facility]) - Progression Note    Patient Details  Name: Jon Smith MRN: [id number] Date of Birth: Feb 02, 1928  [facility] CM/SW Contact  Margarito Liner, LCSW Phone Number: 11/28/2023, 2:11 PM  Clinical Narrative:   CSW spoke to [place] at [employer]. 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 They can accept him back whenever stable. CSW called son who is agreeable to home health PT and OT through [place]. Liaison confirmed they can accept referral. Patient will need EMS transport back to the facility if leaving today. ALF transport is gone for the day and son is out of town.  Expected Discharge Plan: Assisted Living (with home health) Barriers to Discharge: Continued Medical Work up  Expected Discharge Plan and Services     Post Acute Care Choice:  (TBD) Living arrangements for the past 2 months: Assisted Living Facility                                       Social Determinants of Health (SDOH) Interventions SDOH Screenings   Food Insecurity: Patient Unable To Answer (11/25/2023)  Housing: Patient Unable To Answer (04/11/2023)  Transportation Needs: No Transportation Needs (11/25/2023)  Utilities: Not At Risk (11/25/2023)  Financial Resource Strain: High Risk (07/25/2020)   Received from [facility], [facility]  Tobacco Use: Medium Risk (11/25/2023)    Readmission Risk Interventions     No data to display

## 2023-11-29 DIAGNOSIS — D649 Anemia, unspecified: Secondary | ICD-10-CM | POA: Diagnosis not present

## 2023-11-29 LAB — BASIC METABOLIC PANEL
Anion gap: 9 (ref 5–15)
BUN: 51 mg/dL — ABNORMAL HIGH (ref 8–23)
CO2: 18 mmol/L — ABNORMAL LOW (ref 22–32)
Calcium: 8.9 mg/dL (ref 8.9–10.3)
Chloride: 107 mmol/L (ref 98–111)
Creatinine, Ser: 1.79 mg/dL — ABNORMAL HIGH (ref 0.61–1.24)
GFR, Estimated: 35 mL/min — ABNORMAL LOW (ref >60)
Glucose, Bld: 111 mg/dL — ABNORMAL HIGH (ref 70–99)
Potassium: 4.7 mmol/L (ref 3.5–5.1)
Sodium: 134 mmol/L — ABNORMAL LOW (ref 135–145)

## 2023-11-29 LAB — CBC
HCT: 25.9 % — ABNORMAL LOW (ref 39.0–52.0)
Hemoglobin: 8.3 g/dL — ABNORMAL LOW (ref 13.0–17.0)
MCH: 28.8 pg (ref 26.0–34.0)
MCHC: 32 g/dL (ref 30.0–36.0)
MCV: 89.9 fL (ref 80.0–100.0)
Platelets: 302 10*3/uL (ref 150–400)
RBC: 2.88 MIL/uL — ABNORMAL LOW (ref 4.22–5.81)
RDW: 18.1 % — ABNORMAL HIGH (ref 11.5–15.5)
WBC: 10.6 10*3/uL — ABNORMAL HIGH (ref 4.0–10.5)
nRBC: 0 % (ref 0.0–0.2)

## 2023-11-29 MED ORDER — CYANOCOBALAMIN 1000 MCG PO TABS: 1000.0000 ug | ORAL_TABLET | Freq: Every day | ORAL | 0 refills | Status: AC

## 2023-11-29 NOTE — Discharge Summary (Signed)
Physician Discharge Summary  Sigfred Guernsey VQQ:[id number] DOB: [birth year] DOA: 11/25/2023  PCP: [name], NP  Admit date: 11/25/2023 Discharge date: 11/29/2023  Admitted From:  ALF Disposition:  ALF  Recommendations for Outpatient Follow-up:  Follow up with PCP in 1-2 weeks  Home Health: yes Equipment/Devices:  Discharge Condition: stable  CODE STATUS: full  Diet recommendation: Regular   Brief/Interim Summary: HPI was taken from Dr. Penne Lash:  Jon Smith is a 89 y.o. male with past medical history of CKD, hypertension, hyperlipidemia, prostate cancer, nephrolithiasis, here with abnormal labs.  Patient states that he was sent here because he had abnormal lab work at his facility.  He denies any major complaints.  Specifically, denies any headache, chest pain, shortness of breath.  Denies any nausea or vomiting.  Denies any abdominal pain.  Remainder of history limited due to mild dementia.  Discharge Diagnoses:  Principal Problem:   Acute on chronic anemia Active Problems:   Chronic anemia   Essential hypertension   BPH (benign prostatic hyperplasia)   Hypercholesteremia   Hyperkalemia   Generalized weakness   CKD stage 3b, GFR 30-44 ml/min (HCC)   Depression  Acute on chronic anemia: s/p 1 unit of pRCBCs transfused so far. H&H are trending up today. Can restart home dose of aspirin, plavix    Acute encephalopathy: etiology unclear, delirium vs dementia. Baseline mental status is unknown. Possible dementia as per pt's son. S/p geodon x 1.   Chronic anemia: continue on iron supplements.    B12 deficiency: s/p B12 x 1. Continue on po B12 supplements    HTN: continue on home dose of metoprolol, lisinopril    BPH: continue on home dose of tamsulosin, finasteride    Depression: severity unknown. Continue on home dose of sertraline    CKDIIIb: Cr is labile. Avoid nephrotoxic meds    Generalized weakness: PT/OT recs HH   Hyperkalemia: resolved    HLD:  continue on statin   Discharge Instructions  Discharge Instructions     Diet general   Complete by: As directed    Discharge instructions   Complete by: As directed    F/u w/ PCP in 1-2 weeks   Increase activity slowly   Complete by: As directed       Allergies as of 11/29/2023       Reactions   Aspirin Nausea And Vomiting   Sulfamethoxazole-trimethoprim    Other reaction(s): Blood Disorder Coagulopathy        Medication List     STOP taking these medications    guaifenesin 100 MG/5ML syrup Commonly known as: ROBITUSSIN       TAKE these medications    acetaminophen 325 MG tablet Commonly known as: TYLENOL Take 650 mg by mouth every 6 (six) hours as needed for mild pain.   amoxicillin-clavulanate 875-125 MG tablet Commonly known as: AUGMENTIN Take 1 tablet by mouth 2 (two) times daily.   Antacid Liquid 200-200-20 MG/5ML suspension Generic drug: alum & mag hydroxide-simeth Take by mouth 4 (four) times daily as needed for indigestion or heartburn.   Aspirin Low Dose 81 MG tablet Generic drug: aspirin EC Take 81 mg by mouth daily.   atorvastatin 40 MG tablet Commonly known as: LIPITOR Take 40 mg by mouth daily.   clopidogrel 75 MG tablet Commonly known as: PLAVIX Take by mouth.   cyanocobalamin 1000 MCG tablet Take 1 tablet (1,000 mcg total) by mouth daily. Start taking on: November 30, 2023   ferrous sulfate 325 (65 FE)  MG EC tablet Take 1 tablet by mouth in the morning and at bedtime.   finasteride 5 MG tablet Commonly known as: PROSCAR Take 1 tablet (5 mg total) by mouth daily.   latanoprost 0.005 % ophthalmic solution Commonly known as: XALATAN Place 1 drop into both eyes at bedtime.   lisinopril 40 MG tablet Commonly known as: ZESTRIL Take 40 mg by mouth daily.   loperamide 2 MG tablet Commonly known as: IMODIUM A-D Take 2 mg by mouth 4 (four) times daily as needed for diarrhea or loose stools.   magnesium hydroxide 400 MG/5ML  suspension Commonly known as: MILK OF MAGNESIA Take 30 mLs by mouth daily as needed for mild constipation.   metoprolol tartrate 25 MG tablet Commonly known as: LOPRESSOR Take 25 mg by mouth 2 (two) times daily.   sertraline 50 MG tablet Commonly known as: ZOLOFT Take 50 mg by mouth daily.   tamsulosin 0.4 MG Caps capsule Commonly known as: FLOMAX Take 1 capsule (0.4 mg total) by mouth daily. At bedtime        Allergies  Allergen Reactions   Aspirin Nausea And Vomiting   Sulfamethoxazole-Trimethoprim     Other reaction(s): Blood Disorder Coagulopathy    Consultations:    Procedures/Studies: No results found. (Echo, Carotid, EGD, Colonoscopy, ERCP)    Subjective: Pt c/o fatigue    Discharge Exam: Vitals:   11/29/23 0407 11/29/23 0842  BP: (!) 126/46 121/60  Pulse: 82 82  Resp: 15 17  Temp: 98.3 F (36.8 C) 98.1 F (36.7 C)  SpO2: 100% 96%   Vitals:   11/28/23 2050 11/29/23 0407 11/29/23 0500 11/29/23 0842  BP: (!) 149/61 (!) 126/46  121/60  Pulse: 63 82  82  Resp: 17 15  17   Temp: 98.4 F (36.9 C) 98.3 F (36.8 C)  98.1 F (36.7 C)  TempSrc: Oral Oral  Oral  SpO2: 100% 100%  96%  Weight:   59.8 kg     General: Pt is alert, awake, not in acute distress Cardiovascular: S1/S2 +, no rubs, no gallops Respiratory: CTA bilaterally, no wheezing, no rhonchi Abdominal: Soft, NT, ND, bowel sounds + Extremities: no edema, no cyanosis    The results of significant diagnostics from this hospitalization (including imaging, microbiology, ancillary and laboratory) are listed below for reference.     Microbiology: No results found for this or any previous visit (from the past 240 hour(s)).   Labs: BNP (last 3 results) No results for input(s): "BNP" in the last 8760 hours. Basic Metabolic Panel: Recent Labs  Lab 11/25/23 1136 11/26/23 0602 11/27/23 0347 11/28/23 0522 11/29/23 0405  NA 132* 129* 133* 136 134*  K 5.6* 4.8 4.7 4.8 4.7  CL 107 105  104 109 107  CO2 19* 19* 19* 17* 18*  GLUCOSE 111* 103* 107* 108* 111*  BUN 53* 46* 45* 49* 51*  CREATININE 1.71* 1.73* 1.58* 1.94* 1.79*  CALCIUM 8.9 8.5* 9.0 8.9 8.9  MG 2.4  --  2.4 2.3  --   PHOS  --   --  2.7 3.0  --    Liver Function Tests: No results for input(s): "AST", "ALT", "ALKPHOS", "BILITOT", "PROT", "ALBUMIN" in the last 168 hours. No results for input(s): "LIPASE", "AMYLASE" in the last 168 hours. No results for input(s): "AMMONIA" in the last 168 hours. CBC: Recent Labs  Lab 11/25/23 1136 11/26/23 0602 11/27/23 0347 11/28/23 0522 11/29/23 0405  WBC 8.2 8.7 9.5 10.1 10.6*  HGB 6.8* 7.6* 8.7* 7.9* 8.3*  HCT 22.0* 22.6* 27.3* 23.8* 25.9*  MCV 91.7 87.3 90.4 89.1 89.9  PLT 291 269 315 297 302   Cardiac Enzymes: No results for input(s): "CKTOTAL", "CKMB", "CKMBINDEX", "TROPONINI" in the last 168 hours. BNP: Invalid input(s): "POCBNP" CBG: Recent Labs  Lab 11/28/23 1343  GLUCAP 99   D-Dimer No results for input(s): "DDIMER" in the last 72 hours. Hgb A1c No results for input(s): "HGBA1C" in the last 72 hours. Lipid Profile No results for input(s): "CHOL", "HDL", "LDLCALC", "TRIG", "CHOLHDL", "LDLDIRECT" in the last 72 hours. Thyroid function studies No results for input(s): "TSH", "T4TOTAL", "T3FREE", "THYROIDAB" in the last 72 hours.  Invalid input(s): "FREET3" Anemia work up No results for input(s): "VITAMINB12", "FOLATE", "FERRITIN", "TIBC", "IRON", "RETICCTPCT" in the last 72 hours. Urinalysis    Component Value Date/Time   COLORURINE YELLOW (A) 04/11/2023 1221   APPEARANCEUR CLOUDY (A) 04/11/2023 1221   APPEARANCEUR Clear 01/16/2016 1500   LABSPEC 1.012 04/11/2023 1221   PHURINE 5.0 04/11/2023 1221   GLUCOSEU NEGATIVE 04/11/2023 1221   HGBUR MODERATE (A) 04/11/2023 1221   BILIRUBINUR NEGATIVE 04/11/2023 1221   BILIRUBINUR Negative 01/16/2016 1500   KETONESUR NEGATIVE 04/11/2023 1221   PROTEINUR 30 (A) 04/11/2023 1221   NITRITE NEGATIVE  04/11/2023 1221   LEUKOCYTESUR SMALL (A) 04/11/2023 1221   Sepsis Labs Recent Labs  Lab 11/26/23 0602 11/27/23 0347 11/28/23 0522 11/29/23 0405  WBC 8.7 9.5 10.1 10.6*   Microbiology No results found for this or any previous visit (from the past 240 hour(s)).   Time coordinating discharge: Over 30 minutes  SIGNED:   Charise Killian, MD  [facility] 11/29/2023, 11:48 AM Pager   If 7PM-7AM, please contact night-coverage [email or web address]

## 2023-11-29 NOTE — [place] FL2 (Signed)
[place] MEDICAID FL2 LEVEL OF CARE FORM     IDENTIFICATION  Patient Name: Jon Smith Birthdate: 26-Jun-1928 Sex: male Admission Date (Current Location): 11/25/2023  [place] and [place] Number:  Chiropodist and Address:  [facility], [address], [place], [place] [address]      Provider Number: [id number]  Attending Physician Name and Address:  Charise Killian, MD  Relative Name and Phone Number:  [phone]  Gracie Relaford - son    Current Level of Care: Hospital Recommended Level of Care: Assisted Living Facility Prior Approval Number:    Date Approved/Denied:   PASRR Number:    Discharge Plan:      Current Diagnoses: Patient Active Problem List   Diagnosis Date Noted   Acute on chronic anemia 11/25/2023   CKD stage 3b, GFR 30-44 ml/min (HCC) 11/25/2023   Depression 11/25/2023   UTI (urinary tract infection) 04/11/2023   Altered mental status 04/11/2023   Generalized weakness 04/11/2023   BPH (benign prostatic hyperplasia) 04/11/2023   CVA (cerebral vascular accident) (HCC) 04/11/2023   Dysphagia 04/11/2023   Renovascular hypertension 10/13/2020   Disease characterized by destruction of skeletal muscle 07/25/2020   Elevated AST (SGOT) 07/25/2020   Fall 07/25/2020   Hyperkalemia 07/25/2020   Urinary retention 07/25/2020   Vitamin B12 deficiency 08/20/2019   Generalized muscle weakness 08/18/2019   Asymptomatic proteinuria 02/22/2019   Vitamin D deficiency 02/22/2019   Chronic anemia 02/21/2019   High risk medication use 02/21/2019   Scalp hematoma 04/28/2018   Urine test positive for microalbuminuria 08/27/2017   Neck pain 07/01/2017   DDD (degenerative disc disease), cervical 07/01/2017   Prostate cancer (HCC) 01/16/2016   Elevated PSA 01/16/2016   Protein-calorie malnutrition, severe 12/12/2015   Gross hematuria    Acute hepatitis 12/08/2015   Elevated LFTs    Abnormal liver function tests 12/06/2015    H/O malignant neoplasm of prostate 09/18/2015   Hypercholesteremia 05/17/2014   Essential hypertension 05/17/2014   Photodermatitis due to sun 05/17/2014    Orientation RESPIRATION BLADDER Height & Weight        Normal Incontinent, External catheter Weight: 131 lb 13.4 oz (59.8 kg) Height:     BEHAVIORAL SYMPTOMS/MOOD NEUROLOGICAL BOWEL NUTRITION STATUS      Incontinent Diet (regular)  AMBULATORY STATUS COMMUNICATION OF NEEDS Skin   Limited Assist Verbally Normal                       Personal Care Assistance Level of Assistance  Bathing, Feeding, Dressing Bathing Assistance: Limited assistance Feeding assistance: Limited assistance Dressing Assistance: Limited assistance     Functional Limitations Info             SPECIAL CARE FACTORS FREQUENCY  PT (By licensed PT), OT (By licensed OT)     PT Frequency: home health OT Frequency: home health            Contractures      Additional Factors Info  Code Status, Allergies Code Status Info: full Allergies Info: Aspirin, Sulfamethoxazole-trimethoprim           Current Medications (11/29/2023):   Medication List       STOP taking these medications     guaifenesin 100 MG/5ML syrup Commonly known as: ROBITUSSIN           TAKE these medications     acetaminophen 325 MG tablet Commonly known as: TYLENOL Take 650 mg by mouth every 6 (six) hours  as needed for mild pain.    amoxicillin-clavulanate 875-125 MG tablet Commonly known as: AUGMENTIN Take 1 tablet by mouth 2 (two) times daily.    Antacid Liquid 200-200-20 MG/5ML suspension Generic drug: alum & mag hydroxide-simeth Take by mouth 4 (four) times daily as needed for indigestion or heartburn.    Aspirin Low Dose 81 MG tablet Generic drug: aspirin EC Take 81 mg by mouth daily.    atorvastatin 40 MG tablet Commonly known as: LIPITOR Take 40 mg by mouth daily.    clopidogrel 75 MG tablet Commonly known as: PLAVIX Take by mouth.     cyanocobalamin 1000 MCG tablet Take 1 tablet (1,000 mcg total) by mouth daily. Start taking on: November 30, 2023    ferrous sulfate 325 (65 FE) MG EC tablet Take 1 tablet by mouth in the morning and at bedtime.    finasteride 5 MG tablet Commonly known as: PROSCAR Take 1 tablet (5 mg total) by mouth daily.    latanoprost 0.005 % ophthalmic solution Commonly known as: XALATAN Place 1 drop into both eyes at bedtime.    lisinopril 40 MG tablet Commonly known as: ZESTRIL Take 40 mg by mouth daily.    loperamide 2 MG tablet Commonly known as: IMODIUM A-D Take 2 mg by mouth 4 (four) times daily as needed for diarrhea or loose stools.    magnesium hydroxide 400 MG/5ML suspension Commonly known as: MILK OF MAGNESIA Take 30 mLs by mouth daily as needed for mild constipation.    metoprolol tartrate 25 MG tablet Commonly known as: LOPRESSOR Take 25 mg by mouth 2 (two) times daily.    sertraline 50 MG tablet Commonly known as: ZOLOFT Take 50 mg by mouth daily.    tamsulosin 0.4 MG Caps capsule Commonly known as: FLOMAX Take 1 capsule (0.4 mg total) by mouth daily. At bedtime      Relevant Imaging Results:  Relevant Lab Results:   Additional Information SS #: [id number]  Rainy Rothman E Lubertha Leite, LCSW

## 2023-11-29 NOTE — TOC Transition Note (Addendum)
Transition of Care [facility]) - CM/SW Discharge Note   Patient Details  Name: Jon Smith MRN: [id number] Date of Birth: [birth year]  Transition of Care [facility]) CM/SW Contact:  Liliana Cline, LCSW Phone Number: 11/29/2023, 11:43 AM   Clinical Narrative:    CSW called [facility] [place] [facility] [place], spoke with Meda Klinefelter. She confirmed patient can return today and she is his Tech today. Patient will return to his Room 101 per [facility].  Left VM for patient's son [name].  EMS paperwork completed and placed in DC packet, will call when patient is ready for transport.  Will fax FL2 with DC Med List when ready, Elsie Saas denies additional needs or need to call report.  12:40- Faxed DC Summary and FL2 to [facility]. Called for [facility], patient is next on the list. RN notified. Notified [facility] Representative [place].  Final next level of care: Assisted Living Barriers to Discharge: Barriers Resolved   Patient Goals and CMS Choice      Discharge Placement                  Patient to be transferred to facility by: [facility] Name of family member notified: left VM for son [name] Patient and family notified of of transfer: 11/29/23  Discharge Plan and Services Additional resources added to the After Visit Summary for       Post Acute Care Choice:  (TBD)                               Social Determinants of Health (SDOH) Interventions SDOH Screenings   Food Insecurity: Patient Unable To Answer (11/25/2023)  Housing: Patient Unable To Answer (04/11/2023)  Transportation Needs: No Transportation Needs (11/25/2023)  Utilities: Not At Risk (11/25/2023)  Financial Resource Strain: High Risk (07/25/2020)   [facility] from [facility]  Tobacco Use: Medium Risk (11/25/2023)     Readmission Risk Interventions     No data to display

## 2023-11-30 ENCOUNTER — Emergency Department
Admission: EM | Admit: 2023-11-30 | Discharge: 2023-11-30 | Disposition: A | Discharge status: Home / Self Care | Payer: Medicare HMO | Attending: Emergency Medicine | Admitting: Emergency Medicine

## 2023-11-30 DIAGNOSIS — R4182 Altered mental status, unspecified: Secondary | ICD-10-CM | POA: Diagnosis not present

## 2023-11-30 DIAGNOSIS — D72829 Elevated white blood cell count, unspecified: Secondary | ICD-10-CM | POA: Insufficient documentation

## 2023-11-30 DIAGNOSIS — I129 Hypertensive chronic kidney disease with stage 1 through stage 4 chronic kidney disease, or unspecified chronic kidney disease: Secondary | ICD-10-CM | POA: Diagnosis not present

## 2023-11-30 DIAGNOSIS — F0390 Unspecified dementia without behavioral disturbance: Secondary | ICD-10-CM | POA: Diagnosis not present

## 2023-11-30 DIAGNOSIS — Z7401 Bed confinement status: Secondary | ICD-10-CM | POA: Diagnosis not present

## 2023-11-30 DIAGNOSIS — N179 Acute kidney failure, unspecified: Secondary | ICD-10-CM | POA: Insufficient documentation

## 2023-11-30 DIAGNOSIS — N189 Chronic kidney disease, unspecified: Secondary | ICD-10-CM | POA: Insufficient documentation

## 2023-11-30 DIAGNOSIS — E860 Dehydration: Secondary | ICD-10-CM | POA: Insufficient documentation

## 2023-11-30 DIAGNOSIS — M542 Cervicalgia: Secondary | ICD-10-CM | POA: Diagnosis not present

## 2023-11-30 DIAGNOSIS — R0902 Hypoxemia: Secondary | ICD-10-CM | POA: Diagnosis not present

## 2023-11-30 DIAGNOSIS — R69 Illness, unspecified: Secondary | ICD-10-CM | POA: Diagnosis not present

## 2023-11-30 DIAGNOSIS — Z743 Need for continuous supervision: Secondary | ICD-10-CM | POA: Diagnosis not present

## 2023-11-30 DIAGNOSIS — R450 Nervousness: Secondary | ICD-10-CM | POA: Diagnosis not present

## 2023-11-30 LAB — CBC WITH DIFFERENTIAL/PLATELET
Abs Immature Granulocytes: 0.05 10*3/uL (ref 0.00–0.07)
Basophils Absolute: 0 10*3/uL (ref 0.0–0.1)
Basophils Relative: 0 %
Eosinophils Absolute: 0 10*3/uL (ref 0.0–0.5)
Eosinophils Relative: 0 %
HCT: 29.7 % — ABNORMAL LOW (ref 39.0–52.0)
Hemoglobin: 9.4 g/dL — ABNORMAL LOW (ref 13.0–17.0)
Immature Granulocytes: 0 %
Lymphocytes Relative: 7 %
Lymphs Abs: 0.8 10*3/uL (ref 0.7–4.0)
MCH: 28.9 pg (ref 26.0–34.0)
MCHC: 31.6 g/dL (ref 30.0–36.0)
MCV: 91.4 fL (ref 80.0–100.0)
Monocytes Absolute: 1.2 10*3/uL — ABNORMAL HIGH (ref 0.1–1.0)
Monocytes Relative: 10 %
Neutro Abs: 10.2 10*3/uL — ABNORMAL HIGH (ref 1.7–7.7)
Neutrophils Relative %: 83 %
Platelets: 309 10*3/uL (ref 150–400)
RBC: 3.25 MIL/uL — ABNORMAL LOW (ref 4.22–5.81)
RDW: 18.3 % — ABNORMAL HIGH (ref 11.5–15.5)
WBC: 12.3 10*3/uL — ABNORMAL HIGH (ref 4.0–10.5)
nRBC: 0 % (ref 0.0–0.2)

## 2023-11-30 LAB — BASIC METABOLIC PANEL
Anion gap: 11 (ref 5–15)
BUN: 59 mg/dL — ABNORMAL HIGH (ref 8–23)
CO2: 19 mmol/L — ABNORMAL LOW (ref 22–32)
Calcium: 9.6 mg/dL (ref 8.9–10.3)
Chloride: 108 mmol/L (ref 98–111)
Creatinine, Ser: 2.3 mg/dL — ABNORMAL HIGH (ref 0.61–1.24)
GFR, Estimated: 26 mL/min — ABNORMAL LOW (ref >60)
Glucose, Bld: 123 mg/dL — ABNORMAL HIGH (ref 70–99)
Potassium: 4.9 mmol/L (ref 3.5–5.1)
Sodium: 138 mmol/L (ref 135–145)

## 2023-11-30 MED ORDER — SODIUM CHLORIDE 0.9 % IV BOLUS
1000.0000 mL | Freq: Once | INTRAVENOUS | Status: AC
Start: 2023-11-30 — End: 2023-11-30
  Administered 2023-11-30: 1000 mL via INTRAVENOUS

## 2023-11-30 NOTE — ED Provider Notes (Signed)
[facility] Provider Note    Event Date/Time   First MD Initiated Contact with Patient 11/30/23 [phone]     (approximate)   History   Altered Mental Status   HPI Jon Smith is a 89 y.o. male with history of CKD, HTN, HLD, mild dementia presenting today over concerns of possible altered mental status.  Patient was just discharged from the hospital yesterday to this facility.  They reported that he was apparently off from his baseline.  They cannot describe elsewise what was going on.  Patient himself in the room has no acute complaints.  Denies chest pain, shortness of breath, abdominal pain, nausea, vomiting, leg pain.  Chart review: Patient just discharged from the hospital yesterday for anemia.  Had slightly altered mental status while here in the hospital with concern for delirium versus dementia more supportive of dementia.  Discharged in stable condition yesterday.     Physical Exam   Triage Vital Signs: ED Triage Vitals [11/30/23 0649]  Encounter Vitals Group     BP (!) 152/60     Systolic BP Percentile      Diastolic BP Percentile      Pulse Rate 80     Resp 18     Temp      Temp src      SpO2 100 %     Weight      Height      Head Circumference      Peak Flow      Pain Score 3     Pain Loc      Pain Education      Exclude from Growth Chart     Most recent vital signs: Vitals:   11/30/23 0712 11/30/23 0800  BP:  124/60  Pulse:  81  Resp:    Temp: 97.9 F (36.6 C)   SpO2:  100%   I have reviewed the vital signs. General:  Awake, alert, no acute distress. Head:  Normocephalic, Atraumatic. EENT:  PERRL, EOMI, Oral mucosa pink and moist, Neck is supple. Cardiovascular: Regular rate, 2+ distal pulses. Respiratory:  Normal respiratory effort, symmetrical expansion, no distress.   Extremities:  Moving all four extremities through full ROM without pain.   Neuro:  Alert and oriented.  Interacting appropriately.  Oriented to person  and place but not to time. Skin:  Warm, dry, no rash.   Psych: Appropriate affect.    ED Results / Procedures / Treatments   Labs (all labs ordered are listed, but only abnormal results are displayed) Labs Reviewed  CBC WITH DIFFERENTIAL/PLATELET - Abnormal; Notable for the following components:      Result Value   WBC 12.3 (*)    RBC 3.25 (*)    Hemoglobin 9.4 (*)    HCT 29.7 (*)    RDW 18.3 (*)    Neutro Abs 10.2 (*)    Monocytes Absolute 1.2 (*)    All other components within normal limits  BASIC METABOLIC PANEL - Abnormal; Notable for the following components:   CO2 19 (*)    Glucose, Bld 123 (*)    BUN 59 (*)    Creatinine, Ser 2.30 (*)    GFR, Estimated 26 (*)    All other components within normal limits     EKG My EKG interpretation: Rate of 79, normal sinus rhythm, normal axis.  No acute ST elevations or depressions   RADIOLOGY    PROCEDURES:  Critical Care performed: No  Procedures  MEDICATIONS ORDERED IN ED: Medications  sodium chloride 0.9 % bolus 1,000 mL (1,000 mLs Intravenous New Bag/Given 11/30/23 0813)     IMPRESSION / MDM / ASSESSMENT AND PLAN / ED COURSE  I reviewed the triage vital signs and the nursing notes.                              Differential diagnosis includes, but is not limited to, dementia, delirium, lower suspicion for infection  Patient's presentation is most consistent with acute complicated illness / injury requiring diagnostic workup.  Patient is a 89 year old male presenting today for concerns of altered mental status with baseline history of dementia.  Patient has no acute complaints and vital signs are stable with unremarkable physical exam.  Patient is oriented to name and place but not time.  This appears consistent with all the physical exam notes during his recent hospitalization and discharged yesterday.  Basic blood work was obtained.  Laboratory workup shows a slight AKI on CKD which we will give fluids for but  otherwise reassuring.  Mild leukocytosis seen on CBC but patient is actively on Augmentin for treatment.  No other infectious symptoms seen today.  Spoke with son on the phone and described patient's mental status here in the ED alert and oriented to name and place but not to time.  He states this is consistent with his baseline.  He states patient does have mild dementia and will intermittently get confused or agitated but can be reoriented relatively easily.  Given that patient seems at his mental status baseline with slight dehydration, will give fluids and discharged back to facility.  No additional workup indicated at this time but strict return precautions were sent with paperwork back to the facility.  The patient is on the cardiac monitor to evaluate for evidence of arrhythmia and/or significant heart rate changes. Clinical Course as of 11/30/23 0901  Sun Nov 30, 2023  0804 Basic metabolic panel(!) Slight AKI but otherwise comparable to baseline.  Will rehydrate with fluids today. [DW]  0813 CBC with Differential(!) Mild leukocytosis but no obvious infectious symptoms.  Relatively stable with yesterday.  Patient on Augmentin already [DW]    Clinical Course User Index [DW] Janith Lima, MD     FINAL CLINICAL IMPRESSION(S) / ED DIAGNOSES   Final diagnoses:  Dehydration  AKI (acute kidney injury) (HCC)     Rx / DC Orders   ED Discharge Orders     None        Note:  This document was prepared using Dragon voice recognition software and may include unintentional dictation errors.   Janith Lima, MD 11/30/23 [phone]

## 2023-11-30 NOTE — ED Triage Notes (Incomplete)
Pt to ED via EMS from the [facility] [place] [facility] [place]. Staff reports he was more altered than usual. Pt is reported to be

## 2023-11-30 NOTE — ED Triage Notes (Signed)
Pt to ED via EMS from the [facility] [place]. Staff reports he was more altered than usual. Pt is reported to be demented at baseline and he is A&O x3 for this RN.

## 2023-11-30 NOTE — Discharge Instructions (Signed)
Mr. Laffitte was seen in our ED and found to have slight dehydration.  He was given fluids here.  Please make sure he is eating and drinking a normal amount at the facility.  No other concerning findings.  He was at his normal mental status baseline with [place] here when we spoke with son and reviewing most recent admission notes.  Please send him back for any other concerning findings.

## 2023-11-30 NOTE — ED Notes (Signed)
[facility] called for EMS transport back to the [facility] spoke with [place].

## 2023-12-01 DIAGNOSIS — F0390 Unspecified dementia without behavioral disturbance: Secondary | ICD-10-CM | POA: Diagnosis not present

## 2023-12-01 DIAGNOSIS — N390 Urinary tract infection, site not specified: Secondary | ICD-10-CM | POA: Diagnosis not present

## 2023-12-01 DIAGNOSIS — E860 Dehydration: Secondary | ICD-10-CM | POA: Diagnosis not present

## 2023-12-01 DIAGNOSIS — N189 Chronic kidney disease, unspecified: Secondary | ICD-10-CM | POA: Diagnosis not present

## 2023-12-01 DIAGNOSIS — N179 Acute kidney failure, unspecified: Secondary | ICD-10-CM | POA: Diagnosis not present

## 2023-12-04 ENCOUNTER — Other Ambulatory Visit: Payer: Self-pay

## 2023-12-04 ENCOUNTER — Inpatient Hospital Stay (HOSPITAL_COMMUNITY)
Admission: EM | Admit: 2023-12-04 | Discharge: 2023-12-12 | DRG: 682 | Disposition: A | Discharge status: Skilled Nursing Facility | Payer: Medicare HMO | Source: Skilled Nursing Facility | Attending: Internal Medicine | Admitting: Internal Medicine

## 2023-12-04 ENCOUNTER — Emergency Department
Admission: EM | Admit: 2023-12-04 | Discharge: 2023-12-04 | Disposition: A | Discharge status: Home / Self Care | Payer: Medicare HMO | Attending: Student in an Organized Health Care Education/Training Program | Admitting: Student in an Organized Health Care Education/Training Program

## 2023-12-04 ENCOUNTER — Emergency Department: Payer: Medicare HMO

## 2023-12-04 ENCOUNTER — Emergency Department (HOSPITAL_COMMUNITY): Payer: Medicare HMO

## 2023-12-04 DIAGNOSIS — Z7902 Long term (current) use of antithrombotics/antiplatelets: Secondary | ICD-10-CM

## 2023-12-04 DIAGNOSIS — Z8249 Family history of ischemic heart disease and other diseases of the circulatory system: Secondary | ICD-10-CM

## 2023-12-04 DIAGNOSIS — W19XXXA Unspecified fall, initial encounter: Secondary | ICD-10-CM | POA: Insufficient documentation

## 2023-12-04 DIAGNOSIS — Z886 Allergy status to analgesic agent status: Secondary | ICD-10-CM

## 2023-12-04 DIAGNOSIS — I10 Essential (primary) hypertension: Secondary | ICD-10-CM | POA: Diagnosis present

## 2023-12-04 DIAGNOSIS — Z743 Need for continuous supervision: Secondary | ICD-10-CM | POA: Diagnosis not present

## 2023-12-04 DIAGNOSIS — N390 Urinary tract infection, site not specified: Secondary | ICD-10-CM | POA: Diagnosis present

## 2023-12-04 DIAGNOSIS — L89312 Pressure ulcer of right buttock, stage 2: Secondary | ICD-10-CM | POA: Diagnosis present

## 2023-12-04 DIAGNOSIS — D509 Iron deficiency anemia, unspecified: Secondary | ICD-10-CM | POA: Diagnosis present

## 2023-12-04 DIAGNOSIS — I129 Hypertensive chronic kidney disease with stage 1 through stage 4 chronic kidney disease, or unspecified chronic kidney disease: Secondary | ICD-10-CM | POA: Diagnosis present

## 2023-12-04 DIAGNOSIS — R9389 Abnormal findings on diagnostic imaging of other specified body structures: Secondary | ICD-10-CM | POA: Diagnosis not present

## 2023-12-04 DIAGNOSIS — I672 Cerebral atherosclerosis: Secondary | ICD-10-CM | POA: Diagnosis not present

## 2023-12-04 DIAGNOSIS — S0990XA Unspecified injury of head, initial encounter: Secondary | ICD-10-CM | POA: Insufficient documentation

## 2023-12-04 DIAGNOSIS — I959 Hypotension, unspecified: Secondary | ICD-10-CM | POA: Diagnosis not present

## 2023-12-04 DIAGNOSIS — Z8546 Personal history of malignant neoplasm of prostate: Secondary | ICD-10-CM

## 2023-12-04 DIAGNOSIS — M47812 Spondylosis without myelopathy or radiculopathy, cervical region: Secondary | ICD-10-CM | POA: Diagnosis not present

## 2023-12-04 DIAGNOSIS — R531 Weakness: Secondary | ICD-10-CM

## 2023-12-04 DIAGNOSIS — E8722 Chronic metabolic acidosis: Secondary | ICD-10-CM | POA: Diagnosis present

## 2023-12-04 DIAGNOSIS — Z79899 Other long term (current) drug therapy: Secondary | ICD-10-CM

## 2023-12-04 DIAGNOSIS — R911 Solitary pulmonary nodule: Secondary | ICD-10-CM | POA: Diagnosis not present

## 2023-12-04 DIAGNOSIS — N179 Acute kidney failure, unspecified: Secondary | ICD-10-CM | POA: Diagnosis not present

## 2023-12-04 DIAGNOSIS — Z888 Allergy status to other drugs, medicaments and biological substances status: Secondary | ICD-10-CM

## 2023-12-04 DIAGNOSIS — E860 Dehydration: Secondary | ICD-10-CM | POA: Diagnosis present

## 2023-12-04 DIAGNOSIS — M160 Bilateral primary osteoarthritis of hip: Secondary | ICD-10-CM | POA: Diagnosis not present

## 2023-12-04 DIAGNOSIS — D631 Anemia in chronic kidney disease: Secondary | ICD-10-CM | POA: Diagnosis present

## 2023-12-04 DIAGNOSIS — N400 Enlarged prostate without lower urinary tract symptoms: Secondary | ICD-10-CM | POA: Diagnosis present

## 2023-12-04 DIAGNOSIS — Z66 Do not resuscitate: Secondary | ICD-10-CM | POA: Diagnosis present

## 2023-12-04 DIAGNOSIS — Z043 Encounter for examination and observation following other accident: Secondary | ICD-10-CM | POA: Diagnosis not present

## 2023-12-04 DIAGNOSIS — Z7982 Long term (current) use of aspirin: Secondary | ICD-10-CM

## 2023-12-04 DIAGNOSIS — E8721 Acute metabolic acidosis: Secondary | ICD-10-CM | POA: Diagnosis present

## 2023-12-04 DIAGNOSIS — M4802 Spinal stenosis, cervical region: Secondary | ICD-10-CM | POA: Diagnosis not present

## 2023-12-04 DIAGNOSIS — R296 Repeated falls: Secondary | ICD-10-CM | POA: Diagnosis present

## 2023-12-04 DIAGNOSIS — F0390 Unspecified dementia without behavioral disturbance: Secondary | ICD-10-CM | POA: Diagnosis present

## 2023-12-04 DIAGNOSIS — G8929 Other chronic pain: Secondary | ICD-10-CM | POA: Diagnosis present

## 2023-12-04 DIAGNOSIS — Z87891 Personal history of nicotine dependence: Secondary | ICD-10-CM

## 2023-12-04 DIAGNOSIS — S199XXA Unspecified injury of neck, initial encounter: Secondary | ICD-10-CM | POA: Diagnosis not present

## 2023-12-04 DIAGNOSIS — L8990 Pressure ulcer of unspecified site, unspecified stage: Secondary | ICD-10-CM | POA: Diagnosis present

## 2023-12-04 DIAGNOSIS — B964 Proteus (mirabilis) (morganii) as the cause of diseases classified elsewhere: Secondary | ICD-10-CM | POA: Diagnosis present

## 2023-12-04 DIAGNOSIS — G9389 Other specified disorders of brain: Secondary | ICD-10-CM | POA: Diagnosis not present

## 2023-12-04 DIAGNOSIS — R0902 Hypoxemia: Secondary | ICD-10-CM | POA: Diagnosis not present

## 2023-12-04 DIAGNOSIS — E875 Hyperkalemia: Secondary | ICD-10-CM | POA: Diagnosis present

## 2023-12-04 DIAGNOSIS — R0989 Other specified symptoms and signs involving the circulatory and respiratory systems: Secondary | ICD-10-CM | POA: Diagnosis not present

## 2023-12-04 DIAGNOSIS — Z882 Allergy status to sulfonamides status: Secondary | ICD-10-CM

## 2023-12-04 DIAGNOSIS — N1832 Chronic kidney disease, stage 3b: Secondary | ICD-10-CM | POA: Diagnosis present

## 2023-12-04 DIAGNOSIS — Z87442 Personal history of urinary calculi: Secondary | ICD-10-CM

## 2023-12-04 DIAGNOSIS — E785 Hyperlipidemia, unspecified: Secondary | ICD-10-CM | POA: Diagnosis present

## 2023-12-04 DIAGNOSIS — M549 Dorsalgia, unspecified: Secondary | ICD-10-CM | POA: Diagnosis present

## 2023-12-04 DIAGNOSIS — L89892 Pressure ulcer of other site, stage 2: Secondary | ICD-10-CM | POA: Diagnosis present

## 2023-12-04 DIAGNOSIS — E538 Deficiency of other specified B group vitamins: Secondary | ICD-10-CM | POA: Diagnosis present

## 2023-12-04 DIAGNOSIS — S022XXA Fracture of nasal bones, initial encounter for closed fracture: Secondary | ICD-10-CM | POA: Diagnosis not present

## 2023-12-04 DIAGNOSIS — R001 Bradycardia, unspecified: Secondary | ICD-10-CM | POA: Diagnosis not present

## 2023-12-04 DIAGNOSIS — I6782 Cerebral ischemia: Secondary | ICD-10-CM | POA: Diagnosis not present

## 2023-12-04 DIAGNOSIS — I739 Peripheral vascular disease, unspecified: Secondary | ICD-10-CM | POA: Diagnosis present

## 2023-12-04 DIAGNOSIS — G9341 Metabolic encephalopathy: Secondary | ICD-10-CM | POA: Diagnosis not present

## 2023-12-04 LAB — CBC
HCT: 25.9 % — ABNORMAL LOW (ref 39.0–52.0)
Hemoglobin: 8.2 g/dL — ABNORMAL LOW (ref 13.0–17.0)
MCH: 28.6 pg (ref 26.0–34.0)
MCHC: 31.7 g/dL (ref 30.0–36.0)
MCV: 90.2 fL (ref 80.0–100.0)
Platelets: 342 10*3/uL (ref 150–400)
RBC: 2.87 MIL/uL — ABNORMAL LOW (ref 4.22–5.81)
RDW: 17.4 % — ABNORMAL HIGH (ref 11.5–15.5)
WBC: 9.4 10*3/uL (ref 4.0–10.5)
nRBC: 0 % (ref 0.0–0.2)

## 2023-12-04 LAB — CBC WITH DIFFERENTIAL/PLATELET
Abs Immature Granulocytes: 0.03 10*3/uL (ref 0.00–0.07)
Basophils Absolute: 0.1 10*3/uL (ref 0.0–0.1)
Basophils Relative: 1 %
Eosinophils Absolute: 0.3 10*3/uL (ref 0.0–0.5)
Eosinophils Relative: 3 %
HCT: 27.3 % — ABNORMAL LOW (ref 39.0–52.0)
Hemoglobin: 8.3 g/dL — ABNORMAL LOW (ref 13.0–17.0)
Immature Granulocytes: 0 %
Lymphocytes Relative: 13 %
Lymphs Abs: 1.2 10*3/uL (ref 0.7–4.0)
MCH: 27.9 pg (ref 26.0–34.0)
MCHC: 30.4 g/dL (ref 30.0–36.0)
MCV: 91.6 fL (ref 80.0–100.0)
Monocytes Absolute: 0.9 10*3/uL (ref 0.1–1.0)
Monocytes Relative: 10 %
Neutro Abs: 6.4 10*3/uL (ref 1.7–7.7)
Neutrophils Relative %: 73 %
Platelets: 285 10*3/uL (ref 150–400)
RBC: 2.98 MIL/uL — ABNORMAL LOW (ref 4.22–5.81)
RDW: 17.5 % — ABNORMAL HIGH (ref 11.5–15.5)
WBC: 8.9 10*3/uL (ref 4.0–10.5)
nRBC: 0 % (ref 0.0–0.2)

## 2023-12-04 LAB — BASIC METABOLIC PANEL
Anion gap: 11 (ref 5–15)
Anion gap: 10 (ref 5–15)
BUN: 73 mg/dL — ABNORMAL HIGH (ref 8–23)
BUN: 75 mg/dL — ABNORMAL HIGH (ref 8–23)
CO2: 18 mmol/L — ABNORMAL LOW (ref 22–32)
CO2: 14 mmol/L — ABNORMAL LOW (ref 22–32)
Calcium: 8.8 mg/dL — ABNORMAL LOW (ref 8.9–10.3)
Calcium: 9.2 mg/dL (ref 8.9–10.3)
Chloride: 113 mmol/L — ABNORMAL HIGH (ref 98–111)
Chloride: 115 mmol/L — ABNORMAL HIGH (ref 98–111)
Creatinine, Ser: 2.51 mg/dL — ABNORMAL HIGH (ref 0.61–1.24)
Creatinine, Ser: 2.45 mg/dL — ABNORMAL HIGH (ref 0.61–1.24)
GFR, Estimated: 23 mL/min — ABNORMAL LOW (ref >60)
GFR, Estimated: 24 mL/min — ABNORMAL LOW (ref >60)
Glucose, Bld: 111 mg/dL — ABNORMAL HIGH (ref 70–99)
Glucose, Bld: 118 mg/dL — ABNORMAL HIGH (ref 70–99)
Potassium: 4.7 mmol/L (ref 3.5–5.1)
Potassium: 5.5 mmol/L — ABNORMAL HIGH (ref 3.5–5.1)
Sodium: 142 mmol/L (ref 135–145)
Sodium: 139 mmol/L (ref 135–145)

## 2023-12-04 LAB — CBG MONITORING, ED: Glucose-Capillary: 85 mg/dL (ref 70–99)

## 2023-12-04 LAB — CK: Total CK: 252 U/L (ref 49–397)

## 2023-12-04 MED ORDER — SODIUM CHLORIDE 0.9 % IV BOLUS (SEPSIS)
1000.0000 mL | Freq: Once | INTRAVENOUS | Status: AC
  Administered 2023-12-05: 1000 mL via INTRAVENOUS

## 2023-12-04 NOTE — Progress Notes (Signed)
   12/04/23 2325  Spiritual Encounters  Type of Visit Initial  Care provided to: Patient  Conversation partners present during encounter Nurse  Reason for visit Routine spiritual support  OnCall Visit Yes    Responded to page, fall on thinner. Patient alert however has difficult hearing. Medical team entered and chaplain ceased assessment temporary due to immediate medical needs. Will follow-up.

## 2023-12-04 NOTE — ED Triage Notes (Signed)
Pt BIB [facility] from [facility], Pt had a fall this morning and went to [facility] this morning but presented today with the second fall due to patient hitting his head on a dresser. Patient is on thinners. Per EMS pt GCS was 15. Patient has a history of sundowning and known to have frequent falls.

## 2023-12-04 NOTE — ED Notes (Addendum)
Trauma Response Nurse Documentation   Jon Smith is a 89 y.o. male arriving to [facility] ED via EMS  On clopidogrel 75 mg daily. Trauma was activated as a Level 2 by ED charge RN based on the following trauma criteria Elderly patients > 65 with head trauma on anti-coagulation (excluding ASA).  Patient cleared for CT by Dr. Bebe Shaggy EDP. Pt transported to CT with trauma response nurse present to monitor. RN remained with the patient throughout their absence from the department for clinical observation.   GCS 15.  History   Past Medical History:  Diagnosis Date   Back pain    Elevated LFTs    Hepatitis    HLD (hyperlipidemia)    Hypertension    Incomplete bladder emptying    Lower extremity ulceration (HCC)    Nocturia    Peripheral arterial disease (HCC)    Prostate cancer (HCC) remote   Cryotherapy by Dr. Orson Slick.  PSA 5.54 in September at [facility]   Renal stones      Past Surgical History:  Procedure Laterality Date   AORTOGRAM     PERITONEAL CATHETER INSERTION     PROSTATE SURGERY     TRANSLUMINAL ANGIOPLASTY         Initial Focused Assessment (If applicable, or please see trauma documentation): Alert male presents via EMS from SNF for fall with head injury this evening. Of note, second visit to ED today for fall.   Airway patent/unobstructed, BS clear No obvious uncontrolled hemorrhage GCS 15 PERRLA 2  CT's Completed:   CT Head and CT C-Spine   Interventions:  Trauma lab draw Portable Chest XRAY CT head and cervical spine [place] J collar (No EMS collar) EKG  Plan for disposition:  Admit  Consults completed:  Hospitalist   Event Summary: Fall from SNF with head injury. No obvious trauma noted. Hx of frequent falls, second visit today for falls. Trauma scans negative. Labs with worsening AKI. Admit.  MTP Summary (If applicable): NA  Bedside handoff with ED RN Darral Dash.    Javarius Tsosie O Purl Claytor  Trauma Response RN  Please call TRN at [phone] for further  assistance.

## 2023-12-04 NOTE — ED Provider Notes (Signed)
EMERGENCY DEPARTMENT AT [facility] Provider Note   CSN: [id number] Arrival date & time: 12/04/23  2301     History  Chief complaint-fall  Level 5 caveat due to altered mental status  Jon Smith is a 89 y.o. male.  The history is provided by the patient.  Patient with history of hypertension presents from assisted living facility after fall.  EMS reports patient had an unwitnessed fall striking the back of his head on a dresser.  Patient is currently on clopidogrel.  This is the patient's second fall in the past 24 hours.  He was already evaluated at [facility] earlier in the day    Past Medical History:  Diagnosis Date   Back pain    Elevated LFTs    Hepatitis    HLD (hyperlipidemia)    Hypertension    Incomplete bladder emptying    Lower extremity ulceration (HCC)    Nocturia    Peripheral arterial disease (HCC)    Prostate cancer (HCC) remote   Cryotherapy by Dr. Orson Slick.  PSA 5.54 in September at [facility]   Renal stones     Home Medications Prior to Admission medications   Medication Sig Start Date End Date Taking? Authorizing Provider  acetaminophen (TYLENOL) 325 MG tablet Take 650 mg by mouth every 6 (six) hours as needed for mild pain.    [provider]  alum & mag hydroxide-simeth (ANTACID LIQUID) 200-200-20 MG/5ML suspension Take by mouth 4 (four) times daily as needed for indigestion or heartburn.    [provider]  amoxicillin-clavulanate (AUGMENTIN) 875-125 MG tablet Take 1 tablet by mouth 2 (two) times daily. 11/10/23   [provider]  ASPIRIN LOW DOSE 81 MG EC tablet Take 81 mg by mouth daily. 03/26/21   [provider]  atorvastatin (LIPITOR) 40 MG tablet Take 40 mg by mouth daily. 10/12/20   [provider]  clopidogrel (PLAVIX) 75 MG tablet Take by mouth. 08/02/20   [provider]  cyanocobalamin 1000 MCG tablet Take 1 tablet (1,000 mcg total) by mouth daily. 11/30/23  12/30/23  Charise Killian, MD  ferrous sulfate 325 (65 FE) MG EC tablet Take 1 tablet by mouth in the morning and at bedtime. 02/26/21   [provider]  finasteride (PROSCAR) 5 MG tablet Take 1 tablet (5 mg total) by mouth daily. 04/29/23   Vanna Scotland, MD  latanoprost (XALATAN) 0.005 % ophthalmic solution Place 1 drop into both eyes at bedtime. 09/23/15   [provider]  lisinopril (ZESTRIL) 40 MG tablet Take 40 mg by mouth daily. 04/01/22   [provider]  loperamide (IMODIUM A-D) 2 MG tablet Take 2 mg by mouth 4 (four) times daily as needed for diarrhea or loose stools.    [provider]  magnesium hydroxide (MILK OF MAGNESIA) 400 MG/5ML suspension Take 30 mLs by mouth daily as needed for mild constipation.    [provider]  metoprolol tartrate (LOPRESSOR) 25 MG tablet Take 25 mg by mouth 2 (two) times daily. 10/12/20   [provider]  sertraline (ZOLOFT) 50 MG tablet Take 50 mg by mouth daily. 11/07/23   [provider]  tamsulosin (FLOMAX) 0.4 MG CAPS capsule Take 1 capsule (0.4 mg total) by mouth daily. At bedtime Patient not taking: Reported on 11/25/2023 04/29/23   Vanna Scotland, MD      Allergies    Aspirin and Sulfamethoxazole-trimethoprim    Review of Systems   Review of Systems  Unable to perform ROS: Mental status change    Physical Exam Updated Vital Signs BP (!) 118/46   Pulse 88   Temp (!) 96.7 F (35.9 C)   Resp 17   Ht 1.727 m (5\' 8" )   Wt 59.8 kg   SpO2 96%   BMI 20.05 kg/m  Physical Exam CONSTITUTIONAL: Elderly and frail HEAD: Hematoma noted to posterior scalp, no other signs of trauma EYES: EOMI/PERRL ENMT: Mucous membranes moist NECK: supple no meningeal signs SPINE/BACK:entire spine nontender No bruising/crepitance/stepoffs noted to spine CV: S1/S2 noted, no murmurs/rubs/gallops noted LUNGS: Lungs are clear to auscultation bilaterally, no apparent distress ABDOMEN: soft,  nontender NEURO: Pt is awake/alert but appears confused.  Moves all extremities x 4 EXTREMITIES: pulses normal/equal, full ROM, pelvis stable, All other extremities/joints palpated/ranged and nontender SKIN: warm, color normal   ED Results / Procedures / Treatments   Labs (all labs ordered are listed, but only abnormal results are displayed) Labs Reviewed  CBC WITH DIFFERENTIAL/PLATELET - Abnormal; Notable for the following components:      Result Value   RBC 2.98 (*)    Hemoglobin 8.3 (*)    HCT 27.3 (*)    RDW 17.5 (*)    All other components within normal limits  BASIC METABOLIC PANEL - Abnormal; Notable for the following components:   Potassium 5.5 (*)    Chloride 115 (*)    CO2 14 (*)    Glucose, Bld 118 (*)    BUN 75 (*)    Creatinine, Ser 2.45 (*)    GFR, Estimated 24 (*)    All other components within normal limits  CK  URINALYSIS, ROUTINE W REFLEX MICROSCOPIC    EKG EKG Interpretation Date/Time:  Thursday December 04 2023 23:05:34 EST Ventricular Rate:  83 PR Interval:  123 QRS Duration:  102 QT Interval:  354 QTC Calculation: 416 R Axis:   42  Text Interpretation: Sinus rhythm Atrial premature complex Abnormal R-wave progression, early transition Nonspecific repol abnormality, diffuse leads Interpretation limited secondary to artifact Confirmed by Zadie Rhine ([id number]) on 12/04/2023 11:14:52 PM  Radiology CT CERVICAL SPINE WO CONTRAST  Result Date: 12/04/2023 CLINICAL DATA:  Status post fall. EXAM: CT CERVICAL SPINE WITHOUT CONTRAST TECHNIQUE: Multidetector CT imaging of the cervical spine was performed without intravenous contrast. Multiplanar CT image reconstructions were also generated. RADIATION DOSE REDUCTION: This exam was performed according to the departmental dose-optimization program which includes automated exposure control, adjustment of the mA and/or kV according to patient size and/or use of iterative reconstruction technique. COMPARISON:  None  Available. FINDINGS: Alignment: Normal. Skull base and vertebrae: No acute fracture. No primary bone lesion or focal pathologic process. Soft tissues and spinal canal: No prevertebral fluid or swelling. No visible canal hematoma. Disc levels: Mild to moderate severity multilevel endplate sclerosis, moderate to marked severity anterior osteophyte formation and moderate to marked severity posterior bony spurring are seen throughout all levels of the cervical spine. There is marked severity narrowing of the anterior and lateral axial articulation. Moderate severity intervertebral disc space narrowing is seen at the levels of C4-C5, C5-C6 and C6-C7. Bilateral marked severity multilevel facet joint hypertrophy is noted. Upper chest: A 2.5 mm posterior left apical noncalcified lung nodule is seen (axial CT image 76, CT series 4). This is stable in size and appearance when compared to the patient's most recent chest CT (April 11, 2023). Other: None. IMPRESSION: 1. No acute fracture or subluxation of the cervical spine. 2. Moderate to marked severity multilevel  degenerative changes throughout the cervical spine. 3. 2.5 mm posterior left apical noncalcified lung nodule. Electronically Signed   By: Aram Candela M.D.   On: 12/04/2023 23:52   CT HEAD WO CONTRAST  Result Date: 12/04/2023 CLINICAL DATA:  Status post fall. EXAM: CT HEAD WITHOUT CONTRAST TECHNIQUE: Contiguous axial images were obtained from the base of the skull through the vertex without intravenous contrast. RADIATION DOSE REDUCTION: This exam was performed according to the departmental dose-optimization program which includes automated exposure control, adjustment of the mA and/or kV according to patient size and/or use of iterative reconstruction technique. COMPARISON:  December 04, 2023 (10:36 a.m.) FINDINGS: Brain: There is moderate severity cerebral atrophy with widening of the extra-axial spaces and ventricular dilatation. There are areas of  decreased attenuation within the white matter tracts of the supratentorial brain, consistent with microvascular disease changes. Chronic bilateral basal ganglia lacunar infarcts are noted. Vascular: No hyperdense vessel or unexpected calcification. Skull: A chronic right-sided nasal bone fracture is noted. Sinuses/Orbits: No acute finding. Other: None. IMPRESSION: 1. Generalized cerebral atrophy with chronic white matter small vessel ischemic changes. 2. No acute intracranial abnormality. Electronically Signed   By: Aram Candela M.D.   On: 12/04/2023 23:36   DG Chest Port 1 View  Result Date: 12/04/2023 CLINICAL DATA:  Status post fall. EXAM: PORTABLE CHEST 1 VIEW COMPARISON:  None Available. FINDINGS: The heart size and mediastinal contours are within normal limits. There is mild, stable elevation of the right hemidiaphragm. Both lungs are clear. The visualized skeletal structures are unremarkable. IMPRESSION: No active cardiopulmonary disease. Electronically Signed   By: Aram Candela M.D.   On: 12/04/2023 23:31   CT HEAD WO CONTRAST ( )  Result Date: 12/04/2023 CLINICAL DATA:  Head trauma, minor (Age >= 65y); Neck trauma (Age >= 65y). EXAM: CT HEAD WITHOUT CONTRAST CT CERVICAL SPINE WITHOUT CONTRAST TECHNIQUE: Multidetector CT imaging of the head and cervical spine was performed following the standard protocol without intravenous contrast. Multiplanar CT image reconstructions of the cervical spine were also generated. RADIATION DOSE REDUCTION: This exam was performed according to the departmental dose-optimization program which includes automated exposure control, adjustment of the mA and/or kV according to patient size and/or use of iterative reconstruction technique. COMPARISON:  CT scan head from 04/11/2023. FINDINGS: CT HEAD FINDINGS Brain: No evidence of acute infarction, hemorrhage, hydrocephalus, extra-axial collection or mass lesion/mass effect. There is bilateral periventricular  hypodensity, which is non-specific but most likely seen in the settings of microvascular ischemic changes. Moderate in extent. Otherwise normal appearance of brain parenchyma. Ventricles are normal. Cerebral volume is age appropriate. Vascular: No hyperdense vessel or unexpected calcification. Intracranial arteriosclerosis. Skull: Normal. Negative for fracture or focal lesion. Sinuses/Orbits: No acute finding. Other: Visualized mastoid air cells are unremarkable. No mastoid effusion. CT CERVICAL SPINE FINDINGS Alignment: Normal. This examination does not assess for ligamentous injury or stability. Skull base and vertebrae: No acute fracture. No primary bone lesion or focal pathologic process. Soft tissues and spinal canal: No prevertebral fluid or swelling. No visible canal hematoma. Disc levels: Mild-to-moderately reduced disc height of C4-5 through C7-T1 levels. Is mild multilevel facet arthropathy and moderate multilevel marginal osteophyte formation. Upper chest: Negative. Other: None. IMPRESSION: *No acute intracranial abnormality. *No acute osseous injury or traumatic listhesis of the cervical spine. Electronically Signed   By: Jules Schick M.D.   On: 12/04/2023 11:34   CT Cervical Spine Wo Contrast  Result Date: 12/04/2023 CLINICAL DATA:  Head trauma, minor (Age >= 65y); Neck trauma (  Age >= 65y). EXAM: CT HEAD WITHOUT CONTRAST CT CERVICAL SPINE WITHOUT CONTRAST TECHNIQUE: Multidetector CT imaging of the head and cervical spine was performed following the standard protocol without intravenous contrast. Multiplanar CT image reconstructions of the cervical spine were also generated. RADIATION DOSE REDUCTION: This exam was performed according to the departmental dose-optimization program which includes automated exposure control, adjustment of the mA and/or kV according to patient size and/or use of iterative reconstruction technique. COMPARISON:  CT scan head from 04/11/2023. FINDINGS: CT HEAD FINDINGS  Brain: No evidence of acute infarction, hemorrhage, hydrocephalus, extra-axial collection or mass lesion/mass effect. There is bilateral periventricular hypodensity, which is non-specific but most likely seen in the settings of microvascular ischemic changes. Moderate in extent. Otherwise normal appearance of brain parenchyma. Ventricles are normal. Cerebral volume is age appropriate. Vascular: No hyperdense vessel or unexpected calcification. Intracranial arteriosclerosis. Skull: Normal. Negative for fracture or focal lesion. Sinuses/Orbits: No acute finding. Other: Visualized mastoid air cells are unremarkable. No mastoid effusion. CT CERVICAL SPINE FINDINGS Alignment: Normal. This examination does not assess for ligamentous injury or stability. Skull base and vertebrae: No acute fracture. No primary bone lesion or focal pathologic process. Soft tissues and spinal canal: No prevertebral fluid or swelling. No visible canal hematoma. Disc levels: Mild-to-moderately reduced disc height of C4-5 through C7-T1 levels. Is mild multilevel facet arthropathy and moderate multilevel marginal osteophyte formation. Upper chest: Negative. Other: None. IMPRESSION: *No acute intracranial abnormality. *No acute osseous injury or traumatic listhesis of the cervical spine. Electronically Signed   By: Jules Schick M.D.   On: 12/04/2023 11:34   DG Pelvis 1-2 Views  Result Date: 12/04/2023 CLINICAL DATA:  Status post fall from standing. EXAM: PELVIS - 1-2 VIEW COMPARISON:  10/01/2012 FINDINGS: No evidence for an acute fracture. SI joints and symphysis pubis unremarkable. Degenerative changes are noted in the hips, left greater than right. Heterotopic ossification in the region of the lesser trochanter of the left femoral neck is compatible with chronic/repetitive soft tissue injury. Evidence of previous ventral mesh placement noted over the lower pelvis. IMPRESSION: 1. No acute bony findings. 2. Degenerative changes in the hips,  left greater than right. Electronically Signed   By: Kennith Center M.D.   On: 12/04/2023 11:26   DG Chest 1 View  Result Date: 12/04/2023 CLINICAL DATA:  fall. EXAM: CHEST  1 VIEW COMPARISON:  04/11/2023. FINDINGS: Low lung volume. Bilateral lung fields are clear. Note is made of elevated right hemidiaphragm. Bilateral costophrenic angles are clear. Stable cardio-mediastinal silhouette. No acute osseous abnormalities. The soft tissues are within normal limits. IMPRESSION: No active disease. Electronically Signed   By: Jules Schick M.D.   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  He just had an evaluation at [facility] I called the facility and they confirmed the story that patient fell striking the back of his head.  They report they are looking to place him in a higher level of care [DW]  2315 Will obtain trauma imaging as well as expand laboratory evaluation due to increased falls [DW]  2320 Hemoglobin(!): 8.3 Chronic anemia [DW]  Fri Dec 05, 2023  0011 Patient stable, no acute traumatic injuries.  However patient does have worsening renal failure over the past month and now mild hyperkalemia.  IV fluids been ordered.  This could be contribute to his frequent falls.  Discussed the case with his son via phone [name] T) Patient will be admitted  [DW]  0045 Discussed with Dr. Maisie Fus for admission [DW]    Clinical Course User Index [DW] Zadie Rhine, MD                                 Medical Decision Making Amount and/or Complexity of Data Reviewed Labs: ordered. Decision-making details documented in ED  Course. Radiology: ordered. ECG/medicine tests: ordered.  Risk Decision regarding hospitalization.   This patient presents to the ED for concern of fall with head injury, this involves an extensive number of treatment options, and is a complaint that carries with it a high risk of complications and morbidity.  The differential diagnosis includes but is not limited to skull fracture, subdural hematoma, subarachnoid hemorrhage, concussion  Comorbidities that complicate the patient evaluation: Patient's presentation is complicated by their history of hypertension  Social Determinants of Health: Patient's  poor mobility   increases the complexity of managing their presentation  Additional history obtained: Additional history obtained from EMS discussed with paramedic at the bedside, discussed with his son, I also called the nursing home Records reviewed previous admission documents  Lab Tests: I Ordered, and personally interpreted labs.  The pertinent results include: Acute kidney injury, dehydration, mild hyperkalemia  Imaging Studies ordered: I ordered imaging studies including CT scan head and C-spine and X-ray chest   I independently visualized and interpreted imaging which showed no acute traumatic injuries I agree with the radiologist interpretation  Cardiac Monitoring: The patient was maintained on a cardiac monitor.  I personally viewed and interpreted the cardiac monitor which showed an underlying rhythm of:  sinus rhythm  Medicines ordered and prescription drug management: I ordered medication including saline for dehydration Reevaluation of the patient after these medicines showed that the patient    stayed the same   Critical Interventions:   IV fluids, admission  Consultations Obtained: I requested consultation with the admitting physician [facility] , and discussed  findings as well as pertinent plan - they recommend: Admit  Reevaluation: After the interventions noted  above, I reevaluated the patient and found that they have :stayed the same  Complexity of problems addressed: Patient's presentation is most consistent with  acute presentation with potential threat to life or bodily function  Disposition: After consideration of the diagnostic results and the patient's response to treatment,  I feel that the patent would benefit from admission   .           Final Clinical Impression(s) / ED Diagnoses Final diagnoses:  AKI (acute kidney injury) (HCC)  Dehydration  Lung nodule    Rx / DC Orders ED Discharge Orders     None         Zadie Rhine, MD 12/05/23 [phone]

## 2023-12-04 NOTE — ED Provider Notes (Signed)
[facility] Provider Note    Event Date/Time   First MD Initiated Contact with Patient 12/04/23 1008     (approximate)   History   Fall  Level V Caveat:  poor historian HPI  Jon Smith is a 89 y.o. male is a total assist presenting from facility for reported multiple falls.  Patient denies any complaints but is a very poor historian.  No additional history provided by staff or EMS.  Unclear per report and from facility staff whether this is acute change in mentation from baseline.     Physical Exam   Triage Vital Signs: ED Triage Vitals  Encounter Vitals Group     BP 12/04/23 0933 122/65     Systolic BP Percentile --      Diastolic BP Percentile --      Pulse Rate 12/04/23 0933 74     Resp 12/04/23 0933 18     Temp 12/04/23 0933 97.7 F (36.5 C)     Temp Source 12/04/23 0933 Oral     SpO2 12/04/23 0933 100 %     Weight 12/04/23 0934 131 lb 13.4 oz (59.8 kg)     Height 12/04/23 0934 5\' 8"  (1.727 m)     Head Circumference --      Peak Flow --      Pain Score 12/04/23 0934 0     Pain Loc --      Pain Education --      Exclude from Growth Chart --     Most recent vital signs: Vitals:   12/04/23 0933  BP: 122/65  Pulse: 74  Resp: 18  Temp: 97.7 F (36.5 C)  SpO2: 100%     Constitutional: Alert  Eyes: Conjunctivae are normal.  Head: Atraumatic. Nose: No congestion/rhinnorhea. Mouth/Throat: Mucous membranes are moist.   Neck: Painless ROM.  Cardiovascular:   Good peripheral circulation. Respiratory: Normal respiratory effort.  No retractions.  Gastrointestinal: Soft and nontender.  Musculoskeletal:  no deformity  Neurologic:  MAE spontaneously. No gross focal neurologic deficits are appreciated.  Skin:  Skin is warm, dry and intact. No rash noted. Psychiatric: Mood and affect are normal. Speech and behavior are normal.    ED Results / Procedures / Treatments   Labs (all labs ordered are listed, but only abnormal results  are displayed) Labs Reviewed  BASIC METABOLIC PANEL - Abnormal; Notable for the following components:      Result Value   Chloride 113 (*)    CO2 18 (*)    Glucose, Bld 111 (*)    BUN 73 (*)    Creatinine, Ser 2.51 (*)    Calcium 8.8 (*)    GFR, Estimated 23 (*)    All other components within normal limits  CBC - Abnormal; Notable for the following components:   RBC 2.87 (*)    Hemoglobin 8.2 (*)    HCT 25.9 (*)    RDW 17.4 (*)    All other components within normal limits  URINALYSIS, ROUTINE W REFLEX MICROSCOPIC  CBG MONITORING, ED     EKG  ED ECG REPORT I, Willy Eddy, the attending physician, personally viewed and interpreted this ECG.   Date: 12/04/2023  EKG Time: 9:44  Rate: 75  Rhythm: sinus  Axis: normal  Intervals: normal  ST&T Change: no stemi    RADIOLOGY Please see ED Course for my review and interpretation.  I personally reviewed all radiographic images ordered to evaluate for the above acute complaints  and reviewed radiology reports and findings.  These findings were personally discussed with the patient.  Please see medical record for radiology report.    PROCEDURES:  Critical Care performed:   Procedures   MEDICATIONS ORDERED IN ED: Medications - No data to display   IMPRESSION / MDM / ASSESSMENT AND PLAN / ED COURSE  I reviewed the triage vital signs and the nursing notes.                              Differential diagnosis includes, but is not limited to, fracture, electrolyte abnormality, anemia, SDH, IPH, contusion  Patient presenting to the ER for evaluation of symptoms as described above.  Based on symptoms, risk factors and considered above differential, this presenting complaint could reflect a potentially life-threatening illness therefore the patient will be placed on continuous pulse oximetry and telemetry for monitoring.  Laboratory evaluation will be sent to evaluate for the above complaints.  Patient without any specific  complaints.  Somewhat poor historian however.  He is a full assist at baseline.  No report of any acute change from baseline.  Will order x-rays and imaging to evaluate for fracture or injury though the patient denies any pain on exam.  Clinical Course as of 12/04/23 1327  Thu Dec 04, 2023  1046 CT head on my review and interpretation without evidence of SDH or IPH will await formal radiology report. [PR]  1317 Blood work is at baseline.  Imaging without evidence of traumatic injury.  Patient apparently at baseline does appear appropriate for discharge back to facility. [PR]    Clinical Course User Index [PR] Willy Eddy, MD     FINAL CLINICAL IMPRESSION(S) / ED DIAGNOSES   Final diagnoses:  Fall, initial encounter     Rx / DC Orders   ED Discharge Orders     None        Note:  This document was prepared using Dragon voice recognition software and may include unintentional dictation errors.    Willy Eddy, MD 12/04/23 [phone]

## 2023-12-04 NOTE — ED Notes (Signed)
Attempted to call report to the [facility]- was hung up on by facility. Facility was notified that the pt will be returning today.

## 2023-12-04 NOTE — ED Notes (Signed)
Lab seen drawing blood at this time.

## 2023-12-04 NOTE — ED Triage Notes (Signed)
Pt from the [facility] [place] via ems, reports from staff that pt has fallen 3 x in one hour. Pt denies injuries or complaints. A/O x 3- baseline.   132/45 78HR [id number]

## 2023-12-05 ENCOUNTER — Inpatient Hospital Stay (HOSPITAL_COMMUNITY): Payer: Medicare HMO

## 2023-12-05 ENCOUNTER — Encounter (HOSPITAL_COMMUNITY): Payer: Self-pay | Admitting: Internal Medicine

## 2023-12-05 DIAGNOSIS — L89892 Pressure ulcer of other site, stage 2: Secondary | ICD-10-CM | POA: Diagnosis not present

## 2023-12-05 DIAGNOSIS — R5381 Other malaise: Secondary | ICD-10-CM

## 2023-12-05 DIAGNOSIS — N390 Urinary tract infection, site not specified: Secondary | ICD-10-CM | POA: Diagnosis not present

## 2023-12-05 DIAGNOSIS — F0390 Unspecified dementia without behavioral disturbance: Secondary | ICD-10-CM | POA: Diagnosis present

## 2023-12-05 DIAGNOSIS — N179 Acute kidney failure, unspecified: Principal | ICD-10-CM | POA: Diagnosis present

## 2023-12-05 DIAGNOSIS — I739 Peripheral vascular disease, unspecified: Secondary | ICD-10-CM | POA: Diagnosis present

## 2023-12-05 DIAGNOSIS — Z7189 Other specified counseling: Secondary | ICD-10-CM | POA: Diagnosis not present

## 2023-12-05 DIAGNOSIS — N1832 Chronic kidney disease, stage 3b: Secondary | ICD-10-CM | POA: Diagnosis present

## 2023-12-05 DIAGNOSIS — F331 Major depressive disorder, recurrent, moderate: Secondary | ICD-10-CM | POA: Diagnosis not present

## 2023-12-05 DIAGNOSIS — Z66 Do not resuscitate: Secondary | ICD-10-CM | POA: Diagnosis not present

## 2023-12-05 DIAGNOSIS — M6281 Muscle weakness (generalized): Secondary | ICD-10-CM | POA: Diagnosis not present

## 2023-12-05 DIAGNOSIS — N3000 Acute cystitis without hematuria: Secondary | ICD-10-CM | POA: Diagnosis not present

## 2023-12-05 DIAGNOSIS — E875 Hyperkalemia: Secondary | ICD-10-CM | POA: Diagnosis not present

## 2023-12-05 DIAGNOSIS — N19 Unspecified kidney failure: Secondary | ICD-10-CM | POA: Diagnosis not present

## 2023-12-05 DIAGNOSIS — Z79899 Other long term (current) drug therapy: Secondary | ICD-10-CM | POA: Diagnosis not present

## 2023-12-05 DIAGNOSIS — D508 Other iron deficiency anemias: Secondary | ICD-10-CM | POA: Diagnosis not present

## 2023-12-05 DIAGNOSIS — L89312 Pressure ulcer of right buttock, stage 2: Secondary | ICD-10-CM | POA: Diagnosis not present

## 2023-12-05 DIAGNOSIS — B964 Proteus (mirabilis) (morganii) as the cause of diseases classified elsewhere: Secondary | ICD-10-CM | POA: Diagnosis not present

## 2023-12-05 DIAGNOSIS — R1312 Dysphagia, oropharyngeal phase: Secondary | ICD-10-CM | POA: Diagnosis not present

## 2023-12-05 DIAGNOSIS — E785 Hyperlipidemia, unspecified: Secondary | ICD-10-CM | POA: Diagnosis not present

## 2023-12-05 DIAGNOSIS — Z515 Encounter for palliative care: Secondary | ICD-10-CM | POA: Diagnosis not present

## 2023-12-05 DIAGNOSIS — R058 Other specified cough: Secondary | ICD-10-CM | POA: Diagnosis not present

## 2023-12-05 DIAGNOSIS — G8929 Other chronic pain: Secondary | ICD-10-CM | POA: Diagnosis not present

## 2023-12-05 DIAGNOSIS — R488 Other symbolic dysfunctions: Secondary | ICD-10-CM | POA: Diagnosis not present

## 2023-12-05 DIAGNOSIS — F0150 Vascular dementia without behavioral disturbance: Secondary | ICD-10-CM | POA: Diagnosis not present

## 2023-12-05 DIAGNOSIS — I129 Hypertensive chronic kidney disease with stage 1 through stage 4 chronic kidney disease, or unspecified chronic kidney disease: Secondary | ICD-10-CM | POA: Diagnosis not present

## 2023-12-05 DIAGNOSIS — R296 Repeated falls: Secondary | ICD-10-CM | POA: Diagnosis not present

## 2023-12-05 DIAGNOSIS — D509 Iron deficiency anemia, unspecified: Secondary | ICD-10-CM | POA: Diagnosis present

## 2023-12-05 DIAGNOSIS — R911 Solitary pulmonary nodule: Secondary | ICD-10-CM | POA: Diagnosis not present

## 2023-12-05 DIAGNOSIS — L8990 Pressure ulcer of unspecified site, unspecified stage: Secondary | ICD-10-CM | POA: Diagnosis present

## 2023-12-05 DIAGNOSIS — Z87891 Personal history of nicotine dependence: Secondary | ICD-10-CM | POA: Diagnosis not present

## 2023-12-05 DIAGNOSIS — Z8546 Personal history of malignant neoplasm of prostate: Secondary | ICD-10-CM | POA: Diagnosis not present

## 2023-12-05 DIAGNOSIS — E8722 Chronic metabolic acidosis: Secondary | ICD-10-CM | POA: Diagnosis not present

## 2023-12-05 DIAGNOSIS — I10 Essential (primary) hypertension: Secondary | ICD-10-CM | POA: Diagnosis not present

## 2023-12-05 DIAGNOSIS — W19XXXA Unspecified fall, initial encounter: Secondary | ICD-10-CM | POA: Diagnosis present

## 2023-12-05 DIAGNOSIS — D631 Anemia in chronic kidney disease: Secondary | ICD-10-CM | POA: Diagnosis not present

## 2023-12-05 DIAGNOSIS — N400 Enlarged prostate without lower urinary tract symptoms: Secondary | ICD-10-CM | POA: Diagnosis not present

## 2023-12-05 DIAGNOSIS — I2510 Atherosclerotic heart disease of native coronary artery without angina pectoris: Secondary | ICD-10-CM | POA: Diagnosis not present

## 2023-12-05 DIAGNOSIS — E538 Deficiency of other specified B group vitamins: Secondary | ICD-10-CM | POA: Diagnosis not present

## 2023-12-05 DIAGNOSIS — N281 Cyst of kidney, acquired: Secondary | ICD-10-CM | POA: Diagnosis not present

## 2023-12-05 DIAGNOSIS — E860 Dehydration: Secondary | ICD-10-CM | POA: Diagnosis not present

## 2023-12-05 DIAGNOSIS — M6259 Muscle wasting and atrophy, not elsewhere classified, multiple sites: Secondary | ICD-10-CM | POA: Diagnosis not present

## 2023-12-05 DIAGNOSIS — G9341 Metabolic encephalopathy: Secondary | ICD-10-CM | POA: Diagnosis not present

## 2023-12-05 DIAGNOSIS — N182 Chronic kidney disease, stage 2 (mild): Secondary | ICD-10-CM

## 2023-12-05 DIAGNOSIS — E8721 Acute metabolic acidosis: Secondary | ICD-10-CM | POA: Diagnosis not present

## 2023-12-05 DIAGNOSIS — R531 Weakness: Secondary | ICD-10-CM | POA: Diagnosis not present

## 2023-12-05 DIAGNOSIS — Z7401 Bed confinement status: Secondary | ICD-10-CM | POA: Diagnosis not present

## 2023-12-05 LAB — CBC
HCT: 23.3 % — ABNORMAL LOW (ref 39.0–52.0)
HCT: 23.6 % — ABNORMAL LOW (ref 39.0–52.0)
Hemoglobin: 7.2 g/dL — ABNORMAL LOW (ref 13.0–17.0)
Hemoglobin: 7.2 g/dL — ABNORMAL LOW (ref 13.0–17.0)
MCH: 28.7 pg (ref 26.0–34.0)
MCH: 27.7 pg (ref 26.0–34.0)
MCHC: 30.9 g/dL (ref 30.0–36.0)
MCHC: 30.5 g/dL (ref 30.0–36.0)
MCV: 92.8 fL (ref 80.0–100.0)
MCV: 90.8 fL (ref 80.0–100.0)
Platelets: 299 10*3/uL (ref 150–400)
Platelets: 310 10*3/uL (ref 150–400)
RBC: 2.51 MIL/uL — ABNORMAL LOW (ref 4.22–5.81)
RBC: 2.6 MIL/uL — ABNORMAL LOW (ref 4.22–5.81)
RDW: 17.6 % — ABNORMAL HIGH (ref 11.5–15.5)
RDW: 17.2 % — ABNORMAL HIGH (ref 11.5–15.5)
WBC: 8.3 10*3/uL (ref 4.0–10.5)
WBC: 8.1 10*3/uL (ref 4.0–10.5)
nRBC: 0 % (ref 0.0–0.2)
nRBC: 0 % (ref 0.0–0.2)

## 2023-12-05 LAB — BLOOD GAS, VENOUS
Acid-base deficit: 9.8 mmol/L — ABNORMAL HIGH (ref 0.0–2.0)
Bicarbonate: 16 mmol/L — ABNORMAL LOW (ref 20.0–28.0)
Drawn by: 71172
O2 Saturation: 45.8 %
Patient temperature: 36.7
pCO2, Ven: 34 mm[Hg] — ABNORMAL LOW (ref 44–60)
pH, Ven: 7.28 (ref 7.25–7.43)
pO2, Ven: <31 mm[Hg] — CL (ref 32–45)

## 2023-12-05 LAB — COMPREHENSIVE METABOLIC PANEL
ALT: 21 U/L (ref 0–44)
AST: 16 U/L (ref 15–41)
Albumin: 2.1 g/dL — ABNORMAL LOW (ref 3.5–5.0)
Alkaline Phosphatase: 39 U/L (ref 38–126)
Anion gap: 6 (ref 5–15)
BUN: 58 mg/dL — ABNORMAL HIGH (ref 8–23)
CO2: 12 mmol/L — ABNORMAL LOW (ref 22–32)
Calcium: 6.9 mg/dL — ABNORMAL LOW (ref 8.9–10.3)
Chloride: 123 mmol/L — ABNORMAL HIGH (ref 98–111)
Creatinine, Ser: 1.84 mg/dL — ABNORMAL HIGH (ref 0.61–1.24)
GFR, Estimated: 34 mL/min — ABNORMAL LOW (ref >60)
Glucose, Bld: 86 mg/dL (ref 70–99)
Potassium: 3.8 mmol/L (ref 3.5–5.1)
Sodium: 141 mmol/L (ref 135–145)
Total Bilirubin: 0.3 mg/dL (ref <1.2)
Total Protein: 4.6 g/dL — ABNORMAL LOW (ref 6.5–8.1)

## 2023-12-05 LAB — TSH: TSH: 1.598 u[IU]/mL (ref 0.350–4.500)

## 2023-12-05 LAB — BASIC METABOLIC PANEL
Anion gap: 6 (ref 5–15)
BUN: 66 mg/dL — ABNORMAL HIGH (ref 8–23)
CO2: 16 mmol/L — ABNORMAL LOW (ref 22–32)
Calcium: 8.1 mg/dL — ABNORMAL LOW (ref 8.9–10.3)
Chloride: 116 mmol/L — ABNORMAL HIGH (ref 98–111)
Creatinine, Ser: 2.13 mg/dL — ABNORMAL HIGH (ref 0.61–1.24)
GFR, Estimated: 28 mL/min — ABNORMAL LOW (ref >60)
Glucose, Bld: 100 mg/dL — ABNORMAL HIGH (ref 70–99)
Potassium: 4.4 mmol/L (ref 3.5–5.1)
Sodium: 138 mmol/L (ref 135–145)

## 2023-12-05 LAB — RETICULOCYTES
Immature Retic Fract: 17.5 % — ABNORMAL HIGH (ref 2.3–15.9)
RBC.: 2.55 MIL/uL — ABNORMAL LOW (ref 4.22–5.81)
Retic Count, Absolute: 36.2 10*3/uL (ref 19.0–186.0)
Retic Ct Pct: 1.4 % (ref 0.4–3.1)

## 2023-12-05 LAB — IRON AND TIBC
Iron: 16 ug/dL — ABNORMAL LOW (ref 45–182)
Saturation Ratios: 6 % — ABNORMAL LOW (ref 17.9–39.5)
TIBC: 286 ug/dL (ref 250–450)
UIBC: 270 ug/dL

## 2023-12-05 LAB — PREPARE RBC (CROSSMATCH)

## 2023-12-05 LAB — VITAMIN B12: Vitamin B-12: 1028 pg/mL — ABNORMAL HIGH (ref 180–914)

## 2023-12-05 LAB — HEMOGLOBIN A1C
Hgb A1c MFr Bld: 5.4 % (ref 4.8–5.6)
Mean Plasma Glucose: 108.28 mg/dL

## 2023-12-05 LAB — FERRITIN: Ferritin: 27 ng/mL (ref 24–336)

## 2023-12-05 LAB — FOLATE: Folate: 5.7 ng/mL — ABNORMAL LOW (ref >5.9)

## 2023-12-05 MED ORDER — SODIUM CHLORIDE 0.9% IV SOLUTION: Freq: Once | INTRAVENOUS | Status: AC

## 2023-12-05 MED ORDER — FINASTERIDE 5 MG PO TABS
5.0000 mg | ORAL_TABLET | Freq: Every day | ORAL | Status: DC
Start: 2023-12-05 — End: 2023-12-12
  Administered 2023-12-05 – 2023-12-12 (×8): 5 mg via ORAL
  Filled 2023-12-05 (×8): qty 1

## 2023-12-05 MED ORDER — IRON SUCROSE 20 MG/ML IV SOLN
200.0000 mg | Freq: Once | INTRAVENOUS | Status: AC
  Administered 2023-12-05: 200 mg via INTRAVENOUS
  Filled 2023-12-05: qty 10

## 2023-12-05 MED ORDER — ALBUTEROL SULFATE (2.5 MG/3ML) 0.083% IN NEBU: 2.5000 mg | INHALATION_SOLUTION | RESPIRATORY_TRACT | Status: DC | PRN

## 2023-12-05 MED ORDER — TAMSULOSIN HCL 0.4 MG PO CAPS
0.4000 mg | ORAL_CAPSULE | Freq: Every day | ORAL | Status: DC
Start: 2023-12-05 — End: 2023-12-12
  Administered 2023-12-05 – 2023-12-12 (×8): 0.4 mg via ORAL
  Filled 2023-12-05 (×8): qty 1

## 2023-12-05 MED ORDER — SODIUM CHLORIDE 0.9 % IV SOLN: INTRAVENOUS | Status: AC

## 2023-12-05 MED ORDER — METOPROLOL TARTRATE 25 MG PO TABS: 25.0000 mg | ORAL_TABLET | Freq: Two times a day (BID) | ORAL | Status: DC

## 2023-12-05 MED ORDER — ACETAMINOPHEN 325 MG PO TABS
650.0000 mg | ORAL_TABLET | Freq: Four times a day (QID) | ORAL | Status: DC | PRN
  Administered 2023-12-08 – 2023-12-11 (×4): 650 mg via ORAL
  Filled 2023-12-05 (×4): qty 2

## 2023-12-05 MED ORDER — METOPROLOL TARTRATE 12.5 MG HALF TABLET
12.5000 mg | ORAL_TABLET | Freq: Two times a day (BID) | ORAL | Status: DC
Start: 2023-12-05 — End: 2023-12-12
  Administered 2023-12-05 – 2023-12-12 (×15): 12.5 mg via ORAL
  Filled 2023-12-05 (×15): qty 1

## 2023-12-05 MED ORDER — IRON SUCROSE 200 MG IVPB - SIMPLE MED
200.0000 mg | Freq: Once | Status: DC
  Filled 2023-12-05: qty 110

## 2023-12-05 MED ORDER — FOLIC ACID 1 MG PO TABS
1.0000 mg | ORAL_TABLET | Freq: Every day | ORAL | Status: DC
  Administered 2023-12-05 – 2023-12-12 (×8): 1 mg via ORAL
  Filled 2023-12-05 (×8): qty 1

## 2023-12-05 MED ORDER — HEPARIN SODIUM (PORCINE) 5000 UNIT/ML IJ SOLN
5000.0000 [IU] | Freq: Three times a day (TID) | INTRAMUSCULAR | Status: DC
  Administered 2023-12-05 – 2023-12-12 (×21): 5000 [IU] via SUBCUTANEOUS
  Filled 2023-12-05 (×20): qty 1

## 2023-12-05 MED ORDER — ONDANSETRON HCL 4 MG/2ML IJ SOLN: 4.0000 mg | Freq: Four times a day (QID) | INTRAMUSCULAR | Status: DC | PRN

## 2023-12-05 MED ORDER — ONDANSETRON HCL 4 MG PO TABS: 4.0000 mg | ORAL_TABLET | Freq: Four times a day (QID) | ORAL | Status: DC | PRN

## 2023-12-05 MED ORDER — SERTRALINE HCL 50 MG PO TABS
50.0000 mg | ORAL_TABLET | Freq: Every day | ORAL | Status: DC
  Administered 2023-12-05 – 2023-12-12 (×8): 50 mg via ORAL
  Filled 2023-12-05 (×8): qty 1

## 2023-12-05 MED ORDER — VITAMIN B-12 1000 MCG PO TABS
1000.0000 ug | ORAL_TABLET | Freq: Every day | ORAL | Status: DC
  Administered 2023-12-06 – 2023-12-12 (×7): 1000 ug via ORAL
  Filled 2023-12-05 (×7): qty 1

## 2023-12-05 MED ORDER — ACETAMINOPHEN 650 MG RE SUPP: 650.0000 mg | Freq: Four times a day (QID) | RECTAL | Status: DC | PRN

## 2023-12-05 MED ORDER — LATANOPROST 0.005 % OP SOLN
1.0000 [drp] | Freq: Every day | OPHTHALMIC | Status: DC
  Administered 2023-12-06 – 2023-12-11 (×6): 1 [drp] via OPHTHALMIC
  Filled 2023-12-05: qty 2.5

## 2023-12-05 NOTE — Progress Notes (Signed)
Orthopedic Tech Progress Note Patient Details:  [name] May 04, 1928 [id number]  Level 2 trauma   Patient ID: Jon Smith, male   DOB: 06/28/1928, 89 y.o.   MRN: [id number]  Donald Pore 12/05/2023, 12:25 AM

## 2023-12-05 NOTE — H&P (Addendum)
History and Physical    Jon Smith NGE:[id number] DOB: 02-10-1928 DOA: 12/04/2023  PCP: [name], NP  Patient coming from: SNF  I have personally briefly reviewed patient's old medical records in [facility]  Chief Complaint:  frequent falls, global weakness  HPI: Jon Smith is a 89 y.o. male with medical history significant of  HLD,PAD, Prostate ca s/p treatment, HLD, HTN , anemia,chronic back pain who presents to ED for the 2nd timein 24 hours for fall.  Patient on this visit per chart had had 3 falls in the space of one hour at SNF. During fall patient was also reported to have hit his head. Patient of note is at baseline A+Ox3. Patient current states he did fall but not able to give much more history. He is aware he is hospital but states he is in [facility] is alert to self and states it is 1824. Of note patient was seen in ED at [facility] one day prior. IN any event patient denies /fever/chills/ cough/ sob/ chest pain/ n/v/ or diarrhea.   ED Course:  Vitals:96.7 bp 122/65, hr 74, rr 18, sat 100%  EKG: NSR, PAC , no hyperacute st-t wave changes Pelvis: NAD CTH NAD Cxr:NAD CT cervical spine:NAD Wbc 9.4, hgb 8.2 at around baseline Na 142,K 4.7, cl 113, bicarb 18-14, cr 2.51 ( 1.7-1.5) Tx ns 1l Review of Systems: As per HPI otherwise 10 point review of systems negative.   Past Medical History:  Diagnosis Date   Back pain    Elevated LFTs    Hepatitis    HLD (hyperlipidemia)    Hypertension    Incomplete bladder emptying    Lower extremity ulceration (HCC)    Nocturia    Peripheral arterial disease (HCC)    Prostate cancer (HCC) remote   Cryotherapy by Dr. Orson Slick.  PSA 5.54 in September at [facility]   Renal stones     Past Surgical History:  Procedure Laterality Date   AORTOGRAM     PERITONEAL CATHETER INSERTION     PROSTATE SURGERY     TRANSLUMINAL ANGIOPLASTY       reports that he has quit smoking. His smoking use included cigarettes. He has never used  smokeless tobacco. He reports that he does not drink alcohol and does not use drugs.  Allergies  Allergen Reactions   Aspirin Nausea And Vomiting   Sulfamethoxazole-Trimethoprim     Other reaction(s): Blood Disorder Coagulopathy    Family History  Problem Relation Age of Onset   Heart disease Brother    Kidney Stones Sister    Heart attack Sister    Heart attack Brother    Kidney disease Neg Hx    Prostate cancer Neg Hx     Prior to Admission medications   Medication Sig Start Date End Date Taking? Authorizing Provider  acetaminophen (TYLENOL) 325 MG tablet Take 650 mg by mouth every 6 (six) hours as needed for mild pain.    [provider]  alum & mag hydroxide-simeth (ANTACID LIQUID) 200-200-20 MG/5ML suspension Take by mouth 4 (four) times daily as needed for indigestion or heartburn.    [provider]  amoxicillin-clavulanate (AUGMENTIN) 875-125 MG tablet Take 1 tablet by mouth 2 (two) times daily. 11/10/23   [provider]  ASPIRIN LOW DOSE 81 MG EC tablet Take 81 mg by mouth daily. 03/26/21   [provider]  atorvastatin (LIPITOR) 40 MG tablet Take 40 mg by mouth daily. 10/12/20   [provider]  clopidogrel (PLAVIX) 75 MG tablet Take by mouth. 08/02/20   [provider]  cyanocobalamin 1000 MCG tablet Take 1 tablet (1,000 mcg total) by mouth daily. 11/30/23 12/30/23  Charise Killian, MD  ferrous sulfate 325 (65 FE) MG EC tablet Take 1 tablet by mouth in the morning and at bedtime. 02/26/21   [provider]  finasteride (PROSCAR) 5 MG tablet Take 1 tablet (5 mg total) by mouth daily. 04/29/23   Vanna Scotland, MD  latanoprost (XALATAN) 0.005 % ophthalmic solution Place 1 drop into both eyes at bedtime. 09/23/15   [provider]  lisinopril (ZESTRIL) 40 MG tablet Take 40 mg by mouth daily. 04/01/22   [provider]  loperamide (IMODIUM A-D) 2 MG tablet Take 2 mg by mouth 4 (four) times daily as  needed for diarrhea or loose stools.    [provider]  magnesium hydroxide (MILK OF MAGNESIA) 400 MG/5ML suspension Take 30 mLs by mouth daily as needed for mild constipation.    [provider]  metoprolol tartrate (LOPRESSOR) 25 MG tablet Take 25 mg by mouth 2 (two) times daily. 10/12/20   [provider]  sertraline (ZOLOFT) 50 MG tablet Take 50 mg by mouth daily. 11/07/23   [provider]  tamsulosin (FLOMAX) 0.4 MG CAPS capsule Take 1 capsule (0.4 mg total) by mouth daily. At bedtime Patient not taking: Reported on 11/25/2023 04/29/23   Vanna Scotland, MD    Physical Exam: Vitals:   12/04/23 2317 12/04/23 2322 12/04/23 2330 12/05/23 0015  BP:   112/68 (!) 118/46  Pulse:      Resp:   (!) 24 17  Temp:      TempSrc:      SpO2: 96%     Weight:  59.8 kg    Height:  5\' 8"  (1.727 m)      Constitutional: NAD, calm, comfortable Vitals:   12/04/23 2317 12/04/23 2322 12/04/23 2330 12/05/23 0015  BP:   112/68 (!) 118/46  Pulse:      Resp:   (!) 24 17  Temp:      TempSrc:      SpO2: 96%     Weight:  59.8 kg    Height:  5\' 8"  (1.727 m)     Eyes: PERRL, lids and conjunctivae normal ENMT: Mucous membranes are dry Neck: normal, supple, no masses, no thyromegaly Respiratory: clear to auscultation bilaterally, no wheezing, no crackles. Normal respiratory effort. No accessory muscle use.  Cardiovascular: Regular rate and rhythm, no murmurs / rubs / gallops. No extremity edema.warm lower ext Abdomen: no tenderness, no masses palpated. No hepatosplenomegaly. Bowel sounds positive.  Musculoskeletal: no clubbing / cyanosis. No joint deformity upper and lower extremities. Good ROM, no contractures. Normal muscle tone.  Skin: no rashes, lesions, ulcers. No induration Neurologic: CN 2-12 grossly intact. Sensation intact, MAE x 4 Psychiatric: Normal judgment and insight. Alert and oriented x 2 Normal mood.    Labs on Admission: I have personally reviewed  following labs and imaging studies  CBC: Recent Labs  Lab 11/28/23 0522 11/29/23 0405 11/30/23 0655 12/04/23 1231 12/04/23 2304  WBC 10.1 10.6* 12.3* 9.4 8.9  NEUTROABS  --   --  10.2*  --  6.4  HGB 7.9* 8.3* 9.4* 8.2* 8.3*  HCT 23.8* 25.9* 29.7* 25.9* 27.3*  MCV 89.1 89.9 91.4 90.2 91.6  PLT 297 302 309 342 285   Basic Metabolic Panel: Recent Labs  Lab 11/28/23 0522 11/29/23 0405 11/30/23 0655 12/04/23 1231  12/04/23 2304  NA 136 134* 138 142 139  K 4.8 4.7 4.9 4.7 5.5*  CL 109 107 108 113* 115*  CO2 17* 18* 19* 18* 14*  GLUCOSE 108* 111* 123* 111* 118*  BUN 49* 51* 59* 73* 75*  CREATININE 1.94* 1.79* 2.30* 2.51* 2.45*  CALCIUM 8.9 8.9 9.6 8.8* 9.2  MG 2.3  --   --   --   --   PHOS 3.0  --   --   --   --    GFR: Estimated Creatinine Clearance: 15.9 mL/min (A) (by C-G formula based on SCr of 2.45 mg/dL (H)). Liver Function Tests: No results for input(s): "AST", "ALT", "ALKPHOS", "BILITOT", "PROT", "ALBUMIN" in the last 168 hours. No results for input(s): "LIPASE", "AMYLASE" in the last 168 hours. No results for input(s): "AMMONIA" in the last 168 hours. Coagulation Profile: No results for input(s): "INR", "PROTIME" in the last 168 hours. Cardiac Enzymes: Recent Labs  Lab 12/04/23 2304  CKTOTAL 252   BNP (last 3 results) No results for input(s): "PROBNP" in the last 8760 hours. HbA1C: No results for input(s): "HGBA1C" in the last 72 hours. CBG: Recent Labs  Lab 11/28/23 1343 12/04/23 1242  GLUCAP 99 85   Lipid Profile: No results for input(s): "CHOL", "HDL", "LDLCALC", "TRIG", "CHOLHDL", "LDLDIRECT" in the last 72 hours. Thyroid Function Tests: No results for input(s): "TSH", "T4TOTAL", "FREET4", "T3FREE", "THYROIDAB" in the last 72 hours. Anemia Panel: No results for input(s): "VITAMINB12", "FOLATE", "FERRITIN", "TIBC", "IRON", "RETICCTPCT" in the last 72 hours. Urine analysis:    Component Value Date/Time   COLORURINE YELLOW (A) 04/11/2023 1221    APPEARANCEUR CLOUDY (A) 04/11/2023 1221   APPEARANCEUR Clear 01/16/2016 1500   LABSPEC 1.012 04/11/2023 1221   PHURINE 5.0 04/11/2023 1221   GLUCOSEU NEGATIVE 04/11/2023 1221   HGBUR MODERATE (A) 04/11/2023 1221   BILIRUBINUR NEGATIVE 04/11/2023 1221   BILIRUBINUR Negative 01/16/2016 1500   KETONESUR NEGATIVE 04/11/2023 1221   PROTEINUR 30 (A) 04/11/2023 1221   NITRITE NEGATIVE 04/11/2023 1221   LEUKOCYTESUR SMALL (A) 04/11/2023 1221    Radiological Exams on Admission: CT CERVICAL SPINE WO CONTRAST  Result Date: 12/04/2023 CLINICAL DATA:  Status post fall. EXAM: CT CERVICAL SPINE WITHOUT CONTRAST TECHNIQUE: Multidetector CT imaging of the cervical spine was performed without intravenous contrast. Multiplanar CT image reconstructions were also generated. RADIATION DOSE REDUCTION: This exam was performed according to the departmental dose-optimization program which includes automated exposure control, adjustment of the mA and/or kV according to patient size and/or use of iterative reconstruction technique. COMPARISON:  None Available. FINDINGS: Alignment: Normal. Skull base and vertebrae: No acute fracture. No primary bone lesion or focal pathologic process. Soft tissues and spinal canal: No prevertebral fluid or swelling. No visible canal hematoma. Disc levels: Mild to moderate severity multilevel endplate sclerosis, moderate to marked severity anterior osteophyte formation and moderate to marked severity posterior bony spurring are seen throughout all levels of the cervical spine. There is marked severity narrowing of the anterior and lateral axial articulation. Moderate severity intervertebral disc space narrowing is seen at the levels of C4-C5, C5-C6 and C6-C7. Bilateral marked severity multilevel facet joint hypertrophy is noted. Upper chest: A 2.5 mm posterior left apical noncalcified lung nodule is seen (axial CT image 76, CT series 4). This is stable in size and appearance when compared to the  patient's most recent chest CT (April 11, 2023). Other: None. IMPRESSION: 1. No acute fracture or subluxation of the cervical spine. 2. Moderate to marked  severity multilevel degenerative changes throughout the cervical spine. 3. 2.5 mm posterior left apical noncalcified lung nodule. Electronically Signed   By: Aram Candela M.D.   On: 12/04/2023 23:52   CT HEAD WO CONTRAST  Result Date: 12/04/2023 CLINICAL DATA:  Status post fall. EXAM: CT HEAD WITHOUT CONTRAST TECHNIQUE: Contiguous axial images were obtained from the base of the skull through the vertex without intravenous contrast. RADIATION DOSE REDUCTION: This exam was performed according to the departmental dose-optimization program which includes automated exposure control, adjustment of the mA and/or kV according to patient size and/or use of iterative reconstruction technique. COMPARISON:  December 04, 2023 (10:36 a.m.) FINDINGS: Brain: There is moderate severity cerebral atrophy with widening of the extra-axial spaces and ventricular dilatation. There are areas of decreased attenuation within the white matter tracts of the supratentorial brain, consistent with microvascular disease changes. Chronic bilateral basal ganglia lacunar infarcts are noted. Vascular: No hyperdense vessel or unexpected calcification. Skull: A chronic right-sided nasal bone fracture is noted. Sinuses/Orbits: No acute finding. Other: None. IMPRESSION: 1. Generalized cerebral atrophy with chronic white matter small vessel ischemic changes. 2. No acute intracranial abnormality. Electronically Signed   By: Aram Candela M.D.   On: 12/04/2023 23:36   DG Chest Port 1 View  Result Date: 12/04/2023 CLINICAL DATA:  Status post fall. EXAM: PORTABLE CHEST 1 VIEW COMPARISON:  None Available. FINDINGS: The heart size and mediastinal contours are within normal limits. There is mild, stable elevation of the right hemidiaphragm. Both lungs are clear. The visualized skeletal  structures are unremarkable. IMPRESSION: No active cardiopulmonary disease. Electronically Signed   By: Aram Candela M.D.   On: 12/04/2023 23:31   CT HEAD WO CONTRAST ( )  Result Date: 12/04/2023 CLINICAL DATA:  Head trauma, minor (Age >= 65y); Neck trauma (Age >= 65y). EXAM: CT HEAD WITHOUT CONTRAST CT CERVICAL SPINE WITHOUT CONTRAST TECHNIQUE: Multidetector CT imaging of the head and cervical spine was performed following the standard protocol without intravenous contrast. Multiplanar CT image reconstructions of the cervical spine were also generated. RADIATION DOSE REDUCTION: This exam was performed according to the departmental dose-optimization program which includes automated exposure control, adjustment of the mA and/or kV according to patient size and/or use of iterative reconstruction technique. COMPARISON:  CT scan head from 04/11/2023. FINDINGS: CT HEAD FINDINGS Brain: No evidence of acute infarction, hemorrhage, hydrocephalus, extra-axial collection or mass lesion/mass effect. There is bilateral periventricular hypodensity, which is non-specific but most likely seen in the settings of microvascular ischemic changes. Moderate in extent. Otherwise normal appearance of brain parenchyma. Ventricles are normal. Cerebral volume is age appropriate. Vascular: No hyperdense vessel or unexpected calcification. Intracranial arteriosclerosis. Skull: Normal. Negative for fracture or focal lesion. Sinuses/Orbits: No acute finding. Other: Visualized mastoid air cells are unremarkable. No mastoid effusion. CT CERVICAL SPINE FINDINGS Alignment: Normal. This examination does not assess for ligamentous injury or stability. Skull base and vertebrae: No acute fracture. No primary bone lesion or focal pathologic process. Soft tissues and spinal canal: No prevertebral fluid or swelling. No visible canal hematoma. Disc levels: Mild-to-moderately reduced disc height of C4-5 through C7-T1 levels. Is mild multilevel  facet arthropathy and moderate multilevel marginal osteophyte formation. Upper chest: Negative. Other: None. IMPRESSION: *No acute intracranial abnormality. *No acute osseous injury or traumatic listhesis of the cervical spine. Electronically Signed   By: Jules Schick M.D.   On: 12/04/2023 11:34   CT Cervical Spine Wo Contrast  Result Date: 12/04/2023 CLINICAL DATA:  Head trauma, minor (Age >= 65y);  Neck trauma (Age >= 65y). EXAM: CT HEAD WITHOUT CONTRAST CT CERVICAL SPINE WITHOUT CONTRAST TECHNIQUE: Multidetector CT imaging of the head and cervical spine was performed following the standard protocol without intravenous contrast. Multiplanar CT image reconstructions of the cervical spine were also generated. RADIATION DOSE REDUCTION: This exam was performed according to the departmental dose-optimization program which includes automated exposure control, adjustment of the mA and/or kV according to patient size and/or use of iterative reconstruction technique. COMPARISON:  CT scan head from 04/11/2023. FINDINGS: CT HEAD FINDINGS Brain: No evidence of acute infarction, hemorrhage, hydrocephalus, extra-axial collection or mass lesion/mass effect. There is bilateral periventricular hypodensity, which is non-specific but most likely seen in the settings of microvascular ischemic changes. Moderate in extent. Otherwise normal appearance of brain parenchyma. Ventricles are normal. Cerebral volume is age appropriate. Vascular: No hyperdense vessel or unexpected calcification. Intracranial arteriosclerosis. Skull: Normal. Negative for fracture or focal lesion. Sinuses/Orbits: No acute finding. Other: Visualized mastoid air cells are unremarkable. No mastoid effusion. CT CERVICAL SPINE FINDINGS Alignment: Normal. This examination does not assess for ligamentous injury or stability. Skull base and vertebrae: No acute fracture. No primary bone lesion or focal pathologic process. Soft tissues and spinal canal: No  prevertebral fluid or swelling. No visible canal hematoma. Disc levels: Mild-to-moderately reduced disc height of C4-5 through C7-T1 levels. Is mild multilevel facet arthropathy and moderate multilevel marginal osteophyte formation. Upper chest: Negative. Other: None. IMPRESSION: *No acute intracranial abnormality. *No acute osseous injury or traumatic listhesis of the cervical spine. Electronically Signed   By: Jules Schick M.D.   On: 12/04/2023 11:34   DG Pelvis 1-2 Views  Result Date: 12/04/2023 CLINICAL DATA:  Status post fall from standing. EXAM: PELVIS - 1-2 VIEW COMPARISON:  10/01/2012 FINDINGS: No evidence for an acute fracture. SI joints and symphysis pubis unremarkable. Degenerative changes are noted in the hips, left greater than right. Heterotopic ossification in the region of the lesser trochanter of the left femoral neck is compatible with chronic/repetitive soft tissue injury. Evidence of previous ventral mesh placement noted over the lower pelvis. IMPRESSION: 1. No acute bony findings. 2. Degenerative changes in the hips, left greater than right. Electronically Signed   By: Kennith Center M.D.   On: 12/04/2023 11:26   DG Chest 1 View  Result Date: 12/04/2023 CLINICAL DATA:  fall. EXAM: CHEST  1 VIEW COMPARISON:  04/11/2023. FINDINGS: Low lung volume. Bilateral lung fields are clear. Note is made of elevated right hemidiaphragm. Bilateral costophrenic angles are clear. Stable cardio-mediastinal silhouette. No acute osseous abnormalities. The soft tissues are within normal limits. IMPRESSION: No active disease. Electronically Signed   By: Jules Schick M.D.   On: 12/04/2023 11:26    EKG: Independently reviewed. See above  Assessment/Plan   AKI on CKD  -noted associated metabolic acidosis --vbg pending  -hold nephrotoxic medications  -ivfs  -repeat labs -f/u with renal u/s    Mild hyperkalemia -repeat labs  -treat based on repeat  Chronic debility with frequent falls -PT/OT    Chronic back pain  -supportive care   Chronic microcytic anemia -check anemia labs  -continue on iron supplements   HTN  -continue on metoprolol  -home ace for now resume once renal function stable   BPH: continue on home dose of tamsulosin, finasteride    Depression: severity unknown. Continue on home dose of sertraline   HLD -continue on statin    Prostate ca  -s/p treatment  -in remission    DVT prophylaxis: heparin Code  Status: full/ as discussed per patient wishes in event of cardiac arrest  Family Communication: none at bedside  Disposition Plan: patient  expected to be admitted greater than 2 midnights  Consults called: n/a  Admission status: med tele   Lurline Del MD [facility]   If 7PM-7AM, please contact night-coverage [email or web address] Password [facility]  12/05/2023, 12:47 AM

## 2023-12-05 NOTE — Progress Notes (Addendum)
[facility]                                                                              Jon Smith, is a 89 y.o. male, DOB - 19-Sep-1928, BJY:[id number] Admit date - 12/04/2023    Outpatient Primary MD for the patient is [name], NP  LOS - 0  days  Chief Complaint  Patient presents with   Fall    Fall on thinners       Brief summary   Patient is a 89 year old male with HLD, PAD, prostate CA status posttreatment, hypertension, anemia, chronic back pain, dementia, CKD presented to ED from ALF with multiple falls.  Patient was seen in ED on 11/30/2023 for AKI, altered mental status.  Patient presented to [facility] ED on 12/5 with a fall, CT head was negative for SDH or IPH, reassuring blood work and was discharged back to facility.  Patient returned back to [facility] ED 12 hours later with 3 falls within an hour.  He has a history of sundowning. In ED, temp 96.7 F, RR 18, HR 88  BP 128/60, O2 sats 96% on room air Labs showed sodium 141, CO2 12, BUN 58, creatinine 1.84 Hemoglobin 7.2  Assessment & Plan    Principal Problem: Acute kidney injury on CKD stage IIIb GFR 30-44 ml/min (HCC) -Baseline creatinine 1.6-1.9, creatinine was 2.3 on 11/30/2023 -Presented with creatinine of 1.8 on 12/6, trended up to 2.1 this morning -Continue IV fluid hydration, follow renal ultrasound   Active Problems: Acute on chronic anemia, anemia of chronic disease -Anemia profile shows ferritin 27, iron 16, TIBC 286, percent saturation ratio 6 -Hb 7.2, baseline ~8  -No evidence of GI bleeding, check FOBT -Will transfuse 1 unit packed RBCs, IV Venofer x 1  Folic acid deficiency -Folate 5.7, placed on folic acid 1 mg daily -Continue B12   Essential hypertension -BP currently stable, hold lisinopril due to AKI -Resume metoprolol, 12.5 mg twice daily  Recurrent falls, generalized debility, worsening dementia -PT OT evaluation, patient is currently at ALF, has history of  dementia and sundowning, has frequent falls  -May need higher level of care -Will consult palliative medicine for goals of care  BPH -Continue tamsulosin, finasteride  Hyperlipidemia -Continue statin  Prostate CA status post treatment -In remission    Dementia (HCC) -Continue Zoloft  Lung nodule -CT scan C-spine showed 2.5 mm posterior left apical noncalcified lung nodule, stable in size and appearance when compared to recent CT chest in April 2024    Pressure injury of skin -Right buttock stage II, POA -Scrotum lower stage II, POA  Estimated body mass index is 20.05 kg/m as calculated from the following:   Height as of this encounter: 5\' 8"  (1.727 m).   Weight as of this encounter: 59.8 kg.  Code Status: Full CODE STATUS DVT Prophylaxis:  heparin injection 5,000 Units Start: 12/05/23 0600   Level of Care: Level of care: Telemetry Medical Family Communication: Updated patient's son and wife on phone  Disposition Plan:      Remains inpatient appropriate:   Discussed in detail with the patient's family, they feel that he  needs a higher level of care and is not thriving in the assisted living facility environment with recurrent falls.   Procedures:    Consultants:     Antimicrobials:   Anti-infectives (From admission, onward)    None          Medications  heparin  5,000 Units Subcutaneous Q8H      Subjective:   Jon Smith was seen and examined today.  Confused, oriented to self only, denies any significant pain.  No nausea vomiting, abdominal pain, chest pain or shortness of breath.  No fevers Objective:   Vitals:   12/05/23 0045 12/05/23 0115 12/05/23 0205 12/05/23 0852  BP: (!) 132/50 (!) 108/46 (!) 134/50 (!) 126/52  Pulse:  89 90 82  Resp: 18 15 16 17   Temp:   98.1 F (36.7 C) 97.9 F (36.6 C)  TempSrc:   Oral Oral  SpO2:  100% 100% 100%  Weight:      Height:        Intake/Output Summary (Last 24 hours) at 12/05/2023 1105 Last data  filed at 12/05/2023 0328 Gross per 24 hour  Intake 1178.92 ml  Output --  Net 1178.92 ml     Wt Readings from Last 3 Encounters:  12/04/23 59.8 kg  12/04/23 59.8 kg  11/29/23 59.8 kg     Exam General: Alert and oriented x self, confused Cardiovascular: S1 S2 auscultated,  RRR Respiratory: Clear to auscultation bilaterally Gastrointestinal: Soft, nontender, nondistended, + bowel sounds Ext: no pedal edema bilaterally Neuro: moving all 4 extremities spontaneously, not following commands Psych: dementia    Data Reviewed:  I have personally reviewed following labs    CBC Lab Results  Component Value Date   WBC 8.1 12/05/2023   RBC 2.60 (L) 12/05/2023   RBC 2.55 (L) 12/05/2023   HGB 7.2 (L) 12/05/2023   HCT 23.6 (L) 12/05/2023   MCV 90.8 12/05/2023   MCH 27.7 12/05/2023   PLT 310 12/05/2023   MCHC 30.5 12/05/2023   RDW 17.2 (H) 12/05/2023   LYMPHSABS 1.2 12/04/2023   MONOABS 0.9 12/04/2023   EOSABS 0.3 12/04/2023   BASOSABS 0.1 12/04/2023     Last metabolic panel Lab Results  Component Value Date   NA 138 12/05/2023   K 4.4 12/05/2023   CL 116 (H) 12/05/2023   CO2 16 (L) 12/05/2023   BUN 66 (H) 12/05/2023   CREATININE 2.13 (H) 12/05/2023   GLUCOSE 100 (H) 12/05/2023   GFRNONAA 28 (L) 12/05/2023   GFRAA 38 (L) 04/02/2020   CALCIUM 8.1 (L) 12/05/2023   PHOS 3.0 11/28/2023   PROT 4.6 (L) 12/05/2023   ALBUMIN 2.1 (L) 12/05/2023   BILITOT 0.3 12/05/2023   ALKPHOS 39 12/05/2023   AST 16 12/05/2023   ALT 21 12/05/2023   ANIONGAP 6 12/05/2023    CBG (last 3)  Recent Labs    12/04/23 1242  GLUCAP 85      Coagulation Profile: No results for input(s): "INR", "PROTIME" in the last 168 hours.   Radiology Studies: I have personally reviewed the imaging studies  CT CERVICAL SPINE WO CONTRAST  Result Date: 12/04/2023 CLINICAL DATA:  Status post fall. EXAM: CT CERVICAL SPINE WITHOUT CONTRAST TECHNIQUE: Multidetector CT imaging of the cervical spine was  performed without intravenous contrast. Multiplanar CT image reconstructions were also generated. RADIATION DOSE REDUCTION: This exam was performed according to the departmental dose-optimization program which includes automated exposure control, adjustment of the mA and/or kV according to patient size and/or use  of iterative reconstruction technique. COMPARISON:  None Available. FINDINGS: Alignment: Normal. Skull base and vertebrae: No acute fracture. No primary bone lesion or focal pathologic process. Soft tissues and spinal canal: No prevertebral fluid or swelling. No visible canal hematoma. Disc levels: Mild to moderate severity multilevel endplate sclerosis, moderate to marked severity anterior osteophyte formation and moderate to marked severity posterior bony spurring are seen throughout all levels of the cervical spine. There is marked severity narrowing of the anterior and lateral axial articulation. Moderate severity intervertebral disc space narrowing is seen at the levels of C4-C5, C5-C6 and C6-C7. Bilateral marked severity multilevel facet joint hypertrophy is noted. Upper chest: A 2.5 mm posterior left apical noncalcified lung nodule is seen (axial CT image 76, CT series 4). This is stable in size and appearance when compared to the patient's most recent chest CT (April 11, 2023). Other: None. IMPRESSION: 1. No acute fracture or subluxation of the cervical spine. 2. Moderate to marked severity multilevel degenerative changes throughout the cervical spine. 3. 2.5 mm posterior left apical noncalcified lung nodule. Electronically Signed   By: Aram Candela M.D.   On: 12/04/2023 23:52   CT HEAD WO CONTRAST  Result Date: 12/04/2023 CLINICAL DATA:  Status post fall. EXAM: CT HEAD WITHOUT CONTRAST TECHNIQUE: Contiguous axial images were obtained from the base of the skull through the vertex without intravenous contrast. RADIATION DOSE REDUCTION: This exam was performed according to the departmental  dose-optimization program which includes automated exposure control, adjustment of the mA and/or kV according to patient size and/or use of iterative reconstruction technique. COMPARISON:  December 04, 2023 (10:36 a.m.) FINDINGS: Brain: There is moderate severity cerebral atrophy with widening of the extra-axial spaces and ventricular dilatation. There are areas of decreased attenuation within the white matter tracts of the supratentorial brain, consistent with microvascular disease changes. Chronic bilateral basal ganglia lacunar infarcts are noted. Vascular: No hyperdense vessel or unexpected calcification. Skull: A chronic right-sided nasal bone fracture is noted. Sinuses/Orbits: No acute finding. Other: None. IMPRESSION: 1. Generalized cerebral atrophy with chronic white matter small vessel ischemic changes. 2. No acute intracranial abnormality. Electronically Signed   By: Aram Candela M.D.   On: 12/04/2023 23:36   DG Chest Port 1 View  Result Date: 12/04/2023 CLINICAL DATA:  Status post fall. EXAM: PORTABLE CHEST 1 VIEW COMPARISON:  None Available. FINDINGS: The heart size and mediastinal contours are within normal limits. There is mild, stable elevation of the right hemidiaphragm. Both lungs are clear. The visualized skeletal structures are unremarkable. IMPRESSION: No active cardiopulmonary disease. Electronically Signed   By: Aram Candela M.D.   On: 12/04/2023 23:31   CT HEAD WO CONTRAST ( )  Result Date: 12/04/2023 CLINICAL DATA:  Head trauma, minor (Age >= 65y); Neck trauma (Age >= 65y). EXAM: CT HEAD WITHOUT CONTRAST CT CERVICAL SPINE WITHOUT CONTRAST TECHNIQUE: Multidetector CT imaging of the head and cervical spine was performed following the standard protocol without intravenous contrast. Multiplanar CT image reconstructions of the cervical spine were also generated. RADIATION DOSE REDUCTION: This exam was performed according to the departmental dose-optimization program which  includes automated exposure control, adjustment of the mA and/or kV according to patient size and/or use of iterative reconstruction technique. COMPARISON:  CT scan head from 04/11/2023. FINDINGS: CT HEAD FINDINGS Brain: No evidence of acute infarction, hemorrhage, hydrocephalus, extra-axial collection or mass lesion/mass effect. There is bilateral periventricular hypodensity, which is non-specific but most likely seen in the settings of microvascular ischemic changes. Moderate in extent. Otherwise  normal appearance of brain parenchyma. Ventricles are normal. Cerebral volume is age appropriate. Vascular: No hyperdense vessel or unexpected calcification. Intracranial arteriosclerosis. Skull: Normal. Negative for fracture or focal lesion. Sinuses/Orbits: No acute finding. Other: Visualized mastoid air cells are unremarkable. No mastoid effusion. CT CERVICAL SPINE FINDINGS Alignment: Normal. This examination does not assess for ligamentous injury or stability. Skull base and vertebrae: No acute fracture. No primary bone lesion or focal pathologic process. Soft tissues and spinal canal: No prevertebral fluid or swelling. No visible canal hematoma. Disc levels: Mild-to-moderately reduced disc height of C4-5 through C7-T1 levels. Is mild multilevel facet arthropathy and moderate multilevel marginal osteophyte formation. Upper chest: Negative. Other: None. IMPRESSION: *No acute intracranial abnormality. *No acute osseous injury or traumatic listhesis of the cervical spine. Electronically Signed   By: Jules Schick M.D.   On: 12/04/2023 11:34   CT Cervical Spine Wo Contrast  Result Date: 12/04/2023 CLINICAL DATA:  Head trauma, minor (Age >= 65y); Neck trauma (Age >= 65y). EXAM: CT HEAD WITHOUT CONTRAST CT CERVICAL SPINE WITHOUT CONTRAST TECHNIQUE: Multidetector CT imaging of the head and cervical spine was performed following the standard protocol without intravenous contrast. Multiplanar CT image reconstructions of  the cervical spine were also generated. RADIATION DOSE REDUCTION: This exam was performed according to the departmental dose-optimization program which includes automated exposure control, adjustment of the mA and/or kV according to patient size and/or use of iterative reconstruction technique. COMPARISON:  CT scan head from 04/11/2023. FINDINGS: CT HEAD FINDINGS Brain: No evidence of acute infarction, hemorrhage, hydrocephalus, extra-axial collection or mass lesion/mass effect. There is bilateral periventricular hypodensity, which is non-specific but most likely seen in the settings of microvascular ischemic changes. Moderate in extent. Otherwise normal appearance of brain parenchyma. Ventricles are normal. Cerebral volume is age appropriate. Vascular: No hyperdense vessel or unexpected calcification. Intracranial arteriosclerosis. Skull: Normal. Negative for fracture or focal lesion. Sinuses/Orbits: No acute finding. Other: Visualized mastoid air cells are unremarkable. No mastoid effusion. CT CERVICAL SPINE FINDINGS Alignment: Normal. This examination does not assess for ligamentous injury or stability. Skull base and vertebrae: No acute fracture. No primary bone lesion or focal pathologic process. Soft tissues and spinal canal: No prevertebral fluid or swelling. No visible canal hematoma. Disc levels: Mild-to-moderately reduced disc height of C4-5 through C7-T1 levels. Is mild multilevel facet arthropathy and moderate multilevel marginal osteophyte formation. Upper chest: Negative. Other: None. IMPRESSION: *No acute intracranial abnormality. *No acute osseous injury or traumatic listhesis of the cervical spine. Electronically Signed   By: Jules Schick M.D.   On: 12/04/2023 11:34   DG Pelvis 1-2 Views  Result Date: 12/04/2023 CLINICAL DATA:  Status post fall from standing. EXAM: PELVIS - 1-2 VIEW COMPARISON:  10/01/2012 FINDINGS: No evidence for an acute fracture. SI joints and symphysis pubis unremarkable.  Degenerative changes are noted in the hips, left greater than right. Heterotopic ossification in the region of the lesser trochanter of the left femoral neck is compatible with chronic/repetitive soft tissue injury. Evidence of previous ventral mesh placement noted over the lower pelvis. IMPRESSION: 1. No acute bony findings. 2. Degenerative changes in the hips, left greater than right. Electronically Signed   By: Kennith Center M.D.   On: 12/04/2023 11:26   DG Chest 1 View  Result Date: 12/04/2023 CLINICAL DATA:  fall. EXAM: CHEST  1 VIEW COMPARISON:  04/11/2023. FINDINGS: Low lung volume. Bilateral lung fields are clear. Note is made of elevated right hemidiaphragm. Bilateral costophrenic angles are clear. Stable cardio-mediastinal silhouette. No  acute osseous abnormalities. The soft tissues are within normal limits. IMPRESSION: No active disease. Electronically Signed   By: Jules Schick M.D.   On: 12/04/2023 11:26       Lenoir Facchini M.D. [facility] 12/05/2023, 11:05 AM  Available via Epic secure chat 7am-7pm After 7 pm, please refer to night coverage provider listed on amion.

## 2023-12-05 NOTE — Evaluation (Addendum)
Physical Therapy Evaluation Patient Details Name: Jon Smith MRN: [id number] DOB: [birth year] Today's Date: 12/05/2023  History of Present Illness  89 y.o. male presents to [facility] from [place] on 12/04/23 after falling multiple times in one day with earlier admit to [facility]. Admitted w/ AKI on CKD. PMH: CKD, HTN, HLD, PrCA, anemia, PAD, back pain   Clinical Impression  Pt in bed upon arrival and agreeable to PT eval. Prior to admission, pt reports performing lateral scoots to his WC to go to the cafeteria without physical assist. Of note, pt has had multiple previous falls. In today's session, pt was able to perform lateral scoots at EOB with CGA for safety. Pt required Mod-MaxA for squat pivot transfer due to not having a drop arm recliner in the room. Pt is likely close to functional mobility baseline. Pt presents to therapy session with decreased LE ROM, strength, balance, and mobility. Pt would benefit from acute skilled PT to address functional impairments. Recommending post-acute rehab <3hrs to prevent future falls. Acute PT to follow.         If plan is discharge home, recommend the following: A little help with walking and/or transfers;A little help with bathing/dressing/bathroom;Assist for transportation;Help with stairs or ramp for entrance   Can travel by private vehicle    No    Equipment Recommendations None recommended by PT     Functional Status Assessment Patient has had a recent decline in their functional status and demonstrates the ability to make significant improvements in function in a reasonable and predictable amount of time.     Precautions / Restrictions Precautions Precautions: Fall Restrictions Weight Bearing Restrictions: No      Mobility  Bed Mobility Overal bed mobility: Needs Assistance Bed Mobility: Supine to Sit     Supine to sit: Min assist     General bed mobility comments: MinA for trunk elevation    Transfers Overall transfer  level: Needs assistance Equipment used: None Transfers: Bed to chair/wheelchair/BSC       Squat pivot transfers: Max assist, Mod assist    Lateral/Scoot Transfers: Contact guard assist General transfer comment: Mod-MaxA for squat pivot to recliner, pt unable to reach standing due to limited knee ext. CGA for lateral scoots at EOB    Ambulation/Gait  General Gait Details: unable at baseline        Balance Overall balance assessment: Needs assistance Sitting-balance support: Feet supported, Bilateral upper extremity supported Sitting balance-Leahy Scale: Poor Sitting balance - Comments: requires MaxA-CGA posteriorly for support at EOB. Needs cues to lean forwards Postural control: Posterior lean        Pertinent Vitals/Pain Pain Assessment Pain Assessment: No/denies pain    Home Living Family/patient expects to be discharged to:: Skilled nursing facility  Home Equipment: Wheelchair - manual Additional Comments: pt performs lateral scoots to/from [facility] w/ supervision at baseline. Does not use RW    Prior Function Prior Level of Function : Needs assist;History of Falls (last six months)       Physical Assist : Mobility (physical);ADLs (physical) Mobility (physical): Transfers ADLs (physical): Dressing;IADLs Mobility Comments: supervision for transfers at facility ADLs Comments: occasional assist with don/dof socks and LB dressing     Extremity/Trunk Assessment   Upper Extremity Assessment Upper Extremity Assessment: Defer to OT evaluation    Lower Extremity Assessment Lower Extremity Assessment: Generalized weakness (limited knee extension ROM)    Cervical / Trunk Assessment Cervical / Trunk Assessment: Kyphotic  Communication   Communication Communication: Hearing impairment Following  commands: Follows one step commands consistently Cueing Techniques: Verbal cues;Tactile cues  Cognition Arousal: Alert Behavior During Therapy: WFL for tasks  assessed/performed Overall Cognitive Status: History of cognitive impairments - at baseline  General Comments: not oriented to date and does not remember falling        General Comments General comments (skin integrity, edema, etc.): SpO2 100% on RA     PT Assessment Patient needs continued PT services  PT Problem List Decreased strength;Decreased range of motion;Decreased activity tolerance;Decreased balance;Decreased mobility       PT Treatment Interventions DME instruction;Gait training;Functional mobility training;Therapeutic activities;Therapeutic exercise;Balance training;Neuromuscular re-education;Patient/family education;Wheelchair mobility training    PT Goals (Current goals can be found in the Care Plan section)  Acute Rehab PT Goals Patient Stated Goal: To go home PT Goal Formulation: With patient Time For Goal Achievement: 12/19/23 Potential to Achieve Goals: Fair    Frequency Min 1X/week     Co-evaluation   Reason for Co-Treatment: For patient/therapist safety;To address functional/ADL transfers PT goals addressed during session: Balance;Mobility/safety with mobility         AM-PAC PT "6 Clicks" Mobility  Outcome Measure Help needed turning from your back to your side while in a flat bed without using bedrails?: None Help needed moving from lying on your back to sitting on the side of a flat bed without using bedrails?: A Little Help needed moving to and from a bed to a chair (including a wheelchair)?: A Lot Help needed standing up from a chair using your arms (e.g., wheelchair or bedside chair)?: Total Help needed to walk in hospital room?: Total Help needed climbing 3-5 steps with a railing? : Total 6 Click Score: 12    End of Session Equipment Utilized During Treatment: Gait belt Activity Tolerance: Patient tolerated treatment well Patient left: in chair;with call bell/phone within reach;with chair alarm set Nurse Communication: Mobility status PT  Visit Diagnosis: Unsteadiness on feet (R26.81);Muscle weakness (generalized) (M62.81)    Time: 1020-1051 PT Time Calculation (min) (ACUTE ONLY): 31 min   Charges:   PT Evaluation $PT Eval Low Complexity: 1 Low   PT General Charges $$ ACUTE PT VISIT: 1 Visit        Hilton Cork, PT, DPT Secure Chat Preferred  Rehab Office [phone]  Arturo Morton Brion Aliment 12/05/2023, 11:26 AM

## 2023-12-05 NOTE — ED Notes (Signed)
ED TO INPATIENT HANDOFF REPORT  ED Nurse Name and Phone #: Darral Dash RN [phone]  S Name/Age/Gender Jon Smith 89 y.o. male Room/Bed: 028C/028C  Code Status   Code Status: Full Code  Home/SNF/Other: [facility]   Patient oriented to: self and place Is this baseline? Yes   Triage Complete: Triage complete  Chief Complaint Acute renal failure [facility]) [N17.9]  Triage Note Pt BIB [facility] from [facility], Pt had a fall this morning and went to [facility] this morning but presented today with the second fall due to patient hitting his head on a dresser. Patient is on thinners. Per EMS pt GCS was 15. Patient has a history of sundowning and known to have frequent falls.    Allergies Allergies  Allergen Reactions   Aspirin Nausea And Vomiting   Sulfamethoxazole-Trimethoprim     Other reaction(s): Blood Disorder Coagulopathy    Level of Care/Admitting Diagnosis ED Disposition     ED Disposition  Admit   Condition  --   Comment  Hospital Area: [facility] [[id number]]  Level of Care: Telemetry Medical [104]  May admit patient to [facility] or [facility] if equivalent level of care is available:: No  Covid Evaluation: Symptomatic Person Under Investigation (PUI) or recent exposure (last 10 days) *Testing Required*  Diagnosis: Acute renal failure [facility]) [[id number]]  Admitting Physician: Lurline Del [[id number]]  Attending Physician: Lurline Del [[id number]]  Certification:: I certify this patient will need inpatient services for at least 2 midnights  Expected Medical Readiness: 12/09/2023          B Medical/Surgery History Past Medical History:  Diagnosis Date   Back pain    Elevated LFTs    Hepatitis    HLD (hyperlipidemia)    Hypertension    Incomplete bladder emptying    Lower extremity ulceration (HCC)    Nocturia    Peripheral arterial disease (HCC)    Prostate cancer (HCC) remote    Cryotherapy by Dr. Orson Slick.  PSA 5.54 in September at [facility]   Renal stones    Past Surgical History:  Procedure Laterality Date   AORTOGRAM     PERITONEAL CATHETER INSERTION     PROSTATE SURGERY     TRANSLUMINAL ANGIOPLASTY       A IV Location/Drains/Wounds Patient Lines/Drains/Airways Status     Active Line/Drains/Airways     Name Placement date Placement time Site Days   Peripheral IV 12/04/23 20 G Anterior;Left Forearm 12/04/23  --  Forearm  1            Intake/Output Last 24 hours No intake or output data in the 24 hours ending 12/05/23 0112  Labs/Imaging Results for orders placed or performed during the hospital encounter of 12/04/23 (from the past 48 hour(s))  CBC with Differential     Status: Abnormal   Collection Time: 12/04/23 11:04 PM  Result Value Ref Range   WBC 8.9 4.0 - 10.5 K/uL   RBC 2.98 (L) 4.22 - 5.81 MIL/uL   Hemoglobin 8.3 (L) 13.0 - 17.0 g/dL   HCT 18.8 (L) 41.6 - 60.6 %   MCV 91.6 80.0 - 100.0 fL   MCH 27.9 26.0 - 34.0 pg   MCHC 30.4 30.0 - 36.0 g/dL   RDW 30.1 (H) 60.1 - 09.3 %   Platelets 285 150 - 400 K/uL    Comment: REPEATED TO VERIFY   nRBC 0.0 0.0 - 0.2 %   Neutrophils  Relative % 73 %   Neutro Abs 6.4 1.7 - 7.7 K/uL   Lymphocytes Relative 13 %   Lymphs Abs 1.2 0.7 - 4.0 K/uL   Monocytes Relative 10 %   Monocytes Absolute 0.9 0.1 - 1.0 K/uL   Eosinophils Relative 3 %   Eosinophils Absolute 0.3 0.0 - 0.5 K/uL   Basophils Relative 1 %   Basophils Absolute 0.1 0.0 - 0.1 K/uL   Immature Granulocytes 0 %   Abs Immature Granulocytes 0.03 0.00 - 0.07 K/uL    Comment: Performed at [facility], [address], [place], [place] [address]  Basic metabolic panel     Status: Abnormal   Collection Time: 12/04/23 11:04 PM  Result Value Ref Range   Sodium 139 135 - 145 mmol/L   Potassium 5.5 (H) 3.5 - 5.1 mmol/L   Chloride 115 (H) 98 - 111 mmol/L   CO2 14 (L) 22 - 32 mmol/L   Glucose, Bld 118 (H) 70 - 99 mg/dL    Comment: Glucose  reference range applies only to samples taken after fasting for at least 8 hours.   BUN 75 (H) 8 - 23 mg/dL   Creatinine, Ser 6.04 (H) 0.61 - 1.24 mg/dL   Calcium 9.2 8.9 - 54.0 mg/dL   GFR, Estimated 24 (L) >60 mL/min    Comment: (NOTE) Calculated using the CKD-EPI Creatinine Equation (2021)    Anion gap 10 5 - 15    Comment: Performed at [facility], [address], [place], [place] [address]  CK     Status: None   Collection Time: 12/04/23 11:04 PM  Result Value Ref Range   Total CK 252 49 - 397 U/L    Comment: Performed at [facility], [address] [place], [place] [address]   CT CERVICAL SPINE WO CONTRAST  Result Date: 12/04/2023 CLINICAL DATA:  Status post fall. EXAM: CT CERVICAL SPINE WITHOUT CONTRAST TECHNIQUE: Multidetector CT imaging of the cervical spine was performed without intravenous contrast. Multiplanar CT image reconstructions were also generated. RADIATION DOSE REDUCTION: This exam was performed according to the departmental dose-optimization program which includes automated exposure control, adjustment of the mA and/or kV according to patient size and/or use of iterative reconstruction technique. COMPARISON:  None Available. FINDINGS: Alignment: Normal. Skull base and vertebrae: No acute fracture. No primary bone lesion or focal pathologic process. Soft tissues and spinal canal: No prevertebral fluid or swelling. No visible canal hematoma. Disc levels: Mild to moderate severity multilevel endplate sclerosis, moderate to marked severity anterior osteophyte formation and moderate to marked severity posterior bony spurring are seen throughout all levels of the cervical spine. There is marked severity narrowing of the anterior and lateral axial articulation. Moderate severity intervertebral disc space narrowing is seen at the levels of C4-C5, C5-C6 and C6-C7. Bilateral marked severity multilevel facet joint hypertrophy is noted. Upper chest: A 2.5 mm posterior left  apical noncalcified lung nodule is seen (axial CT image 76, CT series 4). This is stable in size and appearance when compared to the patient's most recent chest CT (April 11, 2023). Other: None. IMPRESSION: 1. No acute fracture or subluxation of the cervical spine. 2. Moderate to marked severity multilevel degenerative changes throughout the cervical spine. 3. 2.5 mm posterior left apical noncalcified lung nodule. Electronically Signed   By: Aram Candela M.D.   On: 12/04/2023 23:52   CT HEAD WO CONTRAST  Result Date: 12/04/2023 CLINICAL DATA:  Status post fall. EXAM:  CT HEAD WITHOUT CONTRAST TECHNIQUE: Contiguous axial images were obtained from the base of the skull through the vertex without intravenous contrast. RADIATION DOSE REDUCTION: This exam was performed according to the departmental dose-optimization program which includes automated exposure control, adjustment of the mA and/or kV according to patient size and/or use of iterative reconstruction technique. COMPARISON:  December 04, 2023 (10:36 a.m.) FINDINGS: Brain: There is moderate severity cerebral atrophy with widening of the extra-axial spaces and ventricular dilatation. There are areas of decreased attenuation within the white matter tracts of the supratentorial brain, consistent with microvascular disease changes. Chronic bilateral basal ganglia lacunar infarcts are noted. Vascular: No hyperdense vessel or unexpected calcification. Skull: A chronic right-sided nasal bone fracture is noted. Sinuses/Orbits: No acute finding. Other: None. IMPRESSION: 1. Generalized cerebral atrophy with chronic white matter small vessel ischemic changes. 2. No acute intracranial abnormality. Electronically Signed   By: Aram Candela M.D.   On: 12/04/2023 23:36   DG Chest Port 1 View  Result Date: 12/04/2023 CLINICAL DATA:  Status post fall. EXAM: PORTABLE CHEST 1 VIEW COMPARISON:  None Available. FINDINGS: The heart size and mediastinal contours are  within normal limits. There is mild, stable elevation of the right hemidiaphragm. Both lungs are clear. The visualized skeletal structures are unremarkable. IMPRESSION: No active cardiopulmonary disease. Electronically Signed   By: Aram Candela M.D.   On: 12/04/2023 23:31   CT HEAD WO CONTRAST ( )  Result Date: 12/04/2023 CLINICAL DATA:  Head trauma, minor (Age >= 65y); Neck trauma (Age >= 65y). EXAM: CT HEAD WITHOUT CONTRAST CT CERVICAL SPINE WITHOUT CONTRAST TECHNIQUE: Multidetector CT imaging of the head and cervical spine was performed following the standard protocol without intravenous contrast. Multiplanar CT image reconstructions of the cervical spine were also generated. RADIATION DOSE REDUCTION: This exam was performed according to the departmental dose-optimization program which includes automated exposure control, adjustment of the mA and/or kV according to patient size and/or use of iterative reconstruction technique. COMPARISON:  CT scan head from 04/11/2023. FINDINGS: CT HEAD FINDINGS Brain: No evidence of acute infarction, hemorrhage, hydrocephalus, extra-axial collection or mass lesion/mass effect. There is bilateral periventricular hypodensity, which is non-specific but most likely seen in the settings of microvascular ischemic changes. Moderate in extent. Otherwise normal appearance of brain parenchyma. Ventricles are normal. Cerebral volume is age appropriate. Vascular: No hyperdense vessel or unexpected calcification. Intracranial arteriosclerosis. Skull: Normal. Negative for fracture or focal lesion. Sinuses/Orbits: No acute finding. Other: Visualized mastoid air cells are unremarkable. No mastoid effusion. CT CERVICAL SPINE FINDINGS Alignment: Normal. This examination does not assess for ligamentous injury or stability. Skull base and vertebrae: No acute fracture. No primary bone lesion or focal pathologic process. Soft tissues and spinal canal: No prevertebral fluid or swelling. No  visible canal hematoma. Disc levels: Mild-to-moderately reduced disc height of C4-5 through C7-T1 levels. Is mild multilevel facet arthropathy and moderate multilevel marginal osteophyte formation. Upper chest: Negative. Other: None. IMPRESSION: *No acute intracranial abnormality. *No acute osseous injury or traumatic listhesis of the cervical spine. Electronically Signed   By: Jules Schick M.D.   On: 12/04/2023 11:34   CT Cervical Spine Wo Contrast  Result Date: 12/04/2023 CLINICAL DATA:  Head trauma, minor (Age >= 65y); Neck trauma (Age >= 65y). EXAM: CT HEAD WITHOUT CONTRAST CT CERVICAL SPINE WITHOUT CONTRAST TECHNIQUE: Multidetector CT imaging of the head and cervical spine was performed following the standard protocol without intravenous contrast. Multiplanar CT image reconstructions of the cervical spine were also generated. RADIATION DOSE REDUCTION:  This exam was performed according to the departmental dose-optimization program which includes automated exposure control, adjustment of the mA and/or kV according to patient size and/or use of iterative reconstruction technique. COMPARISON:  CT scan head from 04/11/2023. FINDINGS: CT HEAD FINDINGS Brain: No evidence of acute infarction, hemorrhage, hydrocephalus, extra-axial collection or mass lesion/mass effect. There is bilateral periventricular hypodensity, which is non-specific but most likely seen in the settings of microvascular ischemic changes. Moderate in extent. Otherwise normal appearance of brain parenchyma. Ventricles are normal. Cerebral volume is age appropriate. Vascular: No hyperdense vessel or unexpected calcification. Intracranial arteriosclerosis. Skull: Normal. Negative for fracture or focal lesion. Sinuses/Orbits: No acute finding. Other: Visualized mastoid air cells are unremarkable. No mastoid effusion. CT CERVICAL SPINE FINDINGS Alignment: Normal. This examination does not assess for ligamentous injury or stability. Skull base and  vertebrae: No acute fracture. No primary bone lesion or focal pathologic process. Soft tissues and spinal canal: No prevertebral fluid or swelling. No visible canal hematoma. Disc levels: Mild-to-moderately reduced disc height of C4-5 through C7-T1 levels. Is mild multilevel facet arthropathy and moderate multilevel marginal osteophyte formation. Upper chest: Negative. Other: None. IMPRESSION: *No acute intracranial abnormality. *No acute osseous injury or traumatic listhesis of the cervical spine. Electronically Signed   By: Jules Schick M.D.   On: 12/04/2023 11:34   DG Pelvis 1-2 Views  Result Date: 12/04/2023 CLINICAL DATA:  Status post fall from standing. EXAM: PELVIS - 1-2 VIEW COMPARISON:  10/01/2012 FINDINGS: No evidence for an acute fracture. SI joints and symphysis pubis unremarkable. Degenerative changes are noted in the hips, left greater than right. Heterotopic ossification in the region of the lesser trochanter of the left femoral neck is compatible with chronic/repetitive soft tissue injury. Evidence of previous ventral mesh placement noted over the lower pelvis. IMPRESSION: 1. No acute bony findings. 2. Degenerative changes in the hips, left greater than right. Electronically Signed   By: Kennith Center M.D.   On: 12/04/2023 11:26   DG Chest 1 View  Result Date: 12/04/2023 CLINICAL DATA:  fall. EXAM: CHEST  1 VIEW COMPARISON:  04/11/2023. FINDINGS: Low lung volume. Bilateral lung fields are clear. Note is made of elevated right hemidiaphragm. Bilateral costophrenic angles are clear. Stable cardio-mediastinal silhouette. No acute osseous abnormalities. The soft tissues are within normal limits. IMPRESSION: No active disease. Electronically Signed   By: Jules Schick M.D.   On: 12/04/2023 11:26    Pending Labs Unresulted Labs (From admission, onward)     Start     Ordered   12/05/23 0500  Comprehensive metabolic panel  Tomorrow morning,   R        12/05/23 0046   12/05/23 0500  CBC   Tomorrow morning,   R        12/05/23 0046   12/05/23 0046  TSH  Once,   R        12/05/23 0046   12/05/23 0046  Hemoglobin A1c  Once,   R        12/05/23 0046   12/05/23 0044  CBC  (heparin)  Once,   R       Comments: Baseline for heparin therapy IF NOT ALREADY DRAWN.  Notify MD if PLT < 100 K.    12/05/23 0046   12/05/23 0044  Creatinine, serum  (heparin)  Once,   R       Comments: Baseline for heparin therapy IF NOT ALREADY DRAWN.    12/05/23 0046   12/04/23 2312  Urinalysis,  Routine w reflex microscopic -Urine, Clean Catch  Once,   URGENT       Question:  Specimen Source  Answer:  Urine, Clean Catch   12/04/23 2311            Vitals/Pain Today's Vitals   12/04/23 2317 12/04/23 2322 12/04/23 2330 12/05/23 0015  BP:   112/68 (!) 118/46  Pulse:      Resp:   (!) 24 17  Temp:      TempSrc:      SpO2: 96%     Weight:  59.8 kg    Height:  5\' 8"  (1.727 m)    PainSc:        Isolation Precautions No active isolations  Medications Medications  heparin injection 5,000 Units (has no administration in time range)  0.9 %  sodium chloride infusion ( Intravenous New Bag/Given 12/05/23 0104)  acetaminophen (TYLENOL) tablet 650 mg (has no administration in time range)    Or  acetaminophen (TYLENOL) suppository 650 mg (has no administration in time range)  ondansetron (ZOFRAN) tablet 4 mg (has no administration in time range)    Or  ondansetron (ZOFRAN) injection 4 mg (has no administration in time range)  albuterol (PROVENTIL) (2.5 MG/3ML) 0.083% nebulizer solution 2.5 mg (has no administration in time range)  sodium chloride 0.9 % bolus 1,000 mL (0 mLs Intravenous Stopped 12/05/23 0050)    Mobility walks     Focused Assessments Renal Assessment Handoff:  Hemodialysis Schedule:  Last Hemodialysis date and time:    Restricted appendage:    R Recommendations: See Admitting Provider Note  Report given to:   Additional Notes: Patient is a fall risk, he has had  multiple falls today, assisted living stated that he sundown. Patient has been able to respond to my questions.

## 2023-12-05 NOTE — Progress Notes (Signed)
Transition of Care [facility]) - CAGE-AID Screening   Patient Details  Name: Jon Smith MRN: [id number] Date of Birth: 02/18/1928  [facility] CM/SW Contact:    Katha Hamming, RN Phone Number: 12/05/2023, 2:36 AM   CAGE-AID Screening:    Have You Ever Felt You Ought to Cut Down on Your Drinking or Drug Use?: No Have People Annoyed You By Critizing Your Drinking Or Drug Use?: No Have You Felt Bad Or Guilty About Your Drinking Or Drug Use?: No Have You Ever Had a Drink or Used Drugs First Thing In The Morning to Steady Your Nerves or to Get Rid of a Hangover?: No CAGE-AID Score: 0  Substance Abuse Education Offered: No

## 2023-12-05 NOTE — Progress Notes (Signed)
Attempted to transfer patient back to bed 2 hours after being put in the chair. Patient unable to assist, even with two people helping. Patient unable help enough to use the Steady either. Staff did a stand/pivot using 3 people with maximum assistance.

## 2023-12-05 NOTE — Evaluation (Signed)
Occupational Therapy Evaluation Patient Details Name: Jon Smith MRN: [id number] DOB: 05/12/1928 Today's Date: 12/05/2023   History of Present Illness 89 y.o. male presents to [facility] from [place] on 12/04/23 after falling multiple times in one day with earlier admit to [facility]. Admitted w/ AKI on CKD. PMH: CKD, HTN, HLD, PrCA, anemia, PAD, back pain   Clinical Impression   Pt admitted with the above diagnosis and has the deficits outlined below. Pt would benefit from cont OT to attempt to reach his baseline level of functioning in assisted living which he reports as occasional min assist with dressing and bathing. Pt states he can toilet, feed and groom  himself at his ALF. Pt would benefit from HHOT at d/c to help pt achieve baseline.       If plan is discharge home, recommend the following: A little help with walking and/or transfers;A lot of help with bathing/dressing/bathroom;Assist for transportation;Help with stairs or ramp for entrance    Functional Status Assessment  Patient has had a recent decline in their functional status and demonstrates the ability to make significant improvements in function in a reasonable and predictable amount of time.  Equipment Recommendations  None recommended by OT    Recommendations for Other Services       Precautions / Restrictions Precautions Precautions: Fall Restrictions Weight Bearing Restrictions: No      Mobility Bed Mobility Overal bed mobility: Needs Assistance Bed Mobility: Supine to Sit     Supine to sit: Min assist     General bed mobility comments: MinA for trunk elevation    Transfers Overall transfer level: Needs assistance Equipment used: None Transfers: Bed to chair/wheelchair/BSC     Squat pivot transfers: Max assist       General transfer comment: Mod-MaxA for squat pivot to recliner, pt unable to reach standing due to limited knee ext. CGA for lateral scoots at EOB      Balance Overall balance  assessment: Needs assistance Sitting-balance support: Feet supported, Bilateral upper extremity supported Sitting balance-Leahy Scale: Poor Sitting balance - Comments: requires MaxA-CGA posteriorly for support at EOB. Needs cues to lean forwards Postural control: Posterior lean     Standing balance comment: Not observed                           ADL either performed or assessed with clinical judgement   ADL Overall ADL's : Needs assistance/impaired Eating/Feeding: Set up;Sitting   Grooming: Set up;Sitting   Upper Body Bathing: Set up;Sitting   Lower Body Bathing: Moderate assistance;Sitting/lateral leans;Bed level   Upper Body Dressing : Set up;Sitting   Lower Body Dressing: Moderate assistance;Sitting/lateral leans;Cueing for compensatory techniques   Toilet Transfer: Maximal assistance;Squat-pivot;BSC/3in1   Toileting- Clothing Manipulation and Hygiene: Sitting/lateral lean;Moderate assistance;Cueing for compensatory techniques       Functional mobility during ADLs: Moderate assistance General ADL Comments: limited by not being able to stand. Does most adls in wc or bed level.     Vision Baseline Vision/History: 1 Wears glasses Ability to See in Adequate Light: 0 Adequate Patient Visual Report: No change from baseline Vision Assessment?: Yes Eye Alignment: Impaired (comment) Ocular Range of Motion: Restricted on the right Alignment/Gaze Preference: Within Defined Limits Tracking/Visual Pursuits: Impaired - to be further tested in functional context Visual Fields: Right visual field deficit     Perception Perception: Within Functional Limits       Praxis Praxis: [facility]  Pertinent Vitals/Pain Pain Assessment Pain Assessment: Faces Faces Pain Scale: Hurts little more Pain Location: back Pain Descriptors / Indicators: Aching Pain Intervention(s): Monitored during session, Repositioned     Extremity/Trunk Assessment Upper Extremity  Assessment Upper Extremity Assessment: Generalized weakness   Lower Extremity Assessment Lower Extremity Assessment: Defer to PT evaluation   Cervical / Trunk Assessment Cervical / Trunk Assessment: Kyphotic   Communication Communication Communication: Hearing impairment Following commands: Follows one step commands consistently Cueing Techniques: Verbal cues;Tactile cues   Cognition Arousal: Alert Behavior During Therapy: WFL for tasks assessed/performed Overall Cognitive Status: History of cognitive impairments - at baseline                                 General Comments: not oriented to date and does not remember falling     General Comments  Pt does not fully stand at baseline.    Exercises     Shoulder Instructions      Home Living Family/patient expects to be discharged to:: Skilled nursing facility                             Home Equipment: Wheelchair - manual;BSC/3in1;Shower seat   Additional Comments: pt performs lateral scoots to/from [facility] w/ supervision at baseline. Does not use RW      Prior Functioning/Environment Prior Level of Function : Needs assist;History of Falls (last six months)  Cognitive Assist : ADLs (cognitive)   ADLs (Cognitive): Intermittent cues Physical Assist : Mobility (physical);ADLs (physical) Mobility (physical): Transfers ADLs (physical): Bathing;Dressing;Toileting;IADLs Mobility Comments: supervision for transfers at facility ADLs Comments: occasion assist with dressing, toileting and bathing. Pt states he does most adls on his own with very little assist.        OT Problem List: Decreased strength;Decreased range of motion;Decreased activity tolerance;Decreased safety awareness;Impaired balance (sitting and/or standing);Decreased coordination      OT Treatment/Interventions: Self-care/ADL training;Therapeutic exercise;Energy conservation;Therapeutic activities    OT Goals(Current goals can be  found in the care plan section) Acute Rehab OT Goals Patient Stated Goal: to get back home OT Goal Formulation: With patient Time For Goal Achievement: 12/19/23 Potential to Achieve Goals: Good ADL Goals Pt Will Perform Eating: Independently;sitting Pt Will Perform Grooming: Independently;sitting Pt Will Perform Lower Body Bathing: with supervision;bed level;sitting/lateral leans Pt Will Perform Lower Body Dressing: with supervision;bed level;sitting/lateral leans Additional ADL Goal #1: Pt will laterally scoot onto drop arm BSC and complete toileting including clothing management with min assist.  OT Frequency: Min 1X/week    Co-evaluation PT/OT/SLP Co-Evaluation/Treatment: Yes Reason for Co-Treatment: For patient/therapist safety;To address functional/ADL transfers PT goals addressed during session: Balance;Mobility/safety with mobility OT goals addressed during session: ADL's and self-care      AM-PAC OT "6 Clicks" Daily Activity     Outcome Measure Help from another person eating meals?: A Little Help from another person taking care of personal grooming?: A Little Help from another person toileting, which includes using toliet, bedpan, or urinal?: A Lot Help from another person bathing (including washing, rinsing, drying)?: A Lot Help from another person to put on and taking off regular upper body clothing?: A Little Help from another person to put on and taking off regular lower body clothing?: A Lot 6 Click Score: 15   End of Session Nurse Communication: Mobility status  Activity Tolerance: Patient tolerated treatment well Patient left: in chair;with call bell/phone within  reach;with chair alarm set  OT Visit Diagnosis: Muscle weakness (generalized) (M62.81)                Time: 1020-1050 OT Time Calculation (min): 30 min Charges:  OT General Charges $OT Visit: 1 Visit OT Evaluation $OT Eval Moderate Complexity: 1 Mod  Hope Budds 12/05/2023, 11:56 AM

## 2023-12-06 DIAGNOSIS — E860 Dehydration: Secondary | ICD-10-CM

## 2023-12-06 DIAGNOSIS — I10 Essential (primary) hypertension: Secondary | ICD-10-CM

## 2023-12-06 DIAGNOSIS — R911 Solitary pulmonary nodule: Secondary | ICD-10-CM

## 2023-12-06 DIAGNOSIS — N1832 Chronic kidney disease, stage 3b: Secondary | ICD-10-CM

## 2023-12-06 DIAGNOSIS — N179 Acute kidney failure, unspecified: Secondary | ICD-10-CM | POA: Diagnosis not present

## 2023-12-06 LAB — URINALYSIS, ROUTINE W REFLEX MICROSCOPIC
Bilirubin Urine: NEGATIVE
Glucose, UA: NEGATIVE mg/dL
Ketones, ur: NEGATIVE mg/dL
Nitrite: NEGATIVE
Protein, ur: 30 mg/dL — AB
Specific Gravity, Urine: 1.019 (ref 1.005–1.030)
WBC, UA: >50 WBC/hpf (ref 0–5)
pH: 5 (ref 5.0–8.0)

## 2023-12-06 LAB — CBC
HCT: 26.9 % — ABNORMAL LOW (ref 39.0–52.0)
Hemoglobin: 8.4 g/dL — ABNORMAL LOW (ref 13.0–17.0)
MCH: 28.4 pg (ref 26.0–34.0)
MCHC: 31.2 g/dL (ref 30.0–36.0)
MCV: 90.9 fL (ref 80.0–100.0)
Platelets: 298 10*3/uL (ref 150–400)
RBC: 2.96 MIL/uL — ABNORMAL LOW (ref 4.22–5.81)
RDW: 17.6 % — ABNORMAL HIGH (ref 11.5–15.5)
WBC: 9.6 10*3/uL (ref 4.0–10.5)
nRBC: 0 % (ref 0.0–0.2)

## 2023-12-06 LAB — BASIC METABOLIC PANEL
Anion gap: 8 (ref 5–15)
BUN: 44 mg/dL — ABNORMAL HIGH (ref 8–23)
CO2: 13 mmol/L — ABNORMAL LOW (ref 22–32)
Calcium: 8.4 mg/dL — ABNORMAL LOW (ref 8.9–10.3)
Chloride: 118 mmol/L — ABNORMAL HIGH (ref 98–111)
Creatinine, Ser: 1.83 mg/dL — ABNORMAL HIGH (ref 0.61–1.24)
GFR, Estimated: 34 mL/min — ABNORMAL LOW (ref >60)
Glucose, Bld: 81 mg/dL (ref 70–99)
Potassium: 4.6 mmol/L (ref 3.5–5.1)
Sodium: 139 mmol/L (ref 135–145)

## 2023-12-06 LAB — TYPE AND SCREEN
ABO/RH(D): A POS
Antibody Screen: NEGATIVE
Unit division: 0

## 2023-12-06 LAB — BPAM RBC
Blood Product Expiration Date: 202412272359
ISSUE DATE / TIME: 202412061320
Unit Type and Rh: 6200

## 2023-12-06 NOTE — Progress Notes (Signed)
Occupational Therapy Treatment Patient Details Name: Taegen Molesky MRN: [id number] DOB: [birth year] Today's Date: 12/06/2023   History of present illness 89 y.o. male presents to [facility] from [place] on 12/04/23 after falling multiple times in one day with earlier admit to [facility]. Admitted w/ AKI on CKD. PMH: CKD, HTN, HLD, PrCA, anemia, PAD, back pain   OT comments  Pt c/o no pain or discomfort, was noted to have mild labored breathing throughout session, VSS. Pt needed max A for supine to sit on EOB to power sitting up and scooting to the edge. Completed LB dressing at EOB mod A, min A for UB dressing. Not able to stand today from elevated surface and max effort, not able to lift off the bed. Pt able to laterally scoot up the bed with CGA. Pt would benefit from postacute rehab <3hrs/day to improve with functional independence and transfers, will continue to follow acutely.       If plan is discharge home, recommend the following:  A lot of help with bathing/dressing/bathroom;Assist for transportation;Help with stairs or ramp for entrance;A lot of help with walking and/or transfers;Assistance with cooking/housework   Equipment Recommendations  None recommended by OT    Recommendations for Other Services      Precautions / Restrictions Precautions Precautions: Fall Restrictions Weight Bearing Restrictions: No       Mobility Bed Mobility Overal bed mobility: Needs Assistance Bed Mobility: Rolling, Supine to Sit, Sit to Supine Rolling: Min assist   Supine to sit: Max assist, HOB elevated, Used rails Sit to supine: Contact guard assist   General bed mobility comments: max A for supine to sit for sitting up and scooting to EOB.    Transfers Overall transfer level: Needs assistance Equipment used: Rolling walker (2 wheels) Transfers: Sit to/from Stand Sit to Stand: Total assist, From elevated surface          Lateral/Scoot Transfers: Contact guard assist General transfer  comment: was not able to stand from elevated surface after multiple attempts and max effort. Pt wasa ble to laterally scoot up the bed with CGA.     Balance Overall balance assessment: Needs assistance Sitting-balance support: Feet supported, Bilateral upper extremity supported Sitting balance-Leahy Scale: Poor       Standing balance-Leahy Scale: Zero                             ADL either performed or assessed with clinical judgement   ADL Overall ADL's : Needs assistance/impaired Eating/Feeding: Set up;Sitting   Grooming: Set up;Sitting           Upper Body Dressing : Minimal assistance;Sitting   Lower Body Dressing: Moderate assistance;Sitting/lateral leans   Toilet Transfer: Total assistance             General ADL Comments: Pt mod A for LB dressing, min A for UB dressing, was not able to stand today with multiple attempts or from elevated surface.    Extremity/Trunk Assessment Upper Extremity Assessment Upper Extremity Assessment: Generalized weakness            Vision       Perception     Praxis      Cognition Arousal: Alert Behavior During Therapy: WFL for tasks assessed/performed Overall Cognitive Status: History of cognitive impairments - at baseline  General Comments: Pt states it's december 1922, recalls prior living arrangements, not able to recall why he is here fully, abel to follow directions and respond appropriately.        Exercises      Shoulder Instructions       General Comments      Pertinent Vitals/ Pain       Pain Assessment Pain Assessment: No/denies pain  Home Living                                          Prior Functioning/Environment              Frequency  Min 1X/week        Progress Toward Goals  OT Goals(current goals can now be found in the care plan section)  Progress towards OT goals: Progressing toward  goals  Acute Rehab OT Goals Patient Stated Goal: to improve strength OT Goal Formulation: With patient Time For Goal Achievement: 12/19/23 Potential to Achieve Goals: Good ADL Goals Pt Will Perform Eating: Independently;sitting Pt Will Perform Grooming: Independently;sitting Pt Will Perform Lower Body Bathing: with supervision;bed level;sitting/lateral leans Pt Will Perform Lower Body Dressing: with supervision;bed level;sitting/lateral leans Pt Will Transfer to Toilet: with contact guard assist;bedside commode Additional ADL Goal #1: Pt will laterally scoot onto drop arm BSC and complete toileting including clothing management with min assist.  Plan      Co-evaluation                 AM-PAC OT "6 Clicks" Daily Activity     Outcome Measure   Help from another person eating meals?: A Little Help from another person taking care of personal grooming?: A Little Help from another person toileting, which includes using toliet, bedpan, or urinal?: A Lot   Help from another person to put on and taking off regular upper body clothing?: A Little Help from another person to put on and taking off regular lower body clothing?: A Lot 6 Click Score: 13    End of Session Equipment Utilized During Treatment: Gait belt;Rolling walker (2 wheels)  OT Visit Diagnosis: Muscle weakness (generalized) (M62.81)   Activity Tolerance Patient tolerated treatment well   Patient Left in bed;with call bell/phone within reach;with bed alarm set   Nurse Communication Mobility status        Time: 1610-9604 OT Time Calculation (min): 28 min  Charges: OT General Charges $OT Visit: 1 Visit OT Treatments $Self Care/Home Management : 8-22 mins $Therapeutic Activity: 8-22 mins  Sanjuanita Condrey, OTR/L   Nolah Krenzer R Emilina Smarr 12/06/2023, 1:27 PM

## 2023-12-06 NOTE — Plan of Care (Signed)
  Problem: Activity: Goal: Risk for activity intolerance will decrease Outcome: Progressing   Problem: Pain Management: Goal: General experience of comfort will improve Outcome: Progressing

## 2023-12-06 NOTE — Progress Notes (Signed)
[facility]                                                                              Jon Smith, is a 89 y.o. male, DOB - May 23, 1928, ZOX:[id number] Admit date - 12/04/2023    Outpatient Primary MD for the patient is [name], NP  LOS - 1  days  Chief Complaint  Patient presents with   Fall    Fall on thinners       Brief summary   Patient is a 89 year old male with HLD, PAD, prostate CA status posttreatment, hypertension, anemia, chronic back pain, dementia, CKD presented to ED from ALF with multiple falls.  Patient was seen in ED on 11/30/2023 for AKI, altered mental status.  Patient presented to [facility] ED on 12/5 with a fall, CT head was negative for SDH or IPH, reassuring blood work and was discharged back to facility.  Patient returned back to [facility] ED 12 hours later with 3 falls within an hour.  He has a history of sundowning. In ED, temp 96.7 F, RR 18, HR 88  BP 128/60, O2 sats 96% on room air Labs showed sodium 141, CO2 12, BUN 58, creatinine 1.84 Hemoglobin 7.2  Assessment & Plan    Principal Problem: Acute kidney injury on CKD stage IIIb GFR 30-44 ml/min (HCC) -Baseline creatinine 1.6-1.9, creatinine was 2.3 on 11/30/2023 -Presented with creatinine of 1.8 on 12/6, trended up to 2.1 -Labs pending today, continue IV fluid hydration, -Renal ultrasound showed no hydronephrosis, 1 cm calculus in the mid left kidney collecting system -UA and culture still pending, ordered twice  Active Problems: Acute on chronic anemia, anemia of chronic disease -Anemia profile shows ferritin 27, iron 16, TIBC 286, percent saturation ratio 6 -Hb 7.2, baseline ~8  -No evidence of GI bleeding -Received 1 unit of packed RBCs on 12/6, IV iron x 1  -Awaiting CBC today   Folic acid deficiency -Folate 5.7, placed on folic acid 1 mg daily -Continue B12   Essential hypertension -BP currently stable, hold lisinopril due to AKI -Resume metoprolol, 12.5 mg  twice daily  Recurrent falls, generalized debility, worsening dementia - patient is currently at ALF, has history of dementia and sundowning, has frequent falls  -PT eval-> SNF -Will consult palliative medicine for goals of care  BPH -Continue tamsulosin, finasteride  Hyperlipidemia -Continue statin  Prostate CA status post treatment -In remission    Dementia (HCC) -Continue Zoloft  Lung nodule -CT scan C-spine showed 2.5 mm posterior left apical noncalcified lung nodule, stable in size and appearance when compared to recent CT chest in April 2024    Pressure injury of skin -Right buttock stage II, POA -Scrotum lower stage II, POA  Estimated body mass index is 20.05 kg/m as calculated from the following:   Height as of this encounter: 5\' 8"  (1.727 m).   Weight as of this encounter: 59.8 kg.  Code Status: Full CODE STATUS DVT Prophylaxis:  heparin injection 5,000 Units Start: 12/05/23 0600   Level of Care: Level of care: Telemetry Medical Family Communication: Updated patient's son and wife on phone on 12/6 Disposition Plan:  Remains inpatient appropriate:   Discussed in detail with the patient's family, they feel that he needs a higher level of care and is not thriving in the assisted living facility environment with recurrent falls.   Procedures:    Consultants:     Antimicrobials:   Anti-infectives (From admission, onward)    None          Medications  cyanocobalamin  1,000 mcg Oral Daily   finasteride  5 mg Oral Daily   folic acid  1 mg Oral Daily   heparin  5,000 Units Subcutaneous Q8H   latanoprost  1 drop Both Eyes QHS   metoprolol tartrate  12.5 mg Oral BID   sertraline  50 mg Oral Daily   tamsulosin  0.4 mg Oral Daily      Subjective:   Jon Smith was seen and examined today.  No acute complaints per patient.  Appears comfortable.  Has dementia, difficult to obtain ROS from the patient.  No acute events overnight   Objective:    Vitals:   12/05/23 1615 12/05/23 2058 12/05/23 2059 12/06/23 0805  BP: (!) 151/64 (!) 141/56  (!) 138/58  Pulse: 75 80 77   Resp: 18  20   Temp: 97.9 F (36.6 C)   99.7 F (37.6 C)  TempSrc: Oral   Oral  SpO2: 100%  100%   Weight:      Height:        Intake/Output Summary (Last 24 hours) at 12/06/2023 1231 Last data filed at 12/06/2023 0522 Gross per 24 hour  Intake 614 ml  Output 600 ml  Net 14 ml     Wt Readings from Last 3 Encounters:  12/04/23 59.8 kg  12/04/23 59.8 kg  11/29/23 59.8 kg   Physical Exam General: Oriented to self, NAD, confused Cardiovascular: S1 S2 clear, RRR.  Respiratory: CTAB, no wheezing Gastrointestinal: Soft, nontender, nondistended, NBS Ext: no pedal edema bilaterally Psych: dementia   Data Reviewed:  I have personally reviewed following labs    CBC Lab Results  Component Value Date   WBC 8.1 12/05/2023   RBC 2.60 (L) 12/05/2023   RBC 2.55 (L) 12/05/2023   HGB 7.2 (L) 12/05/2023   HCT 23.6 (L) 12/05/2023   MCV 90.8 12/05/2023   MCH 27.7 12/05/2023   PLT 310 12/05/2023   MCHC 30.5 12/05/2023   RDW 17.2 (H) 12/05/2023   LYMPHSABS 1.2 12/04/2023   MONOABS 0.9 12/04/2023   EOSABS 0.3 12/04/2023   BASOSABS 0.1 12/04/2023     Last metabolic panel Lab Results  Component Value Date   NA 138 12/05/2023   K 4.4 12/05/2023   CL 116 (H) 12/05/2023   CO2 16 (L) 12/05/2023   BUN 66 (H) 12/05/2023   CREATININE 2.13 (H) 12/05/2023   GLUCOSE 100 (H) 12/05/2023   GFRNONAA 28 (L) 12/05/2023   GFRAA 38 (L) 04/02/2020   CALCIUM 8.1 (L) 12/05/2023   PHOS 3.0 11/28/2023   PROT 4.6 (L) 12/05/2023   ALBUMIN 2.1 (L) 12/05/2023   BILITOT 0.3 12/05/2023   ALKPHOS 39 12/05/2023   AST 16 12/05/2023   ALT 21 12/05/2023   ANIONGAP 6 12/05/2023    CBG (last 3)  Recent Labs    12/04/23 1242  GLUCAP 85      Coagulation Profile: No results for input(s): "INR", "PROTIME" in the last 168 hours.   Radiology Studies: I have  personally reviewed the imaging studies  US RENAL  Result Date: 12/06/2023 CLINICAL DATA:  Renal  failure EXAM: RENAL / URINARY TRACT ULTRASOUND COMPLETE COMPARISON:  03/17/2021 FINDINGS: Right Kidney: Renal measurements: 9.5 x 4.8 x 5.3 cm = volume: 124.28 mL. Echogenicity within normal limits. No mass or hydronephrosis visualized. Left Kidney: Renal measurements: 8.1 x 5.2 x 5.4 cm = volume: 118.97 mL. 1 cm calculus identified within the mid left kidney collecting system. Adjacent cyst is noted measuring 3.2 by 3.5 x 3.7 cm. No suspicious mass or hydronephrosis. Parenchymal echogenicity is within normal limits. Bladder: Appears normal for degree of bladder distention. Other: None. IMPRESSION: 1. No acute findings. No hydronephrosis. 2. 1 cm calculus within the mid left kidney collecting system. 3. 3.7 cm left kidney cyst. No specific follow-up imaging recommended. Electronically Signed   By: Signa Kell M.D.   On: 12/06/2023 09:22   CT CERVICAL SPINE WO CONTRAST  Result Date: 12/04/2023 CLINICAL DATA:  Status post fall. EXAM: CT CERVICAL SPINE WITHOUT CONTRAST TECHNIQUE: Multidetector CT imaging of the cervical spine was performed without intravenous contrast. Multiplanar CT image reconstructions were also generated. RADIATION DOSE REDUCTION: This exam was performed according to the departmental dose-optimization program which includes automated exposure control, adjustment of the mA and/or kV according to patient size and/or use of iterative reconstruction technique. COMPARISON:  None Available. FINDINGS: Alignment: Normal. Skull base and vertebrae: No acute fracture. No primary bone lesion or focal pathologic process. Soft tissues and spinal canal: No prevertebral fluid or swelling. No visible canal hematoma. Disc levels: Mild to moderate severity multilevel endplate sclerosis, moderate to marked severity anterior osteophyte formation and moderate to marked severity posterior bony spurring are seen  throughout all levels of the cervical spine. There is marked severity narrowing of the anterior and lateral axial articulation. Moderate severity intervertebral disc space narrowing is seen at the levels of C4-C5, C5-C6 and C6-C7. Bilateral marked severity multilevel facet joint hypertrophy is noted. Upper chest: A 2.5 mm posterior left apical noncalcified lung nodule is seen (axial CT image 76, CT series 4). This is stable in size and appearance when compared to the patient's most recent chest CT (April 11, 2023). Other: None. IMPRESSION: 1. No acute fracture or subluxation of the cervical spine. 2. Moderate to marked severity multilevel degenerative changes throughout the cervical spine. 3. 2.5 mm posterior left apical noncalcified lung nodule. Electronically Signed   By: Aram Candela M.D.   On: 12/04/2023 23:52   CT HEAD WO CONTRAST  Result Date: 12/04/2023 CLINICAL DATA:  Status post fall. EXAM: CT HEAD WITHOUT CONTRAST TECHNIQUE: Contiguous axial images were obtained from the base of the skull through the vertex without intravenous contrast. RADIATION DOSE REDUCTION: This exam was performed according to the departmental dose-optimization program which includes automated exposure control, adjustment of the mA and/or kV according to patient size and/or use of iterative reconstruction technique. COMPARISON:  December 04, 2023 (10:36 a.m.) FINDINGS: Brain: There is moderate severity cerebral atrophy with widening of the extra-axial spaces and ventricular dilatation. There are areas of decreased attenuation within the white matter tracts of the supratentorial brain, consistent with microvascular disease changes. Chronic bilateral basal ganglia lacunar infarcts are noted. Vascular: No hyperdense vessel or unexpected calcification. Skull: A chronic right-sided nasal bone fracture is noted. Sinuses/Orbits: No acute finding. Other: None. IMPRESSION: 1. Generalized cerebral atrophy with chronic white matter small  vessel ischemic changes. 2. No acute intracranial abnormality. Electronically Signed   By: Aram Candela M.D.   On: 12/04/2023 23:36   DG Chest Port 1 View  Result Date: 12/04/2023 CLINICAL DATA:  Status post fall. EXAM: PORTABLE CHEST 1 VIEW COMPARISON:  None Available. FINDINGS: The heart size and mediastinal contours are within normal limits. There is mild, stable elevation of the right hemidiaphragm. Both lungs are clear. The visualized skeletal structures are unremarkable. IMPRESSION: No active cardiopulmonary disease. Electronically Signed   By: Aram Candela M.D.   On: 12/04/2023 23:31       Vidit Boissonneault M.D. [facility] 12/06/2023, 12:31 PM  Available via Epic secure chat 7am-7pm After 7 pm, please refer to night coverage provider listed on amion.

## 2023-12-06 NOTE — Plan of Care (Signed)
  Problem: Health Behavior/Discharge Planning: Goal: Ability to manage health-related needs will improve Outcome: Progressing   Problem: Clinical Measurements: Goal: Will remain free from infection Outcome: Progressing   Problem: Clinical Measurements: Goal: Respiratory complications will improve Outcome: Progressing   

## 2023-12-07 DIAGNOSIS — F0150 Vascular dementia without behavioral disturbance: Secondary | ICD-10-CM

## 2023-12-07 DIAGNOSIS — Z7189 Other specified counseling: Secondary | ICD-10-CM | POA: Diagnosis not present

## 2023-12-07 DIAGNOSIS — E860 Dehydration: Secondary | ICD-10-CM | POA: Diagnosis not present

## 2023-12-07 DIAGNOSIS — F0390 Unspecified dementia without behavioral disturbance: Secondary | ICD-10-CM

## 2023-12-07 DIAGNOSIS — Z515 Encounter for palliative care: Secondary | ICD-10-CM

## 2023-12-07 DIAGNOSIS — N179 Acute kidney failure, unspecified: Secondary | ICD-10-CM | POA: Diagnosis not present

## 2023-12-07 DIAGNOSIS — R911 Solitary pulmonary nodule: Secondary | ICD-10-CM | POA: Diagnosis not present

## 2023-12-07 LAB — BASIC METABOLIC PANEL
Anion gap: 5 (ref 5–15)
BUN: 38 mg/dL — ABNORMAL HIGH (ref 8–23)
CO2: 16 mmol/L — ABNORMAL LOW (ref 22–32)
Calcium: 8.5 mg/dL — ABNORMAL LOW (ref 8.9–10.3)
Chloride: 119 mmol/L — ABNORMAL HIGH (ref 98–111)
Creatinine, Ser: 1.81 mg/dL — ABNORMAL HIGH (ref 0.61–1.24)
GFR, Estimated: 34 mL/min — ABNORMAL LOW (ref >60)
Glucose, Bld: 99 mg/dL (ref 70–99)
Potassium: 4.6 mmol/L (ref 3.5–5.1)
Sodium: 140 mmol/L (ref 135–145)

## 2023-12-07 LAB — CBC
HCT: 26.6 % — ABNORMAL LOW (ref 39.0–52.0)
Hemoglobin: 8.4 g/dL — ABNORMAL LOW (ref 13.0–17.0)
MCH: 28.7 pg (ref 26.0–34.0)
MCHC: 31.6 g/dL (ref 30.0–36.0)
MCV: 90.8 fL (ref 80.0–100.0)
Platelets: 290 10*3/uL (ref 150–400)
RBC: 2.93 MIL/uL — ABNORMAL LOW (ref 4.22–5.81)
RDW: 17.4 % — ABNORMAL HIGH (ref 11.5–15.5)
WBC: 9.2 10*3/uL (ref 4.0–10.5)
nRBC: 0 % (ref 0.0–0.2)

## 2023-12-07 LAB — C DIFFICILE QUICK SCREEN W PCR REFLEX
C Diff antigen: NEGATIVE
C Diff interpretation: NOT DETECTED
C Diff toxin: NEGATIVE

## 2023-12-07 MED ORDER — SODIUM CHLORIDE 0.9 % IV SOLN
1.0000 g | INTRAVENOUS | Status: DC
  Administered 2023-12-07 – 2023-12-10 (×4): 1 g via INTRAVENOUS
  Filled 2023-12-07 (×4): qty 10

## 2023-12-07 MED ORDER — SODIUM BICARBONATE 650 MG PO TABS
650.0000 mg | ORAL_TABLET | Freq: Three times a day (TID) | ORAL | Status: DC
  Administered 2023-12-07 – 2023-12-12 (×16): 650 mg via ORAL
  Filled 2023-12-07 (×16): qty 1

## 2023-12-07 NOTE — Plan of Care (Signed)
  Problem: Clinical Measurements: Goal: Respiratory complications will improve Outcome: Progressing   Problem: Activity: Goal: Risk for activity intolerance will decrease Outcome: Progressing   Problem: Nutrition: Goal: Adequate nutrition will be maintained Outcome: Progressing   

## 2023-12-07 NOTE — Consult Note (Signed)
Palliative Care Consult Note                                  Date: 12/07/2023   Patient Name: Jon Smith  DOB: [birth year]  MRN: [id number]  Age / Sex: 89 y.o., male  PCP: [name], NP Referring Physician: Cathren Harsh, MD  Reason for Consultation: {Reason for Consult:23484}  HPI/Patient Profile: 89 y.o. male  with past medical history of dementia, PAD, anemia, CKD stage IIIb, chronic back pain, prostate cancer s/p treatment, hypertension, and hyperlipidemia who initially presented to [facility] ED on 12/04/2023 after a fall.  CT head was negative for bleed, lab work was reassuring, and he was discharged back to his facility.  He returned to [facility] ED 12 hours later after 3 additional falls. He was admitted with AKI on CKD and acute on chronic anemia.  Palliative Medicine was consulted for goals of care and medical decision making.  Past Medical History:  Diagnosis Date   Back pain    Elevated LFTs    Hepatitis    HLD (hyperlipidemia)    Hypertension    Incomplete bladder emptying    Lower extremity ulceration (HCC)    Nocturia    Peripheral arterial disease (HCC)    Prostate cancer (HCC) remote   Cryotherapy by Dr. Orson Slick.  PSA 5.54 in September at [facility]   Renal stones     Subjective:   I have reviewed medical records including EPIC notes, labs and imaging, received report from the team, and assessed the patient at bedside.   I met with *** to discuss diagnosis, prognosis, GOC, EOL wishes, disposition, and options.  I introduced Palliative Medicine as specialized medical care for people living with serious illness. It focuses on providing relief from the symptoms and stress of a serious illness.   We discussed patient's current illness and what it means in the larger context of his/her ongoing co-morbidities. Current clinical status was reviewed. Natural disease trajectory of *** was discussed.  Created space and  opportunity for patient and family to explore thoughts and feelings regarding current medical situation.  Values and goals of care important to patient and family were attempted to be elicited.  A discussion was had today regarding advanced directives. Concepts specific to code status, artifical feeding and hydration, continued IV antibiotics and rehospitalization was had.  The MOST form was introduced and discussed.  Questions and concerns addressed. Patient/family encouraged to call with questions or concerns.     Life Review: ***  Functional Status: ***  Patient/Family Understanding of Illness: ***  Patient Values: ***  Goals: ***  Additional Discussion: ***  Review of Systems  Objective:   Primary Diagnoses: Present on Admission:  Acute renal failure (HCC)  Dementia (HCC)  Pressure injury of skin  CKD stage 3b, GFR 30-44 ml/min (HCC)  Fall  Essential hypertension   Physical Exam  Vital Signs:  BP (!) 149/61 (BP Location: Right Arm)   Pulse 71   Temp 98.7 F (37.1 C) (Oral)   Resp 18   Ht 5\' 8"  (1.727 m)   Wt 59.9 kg   SpO2 98%   BMI 20.08 kg/m   Palliative Assessment/Data: ***     Assessment & Plan:   SUMMARY OF RECOMMENDATIONS   Code status changed to DNR/DNI Continue current support interventions  Primary Decision Maker: {Primary Decision GEXBM:84132}  Code Status/Advance Care Planning: {Palliative Code status:23503}  Symptom Management:  ***  Prognosis:  {Palliative Care Prognosis:23504}  Discharge Planning:  {Palliative dispostion:23505}   Discussed with: ***    Thank you for allowing [place] to participate in the care of [name]   Time Total: ***  Greater than 50%  of this time was spent counseling and coordinating care related to the above assessment and plan.  Signed by: Sherlean Foot, NP Palliative Medicine Team  Team Phone # [phone]  For individual providers, please see  AMION

## 2023-12-07 NOTE — Progress Notes (Signed)
[facility]                                                                              Clendon Lindemann, is a 89 y.o. male, DOB - Nov 21, 1928, ZOX:[id number] Admit date - 12/04/2023    Outpatient Primary MD for the patient is [name], NP  LOS - 2  days  Chief Complaint  Patient presents with   Fall    Fall on thinners       Brief summary   Patient is a 89 year old male with HLD, PAD, prostate CA status posttreatment, hypertension, anemia, chronic back pain, dementia, CKD presented to ED from ALF with multiple falls.  Patient was seen in ED on 11/30/2023 for AKI, altered mental status.  Patient presented to [facility] ED on 12/5 with a fall, CT head was negative for SDH or IPH, reassuring blood work and was discharged back to facility.  Patient returned back to [facility] ED 12 hours later with 3 falls within an hour.  He has a history of sundowning. In ED, temp 96.7 F, RR 18, HR 88  BP 128/60, O2 sats 96% on room air Labs showed sodium 141, CO2 12, BUN 58, creatinine 1.84 Hemoglobin 7.2  Assessment & Plan    Principal Problem: Acute kidney injury on CKD stage IIIb GFR 30-44 ml/min (HCC), NAG metabolic acidosis -Baseline creatinine 1.6-1.9, creatinine was 2.3 on 11/30/2023 -Presented with creatinine of 1.8 on 12/6, trended up to 2.1 -Renal ultrasound showed no hydronephrosis, 1 cm calculus in the mid left kidney collecting system -Patient was placed on IV fluids, creatinine now stable at 1.8, at baseline -Started on sodium bicarb 650 mg 3 times daily   Active Problems: Acute on chronic anemia, anemia of chronic disease -Anemia profile shows ferritin 27, iron 16, TIBC 286, percent saturation ratio 6 -Hb 7.2, baseline ~8  -No evidence of GI bleeding -Received 1 unit of packed RBCs on 12/6, IV iron x 1    UTI urinary tract infection -UA positive for large leukocytes, rare bacteria, WBCs > 50, cloudy -Urine sample was not reflexed, will add on for culture and  sensitivities -Placed on IV Rocephin   Folic acid deficiency -Folate 5.7, placed on folic acid 1 mg daily -Continue B12   Essential hypertension -BP currently stable, hold lisinopril due to AKI -Resume metoprolol, 12.5 mg twice daily  Recurrent falls, generalized debility, worsening dementia - patient is currently at ALF, has history of dementia and sundowning, has frequent falls  -Mental status likely worsened due to UTI -PT eval-> SNF -Will consult palliative medicine for goals of care  BPH -Continue tamsulosin, finasteride  Hyperlipidemia -Continue statin  Prostate CA status post treatment -In remission    Dementia (HCC) -Continue Zoloft  Lung nodule -CT scan C-spine showed 2.5 mm posterior left apical noncalcified lung nodule, stable in size and appearance when compared to recent CT chest in April 2024    Pressure injury of skin -Right buttock stage II, POA -Scrotum lower stage II, POA  Estimated body mass index is 20.08 kg/m as calculated from the following:   Height as of this encounter: 5\' 8"  (1.727 m).  Weight as of this encounter: 59.9 kg.  Code Status: Full CODE STATUS DVT Prophylaxis:  heparin injection 5,000 Units Start: 12/05/23 0600   Level of Care: Level of care: Telemetry Medical Family Communication: Updated patient's son and wife on phone on 12/6 Disposition Plan:      Remains inpatient appropriate:   Discussed in detail with the patient's family, they feel that he needs a higher level of care and is not thriving in the assisted living facility environment with recurrent falls.   Procedures:    Consultants:     Antimicrobials:   Anti-infectives (From admission, onward)    None          Medications  cyanocobalamin  1,000 mcg Oral Daily   finasteride  5 mg Oral Daily   folic acid  1 mg Oral Daily   heparin  5,000 Units Subcutaneous Q8H   latanoprost  1 drop Both Eyes QHS   metoprolol tartrate  12.5 mg Oral BID   sertraline   50 mg Oral Daily   sodium bicarbonate  650 mg Oral TID   tamsulosin  0.4 mg Oral Daily      Subjective:   Javarous Matulewicz was seen and examined today.  No acute complaints.  Appears comfortable.  No acute complaints per patient.  No fever chills, chest pain, shortness of breath, nausea or vomiting.    Objective:   Vitals:   12/06/23 1412 12/06/23 2202 12/07/23 0552 12/07/23 0801  BP: (!) 142/55 133/61 136/64 (!) 149/61  Pulse: 71 73 68 71  Resp: 16 16  18   Temp: 98.2 F (36.8 C) 98.4 F (36.9 C) 98.1 F (36.7 C) 98.7 F (37.1 C)  TempSrc: Oral Oral Oral Oral  SpO2: 99% 100% 100% 98%  Weight:   59.9 kg   Height:        Intake/Output Summary (Last 24 hours) at 12/07/2023 1255 Last data filed at 12/07/2023 0554 Gross per 24 hour  Intake 480 ml  Output 1100 ml  Net -620 ml     Wt Readings from Last 3 Encounters:  12/07/23 59.9 kg  12/04/23 59.8 kg  11/29/23 59.8 kg   Physical Exam General: Alert and oriented x self, NAD, comfortable Cardiovascular: S1 S2 clear, RRR.  Respiratory: CTAB, no wheezing, rales or rhonchi Gastrointestinal: Soft, nontender, nondistended, NBS Ext: no pedal edema bilaterally Neuro: no new deficits Psych: has dementia, calm and cooperative   Data Reviewed:  I have personally reviewed following labs    CBC Lab Results  Component Value Date   WBC 9.2 12/07/2023   RBC 2.93 (L) 12/07/2023   HGB 8.4 (L) 12/07/2023   HCT 26.6 (L) 12/07/2023   MCV 90.8 12/07/2023   MCH 28.7 12/07/2023   PLT 290 12/07/2023   MCHC 31.6 12/07/2023   RDW 17.4 (H) 12/07/2023   LYMPHSABS 1.2 12/04/2023   MONOABS 0.9 12/04/2023   EOSABS 0.3 12/04/2023   BASOSABS 0.1 12/04/2023     Last metabolic panel Lab Results  Component Value Date   NA 140 12/07/2023   K 4.6 12/07/2023   CL 119 (H) 12/07/2023   CO2 16 (L) 12/07/2023   BUN 38 (H) 12/07/2023   CREATININE 1.81 (H) 12/07/2023   GLUCOSE 99 12/07/2023   GFRNONAA 34 (L) 12/07/2023   GFRAA 38 (L)  04/02/2020   CALCIUM 8.5 (L) 12/07/2023   PHOS 3.0 11/28/2023   PROT 4.6 (L) 12/05/2023   ALBUMIN 2.1 (L) 12/05/2023   BILITOT 0.3 12/05/2023   ALKPHOS  39 12/05/2023   AST 16 12/05/2023   ALT 21 12/05/2023   ANIONGAP 5 12/07/2023    CBG (last 3)  No results for input(s): "GLUCAP" in the last 72 hours.     Coagulation Profile: No results for input(s): "INR", "PROTIME" in the last 168 hours.   Radiology Studies: I have personally reviewed the imaging studies  US RENAL  Result Date: 12/06/2023 CLINICAL DATA:  Renal failure EXAM: RENAL / URINARY TRACT ULTRASOUND COMPLETE COMPARISON:  03/17/2021 FINDINGS: Right Kidney: Renal measurements: 9.5 x 4.8 x 5.3 cm = volume: 124.28 mL. Echogenicity within normal limits. No mass or hydronephrosis visualized. Left Kidney: Renal measurements: 8.1 x 5.2 x 5.4 cm = volume: 118.97 mL. 1 cm calculus identified within the mid left kidney collecting system. Adjacent cyst is noted measuring 3.2 by 3.5 x 3.7 cm. No suspicious mass or hydronephrosis. Parenchymal echogenicity is within normal limits. Bladder: Appears normal for degree of bladder distention. Other: None. IMPRESSION: 1. No acute findings. No hydronephrosis. 2. 1 cm calculus within the mid left kidney collecting system. 3. 3.7 cm left kidney cyst. No specific follow-up imaging recommended. Electronically Signed   By: Signa Kell M.D.   On: 12/06/2023 09:22       Eryck Negron M.D. [facility] 12/07/2023, 12:55 PM  Available via Epic secure chat 7am-7pm After 7 pm, please refer to night coverage provider listed on amion.

## 2023-12-07 NOTE — Consult Note (Incomplete)
Palliative Care Consult Note                                  Date: 12/07/2023   Patient Name: Jon Smith  DOB: 07-06-1928  MRN: [id number]  Age / Sex: 89 y.o., male  PCP: [name], NP Referring Physician: Cathren Harsh, MD  Reason for Consultation: Establishing goals of care  HPI/Patient Profile: 89 y.o. male  with past medical history of dementia, PAD, anemia, CKD stage IIIb, chronic back pain, prostate cancer s/p treatment, hypertension, and hyperlipidemia who initially presented to [facility] ED on 12/04/2023 after a fall.  CT head was negative for bleed, lab work was reassuring, and he was discharged back to his facility.  He returned to [facility] ED 12 hours later after 3 additional falls. He was admitted with AKI on CKD and acute on chronic anemia.  Palliative Medicine was consulted for goals of care and medical decision making.   Subjective:   I have reviewed medical records including EPIC notes, labs and imaging, and assessed the patient at bedside. He is alert, currently feeding himself, no complaints. He is oriented to person and place. He is able to tell me he fell prior to being brought to the hospital.   I spoke with his son/Clarence by phone to discuss diagnosis, prognosis, GOC, EOL wishes, disposition, and options.  I introduced Palliative Medicine as specialized medical care for people living with serious illness. It focuses on providing relief from the symptoms and stress of a serious illness.   Created space and opportunity for son to express thoughts and feelings regarding current medical situation. Values and goals of care were attempted to be elicited.  Life Review: Patient is originally from [place], although spent some time living in [place]. He retired from [employer]. He is separated. He has 4 children (from 2?) relationships, although 2 of them live out of state and are not involved. Another son Smitty Cords) lives  locally but is "not reliable" according to [name] due to substance issues.   Functional Status: Patient lives at [facility] the [facility] [address]  He has lived there for approximately 4 years.  Son reports there has been a decline in patient's functional status over the past few months.  At baseline, patient was able to transfer (without physical assistance) from bed to wheelchair to go to the cafeteria.  Today's Discussion: We discussed patient's current illness and what it means in the larger context of his ongoing co-morbidities. Current clinical status was reviewed. Natural disease trajectory of dementia was discussed, emphasizing that it is progressive and noncurable.  Discussed the "what ifs" and encouraged son to consider what medical interventions patient would or would not want in the event his condition were to deteriorate, keeping in mind the concept of quality of life.    Discussed code status. I encouraged consideration of DNR/DNI status and provided education on evidence-based poor outcomes in similar hospitalized patients, as the cause of cardiac arrest would likely be associated with advanced illness rather than a reversible condition. Son agrees that DNR/DNI status is appropriate. Although they never explicitly discussed his wishes, son knows his father and feels he would not want to "go through that".  Son has a lot of questions/concerns around disposition. Discussed current recommendation from PT/OT is for rehab, which will provide opportunity for improvement of functional status to the extent this is possible.  Discussed that  patient may not be able to return to ALF after rehab due to needing a higher level of care.  Son  reports there is memory care available at the [facility], but that it is currently full.  I discussed with son my recommendation to consider the addition of hospice support at his facility (either ALF or higher level care). I explained that hospice would be  extra care for patient, communication with family, and assistance with symptom management and care when he further declines. Son understands and will consider.  Questions/concerns addressed. PMT contact info and "hard choices" book left at bedside for son to review.    Review of Systems  All other systems reviewed and are negative.   Objective:   Primary Diagnoses: Present on Admission: . Acute renal failure (HCC) . Dementia (HCC) . Pressure injury of skin . CKD stage 3b, GFR 30-44 ml/min (HCC) . Fall . Essential hypertension   Physical Exam Vitals reviewed.  Constitutional:      General: He is not in acute distress.    Comments: Frail, elderly  Pulmonary:     Effort: Pulmonary effort is normal.  Neurological:     Mental Status: He is alert.     Comments: Oriented to person, place  Psychiatric:        Cognition and Memory: Cognition is impaired. Memory is impaired.    Palliative Assessment/Data: PPS 40%     Assessment & Plan:   SUMMARY OF RECOMMENDATIONS   Code status changed to DNR/DNI Continue current support interventions   Primary Decision Maker: {Primary Decision WUJWJ:19147}  Code Status/Advance Care Planning: {Palliative Code status:23503}  Symptom Management:  ***  Prognosis:  {Palliative Care Prognosis:23504}  Discharge Planning:  {Palliative dispostion:23505}   Discussed with: ***    Thank you for allowing [place] to participate in the care of [name]   Time Total: ***  Greater than 50%  of this time was spent counseling and coordinating care related to the above assessment and plan.  Signed by: Sherlean Foot, NP Palliative Medicine Team  Team Phone # [phone]  For individual providers, please see AMION

## 2023-12-07 NOTE — Plan of Care (Signed)
  Problem: Activity: Goal: Risk for activity intolerance will decrease Outcome: Progressing   Problem: Nutrition: Goal: Adequate nutrition will be maintained Outcome: Progressing   Problem: Pain Management: Goal: General experience of comfort will improve Outcome: Progressing

## 2023-12-08 DIAGNOSIS — N179 Acute kidney failure, unspecified: Secondary | ICD-10-CM | POA: Diagnosis not present

## 2023-12-08 DIAGNOSIS — R531 Weakness: Secondary | ICD-10-CM

## 2023-12-08 DIAGNOSIS — R911 Solitary pulmonary nodule: Secondary | ICD-10-CM | POA: Diagnosis not present

## 2023-12-08 DIAGNOSIS — E860 Dehydration: Secondary | ICD-10-CM | POA: Diagnosis not present

## 2023-12-08 LAB — BASIC METABOLIC PANEL
Anion gap: 7 (ref 5–15)
BUN: 35 mg/dL — ABNORMAL HIGH (ref 8–23)
CO2: 17 mmol/L — ABNORMAL LOW (ref 22–32)
Calcium: 8.3 mg/dL — ABNORMAL LOW (ref 8.9–10.3)
Chloride: 111 mmol/L (ref 98–111)
Creatinine, Ser: 1.73 mg/dL — ABNORMAL HIGH (ref 0.61–1.24)
GFR, Estimated: 36 mL/min — ABNORMAL LOW (ref >60)
Glucose, Bld: 99 mg/dL (ref 70–99)
Potassium: 4.7 mmol/L (ref 3.5–5.1)
Sodium: 135 mmol/L (ref 135–145)

## 2023-12-08 MED ORDER — HALOPERIDOL LACTATE 5 MG/ML IJ SOLN: 1.0000 mg | Freq: Once | INTRAMUSCULAR | Status: DC | PRN

## 2023-12-08 NOTE — Consult Note (Signed)
[facility] Liaison Consult Note\   12/08/2023  Jon Smith 12/12/1928 [id number]  Insurance: [employer] Medicare    Primary Care Provider: [name], NP, with [facility] is not a [facility] affiliated provider   [facility] Liaison met patient at bedside at [facility]. No family in the room.  Patient in bed asleep.    The patient was screened for 7 day readmission hospitalization with noted extreme risk score for unplanned readmission risk 2 ED and 2 hospital admissions in 6 months.  The patient was assessed for potential Community Care Coordination service needs for post hospital transition for care coordination. Review of patient's electronic medical record reveals patient is noted with dementia from an ALF.Marland Kitchen   Plan: Patient is for LTC for memory care, no VBCI follow up     For questions contact:   Charlesetta Shanks, RN, BSN, CCM [place]  [facility] Liaison Direct Dial: [phone] or secure chat Email: [email or web address]

## 2023-12-08 NOTE — Progress Notes (Signed)
SLP Cancellation Note  Patient Details Name: Jon Smith MRN: [id number] DOB: 1928/09/23   Cancelled treatment:       Reason Eval/Treat Not Completed: Other (comment) Progression of dementia already being addressed appropriately by palliative care team. No need for SLP cognitive eval this admission. Will d/c orders.    Mehgan Santmyer, Riley Nearing 12/08/2023, 2:53 PM

## 2023-12-08 NOTE — Plan of Care (Signed)
  Problem: Education: Goal: Knowledge of General Education information will improve Description: Including pain rating scale, medication(s)/side effects and non-pharmacologic comfort measures Outcome: Not Progressing   Problem: Health Behavior/Discharge Planning: Goal: Ability to manage health-related needs will improve Outcome: Not Progressing   Problem: Safety: Goal: Ability to remain free from injury will improve Outcome: Progressing   

## 2023-12-08 NOTE — Care Management Important Message (Signed)
Important Message  Patient Details  Name: Horst Stidham MRN: [id number] Date of Birth: 1928-04-11   Important Message Given:  Yes - Medicare IM     Sherilyn Banker 12/08/2023, 4:13 PM

## 2023-12-08 NOTE — TOC Initial Note (Signed)
Transition of Care [facility]) - Initial/Assessment Note    Patient Details  Name: Jon Smith MRN: [id number] Date of Birth: [birth year]  Transition of Care [facility]) CM/SW Contact:    Lorri Frederick, LCSW Phone Number: 12/08/2023, 11:52 AM  Clinical Narrative:        Pt oriented x2, CSW attempted to speak with him: pt in bed with sheet over his head, would not remove it, not much response to CSW efforts to engage.  CSW spoke with son [name] regarding PT recommendation for SNF.  [name] provided background: [place] from [place] [facility] [place] [facility], they have been talking on and off about move to memory care, but no openings and  pt had also improved somewhat prior to this admission.  Pt was at [facility] several years ago, [name] would be open to return there or other SNF options, but would like higher rated facility if possible.   Referral sent out to [place] facilities.             Expected Discharge Plan: Skilled Nursing Facility Barriers to Discharge: Continued Medical Work up, SNF Pending bed offer   Patient Goals and CMS Choice     Choice offered to / list presented to : Adult Children (son [name])      Expected Discharge Plan and Services In-house Referral: Clinical Social Work   Post Acute Care Choice: Skilled Nursing Facility Living arrangements for the past 2 months: [facility]isted Living Facility ([facility] [place] [facility]                                      Prior Living Arrangements/Services Living arrangements for the past 2 months: Assisted Living Facility ([facility] [place] [facility] Lives with:: Facility Resident Patient language and need for interpreter reviewed:: Yes        Need for Family Participation in Patient Care: Yes (Comment) Care giver support system in place?: Yes (comment) Current home services: Other (comment) (na) Criminal Activity/Legal Involvement Pertinent to Current Situation/Hospitalization: No - Comment as needed  Activities of Daily Living    ADL Screening (condition at time of admission) Independently performs ADLs?: No Does the patient have a NEW difficulty with bathing/dressing/toileting/self-feeding that is expected to last >3 days?: Yes (Initiates electronic notice to provider for possible OT consult) Does the patient have a NEW difficulty with getting in/out of bed, walking, or climbing stairs that is expected to last >3 days?: Yes (Initiates electronic notice to provider for possible PT consult) Does the patient have a NEW difficulty with communication that is expected to last >3 days?: Yes (Initiates electronic notice to provider for possible SLP consult) Is the patient deaf or have difficulty hearing?: Yes Does the patient have difficulty seeing, even when wearing glasses/contacts?: Yes Does the patient have difficulty concentrating, remembering, or making decisions?: Yes  Permission Sought/Granted                  Emotional Assessment Appearance::  (unable to assess) Attitude/Demeanor/Rapport: Unable to Assess Affect (typically observed): Unable to Assess Orientation: : Oriented to Self, Oriented to Place      Admission diagnosis:  Dehydration [E86.0] Acute renal failure (HCC) [N17.9] Lung nodule [R91.1] AKI (acute kidney injury) (HCC) [N17.9] Patient Active Problem List   Diagnosis Date Noted   Acute renal failure (HCC) 12/05/2023   Dementia (HCC) 12/05/2023   Pressure injury of skin 12/05/2023   Acute on chronic anemia 11/25/2023  CKD stage 3b, GFR 30-44 ml/min (HCC) 11/25/2023   Depression 11/25/2023   UTI (urinary tract infection) 04/11/2023   Altered mental status 04/11/2023   Generalized weakness 04/11/2023   BPH (benign prostatic hyperplasia) 04/11/2023   CVA (cerebral vascular accident) (HCC) 04/11/2023   Dysphagia 04/11/2023   Renovascular hypertension 10/13/2020   Disease characterized by destruction of skeletal muscle 07/25/2020   Elevated AST (SGOT) 07/25/2020   Fall 07/25/2020    Hyperkalemia 07/25/2020   Urinary retention 07/25/2020   Vitamin B12 deficiency 08/20/2019   Generalized muscle weakness 08/18/2019   Asymptomatic proteinuria 02/22/2019   Vitamin D deficiency 02/22/2019   Chronic anemia 02/21/2019   High risk medication use 02/21/2019   Scalp hematoma 04/28/2018   Urine test positive for microalbuminuria 08/27/2017   Neck pain 07/01/2017   DDD (degenerative disc disease), cervical 07/01/2017   Prostate cancer (HCC) 01/16/2016   Elevated PSA 01/16/2016   Protein-calorie malnutrition, severe 12/12/2015   Gross hematuria    Acute hepatitis 12/08/2015   Elevated LFTs    Abnormal liver function tests 12/06/2015   H/O malignant neoplasm of prostate 09/18/2015   Hypercholesteremia 05/17/2014   Essential hypertension 05/17/2014   Photodermatitis due to sun 05/17/2014   PCP:  [name], NP Pharmacy:   [facility] [place], [place] - [address] [place] [address] Phone: [phone] Fax: [phone]     Social Determinants of Health (SDOH) Social History: SDOH Screenings   Food Insecurity: Patient Unable To Answer (11/25/2023)  Housing: Patient Unable To Answer (04/11/2023)  Transportation Needs: No Transportation Needs (11/25/2023)  Utilities: Not At Risk (11/25/2023)  Financial Resource Strain: High Risk (07/25/2020)   Received from [facility]  Tobacco Use: Medium Risk (12/05/2023)   SDOH Interventions:     Readmission Risk Interventions     No data to display

## 2023-12-08 NOTE — Plan of Care (Signed)
  Problem: Activity: Goal: Risk for activity intolerance will decrease Outcome: Progressing   Problem: Nutrition: Goal: Adequate nutrition will be maintained Outcome: Progressing   Problem: Coping: Goal: Level of anxiety will decrease Outcome: Progressing   

## 2023-12-08 NOTE — [place] FL2 (Signed)
[place] MEDICAID FL2 LEVEL OF CARE FORM     IDENTIFICATION  Patient Name: Jon Smith Birthdate: 25-Dec-1928 Sex: male Admission Date (Current Location): 12/04/2023  [place] and [place] Number:  Haynes Bast [id number] Facility and Address:  [facility], [address], [place], [place] [address]      Provider Number: [id number]  Attending Physician Name and Address:  Cathren Harsh, MD  Relative Name and Phone Number:  Knowlton, Ennis [phone]  [phone]    Current Level of Care: Hospital Recommended Level of Care: Skilled Nursing Facility Prior Approval Number:    Date Approved/Denied:   PASRR Number: [id number]  Discharge Plan: SNF    Current Diagnoses: Patient Active Problem List   Diagnosis Date Noted   Acute renal failure (HCC) 12/05/2023   Dementia (HCC) 12/05/2023   Pressure injury of skin 12/05/2023   Acute on chronic anemia 11/25/2023   CKD stage 3b, GFR 30-44 ml/min (HCC) 11/25/2023   Depression 11/25/2023   UTI (urinary tract infection) 04/11/2023   Altered mental status 04/11/2023   Generalized weakness 04/11/2023   BPH (benign prostatic hyperplasia) 04/11/2023   CVA (cerebral vascular accident) (HCC) 04/11/2023   Dysphagia 04/11/2023   Renovascular hypertension 10/13/2020   Disease characterized by destruction of skeletal muscle 07/25/2020   Elevated AST (SGOT) 07/25/2020   Fall 07/25/2020   Hyperkalemia 07/25/2020   Urinary retention 07/25/2020   Vitamin B12 deficiency 08/20/2019   Generalized muscle weakness 08/18/2019   Asymptomatic proteinuria 02/22/2019   Vitamin D deficiency 02/22/2019   Chronic anemia 02/21/2019   High risk medication use 02/21/2019   Scalp hematoma 04/28/2018   Urine test positive for microalbuminuria 08/27/2017   Neck pain 07/01/2017   DDD (degenerative disc disease), cervical 07/01/2017   Prostate cancer (HCC) 01/16/2016   Elevated PSA 01/16/2016   Protein-calorie  malnutrition, severe 12/12/2015   Gross hematuria    Acute hepatitis 12/08/2015   Elevated LFTs    Abnormal liver function tests 12/06/2015   H/O malignant neoplasm of prostate 09/18/2015   Hypercholesteremia 05/17/2014   Essential hypertension 05/17/2014   Photodermatitis due to sun 05/17/2014    Orientation RESPIRATION BLADDER Height & Weight     Self, Place  Normal Incontinent Weight: 132 lb 0.9 oz (59.9 kg) Height:  5\' 8"  (172.7 cm)  BEHAVIORAL SYMPTOMS/MOOD NEUROLOGICAL BOWEL NUTRITION STATUS      Incontinent Diet (see discharge summary)  AMBULATORY STATUS COMMUNICATION OF NEEDS Skin   Total Care (wheelchair at baseline) Verbally Skin abrasions                       Personal Care Assistance Level of Assistance  Bathing, Feeding, Dressing Bathing Assistance: Limited assistance Feeding assistance: Limited assistance Dressing Assistance: Limited assistance     Functional Limitations Info  Sight, Hearing, Speech Sight Info: Adequate Hearing Info: Impaired Speech Info: Adequate    SPECIAL CARE FACTORS FREQUENCY  PT (By licensed PT), OT (By licensed OT)     PT Frequency: 5x week OT Frequency: 5x week            Contractures Contractures Info: Not present    Additional Factors Info    Code Status Info: DNR Allergies Info: Aspirin, Sulfamethoxazole-trimethoprim           Current Medications (12/08/2023):  This is the current hospital active medication list Current Facility-Administered Medications  Medication Dose Route Frequency Provider Last Rate Last Admin   acetaminophen (TYLENOL) tablet 650 mg  650 mg  Oral Q6H PRN Lurline Del, MD   650 mg at 12/08/23 0139   Or   acetaminophen (TYLENOL) suppository 650 mg  650 mg Rectal Q6H PRN Lurline Del, MD       albuterol (PROVENTIL) (2.5 MG/3ML) 0.083% nebulizer solution 2.5 mg  2.5 mg Nebulization Q2H PRN Skip Mayer A, MD       cefTRIAXone (ROCEPHIN) 1 g in sodium chloride 0.9 % 100 mL  IVPB  1 g Intravenous Q24H Rai, Ripudeep K, MD 0 mL/hr at 12/07/23 1433 1 g at 12/07/23 1433   cyanocobalamin (VITAMIN B12) tablet 1,000 mcg  1,000 mcg Oral Daily Rai, Ripudeep K, MD   1,000 mcg at 12/08/23 0845   finasteride (PROSCAR) tablet 5 mg  5 mg Oral Daily Rai, Ripudeep K, MD   5 mg at 12/08/23 0845   folic acid (FOLVITE) tablet 1 mg  1 mg Oral Daily Rai, Ripudeep K, MD   1 mg at 12/08/23 0845   haloperidol lactate (HALDOL) injection 1 mg  1 mg Intravenous Once PRN Luiz Iron, NP       heparin injection 5,000 Units  5,000 Units Subcutaneous Q8H Skip Mayer A, MD   5,000 Units at 12/08/23 0844   latanoprost (XALATAN) 0.005 % ophthalmic solution 1 drop  1 drop Both Eyes QHS Rai, Ripudeep K, MD   1 drop at 12/07/23 2315   metoprolol tartrate (LOPRESSOR) tablet 12.5 mg  12.5 mg Oral BID Rai, Ripudeep K, MD   12.5 mg at 12/08/23 0845   ondansetron (ZOFRAN) tablet 4 mg  4 mg Oral Q6H PRN Lurline Del, MD       Or   ondansetron [facility]) injection 4 mg  4 mg Intravenous Q6H PRN Lurline Del, MD       sertraline (ZOLOFT) tablet 50 mg  50 mg Oral Daily Rai, Ripudeep K, MD   50 mg at 12/08/23 0845   sodium bicarbonate tablet 650 mg  650 mg Oral TID Rai, Ripudeep Kirtland Bouchard, MD   650 mg at 12/08/23 0845   tamsulosin (FLOMAX) capsule 0.4 mg  0.4 mg Oral Daily Rai, Ripudeep K, MD   0.4 mg at 12/08/23 0845     Discharge Medications: Please see discharge summary for a list of discharge medications.  Relevant Imaging Results:  Relevant Lab Results:   Additional Information SS #: [id number]  Dossie Der Einar Crow, LCSW

## 2023-12-08 NOTE — Progress Notes (Signed)
[facility]                                                                              Jon Smith, is a 89 y.o. male, DOB - January 14, 1928, ZOX:[id number] Admit date - 12/04/2023    Outpatient Primary MD for the patient is [name], NP  LOS - 3  days  Chief Complaint  Patient presents with   Fall    Fall on thinners       Brief summary   Patient is a 89 year old male with HLD, PAD, prostate CA status posttreatment, hypertension, anemia, chronic back pain, dementia, CKD presented to ED from ALF with multiple falls.  Patient was seen in ED on 11/30/2023 for AKI, altered mental status.  Patient presented to [facility] ED on 12/5 with a fall, CT head was negative for SDH or IPH, reassuring blood work and was discharged back to facility.  Patient returned back to [facility] ED 12 hours later with 3 falls within an hour.  He has a history of sundowning. In ED, temp 96.7 F, RR 18, HR 88  BP 128/60, O2 sats 96% on room air Labs showed sodium 141, CO2 12, BUN 58, creatinine 1.84 Hemoglobin 7.2  Assessment & Plan    Principal Problem: Acute kidney injury on CKD stage IIIb GFR 30-44 ml/min (HCC), NAG metabolic acidosis -Baseline creatinine 1.6-1.9, creatinine was 2.3 on 11/30/2023 -Presented with creatinine of 1.8 on 12/6, trended up to 2.1 -Renal ultrasound showed no hydronephrosis, 1 cm calculus in the mid left kidney collecting system -Received fluids, continue sodium bicarb 650 mg 3 times daily -Creatinine improving, 1.7, at baseline   Active Problems: Acute on chronic anemia, anemia of chronic disease -Anemia profile shows ferritin 27, iron 16, TIBC 286, percent saturation ratio 6 -Hb 7.2, baseline ~8  -No evidence of GI bleeding -Received 1 unit of packed RBCs on 12/6, IV iron x 1  -H&H stable   UTI urinary tract infection, Proteus mirabilis -UA positive for large leukocytes, rare bacteria, WBCs > 50, cloudy -Urine culture growing more than 100,000 colonies  of Proteus mirabilis -Continue IV Rocephin   Folic acid deficiency -Folate 5.7, placed on folic acid 1 mg daily -Continue B12   Essential hypertension -BP currently stable, hold lisinopril due to AKI -Resume metoprolol, 12.5 mg twice daily  Recurrent falls, generalized debility, worsening dementia - patient is currently at ALF, has history of dementia and sundowning, has frequent falls  -Mental status likely worsened due to UTI -PT eval-> SNF -Will consult palliative medicine for goals of care  BPH -Continue tamsulosin, finasteride  Hyperlipidemia -Continue statin  Prostate CA status post treatment -In remission    Dementia (HCC) -Continue Zoloft  Lung nodule -CT scan C-spine showed 2.5 mm posterior left apical noncalcified lung nodule, stable in size and appearance when compared to recent CT chest in April 2024    Pressure injury of skin -Right buttock stage II, POA -Scrotum lower stage II, POA  Estimated body mass index is 20.08 kg/m as calculated from the following:   Height as of this encounter: 5\' 8"  (1.727 m).   Weight as of this  encounter: 59.9 kg.  Code Status: Full CODE STATUS DVT Prophylaxis:  heparin injection 5,000 Units Start: 12/05/23 0600   Level of Care: Level of care: Telemetry Medical Family Communication: Updated patient's son and wife on phone on 12/6 Disposition Plan:      Remains inpatient appropriate:   Discussed in detail with the patient's family, they feel that he needs a higher level of care and is not thriving in the assisted living facility environment with recurrent falls.   Procedures:    Consultants:     Antimicrobials:   Anti-infectives (From admission, onward)    Start     Dose/Rate Route Frequency Ordered Stop   12/07/23 1345  cefTRIAXone (ROCEPHIN) 1 g in sodium chloride 0.9 % 100 mL IVPB        1 g 200 mL/hr over 30 Minutes Intravenous Every 24 hours 12/07/23 1258            Medications  cyanocobalamin   1,000 mcg Oral Daily   finasteride  5 mg Oral Daily   folic acid  1 mg Oral Daily   heparin  5,000 Units Subcutaneous Q8H   latanoprost  1 drop Both Eyes QHS   metoprolol tartrate  12.5 mg Oral BID   sertraline  50 mg Oral Daily   sodium bicarbonate  650 mg Oral TID   tamsulosin  0.4 mg Oral Daily      Subjective:   Jon Smith was seen and examined today.  Appears comfortable.  No acute complaints.  Keeping himself covered with a blanket. Objective:   Vitals:   12/07/23 0552 12/07/23 0801 12/07/23 1332 12/08/23 0934  BP: 136/64 (!) 149/61 (!) 143/56 139/72  Pulse: 68 71 69 65  Resp:  18 18 18   Temp: 98.1 F (36.7 C) 98.7 F (37.1 C) 98 F (36.7 C) 97.8 F (36.6 C)  TempSrc: Oral Oral  Oral  SpO2: 100% 98% 100% 100%  Weight: 59.9 kg     Height:       No intake or output data in the 24 hours ending 12/08/23 1226    Wt Readings from Last 3 Encounters:  12/07/23 59.9 kg  12/04/23 59.8 kg  11/29/23 59.8 kg    Physical exam Patient declined physical exam today, he did not want to remove the blanket over him.    Data Reviewed:  I have personally reviewed following labs    CBC Lab Results  Component Value Date   WBC 9.2 12/07/2023   RBC 2.93 (L) 12/07/2023   HGB 8.4 (L) 12/07/2023   HCT 26.6 (L) 12/07/2023   MCV 90.8 12/07/2023   MCH 28.7 12/07/2023   PLT 290 12/07/2023   MCHC 31.6 12/07/2023   RDW 17.4 (H) 12/07/2023   LYMPHSABS 1.2 12/04/2023   MONOABS 0.9 12/04/2023   EOSABS 0.3 12/04/2023   BASOSABS 0.1 12/04/2023     Last metabolic panel Lab Results  Component Value Date   NA 135 12/08/2023   K 4.7 12/08/2023   CL 111 12/08/2023   CO2 17 (L) 12/08/2023   BUN 35 (H) 12/08/2023   CREATININE 1.73 (H) 12/08/2023   GLUCOSE 99 12/08/2023   GFRNONAA 36 (L) 12/08/2023   GFRAA 38 (L) 04/02/2020   CALCIUM 8.3 (L) 12/08/2023   PHOS 3.0 11/28/2023   PROT 4.6 (L) 12/05/2023   ALBUMIN 2.1 (L) 12/05/2023   BILITOT 0.3 12/05/2023   ALKPHOS 39  12/05/2023   AST 16 12/05/2023   ALT 21 12/05/2023   ANIONGAP  7 12/08/2023    CBG (last 3)  No results for input(s): "GLUCAP" in the last 72 hours.     Coagulation Profile: No results for input(s): "INR", "PROTIME" in the last 168 hours.   Radiology Studies: I have personally reviewed the imaging studies  No results found.     Thad Ranger M.D. [facility] 12/08/2023, 12:26 PM  Available via Epic secure chat 7am-7pm After 7 pm, please refer to night coverage provider listed on amion.

## 2023-12-09 DIAGNOSIS — R911 Solitary pulmonary nodule: Secondary | ICD-10-CM | POA: Diagnosis not present

## 2023-12-09 DIAGNOSIS — E860 Dehydration: Secondary | ICD-10-CM | POA: Diagnosis not present

## 2023-12-09 DIAGNOSIS — N179 Acute kidney failure, unspecified: Secondary | ICD-10-CM | POA: Diagnosis not present

## 2023-12-09 MED ORDER — GERHARDT'S BUTT CREAM
TOPICAL_CREAM | Freq: Every day | CUTANEOUS | Status: DC
  Filled 2023-12-09 (×2): qty 60

## 2023-12-09 NOTE — TOC Progression Note (Addendum)
Transition of Care [facility]) - Progression Note    Patient Details  Name: Jon Smith MRN: [id number] Date of Birth: 01-02-1928  Transition of Care [facility]) CM/SW Contact  Lorri Frederick, LCSW Phone Number: 12/09/2023, 1:28 PM  Clinical Narrative:   CSW spoke with son [name], discussed bed offers.  He is asking about potential LTC after rehab.  He will review offers, planning to be at the hospital around lunchtime.   1300: CSW spoke with son outside pt room.  He checked with [facility], still no memory care openings.  Discussed LTC vs STR at SNF and he does want to discuss LTC with the SNFs.  He is also asking for response from [facility] SNF.  CSW spoke with [place] at [facility], they cannot accept [employer].  CSW spoke/messaged with [facility] and [facility] regarding potential LTC and they are willing to discuss with son once pt is admitted for STR.  This information given to son and he would like to accept offer at [facility].    Confirmed with [facility]. Angela/TOC CMA to start [employer] auth.  Expected Discharge Plan: Skilled Nursing Facility Barriers to Discharge: Continued Medical Work up, SNF Pending bed offer  Expected Discharge Plan and Services In-house Referral: Clinical Social Work   Post Acute Care Choice: Skilled Nursing Facility Living arrangements for the past 2 months: Assisted Living Facility ([facility] [place] [facility]                                       Social Determinants of Health (SDOH) Interventions SDOH Screenings   Food Insecurity: Patient Unable To Answer (11/25/2023)  Housing: Patient Unable To Answer (04/11/2023)  Transportation Needs: No Transportation Needs (11/25/2023)  Utilities: Not At Risk (11/25/2023)  Financial Resource Strain: High Risk (07/25/2020)   [facility]  Tobacco Use: Medium Risk (12/05/2023)    Readmission Risk Interventions     No data to display

## 2023-12-09 NOTE — Plan of Care (Signed)
  Problem: Education: Goal: Knowledge of General Education information will improve Description: Including pain rating scale, medication(s)/side effects and non-pharmacologic comfort measures Outcome: Not Progressing   Problem: Activity: Goal: Risk for activity intolerance will decrease Outcome: Not Progressing   Problem: Nutrition: Goal: Adequate nutrition will be maintained Outcome: Not Progressing   Problem: Pain Management: Goal: General experience of comfort will improve Outcome: Progressing   Problem: Safety: Goal: Ability to remain free from injury will improve Outcome: Progressing

## 2023-12-09 NOTE — H&P (Signed)
History and Physical   Jon Smith WGN:[id number] DOB: 1928-04-04 DOA: 11/25/2023  PCP: [name], NP  Outpatient Specialists: Dr. Apolinar Junes, urology Patient coming from: [facility] via EMS  I have personally briefly reviewed patient's old medical records in [facility] EMR.  Chief Concern: Abnormal labs  HPI: Mr. Jon Smith is a 89 year old male with history of CKD stage IIIb, hypertension, hyperlipidemia, history of prostate cancer, mild dementia, chronic anemia, who presents emergency department from the [facility] for chief concerns of abnormal labs.  Vitals in the ED showed temperature of 98, respiration rate of 18, heart rate of 70, blood pressure 140/54, SpO2 100% on room air.  Serum sodium is 132, potassium 5.6, chloride 107, bicarb 19, BUN 53, serum creatinine 1.71, EGFR 37, nonfasting blood glucose 111, WBC 8.2, hemoglobin 6.8, platelets of 291.  Lactic acid was 2.2.  ED treatment: 1 unit PRBC has been ordered for transfusion. ------------------------------------- At bedside, patient was able to tell me his first and last name, with difficulty he was able to tell me the current calendar year without multiple-choice, he knows he is in the hospital, and he states that he is 89 years old.  And then when I told him that that is incorrect age, he then states he is 89 years old.   Ultimately he was not able to tell me that he is 89 years old.   He denies any discomfort including chest pain, shortness of breath, nausea, vomiting, headache, abdominal pain, dysuria, hematuria, diarrhea.   He states that occasionally he will wake up in the middle of the night and that he has wet himself.   Patient is concerned on whether or not he will return back to the [address] tomorrow.  He would repeatedly asked the same questions over and over again, very consistent with history of dementia.   Social history: [place] is from the [place] [address].   ROS: Unable  to fully complete as patient has dementia   ED Course: Discussed with emergency medicine provider, patient requiring hospitalization for chief concerns of acute on chronic anemia and hypokalemia.   Assessment/Plan   Principal Problem:   Acute on chronic anemia Active Problems:   Chronic anemia   Essential hypertension   BPH (benign prostatic hyperplasia)   Hypercholesteremia   Hyperkalemia   Generalized weakness   CKD stage 3b, GFR 30-44 ml/min (HCC)   Depression   Assessment and Plan:   * Acute on chronic anemia Anemia panel has been ordered, still pending vitamin B12 level Iron and TIBC panel is within normal limits; ferritin level is low at 16; RBC is low at 2.38, immature reticulocyte fraction is 24.3 Continue with 1 unit of PRBC for transfusion Will recheck hemoglobin hematocrit posttransfusion We will hold aspirin and Plavix on admission Nursing order/instruction to recheck hemoglobin/hematocrit and serum potassium level post 1 unit of blood transfusion completion placed Admit to telemetry medical, inpatient   Chronic anemia Baseline hemoglobin over the last year has been 7.9-9.7 Home iron supplementations, ferrous sulfate 325 mg p.o. twice daily resumed on admission   Essential hypertension Metoprolol tartrate 25 mg p.o. twice daily resumed and lisinopril 40 mg daily resumed on admission Hydralazine 5 mg IV every 6 hours as needed for SBP > 170, 5 days ordered   BPH (benign prostatic hyperplasia) Tamsulosin 0.4 mg daily after supper and finasteride 5 mg daily were resumed on admission   Depression Home sertraline 50 mg daily resumed   CKD  stage 3b, GFR 30-44 ml/min (HCC) At baseline   Generalized weakness Fall precaution, PT, OT were consulted on admission   Hyperkalemia Holding home lisinopril on admission Recheck serum potassium post blood transfusion Recheck BMP in the a.m.   Hypercholesteremia Atorvastatin 40 mg nightly resumed   Chart reviewed.     DVT prophylaxis: TED hose; AM team to initiate pharmacologic DVT prophylaxis when the benefits outweigh the risk Code Status: Full code Diet: Heart healthy Family Communication: Updated son Jon Smith at [phone]. I answered questions that he had and explained the reason for the admission. I also explained that I am admitting his father, Mr. Jon Smith and that another doctor will take over his care tomorrow. Disposition Plan: Pending clinical course Consults called: PT, OT Admission status: Telemetry medical, inpatient  Past Medical History:  Diagnosis Date   Back pain    Elevated LFTs    Hepatitis    HLD (hyperlipidemia)    Hypertension    Incomplete bladder emptying    Lower extremity ulceration (HCC)    Nocturia    Peripheral arterial disease (HCC)    Prostate cancer (HCC) remote   Cryotherapy by Dr. Orson Slick.  PSA 5.54 in September at [facility]   Renal stones    Past Surgical History:  Procedure Laterality Date   AORTOGRAM     PERITONEAL CATHETER INSERTION     PROSTATE SURGERY     TRANSLUMINAL ANGIOPLASTY     Social History:  reports that he has quit smoking. His smoking use included cigarettes. He has never used smokeless tobacco. He reports that he does not drink alcohol and does not use drugs.  Allergies  Allergen Reactions   Aspirin Nausea And Vomiting   Sulfamethoxazole-Trimethoprim     Other reaction(s): Blood Disorder Coagulopathy   Family History  Problem Relation Age of Onset   Heart disease Brother    Kidney Stones Sister    Heart attack Sister    Heart attack Brother    Kidney disease Neg Hx    Prostate cancer Neg Hx    Family history: Family history reviewed and not pertinent  Prior to Admission medications   Medication Sig Start Date End Date Taking? Authorizing Provider  amoxicillin-clavulanate (AUGMENTIN) 875-125 MG tablet Take 1 tablet by mouth 2 (two) times daily. 11/10/23  Yes [provider]  ASPIRIN LOW DOSE 81 MG EC tablet  Take 81 mg by mouth daily. 03/26/21  Yes [provider]  atorvastatin (LIPITOR) 40 MG tablet Take 40 mg by mouth daily. 10/12/20  Yes [provider]  clopidogrel (PLAVIX) 75 MG tablet Take by mouth. 08/02/20  Yes [provider]  ferrous sulfate 325 (65 FE) MG EC tablet Take 1 tablet by mouth in the morning and at bedtime. 02/26/21  Yes [provider]  finasteride (PROSCAR) 5 MG tablet Take 1 tablet (5 mg total) by mouth daily. 04/29/23  Yes Vanna Scotland, MD  latanoprost (XALATAN) 0.005 % ophthalmic solution Place 1 drop into both eyes at bedtime. 09/23/15  Yes [provider]  lisinopril (ZESTRIL) 40 MG tablet Take 40 mg by mouth daily. 04/01/22  Yes [provider]  metoprolol tartrate (LOPRESSOR) 25 MG tablet Take 25 mg by mouth 2 (two) times daily. 10/12/20  Yes [provider]  sertraline (ZOLOFT) 50 MG tablet Take 50 mg by mouth daily. 11/07/23  Yes [provider]  acetaminophen (TYLENOL) 325 MG tablet Take 650 mg by mouth every 6 (six) hours as needed for mild pain.  [provider]  alum & mag hydroxide-simeth (ANTACID LIQUID) 200-200-20 MG/5ML suspension Take by mouth 4 (four) times daily as needed for indigestion or heartburn.    [provider]  cyanocobalamin 1000 MCG tablet Take 1 tablet (1,000 mcg total) by mouth daily. 11/30/23 12/30/23  Charise Killian, MD  guaifenesin (ROBITUSSIN) 100 MG/5ML syrup Take 300 mg by mouth 4 (four) times daily as needed for cough.    [provider]  loperamide (IMODIUM A-D) 2 MG tablet Take 2 mg by mouth 4 (four) times daily as needed for diarrhea or loose stools.    [provider]  magnesium hydroxide (MILK OF MAGNESIA) 400 MG/5ML suspension Take 30 mLs by mouth daily as needed for mild constipation.    [provider]  tamsulosin (FLOMAX) 0.4 MG CAPS capsule Take 1 capsule (0.4 mg total) by mouth daily. At bedtime 04/29/23   Vanna Scotland, MD   Physical Exam: Vitals:   11/29/23 0407 11/29/23 0500 11/29/23 0842 11/29/23 1253  BP: (!) 126/46  121/60 (!) 121/46  Pulse: 82  82 66  Resp: 15  17 17   Temp: 98.3 F (36.8 C)  98.1 F (36.7 C) 98 F (36.7 C)  TempSrc: Oral  Oral Oral  SpO2: 100%  96% 92%  Weight:  59.8 kg     Constitutional: appears frail, weak, calm Eyes: PERRL, lids and conjunctivae normal ENMT: Mucous membranes are moist. Posterior pharynx clear of any exudate or lesions. Age-appropriate dentition. Hearing appropriate Neck: normal, supple, no masses, no thyromegaly Respiratory: clear to auscultation bilaterally, no wheezing, no crackles. Normal respiratory effort. No accessory muscle use.  Cardiovascular: Regular rate and rhythm, no murmurs / rubs / gallops. No extremity edema. 2+ pedal pulses. No carotid bruits.  Abdomen: no tenderness, no masses palpated, no hepatosplenomegaly. Bowel sounds positive.  Musculoskeletal: no clubbing / cyanosis. No joint deformity upper and lower extremities. Good ROM, no contractures, no atrophy. Normal muscle tone.  Skin: no rashes, lesions, ulcers. No induration Neurologic: Sensation intact.  Generalized weakness of extremities Psychiatric: Unable to assess judgment and insight. Alert and oriented x 3.  Normal mood.   EKG: independently reviewed, showing sinus rhythm with rate 68, QTc 432  Chest x-ray on Admission: Not indicated at this time  Labs on Admission: I have personally reviewed following labs  CBC: Recent Labs  Lab 12/04/23 2304 12/05/23 0556 12/05/23 0702 12/06/23 1807 12/07/23 0622  WBC 8.9 8.3 8.1 9.6 9.2  NEUTROABS 6.4  --   --   --   --   HGB 8.3* 7.2* 7.2* 8.4* 8.4*  HCT 27.3* 23.3* 23.6* 26.9* 26.6*  MCV 91.6 92.8 90.8 90.9 90.8  PLT 285 299 310 298 290   Basic Metabolic Panel: Recent Labs  Lab 12/05/23 0556 12/05/23 0702 12/06/23 1807 12/07/23 0622 12/08/23 0455  NA 141 138 139 140 135  K 3.8 4.4 4.6 4.6 4.7  CL 123* 116*  118* 119* 111  CO2 12* 16* 13* 16* 17*  GLUCOSE 86 100* 81 99 99  BUN 58* 66* 44* 38* 35*  CREATININE 1.84* 2.13* 1.83* 1.81* 1.73*  CALCIUM 6.9* 8.1* 8.4* 8.5* 8.3*   GFR: Estimated Creatinine Clearance: 22.6 mL/min (A) (by C-G formula based on SCr of 1.73 mg/dL (H)). Liver Function Tests: Recent Labs  Lab 12/05/23 0556  AST 16  ALT 21  ALKPHOS 39  BILITOT 0.3  PROT 4.6*  ALBUMIN 2.1*   Cardiac Enzymes: Recent Labs  Lab 12/04/23 2304  CKTOTAL 252  CBG: Recent Labs  Lab 12/04/23 1242  GLUCAP 85   Urine analysis:    Component Value Date/Time   COLORURINE YELLOW 12/05/2023 0614   APPEARANCEUR CLOUDY (A) 12/05/2023 0614   APPEARANCEUR Clear 01/16/2016 1500   LABSPEC 1.019 12/05/2023 0614   PHURINE 5.0 12/05/2023 0614   GLUCOSEU NEGATIVE 12/05/2023 0614   HGBUR SMALL (A) 12/05/2023 0614   BILIRUBINUR NEGATIVE 12/05/2023 0614   BILIRUBINUR Negative 01/16/2016 1500   KETONESUR NEGATIVE 12/05/2023 0614   PROTEINUR 30 (A) 12/05/2023 0614   NITRITE NEGATIVE 12/05/2023 0614   LEUKOCYTESUR LARGE (A) 12/05/2023 0614   This document was prepared using Dragon Voice Recognition software and may include unintentional dictation errors.  Dr. Sedalia Muta [facility]  If 7PM-7AM, please contact overnight-coverage provider If 7AM-7PM, please contact day attending provider [email or web address]  12/09/2023, 12:53 PM

## 2023-12-09 NOTE — Progress Notes (Signed)
[facility]                                                                              Jon Smith, is a 89 y.o. male, DOB - [birth year], QIO:[id number] Admit date - 12/04/2023    Outpatient Primary MD for the patient is Jon Corona, NP  LOS - 4  days  Chief Complaint  Patient presents with   Fall    Fall on thinners       Brief summary   Patient is a 89 year old male with HLD, PAD, prostate CA status posttreatment, hypertension, anemia, chronic back pain, dementia, CKD presented to ED from ALF with multiple falls.  Patient was seen in ED on 11/30/2023 for AKI, altered mental status.  Patient presented to [facility] ED on 12/5 with a fall, CT head was negative for SDH or IPH, reassuring blood work and was discharged back to facility.  Patient returned back to [facility] ED 12 hours later with 3 falls within an hour.  He has a history of sundowning. In ED, temp 96.7 F, RR 18, HR 88  BP 128/60, O2 sats 96% on room air Labs showed sodium 141, CO2 12, BUN 58, creatinine 1.84 Hemoglobin 7.2  Assessment & Plan    Principal Problem: Acute kidney injury on CKD stage IIIb GFR 30-44 ml/min (HCC), NAG metabolic acidosis -Baseline creatinine 1.6-1.9, creatinine was 2.3 on 11/30/2023 -Presented with creatinine of 1.8 on 12/6, trended up to 2.1 -Renal ultrasound showed no hydronephrosis, 1 cm calculus in the mid left kidney collecting system -Received fluids, continue sodium bicarb 650 mg 3 times daily -Creatinine improving, 1.7, at baseline   Active Problems: Acute on chronic anemia, anemia of chronic disease -Anemia profile shows ferritin 27, iron 16, TIBC 286, percent saturation ratio 6 -Hb 7.2, baseline ~8  -No evidence of GI bleeding -Received 1 unit of packed RBCs on 12/6, IV iron x 1  -H&H stable, 8.4   UTI urinary tract infection, Proteus mirabilis -UA positive for large leukocytes, rare bacteria, WBCs > 50, cloudy -Urine culture growing > 100,000 colonies of  Proteus mirabilis -Continue IV Rocephin   Folic acid deficiency -Folate 5.7, placed on folic acid 1 mg daily -Continue B12   Essential hypertension -BP currently stable, hold lisinopril due to AKI -Resume metoprolol, 12.5 mg twice daily  Recurrent falls, generalized debility, worsening dementia - patient is currently at ALF, has history of dementia and sundowning, has frequent falls  -Mental status likely worsened due to UTI -PT eval-> SNF -Palliative medicine following   BPH -Continue tamsulosin, finasteride  Hyperlipidemia -Continue statin  Prostate CA status post treatment -In remission    Dementia (HCC) -Continue Zoloft  Lung nodule -CT scan C-spine showed 2.5 mm posterior left apical noncalcified lung nodule, stable in size and appearance when compared to recent CT chest in April 2024    Pressure injury of skin -Right buttock stage II, POA -Scrotum lower stage II, POA  Estimated body mass index is 20.08 kg/m as calculated from the following:   Height as of this encounter: 5\' 8"  (1.727 m).   Weight as of this encounter: 59.9 kg.  Code Status: Full CODE STATUS DVT Prophylaxis:  heparin injection 5,000 Units Start: 12/05/23 0600   Level of Care: Level of care: Med-Surg Family Communication: Updated patient's son and wife on phone on 12/6 Disposition Plan:      Remains inpatient appropriate:   Discussed in detail with the patient's family, they feel that he needs a higher level of care and is not thriving in the assisted living facility environment with recurrent falls. Awaiting SNF  Procedures:    Consultants:     Antimicrobials:   Anti-infectives (From admission, onward)    Start     Dose/Rate Route Frequency Ordered Stop   12/07/23 1345  cefTRIAXone (ROCEPHIN) 1 g in sodium chloride 0.9 % 100 mL IVPB        1 g 200 mL/hr over 30 Minutes Intravenous Every 24 hours 12/07/23 1258            Medications  cyanocobalamin  1,000 mcg Oral Daily    finasteride  5 mg Oral Daily   folic acid  1 mg Oral Daily   Gerhardt's butt cream   Topical Daily   heparin  5,000 Units Subcutaneous Q8H   latanoprost  1 drop Both Eyes QHS   metoprolol tartrate  12.5 mg Oral BID   sertraline  50 mg Oral Daily   sodium bicarbonate  650 mg Oral TID   tamsulosin  0.4 mg Oral Daily      Subjective:   Jon Smith was seen and examined today.  Comfortable, no acute complaints.  No fevers or chills.  No nausea vomiting or abdominal pain.     Objective:   Vitals:   12/08/23 1449 12/08/23 1955 12/09/23 0538 12/09/23 0801  BP: (!) 99/55 (!) 153/70 122/66 (!) 142/67  Pulse: 78 86 71 71  Resp: 18 18 17 18   Temp: 98.7 F (37.1 C) 98.1 F (36.7 C) (!) 97.5 F (36.4 C) 98.4 F (36.9 C)  TempSrc: Oral Oral Oral Oral  SpO2: 100% 100% 100% 100%  Weight:      Height:        Intake/Output Summary (Last 24 hours) at 12/09/2023 1319 Last data filed at 12/08/2023 1500 Gross per 24 hour  Intake 456.56 ml  Output 400 ml  Net 56.56 ml      Wt Readings from Last 3 Encounters:  12/07/23 59.9 kg  12/04/23 59.8 kg  11/29/23 59.8 kg   Physical Exam General: Alert and oriented x self, NAD, comfortable Cardiovascular: S1 S2 clear, RRR.  Respiratory: CTAB Gastrointestinal: Soft, nontender, nondistended, NBS Ext: no pedal edema bilaterally Neuro: no new deficits Psych: Normal affect    Data Reviewed:  I have personally reviewed following labs    CBC Lab Results  Component Value Date   WBC 9.2 12/07/2023   RBC 2.93 (L) 12/07/2023   HGB 8.4 (L) 12/07/2023   HCT 26.6 (L) 12/07/2023   MCV 90.8 12/07/2023   MCH 28.7 12/07/2023   PLT 290 12/07/2023   MCHC 31.6 12/07/2023   RDW 17.4 (H) 12/07/2023   LYMPHSABS 1.2 12/04/2023   MONOABS 0.9 12/04/2023   EOSABS 0.3 12/04/2023   BASOSABS 0.1 12/04/2023     Last metabolic panel Lab Results  Component Value Date   NA 135 12/08/2023   K 4.7 12/08/2023   CL 111 12/08/2023   CO2 17 (L)  12/08/2023   BUN 35 (H) 12/08/2023   CREATININE 1.73 (H) 12/08/2023   GLUCOSE 99 12/08/2023   GFRNONAA 36 (L) 12/08/2023  GFRAA 38 (L) 04/02/2020   CALCIUM 8.3 (L) 12/08/2023   PHOS 3.0 11/28/2023   PROT 4.6 (L) 12/05/2023   ALBUMIN 2.1 (L) 12/05/2023   BILITOT 0.3 12/05/2023   ALKPHOS 39 12/05/2023   AST 16 12/05/2023   ALT 21 12/05/2023   ANIONGAP 7 12/08/2023    CBG (last 3)  No results for input(s): "GLUCAP" in the last 72 hours.     Coagulation Profile: No results for input(s): "INR", "PROTIME" in the last 168 hours.   Radiology Studies: I have personally reviewed the imaging studies  No results found.     Thad Ranger M.D. [facility] 12/09/2023, 1:19 PM  Available via Epic secure chat 7am-7pm After 7 pm, please refer to night coverage provider listed on amion.

## 2023-12-09 NOTE — Progress Notes (Signed)
PT Cancellation Note  Patient Details Name: Jon Smith MRN: [id number] DOB: 10-12-1928   Cancelled Treatment:    Reason Eval/Treat Not Completed: (P) Fatigue/lethargy limiting ability to participate, attempted x2 throughout day, pt sleeping on initial attempt and unable to be woken, on second attempt pt lethargic and requesting to continue resting despite encouragement. Will check back as schedule allows to continue with PT POC.  Lenora Boys. PTA Acute Rehabilitation Services Office: [phone]    Catalina Antigua 12/09/2023, 3:49 PM

## 2023-12-09 NOTE — Plan of Care (Signed)
  Problem: Clinical Measurements: Goal: Respiratory complications will improve Outcome: Progressing   Problem: Activity: Goal: Risk for activity intolerance will decrease Outcome: Progressing   Problem: Coping: Goal: Level of anxiety will decrease Outcome: Progressing   

## 2023-12-10 DIAGNOSIS — N179 Acute kidney failure, unspecified: Secondary | ICD-10-CM | POA: Diagnosis not present

## 2023-12-10 MED ORDER — FERROUS SULFATE 325 (65 FE) MG PO TABS
325.0000 mg | ORAL_TABLET | Freq: Every day | ORAL | Status: DC
  Administered 2023-12-11 – 2023-12-12 (×2): 325 mg via ORAL
  Filled 2023-12-10 (×2): qty 1

## 2023-12-10 NOTE — Plan of Care (Signed)
  Problem: Education: Goal: Knowledge of General Education information will improve Description: Including pain rating scale, medication(s)/side effects and non-pharmacologic comfort measures Outcome: Progressing   Problem: Health Behavior/Discharge Planning: Goal: Ability to manage health-related needs will improve Outcome: Progressing   Problem: Clinical Measurements: Goal: Ability to maintain clinical measurements within normal limits will improve Outcome: Progressing Goal: Will remain free from infection Outcome: Progressing Goal: Diagnostic test results will improve Outcome: Progressing Goal: Respiratory complications will improve Outcome: Progressing Goal: Cardiovascular complication will be avoided Outcome: Progressing   Problem: Nutrition: Goal: Adequate nutrition will be maintained Outcome: Progressing   Problem: Elimination: Goal: Will not experience complications related to bowel motility Outcome: Progressing Goal: Will not experience complications related to urinary retention Outcome: Progressing   Problem: Safety: Goal: Ability to remain free from injury will improve Outcome: Progressing   Problem: Skin Integrity: Goal: Risk for impaired skin integrity will decrease Outcome: Progressing

## 2023-12-10 NOTE — Progress Notes (Addendum)
Physical Therapy Treatment Patient Details Name: [facility] MRN: [id number] DOB: 1928/08/26 Today's Date: 12/10/2023   History of Present Illness 89 y.o. male [place] to [facility] [place] [facility] on 12/04/23 after falling multiple times in one day with earlier admit to [facility]. Admitted w/ AKI on CKD and UTI. PMH: CKD, HTN, HLD, PrCA, anemia, PAD, back pain    PT Comments  Pt in bed upon arrival and agreeable to PT session. Worked on bed mobility, transfer training, and LE strength in today's session. Pt required ModAx2-MaxAx2 for sit/supine and supine/sit transfer likely due to increased pain with mobility. Pt had improved sitting balance in today's session and was able to perform LE exercises with CGA for safety. Pt also performed lateral scoots towards HOB with ModAx2 and use of bed pad. Pt required assist for pericare in supine and performed multiple rolls bilaterally to also change linens. Pt is progressing slowly towards goals. Acute PT to follow.      If plan is discharge home, recommend the following: A little help with walking and/or transfers;A little help with bathing/dressing/bathroom;Assist for transportation;Help with stairs or ramp for entrance   Can travel by private vehicle     No  Equipment Recommendations  None recommended by PT       Precautions / Restrictions Precautions Precautions: Fall Restrictions Weight Bearing Restrictions: No     Mobility  Bed Mobility Overal bed mobility: Needs Assistance Bed Mobility: Supine to Sit, Sit to Supine, Rolling Rolling: Min assist, Used rails   Supine to sit: Mod assist, +2 for physical assistance Sit to supine: Max assist, +2 for physical assistance   General bed mobility comments: needs 1UE hold and assist at hips to roll, performed multiple rolls for pericare and linen change. ModAx2 to complete moving LE's off EOB and trunk elevation. Increased assistance needed for sit/supine. Pt would vocalize in pain with movement     Transfers  General transfer comment: worked on lateral scoots in the bed with ModAx2 and bed pad assist. Deferred transfer as pt needed to be cleaned with anticipation of fatigue w/ multiple rolls            Balance Overall balance assessment: Needs assistance Sitting-balance support: Feet supported, Bilateral upper extremity supported Sitting balance-Leahy Scale: Fair Sitting balance - Comments: ModA initially from posterior, progressed to CGA for safety Postural control: Posterior lean     Standing balance comment: Not observed         Cognition Arousal: Alert Behavior During Therapy: WFL for tasks assessed/performed Overall Cognitive Status: History of cognitive impairments - at baseline        General Comments: able to follow single step commands        Exercises General Exercises - Lower Extremity Ankle Circles/Pumps: AROM, Both, 10 reps, Seated Long Arc Quad: AROM, Both, 5 reps, Seated    General Comments General comments (skin integrity, edema, etc.): VSS on RA, pt with increased RR, however, denies lightheadedness or dizziness      Pertinent Vitals/Pain Pain Assessment Pain Assessment: Faces Faces Pain Scale: Hurts little more Pain Location: generalized with mobility Pain Descriptors / Indicators: Aching Pain Intervention(s): Limited activity within patient's tolerance, Monitored during session, Repositioned     PT Goals (current goals can now be found in the care plan section) Acute Rehab PT Goals PT Goal Formulation: With patient Time For Goal Achievement: 12/19/23 Potential to Achieve Goals: Fair Progress towards PT goals: Progressing toward goals    Frequency    Min  1X/week           Co-evaluation   Reason for Co-Treatment: For patient/therapist safety;To address functional/ADL transfers PT goals addressed during session: Balance;Mobility/safety with mobility        AM-PAC PT "6 Clicks" Mobility   Outcome Measure  Help  needed turning from your back to your side while in a flat bed without using bedrails?: A Little Help needed moving from lying on your back to sitting on the side of a flat bed without using bedrails?: A Lot Help needed moving to and from a bed to a chair (including a wheelchair)?: A Lot Help needed standing up from a chair using your arms (e.g., wheelchair or bedside chair)?: Total Help needed to walk in [facility] room?: Total Help needed climbing 3-5 steps with a railing? : Total 6 Click Score: 10    End of Session Equipment Utilized During Treatment: Gait belt Activity Tolerance: Patient tolerated treatment well Patient left: in bed;with call bell/phone within reach;with bed alarm set Nurse Communication: Mobility status (needed new sacral heart pad and condom catheter) PT Visit Diagnosis: Unsteadiness on feet (R26.81);Muscle weakness (generalized) (M62.81)     Time: 9518-8416 PT Time Calculation (min) (ACUTE ONLY): 31 min  Charges:    $Therapeutic Activity: 8-22 mins PT General Charges $$ ACUTE PT VISIT: 1 Visit                    [facility], PT, DPT Secure Chat Preferred  Rehab Office [phone]   [facility] 12/10/2023, 2:12 PM

## 2023-12-10 NOTE — Progress Notes (Signed)
Occupational Therapy Treatment Patient Details Name: [facility] MRN: [id number] DOB: 01-24-1928 Today's Date: 12/10/2023   History of present illness 89 y.o. male presents to [facility] from [place] on 12/04/23 after falling multiple times in one day with earlier admit to [facility]. Admitted w/ AKI on CKD and UTI. PMH: CKD, HTN, HLD, PrCA, anemia, PAD, back pain   OT comments  Patient seen with PT to address bed mobility and lateral scooting. Patient required mod assist +2 to get to EOB and performed lateral scooting to left with mod assist of 2. Patient was found to have soiled bed and was assisted back to supine with max assist +2. Patient performed rolling in bed to address cleaning with total assist with patient requiring gown and linen change. Patient positioned for comfort in bed. Acute OT to contiue to follow to address grooming, self feeding, and transfers. Patient will benefit from continued inpatient follow up therapy, <3 hours/day.      If plan is discharge home, recommend the following:  A lot of help with bathing/dressing/bathroom;Assist for transportation;Help with stairs or ramp for entrance;A lot of help with walking and/or transfers;Assistance with cooking/housework   Equipment Recommendations  None recommended by OT    Recommendations for Other Services      Precautions / Restrictions Precautions Precautions: Fall Restrictions Weight Bearing Restrictions: No       Mobility Bed Mobility Overal bed mobility: Needs Assistance Bed Mobility: Supine to Sit, Sit to Supine, Rolling Rolling: Min assist, Used rails   Supine to sit: Mod assist, +2 for physical assistance Sit to supine: Max assist, +2 for physical assistance   General bed mobility comments: assistance to roll side to side and to increase side lying position for cleaning. Assistance with BLEs and trunk for supine to sit and sit to supine    Transfers Overall transfer level: Needs assistance Equipment  used: None              Lateral/Scoot Transfers: Mod assist, +2 physical assistance General transfer comment: worked on lateral scoots in the bed with ModAx2 and bed pad assist. Deferred transfer as pt needed to be cleaned with anticipation of fatigue w/ multiple rolls     Balance Overall balance assessment: Needs assistance Sitting-balance support: Feet supported, Bilateral upper extremity supported Sitting balance-Leahy Scale: Fair Sitting balance - Comments: ModA initially from posterior, progressed to CGA for safety Postural control: Posterior lean                                 ADL either performed or assessed with clinical judgement   ADL Overall ADL's : Needs assistance/impaired Eating/Feeding: Set up;Sitting   Grooming: Set up;Sitting       Lower Body Bathing: Total assistance Lower Body Bathing Details (indicate cue type and reason): patient had soiled bed and required total assist for cleaning with rolling in bed and assistance of another for rolling Upper Body Dressing : Minimal assistance;Bed level Upper Body Dressing Details (indicate cue type and reason): changed gown                   General ADL Comments: patient required assistance with cleaning due to soiled bed    Extremity/Trunk Assessment              Vision       Perception     Praxis      Cognition Arousal: Alert Behavior During  Therapy: WFL for tasks assessed/performed Overall Cognitive Status: History of cognitive impairments - at baseline                                 General Comments: able to follow single step commands        Exercises      Shoulder Instructions       General Comments VSS on RA    Pertinent Vitals/ Pain       Pain Assessment Pain Assessment: Faces Faces Pain Scale: Hurts little more Pain Location: generalized with mobility Pain Descriptors / Indicators: Aching Pain Intervention(s): Limited activity within  patient's tolerance, Monitored during session, Repositioned  Home Living                                          Prior Functioning/Environment              Frequency  Min 1X/week        Progress Toward Goals  OT Goals(current goals can now be found in the care plan section)  Progress towards OT goals: Progressing toward goals  Acute Rehab OT Goals Patient Stated Goal: none stated OT Goal Formulation: With patient Time For Goal Achievement: 12/19/23 Potential to Achieve Goals: Good ADL Goals Pt Will Perform Eating: Independently;sitting Pt Will Perform Grooming: Independently;sitting Pt Will Perform Lower Body Bathing: with supervision;bed level;sitting/lateral leans Pt Will Perform Lower Body Dressing: with supervision;bed level;sitting/lateral leans Pt Will Transfer to Toilet: with contact guard assist;bedside commode Additional ADL Goal #1: Pt will laterally scoot onto drop arm BSC and complete toileting including clothing management with min assist.  Plan      Co-evaluation    PT/OT/SLP Co-Evaluation/Treatment: Yes Reason for Co-Treatment: For patient/therapist safety;To address functional/ADL transfers PT goals addressed during session: Balance;Mobility/safety with mobility OT goals addressed during session: ADL's and self-care      AM-PAC OT "6 Clicks" Daily Activity     Outcome Measure   Help from another person eating meals?: A Little Help from another person taking care of personal grooming?: A Little Help from another person toileting, which includes using toliet, bedpan, or urinal?: A Lot Help from another person bathing (including washing, rinsing, drying)?: A Lot Help from another person to put on and taking off regular upper body clothing?: A Little Help from another person to put on and taking off regular lower body clothing?: A Lot 6 Click Score: 15    End of Session Equipment Utilized During Treatment: Gait belt  OT Visit  Diagnosis: Muscle weakness (generalized) (M62.81)   Activity Tolerance Patient tolerated treatment well   Patient Left in bed;with call bell/phone within reach;with bed alarm set   Nurse Communication Mobility status        Time: 1300-1330 OT Time Calculation (min): 30 min  Charges: OT General Charges $OT Visit: 1 Visit OT Treatments $Self Care/Home Management : 8-22 mins  Alfonse Flavors, OTA Acute Rehabilitation Services  Office [phone]   Dewain Penning 12/10/2023, 2:31 PM

## 2023-12-10 NOTE — Progress Notes (Signed)
PROGRESS NOTE  Issack Bachand  DOB: Nov 04, 1928  PCP: [name], NP AVW:[id number]  DOA: 12/04/2023  LOS: 5 days  Hospital Day: 7  Brief narrative: [facility] is a 89 y.o. male with PMH significant for HTN, HLD, CKD, PAD, prostate cancer s/p cryotherapy, chronic anemia, chronic back pain, dementia. 12/5, patient presented to the ED at [facility] from ALF with multiple falls. CT head was negative for SDH or IPH.  Blood work was reassuring and patient was discharged back to facility 12 hours later, patient presented to ED at [facility] for recurrent falls. Urinalysis showed cloudy yellow urine with small hemoglobin, large leukocytes, Admitted to [facility] Urine culture grew more than 100,000 CFU per mL of Proteus mirabilis.  Subjective: Patient was seen and examined this afternoon. Pleasant elderly African-American male.  Propped up in bed.  Taking his lunch.  Not in distress.  No new symptoms. Chart reviewed In the last 24 hours, blood pressure in 140s, creatinine better at 1.73 today compared to yesterday  Assessment and plan: AKI on CKD 3B Acute on chronic metabolic acidosis Baseline creatinine less than 2.  Presented with creatinine elevated to 2.5.  Gradually improved with IV fluid. Renal ultrasound ruled out hydronephrosis. Bicarb level was elevated at 12.  Gradually improving.  Continue sodium bicarb supplement.  Recent Labs    11/28/23 0522 11/29/23 0405 11/30/23 0655 12/04/23 1231 12/04/23 2304 12/05/23 0556 12/05/23 0702 12/06/23 1807 12/07/23 0622 12/08/23 0455  BUN 49* 51* 59* 73* 75* 58* 66* 44* 38* 35*  CREATININE 1.94* 1.79* 2.30* 2.51* 2.45* 1.84* 2.13* 1.83* 1.81* 1.73*  CO2 17* 18* 19* 18* 14* 12* 16* 13* 16* 17*   Proteus mirabilis UTI Urinalysis showed cloudy urine with large leukocytes, rare bacteria Urine culture grew more than 100,000 CFU per mL of Proteus mirabilis Currently on IV Rocephin   Acute on chronic anemia Chronic iron  deficiency Folic acid deficiency Baseline hemoglobin close to 8.  Chronically low iron level.  No evidence of GI bleeding Hemoglobin was lowest at 7.2 on 12/6.  1 unit of PRBC transfusion and IV iron were given Last blood work from 12/8 with hemoglobin 8.4.  Repeat labs tomorrow.  Continue oral iron, vitamin B12 and folic acid supplement Recent Labs    11/25/23 1136 11/26/23 0602 12/04/23 2304 12/05/23 0556 12/05/23 0702 12/06/23 1807 12/07/23 0622  HGB 6.8*   < > 8.3* 7.2* 7.2* 8.4* 8.4*  MCV 91.7   < > 91.6 92.8 90.8 90.9 90.8  VITAMINB12 270  --   --   --  1,028*  --   --   FOLATE 8.0  --   --   --  5.7*  --   --   FERRITIN 16*  --   --   --  27  --   --   TIBC 360  --   --   --  286  --   --   IRON 72  --   --   --  16*  --   --   RETICCTPCT 3.0  --   --   --  1.4  --   --    < > = values in this interval not displayed.    Essential hypertension Blood pressure controlled with metoprolol 25 mg twice daily  Lisinopril on hold because of AKI  Recurrent falls generalized debility, worsening dementia patient is currently at ALF, has history of dementia and sundowning, has frequent falls  Mental status likely worsened  due to UTI PT eval-> SNF Palliative medicine following    BPH Continue tamsulosin, finasteride   Hyperlipidemia Continue statin   Prostate CA status post treatment In remission   Dementia Continue Zoloft   Lung nodule CT scan C-spine showed 2.5 mm posterior left apical noncalcified lung nodule, stable in size and appearance when compared to recent CT chest in April 2024   Pressure injury of skin -Right buttock stage II, POA -Scrotum lower stage II, POA   Mobility: PT eval recommended SNF  Goals of care   Code Status: Limited: Do not attempt resuscitation (DNR) -DNR-LIMITED -Do Not Intubate/DNI      DVT prophylaxis:  heparin injection 5,000 Units Start: 12/05/23 0600   Antimicrobials: IV Rocephin Fluid: None Consultants: None Family  Communication: None at bedside  Status: Inpatient Level of care:  Med-Surg   Patient is from: ALF Needs to continue in-hospital care: Pending discharge to SNF Anticipated d/c to: SNF    Diet:  Diet Order             Diet Heart Room service appropriate? Yes; Fluid consistency: Thin  Diet effective now                   Scheduled Meds:  cyanocobalamin  1,000 mcg Oral Daily   [START ON 12/11/2023] ferrous sulfate  325 mg Oral Q breakfast   finasteride  5 mg Oral Daily   folic acid  1 mg Oral Daily   Gerhardt's butt cream   Topical Daily   heparin  5,000 Units Subcutaneous Q8H   latanoprost  1 drop Both Eyes QHS   metoprolol tartrate  12.5 mg Oral BID   sertraline  50 mg Oral Daily   sodium bicarbonate  650 mg Oral TID   tamsulosin  0.4 mg Oral Daily    PRN meds: acetaminophen **OR** acetaminophen, albuterol, haloperidol lactate, ondansetron **OR** ondansetron (ZOFRAN) IV   Infusions:   cefTRIAXone (ROCEPHIN)  IV Stopped (12/09/23 1900)    Antimicrobials: Anti-infectives (From admission, onward)    Start     Dose/Rate Route Frequency Ordered Stop   12/07/23 1345  cefTRIAXone (ROCEPHIN) 1 g in sodium chloride 0.9 % 100 mL IVPB        1 g 200 mL/hr over 30 Minutes Intravenous Every 24 hours 12/07/23 1258         Objective: Vitals:   12/09/23 2058 12/10/23 0751  BP: (!) 176/82 (!) 145/72  Pulse: 83 78  Resp: 20 18  Temp: 98.1 F (36.7 C) 98.2 F (36.8 C)  SpO2: 100% 98%    Intake/Output Summary (Last 24 hours) at 12/10/2023 1355 Last data filed at 12/10/2023 0900 Gross per 24 hour  Intake 0 ml  Output 1200 ml  Net -1200 ml   Filed Weights   12/04/23 2322 12/07/23 0552  Weight: 59.8 kg 59.9 kg   Weight change:  Body mass index is 20.08 kg/m.   Physical Exam: General exam: Pleasant, not in distress Skin: No rashes, lesions or ulcers. HEENT: Atraumatic, normocephalic, no obvious bleeding Lungs: Clear to auscultation bilaterally CVS:  Regular rate and rhythm, no murmur GI/Abd soft, nontender, nondistended, bowel sound present CNS: Alert, awake, able to answer yes/no questions Psychiatry: Mood appropriate Extremities: No pedal edema, no calf tenderness  Data Review: I have personally reviewed the laboratory data and studies available.  F/u labs  Unresulted Labs (From admission, onward)     Start     Ordered   12/11/23 0500  Basic  metabolic panel  Tomorrow morning,   R       Question:  Specimen collection method  Answer:  Lab=Lab collect   12/10/23 1126   12/11/23 0500  CBC with Differential/Platelet  Tomorrow morning,   R       Question:  Specimen collection method  Answer:  Lab=Lab collect   12/10/23 1126   12/07/23 1258  Urinalysis, w/ Reflex to Culture (Infection Suspected) -Urine, Clean Catch  (Urine Labs)  Add-on,   AD       Question:  Specimen Source  Answer:  Urine, Clean Catch   12/07/23 1258   12/04/23 2312  Urinalysis, Routine w reflex microscopic -Urine, Clean Catch  Once,   URGENT       Question:  Specimen Source  Answer:  Urine, Clean Catch   12/04/23 2311            Total time spent in review of labs and imaging, patient evaluation, formulation of plan, documentation and communication with family: 45 minutes  Signed, Lorin Glass, MD [facility] 12/10/2023

## 2023-12-11 DIAGNOSIS — N179 Acute kidney failure, unspecified: Secondary | ICD-10-CM | POA: Diagnosis not present

## 2023-12-11 LAB — URINE CULTURE: Culture: >=100000 — AB

## 2023-12-11 LAB — BASIC METABOLIC PANEL
Anion gap: 10 (ref 5–15)
BUN: 28 mg/dL — ABNORMAL HIGH (ref 8–23)
CO2: 18 mmol/L — ABNORMAL LOW (ref 22–32)
Calcium: 8.7 mg/dL — ABNORMAL LOW (ref 8.9–10.3)
Chloride: 105 mmol/L (ref 98–111)
Creatinine, Ser: 1.64 mg/dL — ABNORMAL HIGH (ref 0.61–1.24)
GFR, Estimated: 39 mL/min — ABNORMAL LOW (ref >60)
Glucose, Bld: 89 mg/dL (ref 70–99)
Potassium: 4.4 mmol/L (ref 3.5–5.1)
Sodium: 133 mmol/L — ABNORMAL LOW (ref 135–145)

## 2023-12-11 LAB — CBC WITH DIFFERENTIAL/PLATELET
Abs Immature Granulocytes: 0.03 10*3/uL (ref 0.00–0.07)
Basophils Absolute: 0.1 10*3/uL (ref 0.0–0.1)
Basophils Relative: 1 %
Eosinophils Absolute: 0.3 10*3/uL (ref 0.0–0.5)
Eosinophils Relative: 3 %
HCT: 28 % — ABNORMAL LOW (ref 39.0–52.0)
Hemoglobin: 8.9 g/dL — ABNORMAL LOW (ref 13.0–17.0)
Immature Granulocytes: 0 %
Lymphocytes Relative: 11 %
Lymphs Abs: 1.1 10*3/uL (ref 0.7–4.0)
MCH: 28.3 pg (ref 26.0–34.0)
MCHC: 31.8 g/dL (ref 30.0–36.0)
MCV: 88.9 fL (ref 80.0–100.0)
Monocytes Absolute: 1 10*3/uL (ref 0.1–1.0)
Monocytes Relative: 9 %
Neutro Abs: 7.7 10*3/uL (ref 1.7–7.7)
Neutrophils Relative %: 76 %
Platelets: 267 10*3/uL (ref 150–400)
RBC: 3.15 MIL/uL — ABNORMAL LOW (ref 4.22–5.81)
RDW: 16.7 % — ABNORMAL HIGH (ref 11.5–15.5)
WBC: 10.1 10*3/uL (ref 4.0–10.5)
nRBC: 0 % (ref 0.0–0.2)

## 2023-12-11 MED ORDER — VITAMIN C 500 MG PO TABS
250.0000 mg | ORAL_TABLET | Freq: Every day | ORAL | Status: DC
  Administered 2023-12-11 – 2023-12-12 (×2): 250 mg via ORAL
  Filled 2023-12-11 (×2): qty 1

## 2023-12-11 MED ORDER — CEPHALEXIN 500 MG PO CAPS
500.0000 mg | ORAL_CAPSULE | ORAL | Status: AC
  Administered 2023-12-11: 500 mg via ORAL
  Filled 2023-12-11: qty 1

## 2023-12-11 MED ORDER — ADULT MULTIVITAMIN W/MINERALS CH
1.0000 | ORAL_TABLET | Freq: Every day | ORAL | Status: DC
  Administered 2023-12-11 – 2023-12-12 (×2): 1 via ORAL
  Filled 2023-12-11 (×2): qty 1

## 2023-12-11 MED ORDER — ENSURE ENLIVE PO LIQD
237.0000 mL | Freq: Two times a day (BID) | ORAL | Status: DC
  Administered 2023-12-11 – 2023-12-12 (×2): 237 mL via ORAL

## 2023-12-11 NOTE — Plan of Care (Signed)

## 2023-12-11 NOTE — Progress Notes (Signed)
PROGRESS NOTE  Caesar Monge  DOB: [birth year]  PCP: [name], NP WUJ:[id number]  DOA: 12/04/2023  LOS: 6 days  Hospital Day: 8  Brief narrative: Jon Smith is a 89 y.o. male with PMH significant for HTN, HLD, CKD, PAD, prostate cancer s/p cryotherapy, chronic anemia, chronic back pain, dementia. 12/5, patient presented to the ED at [facility] from ALF with multiple falls. CT head was negative for SDH or IPH.  Blood work was reassuring and patient was discharged back to facility 12 hours later, patient presented to ED at [facility] for recurrent falls. Urinalysis showed cloudy yellow urine with small hemoglobin, large leukocytes, Admitted to [facility] Urine culture grew more than 100,000 CFU per mL of Proteus mirabilis.  Subjective: Patient was seen and examined this afternoon. Lying on the bed not in distress with no new symptoms.  Comfortable.  Not so conversational.  No family at bedside  Assessment and plan: AKI on CKD 3B Acute on chronic metabolic acidosis Baseline creatinine less than 2.  Presented with creatinine elevated to 2.5.  Gradually improved with IV fluid. Renal ultrasound ruled out hydronephrosis. Creatinine at 1.64 today. Bicarb level was low at 12.  Gradually improving.  Continue sodium bicarb supplement.  Recent Labs    11/29/23 0405 11/30/23 0655 12/04/23 1231 12/04/23 2304 12/05/23 0556 12/05/23 0702 12/06/23 1807 12/07/23 0622 12/08/23 0455 12/11/23 0525  BUN 51* 59* 73* 75* 58* 66* 44* 38* 35* 28*  CREATININE 1.79* 2.30* 2.51* 2.45* 1.84* 2.13* 1.83* 1.81* 1.73* 1.64*  CO2 18* 19* 18* 14* 12* 16* 13* 16* 17* 18*   Proteus mirabilis UTI Urinalysis showed cloudy urine with large leukocytes, rare bacteria Urine culture grew more than 100,000 CFU per mL of Proteus mirabilis Currently on IV Rocephin   Acute on chronic anemia Chronic iron deficiency Folic acid deficiency Baseline hemoglobin close to 8.  Chronically low iron level.  No  evidence of GI bleeding Hemoglobin was lowest at 7.2 on 12/6.  1 unit of PRBC transfusion and IV iron were given Hemoglobin this morning better at 8.9. Continue oral iron, vitamin B12 and folic acid supplement Recent Labs    11/25/23 1136 11/26/23 0602 12/05/23 0556 12/05/23 0702 12/06/23 1807 12/07/23 0622 12/11/23 0525  HGB 6.8*   < > 7.2* 7.2* 8.4* 8.4* 8.9*  MCV 91.7   < > 92.8 90.8 90.9 90.8 88.9  VITAMINB12 270  --   --  1,028*  --   --   --   FOLATE 8.0  --   --  5.7*  --   --   --   FERRITIN 16*  --   --  27  --   --   --   TIBC 360  --   --  286  --   --   --   IRON 72  --   --  16*  --   --   --   RETICCTPCT 3.0  --   --  1.4  --   --   --    < > = values in this interval not displayed.    Essential hypertension Blood pressure controlled with metoprolol 25 mg twice daily  Lisinopril on hold because of AKI  Recurrent falls generalized debility, worsening dementia patient is currently at ALF, has history of dementia and sundowning, has frequent falls  Mental status likely worsened due to UTI PT eval-> SNF Palliative medicine following    BPH Continue tamsulosin, finasteride   Hyperlipidemia Continue statin  Prostate CA status post treatment In remission   Dementia Continue Zoloft   Lung nodule CT scan C-spine showed 2.5 mm posterior left apical noncalcified lung nodule, stable in size and appearance when compared to recent CT chest in April 2024   Pressure injury of skin -Right buttock stage II, POA -Scrotum lower stage II, POA   Mobility: PT eval recommended SNF  Goals of care   Code Status: Limited: Do not attempt resuscitation (DNR) -DNR-LIMITED -Do Not Intubate/DNI      DVT prophylaxis:  heparin injection 5,000 Units Start: 12/05/23 0600   Antimicrobials: IV Rocephin Fluid: None Consultants: None Family Communication: None at bedside  Status: Inpatient Level of care:  Med-Surg   Patient is from: ALF Needs to continue in-hospital  care: Pending insurance authorization to SNF Anticipated d/c to: SNF    Diet:  Diet Order             Diet regular Room service appropriate? Yes with Assist; Fluid consistency: Thin  Diet effective now                   Scheduled Meds:  ascorbic acid  250 mg Oral Daily   cyanocobalamin  1,000 mcg Oral Daily   feeding supplement  237 mL Oral BID BM   ferrous sulfate  325 mg Oral Q breakfast   finasteride  5 mg Oral Daily   folic acid  1 mg Oral Daily   Gerhardt's butt cream   Topical Daily   heparin  5,000 Units Subcutaneous Q8H   latanoprost  1 drop Both Eyes QHS   metoprolol tartrate  12.5 mg Oral BID   multivitamin with minerals  1 tablet Oral Daily   sertraline  50 mg Oral Daily   sodium bicarbonate  650 mg Oral TID   tamsulosin  0.4 mg Oral Daily    PRN meds: acetaminophen **OR** acetaminophen, albuterol, haloperidol lactate, ondansetron **OR** ondansetron (ZOFRAN) IV   Infusions:     Antimicrobials: Anti-infectives (From admission, onward)    Start     Dose/Rate Route Frequency Ordered Stop   12/11/23 1430  cephALEXin (KEFLEX) capsule 500 mg        500 mg Oral Every 24 hours 12/11/23 1332 12/11/23 1349   12/07/23 1345  cefTRIAXone (ROCEPHIN) 1 g in sodium chloride 0.9 % 100 mL IVPB  Status:  Discontinued        1 g 200 mL/hr over 30 Minutes Intravenous Every 24 hours 12/07/23 1258 12/11/23 1330       Objective: Vitals:   12/11/23 0933 12/11/23 0936  BP: (!) 113/57 (!) 113/57  Pulse: 83 83  Resp:    Temp:    SpO2:      Intake/Output Summary (Last 24 hours) at 12/11/2023 1356 Last data filed at 12/10/2023 1833 Gross per 24 hour  Intake 240 ml  Output --  Net 240 ml   Filed Weights   12/04/23 2322 12/07/23 0552 12/11/23 0500  Weight: 59.8 kg 59.9 kg 61.2 kg   Weight change:  Body mass index is 20.51 kg/m.   Physical Exam: General exam: Pleasant, not in distress Skin: No rashes, lesions or ulcers. HEENT: Atraumatic, normocephalic, no  obvious bleeding Lungs: Clear to auscultation bilaterally CVS: Regular rate and rhythm, no murmur GI/Abd soft, nontender, nondistended, bowel sound present CNS: Alert, awake, able to answer yes/no questions Psychiatry: Mood appropriate Extremities: No pedal edema, no calf tenderness  Data Review: I have personally reviewed the laboratory data and studies  available.  F/u labs  Unresulted Labs (From admission, onward)     Start     Ordered   12/07/23 1258  Urinalysis, w/ Reflex to Culture (Infection Suspected) -Urine, Clean Catch  (Urine Labs)  Add-on,   AD       Question:  Specimen Source  Answer:  Urine, Clean Catch   12/07/23 1258            Total time spent in review of labs and imaging, patient evaluation, formulation of plan, documentation and communication with family: 25 minutes  Signed, Lorin Glass, MD [facility] 12/11/2023

## 2023-12-11 NOTE — Plan of Care (Signed)
  Problem: Health Behavior/Discharge Planning: Goal: Ability to manage health-related needs will improve Outcome: Progressing   Problem: Nutrition: Goal: Adequate nutrition will be maintained Outcome: Progressing   Problem: Coping: Goal: Level of anxiety will decrease Outcome: Progressing   Problem: Elimination: Goal: Will not experience complications related to bowel motility Outcome: Progressing   Problem: Pain Management: Goal: General experience of comfort will improve Outcome: Progressing   Problem: Safety: Goal: Ability to remain free from injury will improve Outcome: Progressing

## 2023-12-11 NOTE — TOC Progression Note (Signed)
Transition of Care [facility]) - Progression Note    Patient Details  Name: Jon Smith MRN: [id number] Date of Birth: [birth year]  [facility]) CM/SW Contact  Lorri Frederick, LCSW Phone Number: 12/11/2023, 12:33 PM  Clinical Narrative:   Drucie Opitz request remains pending at this time.    Expected Discharge Plan: Skilled Nursing Facility Barriers to Discharge: Continued Medical Work up, SNF Pending bed offer  Expected Discharge Plan and Services In-house Referral: Clinical Social Work   Post Acute Care Choice: Skilled Nursing Facility Living arrangements for the past 2 months: Assisted Living Facility ([facility] [place] [facility]                                       Social Determinants of Health (SDOH) Interventions SDOH Screenings   Food Insecurity: Patient Unable To Answer (11/25/2023)  Housing: Patient Unable To Answer (04/11/2023)  Transportation Needs: No Transportation Needs (11/25/2023)  Utilities: Not At Risk (11/25/2023)  Financial Resource Strain: High Risk (07/25/2020)   Received from [facility], [facility]  Tobacco Use: Medium Risk (12/05/2023)    Readmission Risk Interventions     No data to display

## 2023-12-11 NOTE — Progress Notes (Signed)
Initial Nutrition Assessment  DOCUMENTATION CODES:   Underweight  INTERVENTION:   -Liberalize diet to regular due to age, quality of life and underweight status.  -Supplement meals with Ensure Enlive po BID, each supplement provides 350 kcal and 20 grams of protein.  -MVI/Minerals-1 Tab daily, vitamin C 250 mg daily to support pressure injury healing.   NUTRITION DIAGNOSIS:   Increased nutrient needs related to chronic illness, other (see comment) (pressure injuries) as evidenced by estimated needs.    GOAL:   Patient will meet greater than or equal to 90% of their needs    MONITOR:   PO intake, Weight trends, Supplement acceptance, Skin, Labs  REASON FOR ASSESSMENT:   Malnutrition Screening Tool  -Weight loss unknown  ASSESSMENT: 89 y/o male presented to the ED for the second time in 24 hrs (three falls in the space of one hour at the SNF) after a fall. Admitted with AKI. PMH: HLD, PAD, prostate CA S/P treatment, HTN, anemia, chronic back pain, dementia, elevated LFTs, hepatitis, lower extremity ulceration, nocturia, renal stones, smoking hx.   Patient has difficulty hearing. He is sleeping and awakens, answers questions. Denies any change in his diet, unclear accuracy. Per EMR shows a 10% weight loss in eight months from 04/11/23.  He allows NFPE however difficult to get him repositioned as he stays curled up in fetal position. Poor dentition noted, denies difficulty chewing. Intakes recorded average 87% x 7 meals. Agrees to taking Ensure.   Medications reviewed and include  vitamin B12 1,000 mcg daily, ferrous sulfate 325 mg daily with B, folic acid 1 mg daily, sodium bicarbonate 650 mg 3x daily.  Labs reviewed  NUTRITION - FOCUSED PHYSICAL EXAM: Noted muscle depletion may be related to age.  Flowsheet Row Most Recent Value  Orbital Region No depletion  Upper Arm Region Mild depletion  Thoracic and Lumbar Region Unable to assess  Buccal Region Mild depletion   Temple Region No depletion  Clavicle Bone Region Mild depletion  Clavicle and Acromion Bone Region Mild depletion  Scapular Bone Region Mild depletion  Dorsal Hand Mild depletion  Patellar Region Moderate depletion  Anterior Thigh Region Moderate depletion  Posterior Calf Region Moderate depletion  Edema (RD Assessment) None  Hair Reviewed  Eyes Reviewed  Mouth Reviewed  Skin Reviewed  Nails Reviewed       Diet Order:   Diet Order             Diet Heart Room service appropriate? Yes; Fluid consistency: Thin  Diet effective now                   EDUCATION NEEDS:   Not appropriate for education at this time  Skin:  Skin Assessment: Skin Integrity Issues: Skin Integrity Issues:: Stage II Stage II: buttocks, scrotum  Last BM:  12/10/23, type 6-large  Height:   Ht Readings from Last 1 Encounters:  12/04/23 5\' 8"  (1.727 m)    Weight:   Wt Readings from Last 1 Encounters:  12/11/23 61.2 kg    Ideal Body Weight:  70 kg  BMI:  Body mass index is 20.51 kg/m.  Estimated Nutritional Needs:   Kcal:  1700-1850 kcal/day  Protein:  73-86 gm/day  Fluid:  1700-1850 mL/day    Alvino Chapel, RDLD Clinical Dietitian If unable to reach, please contact "RD Inpatient" secure chat group between 8 am-4 pm daily"

## 2023-12-12 DIAGNOSIS — R296 Repeated falls: Secondary | ICD-10-CM | POA: Diagnosis not present

## 2023-12-12 DIAGNOSIS — L89616 Pressure-induced deep tissue damage of right heel: Secondary | ICD-10-CM | POA: Diagnosis not present

## 2023-12-12 DIAGNOSIS — M6281 Muscle weakness (generalized): Secondary | ICD-10-CM | POA: Diagnosis not present

## 2023-12-12 DIAGNOSIS — R911 Solitary pulmonary nodule: Secondary | ICD-10-CM | POA: Diagnosis not present

## 2023-12-12 DIAGNOSIS — F17200 Nicotine dependence, unspecified, uncomplicated: Secondary | ICD-10-CM | POA: Diagnosis not present

## 2023-12-12 DIAGNOSIS — N3000 Acute cystitis without hematuria: Secondary | ICD-10-CM | POA: Diagnosis not present

## 2023-12-12 DIAGNOSIS — M6259 Muscle wasting and atrophy, not elsewhere classified, multiple sites: Secondary | ICD-10-CM | POA: Diagnosis not present

## 2023-12-12 DIAGNOSIS — I739 Peripheral vascular disease, unspecified: Secondary | ICD-10-CM | POA: Diagnosis not present

## 2023-12-12 DIAGNOSIS — D518 Other vitamin B12 deficiency anemias: Secondary | ICD-10-CM | POA: Diagnosis not present

## 2023-12-12 DIAGNOSIS — R488 Other symbolic dysfunctions: Secondary | ICD-10-CM | POA: Diagnosis not present

## 2023-12-12 DIAGNOSIS — F331 Major depressive disorder, recurrent, moderate: Secondary | ICD-10-CM | POA: Diagnosis not present

## 2023-12-12 DIAGNOSIS — E119 Type 2 diabetes mellitus without complications: Secondary | ICD-10-CM | POA: Diagnosis not present

## 2023-12-12 DIAGNOSIS — N390 Urinary tract infection, site not specified: Secondary | ICD-10-CM | POA: Diagnosis not present

## 2023-12-12 DIAGNOSIS — I70223 Atherosclerosis of native arteries of extremities with rest pain, bilateral legs: Secondary | ICD-10-CM | POA: Diagnosis not present

## 2023-12-12 DIAGNOSIS — N179 Acute kidney failure, unspecified: Secondary | ICD-10-CM | POA: Diagnosis not present

## 2023-12-12 DIAGNOSIS — N189 Chronic kidney disease, unspecified: Secondary | ICD-10-CM | POA: Diagnosis not present

## 2023-12-12 DIAGNOSIS — R1312 Dysphagia, oropharyngeal phase: Secondary | ICD-10-CM | POA: Diagnosis not present

## 2023-12-12 DIAGNOSIS — Z7401 Bed confinement status: Secondary | ICD-10-CM | POA: Diagnosis not present

## 2023-12-12 DIAGNOSIS — E785 Hyperlipidemia, unspecified: Secondary | ICD-10-CM | POA: Diagnosis not present

## 2023-12-12 DIAGNOSIS — N400 Enlarged prostate without lower urinary tract symptoms: Secondary | ICD-10-CM | POA: Diagnosis not present

## 2023-12-12 DIAGNOSIS — R454 Irritability and anger: Secondary | ICD-10-CM | POA: Diagnosis not present

## 2023-12-12 DIAGNOSIS — E782 Mixed hyperlipidemia: Secondary | ICD-10-CM | POA: Diagnosis not present

## 2023-12-12 DIAGNOSIS — D649 Anemia, unspecified: Secondary | ICD-10-CM | POA: Diagnosis not present

## 2023-12-12 DIAGNOSIS — N1832 Chronic kidney disease, stage 3b: Secondary | ICD-10-CM | POA: Diagnosis not present

## 2023-12-12 DIAGNOSIS — I639 Cerebral infarction, unspecified: Secondary | ICD-10-CM | POA: Diagnosis not present

## 2023-12-12 DIAGNOSIS — D508 Other iron deficiency anemias: Secondary | ICD-10-CM | POA: Diagnosis not present

## 2023-12-12 DIAGNOSIS — E038 Other specified hypothyroidism: Secondary | ICD-10-CM | POA: Diagnosis not present

## 2023-12-12 DIAGNOSIS — E559 Vitamin D deficiency, unspecified: Secondary | ICD-10-CM | POA: Diagnosis not present

## 2023-12-12 DIAGNOSIS — R531 Weakness: Secondary | ICD-10-CM | POA: Diagnosis not present

## 2023-12-12 DIAGNOSIS — G4709 Other insomnia: Secondary | ICD-10-CM | POA: Diagnosis not present

## 2023-12-12 DIAGNOSIS — I10 Essential (primary) hypertension: Secondary | ICD-10-CM | POA: Diagnosis not present

## 2023-12-12 DIAGNOSIS — R058 Other specified cough: Secondary | ICD-10-CM | POA: Diagnosis not present

## 2023-12-12 DIAGNOSIS — L89152 Pressure ulcer of sacral region, stage 2: Secondary | ICD-10-CM | POA: Diagnosis not present

## 2023-12-12 DIAGNOSIS — I2510 Atherosclerotic heart disease of native coronary artery without angina pectoris: Secondary | ICD-10-CM | POA: Diagnosis not present

## 2023-12-12 DIAGNOSIS — E7849 Other hyperlipidemia: Secondary | ICD-10-CM | POA: Diagnosis not present

## 2023-12-12 DIAGNOSIS — L89622 Pressure ulcer of left heel, stage 2: Secondary | ICD-10-CM | POA: Diagnosis not present

## 2023-12-12 MED ORDER — SODIUM BICARBONATE 650 MG PO TABS: 650.0000 mg | ORAL_TABLET | Freq: Three times a day (TID) | ORAL | Status: DC

## 2023-12-12 MED ORDER — METOPROLOL TARTRATE 25 MG PO TABS: 12.5000 mg | ORAL_TABLET | Freq: Two times a day (BID) | ORAL | Status: DC

## 2023-12-12 NOTE — TOC Progression Note (Addendum)
Transition of Care [facility]) - Progression Note    Patient Details  Name: Jon Smith MRN: [id number] Date of Birth: 04-09-1928  [facility] CM/SW Contact  Lorri Frederick, LCSW Phone Number: 12/12/2023, 11:00 AM  Clinical Narrative:   SNF auth approved: 12/13 - 12/20 Certified in [employer] Cert#[id number].  1130: [facility] confirmed they can receive pt today.  MD notfiied.     Expected Discharge Plan: Skilled Nursing Facility Barriers to Discharge: Continued Medical Work up, SNF Pending bed offer  Expected Discharge Plan and Services In-house Referral: Clinical Social Work   Post Acute Care Choice: Skilled Nursing Facility Living arrangements for the past 2 months: Assisted Living Facility ([facility] [place] [facility]                                       Social Determinants of Health (SDOH) Interventions SDOH Screenings   Food Insecurity: Patient Unable To Answer (11/25/2023)  Housing: Patient Unable To Answer (04/11/2023)  Transportation Needs: No Transportation Needs (11/25/2023)  Utilities: Not At Risk (11/25/2023)  Financial Resource Strain: High Risk (07/25/2020)   Received from [facility], [facility]  Tobacco Use: Medium Risk (12/05/2023)    Readmission Risk Interventions     No data to display

## 2023-12-12 NOTE — Discharge Summary (Signed)
Physician Discharge Summary  Fran Schulenberg WGN:[id number] DOB: September 01, 1928 DOA: 12/04/2023  PCP: [name], NP  Admit date: 12/04/2023 Discharge date: 12/12/2023  Admitted From: Home Discharge disposition: SNF   Brief narrative: Jon Smith is a 89 y.o. male with PMH significant for HTN, HLD, CKD, PAD, prostate cancer s/p cryotherapy, chronic anemia, chronic back pain, dementia. 12/5, patient presented to the ED at [facility] from ALF with multiple falls. CT head was negative for SDH or IPH.  Blood work was reassuring and patient was discharged back to facility 12 hours later, patient presented to ED at [facility] for recurrent falls. Urinalysis showed cloudy yellow urine with small hemoglobin, large leukocytes, Admitted to [facility] Urine culture grew more than 100,000 CFU per mL of Proteus mirabilis.  Subjective: Patient was seen and examined this morning.  Lying on bed.  Not in distress.  No family at bedside.  Assessment and plan: AKI on CKD 3B Acute on chronic metabolic acidosis Baseline creatinine less than 2.  Presented with creatinine elevated to 2.5.  Gradually improved with IV fluid. Renal ultrasound ruled out hydronephrosis. Creatinine gradually improved.  1.6 from last check yesterday. Bicarb level was low at 12.  Gradually improving with sodium bicarb supplement.  Recent Labs    11/29/23 0405 11/30/23 0655 12/04/23 1231 12/04/23 2304 12/05/23 0556 12/05/23 0702 12/06/23 1807 12/07/23 0622 12/08/23 0455 12/11/23 0525  BUN 51* 59* 73* 75* 58* 66* 44* 38* 35* 28*  CREATININE 1.79* 2.30* 2.51* 2.45* 1.84* 2.13* 1.83* 1.81* 1.73* 1.64*  CO2 18* 19* 18* 14* 12* 16* 13* 16* 17* 18*   Proteus mirabilis UTI Urinalysis showed cloudy urine with large leukocytes, rare bacteria Urine culture grew more than 100,000 CFU per mL of Proteus mirabilis Completed a course of antibiotics with IV Rocephin and oral Keflex   Acute on chronic anemia Chronic iron  deficiency Folic acid deficiency Baseline hemoglobin close to 8.  Chronically low iron level.  No evidence of GI bleeding Hemoglobin was lowest at 7.2 on 12/6.  1 unit of PRBC transfusion and IV iron were given Hemoglobin improved to 8.9 on last check on 12/12. Continue oral iron, vitamin B12 and folic acid supplement Recent Labs    11/25/23 1136 11/26/23 0602 12/05/23 0556 12/05/23 0702 12/06/23 1807 12/07/23 0622 12/11/23 0525  HGB 6.8*   < > 7.2* 7.2* 8.4* 8.4* 8.9*  MCV 91.7   < > 92.8 90.8 90.9 90.8 88.9  VITAMINB12 270  --   --  1,028*  --   --   --   FOLATE 8.0  --   --  5.7*  --   --   --   FERRITIN 16*  --   --  27  --   --   --   TIBC 360  --   --  286  --   --   --   IRON 72  --   --  16*  --   --   --   RETICCTPCT 3.0  --   --  1.4  --   --   --    < > = values in this interval not displayed.   Essential hypertension Blood pressure controlled with metoprolol 25 mg twice daily  Lisinopril on hold because of AKI.  Blood pressure rising up.  Can resume lisinopril at discharge.  Recurrent falls generalized debility, worsening dementia patient presented from ALF, has history of dementia and sundowning, has frequent falls  Mental status likely worsened  due to UTI.  Gradually improved. PT eval-> SNF Palliative medicine following    BPH Continue tamsulosin, finasteride   Hyperlipidemia Continue aspirin, Plavix and statin.   Prostate CA status post treatment In remission   Dementia Continue Zoloft   Lung nodule CT scan C-spine showed 2.5 mm posterior left apical noncalcified lung nodule, stable in size and appearance when compared to recent CT chest in April 2024   Pressure injury of skin -Right buttock stage II, POA -Scrotum lower stage II, POA   Goals of care   Code Status: Limited: Do not attempt resuscitation (DNR) -DNR-LIMITED -Do Not Intubate/DNI    Diet:  Diet Order             Diet general           Diet regular Room service appropriate?  Yes with Assist; Fluid consistency: Thin  Diet effective now                   Nutritional status:  Body mass index is 20.51 kg/m.  Nutrition Problem: Increased nutrient needs Etiology: chronic illness, other (see comment) (pressure injuries) Signs/Symptoms: estimated needs  Wounds:  - Pressure Injury Buttocks Right Stage 2 -  Partial thickness loss of dermis presenting as a shallow open injury with a red, pink wound bed without slough. Stage 2 to right buttock (Active)  No Date First Assessed or Time First Assessed found.   Location: Buttocks  Location Orientation: Right  Staging: Stage 2 -  Partial thickness loss of dermis presenting as a shallow open injury with a red, pink wound bed without slough.  Wound Descript...    Assessments 12/05/2023  2:28 AM 12/11/2023  7:45 PM  Dressing Type Foam - Lift dressing to assess site every shift Foam - Lift dressing to assess site every shift  Dressing Clean, Dry, Intact Intact  Site / Wound Assessment Clean;Dry;Painful Pink;Red  Peri-wound Assessment Intact Intact  Drainage Amount None None  Treatment -- Cleansed     No associated orders.     Pressure Injury Scrotum Lower Stage 2 -  Partial thickness loss of dermis presenting as a shallow open injury with a red, pink wound bed without slough. Stage 2 to scrotum (Active)  No Date First Assessed or Time First Assessed found.   Location: Scrotum  Location Orientation: Lower  Staging: Stage 2 -  Partial thickness loss of dermis presenting as a shallow open injury with a red, pink wound bed without slough.  Wound Descripti...    Assessments 12/05/2023  2:28 AM 12/11/2023  7:45 PM  Dressing Type None Foam - Lift dressing to assess site every shift  Dressing -- Intact  Site / Wound Assessment Clean;Dry;Painful Pink;Red  Peri-wound Assessment Intact Intact  Drainage Amount None --     No associated orders.    Discharge Exam:   Vitals:   12/11/23 1439 12/11/23 1945 12/12/23 0630 12/12/23  0719  BP: (!) 116/58 (!) 141/56 120/63 (!) 145/59  Pulse: 77 85 78 85  Resp:  20 16   Temp: 98.1 F (36.7 C) 98.2 F (36.8 C) 98.3 F (36.8 C) 98.6 F (37 C)  TempSrc: Oral Oral Axillary Oral  SpO2: 100% 100% 100% 100%  Weight:      Height:        Body mass index is 20.51 kg/m.  General exam: Pleasant, not in distress Skin: No rashes, lesions or ulcers. HEENT: Atraumatic, normocephalic, no obvious bleeding Lungs: Clear to auscultation bilaterally CVS: Regular rate  and rhythm, no murmur GI/Abd soft, nontender, nondistended, bowel sound present CNS: Alert, awake, able to answer yes/no questions Psychiatry: Mood appropriate Extremities: No pedal edema, no calf tenderness  Follow ups:    Contact information for follow-up providers     [name], NP Follow up.   Specialty: Internal Medicine Contact information: [address] [place] [address] [phone]              Contact information for after-discharge care     Destination     [facility] Preferred SNF .   Service: Skilled Nursing Contact information: [address] [place] [address] [phone]                     Discharge Instructions:   Discharge Instructions     Call MD for:  difficulty breathing, headache or visual disturbances   Complete by: As directed    Call MD for:  extreme fatigue   Complete by: As directed    Call MD for:  hives   Complete by: As directed    Call MD for:  persistant dizziness or light-headedness   Complete by: As directed    Call MD for:  persistant nausea and vomiting   Complete by: As directed    Call MD for:  severe uncontrolled pain   Complete by: As directed    Call MD for:  temperature >100.4   Complete by: As directed    Diet general   Complete by: As directed    Discharge wound care:   Complete by: As directed    Increase activity slowly   Complete by: As directed         Discharge Medications:   Allergies as of 12/12/2023       Reactions   Aspirin Nausea And Vomiting   Sulfamethoxazole-trimethoprim    Other reaction(s): Blood Disorder Coagulopathy        Medication List     STOP taking these medications    amoxicillin-clavulanate 875-125 MG tablet Commonly known as: AUGMENTIN       TAKE these medications    acetaminophen 325 MG tablet Commonly known as: TYLENOL Take 650 mg by mouth every 6 (six) hours as needed for mild pain.   Antacid Liquid 200-200-20 MG/5ML suspension Generic drug: alum & mag hydroxide-simeth Take by mouth 4 (four) times daily as needed for indigestion or heartburn.   Aspirin Low Dose 81 MG tablet Generic drug: aspirin EC Take 81 mg by mouth daily.   atorvastatin 40 MG tablet Commonly known as: LIPITOR Take 40 mg by mouth daily.   clopidogrel 75 MG tablet Commonly known as: PLAVIX Take by mouth.   cyanocobalamin 1000 MCG tablet Take 1 tablet (1,000 mcg total) by mouth daily.   ferrous sulfate 325 (65 FE) MG EC tablet Take 1 tablet by mouth in the morning and at bedtime.   finasteride 5 MG tablet Commonly known as: PROSCAR Take 1 tablet (5 mg total) by mouth daily.   guaifenesin 100 MG/5ML syrup Commonly known as: ROBITUSSIN Take 300 mg by mouth 4 (four) times daily as needed for cough.   latanoprost 0.005 % ophthalmic solution Commonly known as: XALATAN Place 1 drop into both eyes at bedtime.   lisinopril 40 MG tablet Commonly known as: ZESTRIL Take 40 mg by mouth daily.   loperamide 2 MG tablet Commonly known as: IMODIUM A-D Take 2 mg by mouth 4 (four) times daily as needed for  diarrhea or loose stools.   magnesium hydroxide 400 MG/5ML suspension Commonly known as: MILK OF MAGNESIA Take 30 mLs by mouth daily as needed for mild constipation.   metoprolol tartrate 25 MG tablet Commonly known as: LOPRESSOR Take 0.5 tablets (12.5 mg total) by mouth 2 (two) times daily. What changed:  how much to take   sertraline 50 MG tablet Commonly known as: ZOLOFT Take 50 mg by mouth daily.   sodium bicarbonate 650 MG tablet Take 1 tablet (650 mg total) by mouth 3 (three) times daily.   tamsulosin 0.4 MG Caps capsule Commonly known as: FLOMAX Take 1 capsule (0.4 mg total) by mouth daily. At bedtime               Discharge Care Instructions  (From admission, onward)           Start     Ordered   12/12/23 0000  Discharge wound care:        12/12/23 1305             The results of significant diagnostics from this hospitalization (including imaging, microbiology, ancillary and laboratory) are listed below for reference.    Procedures and Diagnostic Studies:   CT CERVICAL SPINE WO CONTRAST Result Date: 12/04/2023 CLINICAL DATA:  Status post fall. EXAM: CT CERVICAL SPINE WITHOUT CONTRAST TECHNIQUE: Multidetector CT imaging of the cervical spine was performed without intravenous contrast. Multiplanar CT image reconstructions were also generated. RADIATION DOSE REDUCTION: This exam was performed according to the departmental dose-optimization program which includes automated exposure control, adjustment of the mA and/or kV according to patient size and/or use of iterative reconstruction technique. COMPARISON:  None Available. FINDINGS: Alignment: Normal. Skull base and vertebrae: No acute fracture. No primary bone lesion or focal pathologic process. Soft tissues and spinal canal: No prevertebral fluid or swelling. No visible canal hematoma. Disc levels: Mild to moderate severity multilevel endplate sclerosis, moderate to marked severity anterior osteophyte formation and moderate to marked severity posterior bony spurring are seen throughout all levels of the cervical spine. There is marked severity narrowing of the anterior and lateral axial articulation. Moderate severity intervertebral disc space narrowing is seen at the levels of C4-C5, C5-C6 and C6-C7. Bilateral  marked severity multilevel facet joint hypertrophy is noted. Upper chest: A 2.5 mm posterior left apical noncalcified lung nodule is seen (axial CT image 76, CT series 4). This is stable in size and appearance when compared to the patient's most recent chest CT (April 11, 2023). Other: None. IMPRESSION: 1. No acute fracture or subluxation of the cervical spine. 2. Moderate to marked severity multilevel degenerative changes throughout the cervical spine. 3. 2.5 mm posterior left apical noncalcified lung nodule. Electronically Signed   By: Aram Candela M.D.   On: 12/04/2023 23:52   CT HEAD WO CONTRAST Result Date: 12/04/2023 CLINICAL DATA:  Status post fall. EXAM: CT HEAD WITHOUT CONTRAST TECHNIQUE: Contiguous axial images were obtained from the base of the skull through the vertex without intravenous contrast. RADIATION DOSE REDUCTION: This exam was performed according to the departmental dose-optimization program which includes automated exposure control, adjustment of the mA and/or kV according to patient size and/or use of iterative reconstruction technique. COMPARISON:  December 04, 2023 (10:36 a.m.) FINDINGS: Brain: There is moderate severity cerebral atrophy with widening of the extra-axial spaces and ventricular dilatation. There are areas of decreased attenuation within the white matter tracts of the supratentorial brain, consistent with microvascular disease changes. Chronic bilateral basal ganglia lacunar infarcts are  noted. Vascular: No hyperdense vessel or unexpected calcification. Skull: A chronic right-sided nasal bone fracture is noted. Sinuses/Orbits: No acute finding. Other: None. IMPRESSION: 1. Generalized cerebral atrophy with chronic white matter small vessel ischemic changes. 2. No acute intracranial abnormality. Electronically Signed   By: Aram Candela M.D.   On: 12/04/2023 23:36   DG Chest Port 1 View Result Date: 12/04/2023 CLINICAL DATA:  Status post fall. EXAM: PORTABLE CHEST  1 VIEW COMPARISON:  None Available. FINDINGS: The heart size and mediastinal contours are within normal limits. There is mild, stable elevation of the right hemidiaphragm. Both lungs are clear. The visualized skeletal structures are unremarkable. IMPRESSION: No active cardiopulmonary disease. Electronically Signed   By: Aram Candela M.D.   On: 12/04/2023 23:31   CT HEAD WO CONTRAST ( ) Result Date: 12/04/2023 CLINICAL DATA:  Head trauma, minor (Age >= 65y); Neck trauma (Age >= 65y). EXAM: CT HEAD WITHOUT CONTRAST CT CERVICAL SPINE WITHOUT CONTRAST TECHNIQUE: Multidetector CT imaging of the head and cervical spine was performed following the standard protocol without intravenous contrast. Multiplanar CT image reconstructions of the cervical spine were also generated. RADIATION DOSE REDUCTION: This exam was performed according to the departmental dose-optimization program which includes automated exposure control, adjustment of the mA and/or kV according to patient size and/or use of iterative reconstruction technique. COMPARISON:  CT scan head from 04/11/2023. FINDINGS: CT HEAD FINDINGS Brain: No evidence of acute infarction, hemorrhage, hydrocephalus, extra-axial collection or mass lesion/mass effect. There is bilateral periventricular hypodensity, which is non-specific but most likely seen in the settings of microvascular ischemic changes. Moderate in extent. Otherwise normal appearance of brain parenchyma. Ventricles are normal. Cerebral volume is age appropriate. Vascular: No hyperdense vessel or unexpected calcification. Intracranial arteriosclerosis. Skull: Normal. Negative for fracture or focal lesion. Sinuses/Orbits: No acute finding. Other: Visualized mastoid air cells are unremarkable. No mastoid effusion. CT CERVICAL SPINE FINDINGS Alignment: Normal. This examination does not assess for ligamentous injury or stability. Skull base and vertebrae: No acute fracture. No primary bone lesion or focal  pathologic process. Soft tissues and spinal canal: No prevertebral fluid or swelling. No visible canal hematoma. Disc levels: Mild-to-moderately reduced disc height of C4-5 through C7-T1 levels. Is mild multilevel facet arthropathy and moderate multilevel marginal osteophyte formation. Upper chest: Negative. Other: None. IMPRESSION: *No acute intracranial abnormality. *No acute osseous injury or traumatic listhesis of the cervical spine. Electronically Signed   By: Jules Schick M.D.   On: 12/04/2023 11:34   CT Cervical Spine Wo Contrast Result Date: 12/04/2023 CLINICAL DATA:  Head trauma, minor (Age >= 65y); Neck trauma (Age >= 65y). EXAM: CT HEAD WITHOUT CONTRAST CT CERVICAL SPINE WITHOUT CONTRAST TECHNIQUE: Multidetector CT imaging of the head and cervical spine was performed following the standard protocol without intravenous contrast. Multiplanar CT image reconstructions of the cervical spine were also generated. RADIATION DOSE REDUCTION: This exam was performed according to the departmental dose-optimization program which includes automated exposure control, adjustment of the mA and/or kV according to patient size and/or use of iterative reconstruction technique. COMPARISON:  CT scan head from 04/11/2023. FINDINGS: CT HEAD FINDINGS Brain: No evidence of acute infarction, hemorrhage, hydrocephalus, extra-axial collection or mass lesion/mass effect. There is bilateral periventricular hypodensity, which is non-specific but most likely seen in the settings of microvascular ischemic changes. Moderate in extent. Otherwise normal appearance of brain parenchyma. Ventricles are normal. Cerebral volume is age appropriate. Vascular: No hyperdense vessel or unexpected calcification. Intracranial arteriosclerosis. Skull: Normal. Negative for fracture or focal lesion. Sinuses/Orbits:  No acute finding. Other: Visualized mastoid air cells are unremarkable. No mastoid effusion. CT CERVICAL SPINE FINDINGS Alignment: Normal.  This examination does not assess for ligamentous injury or stability. Skull base and vertebrae: No acute fracture. No primary bone lesion or focal pathologic process. Soft tissues and spinal canal: No prevertebral fluid or swelling. No visible canal hematoma. Disc levels: Mild-to-moderately reduced disc height of C4-5 through C7-T1 levels. Is mild multilevel facet arthropathy and moderate multilevel marginal osteophyte formation. Upper chest: Negative. Other: None. IMPRESSION: *No acute intracranial abnormality. *No acute osseous injury or traumatic listhesis of the cervical spine. Electronically Signed   By: Jules Schick M.D.   On: 12/04/2023 11:34   DG Pelvis 1-2 Views Result Date: 12/04/2023 CLINICAL DATA:  Status post fall from standing. EXAM: PELVIS - 1-2 VIEW COMPARISON:  10/01/2012 FINDINGS: No evidence for an acute fracture. SI joints and symphysis pubis unremarkable. Degenerative changes are noted in the hips, left greater than right. Heterotopic ossification in the region of the lesser trochanter of the left femoral neck is compatible with chronic/repetitive soft tissue injury. Evidence of previous ventral mesh placement noted over the lower pelvis. IMPRESSION: 1. No acute bony findings. 2. Degenerative changes in the hips, left greater than right. Electronically Signed   By: Kennith Center M.D.   On: 12/04/2023 11:26   DG Chest 1 View Result Date: 12/04/2023 CLINICAL DATA:  fall. EXAM: CHEST  1 VIEW COMPARISON:  04/11/2023. FINDINGS: Low lung volume. Bilateral lung fields are clear. Note is made of elevated right hemidiaphragm. Bilateral costophrenic angles are clear. Stable cardio-mediastinal silhouette. No acute osseous abnormalities. The soft tissues are within normal limits. IMPRESSION: No active disease. Electronically Signed   By: Jules Schick M.D.   On: 12/04/2023 11:26     Labs:   Basic Metabolic Panel: Recent Labs  Lab 12/06/23 1807 12/07/23 0622 12/08/23 0455 12/11/23 0525   NA 139 140 135 133*  K 4.6 4.6 4.7 4.4  CL 118* 119* 111 105  CO2 13* 16* 17* 18*  GLUCOSE 81 99 99 89  BUN 44* 38* 35* 28*  CREATININE 1.83* 1.81* 1.73* 1.64*  CALCIUM 8.4* 8.5* 8.3* 8.7*   GFR Estimated Creatinine Clearance: 24.4 mL/min (A) (by C-G formula based on SCr of 1.64 mg/dL (H)). Liver Function Tests: No results for input(s): "AST", "ALT", "ALKPHOS", "BILITOT", "PROT", "ALBUMIN" in the last 168 hours. No results for input(s): "LIPASE", "AMYLASE" in the last 168 hours. No results for input(s): "AMMONIA" in the last 168 hours. Coagulation profile No results for input(s): "INR", "PROTIME" in the last 168 hours.  CBC: Recent Labs  Lab 12/06/23 1807 12/07/23 0622 12/11/23 0525  WBC 9.6 9.2 10.1  NEUTROABS  --   --  7.7  HGB 8.4* 8.4* 8.9*  HCT 26.9* 26.6* 28.0*  MCV 90.9 90.8 88.9  PLT 298 290 267   Cardiac Enzymes: No results for input(s): "CKTOTAL", "CKMB", "CKMBINDEX", "TROPONINI" in the last 168 hours. BNP: Invalid input(s): "POCBNP" CBG: No results for input(s): "GLUCAP" in the last 168 hours. D-Dimer No results for input(s): "DDIMER" in the last 72 hours. Hgb A1c No results for input(s): "HGBA1C" in the last 72 hours. Lipid Profile No results for input(s): "CHOL", "HDL", "LDLCALC", "TRIG", "CHOLHDL", "LDLDIRECT" in the last 72 hours. Thyroid function studies No results for input(s): "TSH", "T4TOTAL", "T3FREE", "THYROIDAB" in the last 72 hours.  Invalid input(s): "FREET3" Anemia work up No results for input(s): "VITAMINB12", "FOLATE", "FERRITIN", "TIBC", "IRON", "RETICCTPCT" in the last 72 hours. Microbiology  Recent Results (from the past 240 hours)  Urine Culture     Status: Abnormal   Collection Time: 12/06/23  6:14 AM   Specimen: Urine, Clean Catch  Result Value Ref Range Status   Specimen Description URINE, CLEAN CATCH  Final   Special Requests   Final    NONE Performed at [facility], [address], [place], [place] [address]     Culture (A)  Final    >=100,000 COLONIES/mL PROTEUS MIRABILIS 10,000 COLONIES/mL ESCHERICHIA COLI    Report Status 12/11/2023 FINAL  Final   Organism ID, Bacteria PROTEUS MIRABILIS (A)  Final   Organism ID, Bacteria ESCHERICHIA COLI (A)  Final      Susceptibility   Escherichia coli - MIC*    AMPICILLIN >=32 RESISTANT Resistant     CEFAZOLIN <=4 SENSITIVE Sensitive     CEFEPIME <=0.12 SENSITIVE Sensitive     CEFTRIAXONE <=0.25 SENSITIVE Sensitive     CIPROFLOXACIN >=4 RESISTANT Resistant     GENTAMICIN <=1 SENSITIVE Sensitive     IMIPENEM <=0.25 SENSITIVE Sensitive     NITROFURANTOIN 32 SENSITIVE Sensitive     TRIMETH/SULFA <=20 SENSITIVE Sensitive     AMPICILLIN/SULBACTAM 16 INTERMEDIATE Intermediate     PIP/TAZO <=4 SENSITIVE Sensitive ug/mL    * 10,000 COLONIES/mL ESCHERICHIA COLI   Proteus mirabilis - MIC*    AMPICILLIN <=2 SENSITIVE Sensitive     CEFAZOLIN <=4 SENSITIVE Sensitive     CEFEPIME <=0.12 SENSITIVE Sensitive     CEFTRIAXONE <=0.25 SENSITIVE Sensitive     CIPROFLOXACIN <=0.25 SENSITIVE Sensitive     GENTAMICIN <=1 SENSITIVE Sensitive     IMIPENEM 2 SENSITIVE Sensitive     NITROFURANTOIN 128 RESISTANT Resistant     TRIMETH/SULFA <=20 SENSITIVE Sensitive     AMPICILLIN/SULBACTAM <=2 SENSITIVE Sensitive     PIP/TAZO <=4 SENSITIVE Sensitive ug/mL    * >=100,000 COLONIES/mL PROTEUS MIRABILIS  C Difficile Quick Screen w PCR reflex     Status: None   Collection Time: 12/07/23  1:10 AM   Specimen: STOOL  Result Value Ref Range Status   C Diff antigen NEGATIVE NEGATIVE Final   C Diff toxin NEGATIVE NEGATIVE Final   C Diff interpretation No C. difficile detected.  Final    Comment: Performed at [facility], [address] [place], [place] [address]    Time coordinating discharge: 45 minutes  Signed: Okley Magnussen  [facility] 12/12/2023, 1:06 PM

## 2023-12-12 NOTE — Progress Notes (Signed)
Attempted to call report to nurse at [facility]. Unable to reach nurse at this time.

## 2023-12-12 NOTE — Progress Notes (Signed)
Physical Therapy Treatment Patient Details Name: Jon Smith MRN: [id number] DOB: [birth year] Today's Date: 12/12/2023   History of Present Illness 89 y.o. male presents to [facility] from [place] on 12/04/23 after falling multiple times in one day with earlier admit to [facility]. Admitted w/ AKI on CKD and UTI. PMH: CKD, HTN, HLD, PrCA, anemia, PAD, back pain    PT Comments  Pt in bed upon arrival and agreeable to PT session. Initially, pt was agreeable to sit on EOB and work on transfers. However, pt required assist for pericare and linen change requiring multiple rolls. Pt was then agreeable to perform two exercises in supine before declining further mobility. Pt is progressing slowly towards goals. Acute PT to follow.      If plan is discharge home, recommend the following: A little help with walking and/or transfers;A little help with bathing/dressing/bathroom;Assist for transportation;Help with stairs or ramp for entrance   Can travel by private vehicle     No  Equipment Recommendations  None recommended by PT       Precautions / Restrictions Precautions Precautions: Fall Restrictions Weight Bearing Restrictions Per Provider Order: No     Mobility  Bed Mobility Overal bed mobility: Needs Assistance Bed Mobility: Rolling Rolling: Used rails, Mod assist         General bed mobility comments: ModA to roll bilaterally for pericare and linen change. Pt declined further mobility and PT session after rolling. TotalAx2 to scoot towards HOB in supine    Transfers    General transfer comment: pt declined        Balance    Sitting balance - Comments: not observed      Standing balance comment: Not observed      Cognition Arousal: Alert Behavior During Therapy: WFL for tasks assessed/performed Overall Cognitive Status: History of cognitive impairments - at baseline    General Comments: able to follow single step commands, HOH        Exercises General Exercises -  Lower Extremity Heel Slides: AAROM, Both, 10 reps, Supine Hip Flexion/Marching: AAROM, Both, 5 reps, Supine    General Comments General comments (skin integrity, edema, etc.): VSS on RA, wounds on sacrum covered w/ sacral heart      Pertinent Vitals/Pain Pain Assessment Pain Assessment: Faces Faces Pain Scale: Hurts little more Pain Location: generalized with mobility Pain Descriptors / Indicators: Aching, Discomfort Pain Intervention(s): Limited activity within patient's tolerance, Monitored during session, Repositioned           PT Goals (current goals can now be found in the care plan section) Acute Rehab PT Goals PT Goal Formulation: With patient Time For Goal Achievement: 12/19/23 Potential to Achieve Goals: Fair Progress towards PT goals: Progressing toward goals    Frequency    Min 1X/week              AM-PAC PT "6 Clicks" Mobility   Outcome Measure  Help needed turning from your back to your side while in a flat bed without using bedrails?: A Lot Help needed moving from lying on your back to sitting on the side of a flat bed without using bedrails?: A Lot Help needed moving to and from a bed to a chair (including a wheelchair)?: A Lot Help needed standing up from a chair using your arms (e.g., wheelchair or bedside chair)?: Total Help needed to walk in hospital room?: Total Help needed climbing 3-5 steps with a railing? : Total 6 Click Score: 9    End  of Session   Activity Tolerance: Other (comment) (pt declined further mobility) Patient left: in bed;with call bell/phone within reach;with bed alarm set Nurse Communication: Mobility status PT Visit Diagnosis: Unsteadiness on feet (R26.81);Muscle weakness (generalized) (M62.81)     Time: 8756-4332 PT Time Calculation (min) (ACUTE ONLY): 14 min  Charges:    $Therapeutic Activity: 8-22 mins PT General Charges $$ ACUTE PT VISIT: 1 Visit                     Hilton Cork, PT, DPT Secure Chat Preferred   Rehab Office [phone]    Arturo Morton Brion Aliment 12/12/2023, 10:53 AM

## 2023-12-12 NOTE — TOC Transition Note (Signed)
Transition of Care [facility]) - Discharge Note   Patient Details  Name: Jon Smith MRN: [id number] Date of Birth: 09-10-1928  Transition of [facility]) CM/SW Contact:  Lorri Frederick, LCSW Phone Number: 12/12/2023, 1:43 PM   Clinical Narrative:   Pt discharging to [facility], room 610A.  RN call report to [phone].     Final next level of care: Skilled Nursing Facility Barriers to Discharge: Barriers Resolved   Patient Goals and CMS Choice     Choice offered to / list presented to : Adult Children (son [name])      Discharge Placement              Patient chooses bed at: [facility] Patient to be transferred to facility by: [facility] Name of family member notified: son [name] Patient and family notified of of transfer: 12/12/23  Discharge Plan and Services Additional resources added to the After Visit Summary for   In-house Referral: Clinical Social Work   Post Acute Care Choice: Skilled Nursing Facility                               Social Drivers of Health (SDOH) Interventions SDOH Screenings   Food Insecurity: Patient Unable To Answer (11/25/2023)  Housing: Patient Unable To Answer (04/11/2023)  Transportation Needs: No Transportation Needs (11/25/2023)  Utilities: Not At Risk (11/25/2023)  Financial Resource Strain: High Risk (07/25/2020)   Received from [facility], [facility]  Tobacco Use: Medium Risk (12/05/2023)     Readmission Risk Interventions     No data to display

## 2023-12-12 NOTE — Discharge Instructions (Signed)
General discharge instructions: Follow with Primary MD [name], NP in 7 days  Please request your PCP  to go over your hospital tests, procedures, radiology results at the follow up. Please get your medicines reviewed and adjusted.  Your PCP may decide to repeat certain labs or tests as needed. Do not drive, operate heavy machinery, perform activities at heights, swimming or participation in water activities or provide baby sitting services if your were admitted for syncope or siezures until you have seen by Primary MD or a Neurologist and advised to do so again. [place] Controlled Substance Reporting System database was reviewed. Do not drive, operate heavy machinery, perform activities at heights, swim, participate in water activities or provide baby-sitting services while on medications for pain, sleep and mood until your outpatient physician has reevaluated you and advised to do so again.  You are strongly recommended to comply with the dose, frequency and duration of prescribed medications. Activity: As tolerated with Full fall precautions use walker/cane & assistance as needed Avoid using any recreational substances like cigarette, tobacco, alcohol, or non-prescribed drug. If you experience worsening of your admission symptoms, develop shortness of breath, life threatening emergency, suicidal or homicidal thoughts you must seek medical attention immediately by calling 911 or calling your MD immediately  if symptoms less severe. You must read complete instructions/literature along with all the possible adverse reactions/side effects for all the medicines you take and that have been prescribed to you. Take any new medicine only after you have completely understood and accepted all the possible adverse reactions/side effects.  Wear Seat belts while driving. You were cared for by a hospitalist during your hospital stay. If you have any questions about your discharge medications or the  care you received while you were in the hospital after you are discharged, you can call the unit and ask to speak with the hospitalist or the covering physician. Once you are discharged, your primary care physician will handle any further medical issues. Please note that NO REFILLS for any discharge medications will be authorized once you are discharged, as it is imperative that you return to your primary care physician (or establish a relationship with a primary care physician if you do not have one).

## 2023-12-13 DIAGNOSIS — N189 Chronic kidney disease, unspecified: Secondary | ICD-10-CM | POA: Diagnosis not present

## 2023-12-13 DIAGNOSIS — N400 Enlarged prostate without lower urinary tract symptoms: Secondary | ICD-10-CM | POA: Diagnosis not present

## 2023-12-13 DIAGNOSIS — E782 Mixed hyperlipidemia: Secondary | ICD-10-CM | POA: Diagnosis not present

## 2023-12-13 DIAGNOSIS — E559 Vitamin D deficiency, unspecified: Secondary | ICD-10-CM | POA: Diagnosis not present

## 2023-12-13 DIAGNOSIS — I639 Cerebral infarction, unspecified: Secondary | ICD-10-CM | POA: Diagnosis not present

## 2023-12-13 DIAGNOSIS — E119 Type 2 diabetes mellitus without complications: Secondary | ICD-10-CM | POA: Diagnosis not present

## 2023-12-13 DIAGNOSIS — E7849 Other hyperlipidemia: Secondary | ICD-10-CM | POA: Diagnosis not present

## 2023-12-13 DIAGNOSIS — I70223 Atherosclerosis of native arteries of extremities with rest pain, bilateral legs: Secondary | ICD-10-CM | POA: Diagnosis not present

## 2023-12-13 DIAGNOSIS — D508 Other iron deficiency anemias: Secondary | ICD-10-CM | POA: Diagnosis not present

## 2023-12-13 DIAGNOSIS — E038 Other specified hypothyroidism: Secondary | ICD-10-CM | POA: Diagnosis not present

## 2023-12-13 DIAGNOSIS — D518 Other vitamin B12 deficiency anemias: Secondary | ICD-10-CM | POA: Diagnosis not present

## 2023-12-13 DIAGNOSIS — I10 Essential (primary) hypertension: Secondary | ICD-10-CM | POA: Diagnosis not present

## 2023-12-15 DIAGNOSIS — R296 Repeated falls: Secondary | ICD-10-CM | POA: Diagnosis not present

## 2023-12-17 DIAGNOSIS — I10 Essential (primary) hypertension: Secondary | ICD-10-CM | POA: Diagnosis not present

## 2023-12-18 DIAGNOSIS — I739 Peripheral vascular disease, unspecified: Secondary | ICD-10-CM | POA: Diagnosis not present

## 2023-12-18 DIAGNOSIS — F17200 Nicotine dependence, unspecified, uncomplicated: Secondary | ICD-10-CM | POA: Diagnosis not present

## 2023-12-18 DIAGNOSIS — L89622 Pressure ulcer of left heel, stage 2: Secondary | ICD-10-CM | POA: Diagnosis not present

## 2023-12-18 DIAGNOSIS — M6281 Muscle weakness (generalized): Secondary | ICD-10-CM | POA: Diagnosis not present

## 2023-12-18 DIAGNOSIS — F331 Major depressive disorder, recurrent, moderate: Secondary | ICD-10-CM | POA: Diagnosis not present

## 2023-12-19 DIAGNOSIS — D649 Anemia, unspecified: Secondary | ICD-10-CM | POA: Diagnosis not present

## 2023-12-22 DIAGNOSIS — E785 Hyperlipidemia, unspecified: Secondary | ICD-10-CM | POA: Diagnosis not present

## 2023-12-22 DIAGNOSIS — M6281 Muscle weakness (generalized): Secondary | ICD-10-CM | POA: Diagnosis not present

## 2023-12-23 DIAGNOSIS — I10 Essential (primary) hypertension: Secondary | ICD-10-CM | POA: Diagnosis not present

## 2023-12-25 DIAGNOSIS — N179 Acute kidney failure, unspecified: Secondary | ICD-10-CM | POA: Diagnosis not present

## 2023-12-25 DIAGNOSIS — M6281 Muscle weakness (generalized): Secondary | ICD-10-CM | POA: Diagnosis not present

## 2023-12-26 DIAGNOSIS — F331 Major depressive disorder, recurrent, moderate: Secondary | ICD-10-CM | POA: Diagnosis not present

## 2023-12-29 DIAGNOSIS — D508 Other iron deficiency anemias: Secondary | ICD-10-CM | POA: Diagnosis not present

## 2023-12-29 DIAGNOSIS — I10 Essential (primary) hypertension: Secondary | ICD-10-CM | POA: Diagnosis not present

## 2024-01-01 DIAGNOSIS — N400 Enlarged prostate without lower urinary tract symptoms: Secondary | ICD-10-CM | POA: Diagnosis not present

## 2024-01-01 DIAGNOSIS — I739 Peripheral vascular disease, unspecified: Secondary | ICD-10-CM | POA: Diagnosis not present

## 2024-01-01 DIAGNOSIS — E785 Hyperlipidemia, unspecified: Secondary | ICD-10-CM | POA: Diagnosis not present

## 2024-01-01 DIAGNOSIS — F17200 Nicotine dependence, unspecified, uncomplicated: Secondary | ICD-10-CM | POA: Diagnosis not present

## 2024-01-01 DIAGNOSIS — L89616 Pressure-induced deep tissue damage of right heel: Secondary | ICD-10-CM | POA: Diagnosis not present

## 2024-01-01 DIAGNOSIS — M6281 Muscle weakness (generalized): Secondary | ICD-10-CM | POA: Diagnosis not present

## 2024-01-02 DIAGNOSIS — R454 Irritability and anger: Secondary | ICD-10-CM | POA: Diagnosis not present

## 2024-01-05 DIAGNOSIS — D508 Other iron deficiency anemias: Secondary | ICD-10-CM | POA: Diagnosis not present

## 2024-01-05 DIAGNOSIS — N179 Acute kidney failure, unspecified: Secondary | ICD-10-CM | POA: Diagnosis not present

## 2024-01-08 DIAGNOSIS — F17200 Nicotine dependence, unspecified, uncomplicated: Secondary | ICD-10-CM | POA: Diagnosis not present

## 2024-01-08 DIAGNOSIS — I739 Peripheral vascular disease, unspecified: Secondary | ICD-10-CM | POA: Diagnosis not present

## 2024-01-08 DIAGNOSIS — L89622 Pressure ulcer of left heel, stage 2: Secondary | ICD-10-CM | POA: Diagnosis not present

## 2024-01-08 DIAGNOSIS — M6281 Muscle weakness (generalized): Secondary | ICD-10-CM | POA: Diagnosis not present

## 2024-01-13 DIAGNOSIS — R058 Other specified cough: Secondary | ICD-10-CM | POA: Diagnosis not present

## 2024-01-15 DIAGNOSIS — I739 Peripheral vascular disease, unspecified: Secondary | ICD-10-CM | POA: Diagnosis not present

## 2024-01-15 DIAGNOSIS — F17200 Nicotine dependence, unspecified, uncomplicated: Secondary | ICD-10-CM | POA: Diagnosis not present

## 2024-01-15 DIAGNOSIS — N400 Enlarged prostate without lower urinary tract symptoms: Secondary | ICD-10-CM | POA: Diagnosis not present

## 2024-01-15 DIAGNOSIS — L89622 Pressure ulcer of left heel, stage 2: Secondary | ICD-10-CM | POA: Diagnosis not present

## 2024-01-15 DIAGNOSIS — M6281 Muscle weakness (generalized): Secondary | ICD-10-CM | POA: Diagnosis not present

## 2024-01-15 DIAGNOSIS — M6259 Muscle wasting and atrophy, not elsewhere classified, multiple sites: Secondary | ICD-10-CM | POA: Diagnosis not present

## 2024-01-15 DIAGNOSIS — L89152 Pressure ulcer of sacral region, stage 2: Secondary | ICD-10-CM | POA: Diagnosis not present

## 2024-01-15 DIAGNOSIS — R296 Repeated falls: Secondary | ICD-10-CM | POA: Diagnosis not present

## 2024-01-16 DIAGNOSIS — R296 Repeated falls: Secondary | ICD-10-CM | POA: Diagnosis not present

## 2024-01-17 DIAGNOSIS — J189 Pneumonia, unspecified organism: Secondary | ICD-10-CM | POA: Diagnosis not present

## 2024-01-17 DIAGNOSIS — R918 Other nonspecific abnormal finding of lung field: Secondary | ICD-10-CM | POA: Diagnosis not present

## 2024-01-18 DIAGNOSIS — N390 Urinary tract infection, site not specified: Secondary | ICD-10-CM | POA: Diagnosis not present

## 2024-01-19 DIAGNOSIS — J181 Lobar pneumonia, unspecified organism: Secondary | ICD-10-CM | POA: Diagnosis not present

## 2024-01-19 DIAGNOSIS — L97929 Non-pressure chronic ulcer of unspecified part of left lower leg with unspecified severity: Secondary | ICD-10-CM | POA: Diagnosis not present

## 2024-01-19 DIAGNOSIS — I70203 Unspecified atherosclerosis of native arteries of extremities, bilateral legs: Secondary | ICD-10-CM | POA: Diagnosis not present

## 2024-01-19 DIAGNOSIS — E871 Hypo-osmolality and hyponatremia: Secondary | ICD-10-CM | POA: Diagnosis not present

## 2024-01-19 DIAGNOSIS — L97919 Non-pressure chronic ulcer of unspecified part of right lower leg with unspecified severity: Secondary | ICD-10-CM | POA: Diagnosis not present

## 2024-01-20 DIAGNOSIS — R296 Repeated falls: Secondary | ICD-10-CM | POA: Diagnosis not present

## 2024-01-20 DIAGNOSIS — I10 Essential (primary) hypertension: Secondary | ICD-10-CM | POA: Diagnosis not present

## 2024-01-22 DIAGNOSIS — F17200 Nicotine dependence, unspecified, uncomplicated: Secondary | ICD-10-CM | POA: Diagnosis not present

## 2024-01-22 DIAGNOSIS — L89612 Pressure ulcer of right heel, stage 2: Secondary | ICD-10-CM | POA: Diagnosis not present

## 2024-01-22 DIAGNOSIS — I739 Peripheral vascular disease, unspecified: Secondary | ICD-10-CM | POA: Diagnosis not present

## 2024-01-22 DIAGNOSIS — I10 Essential (primary) hypertension: Secondary | ICD-10-CM | POA: Diagnosis not present

## 2024-01-22 DIAGNOSIS — M6281 Muscle weakness (generalized): Secondary | ICD-10-CM | POA: Diagnosis not present

## 2024-01-23 DIAGNOSIS — E871 Hypo-osmolality and hyponatremia: Secondary | ICD-10-CM | POA: Diagnosis not present

## 2024-01-23 DIAGNOSIS — E8809 Other disorders of plasma-protein metabolism, not elsewhere classified: Secondary | ICD-10-CM | POA: Diagnosis not present

## 2024-01-26 DIAGNOSIS — E871 Hypo-osmolality and hyponatremia: Secondary | ICD-10-CM | POA: Diagnosis not present

## 2024-01-26 DIAGNOSIS — R112 Nausea with vomiting, unspecified: Secondary | ICD-10-CM | POA: Diagnosis not present

## 2024-01-27 DIAGNOSIS — D72829 Elevated white blood cell count, unspecified: Secondary | ICD-10-CM | POA: Diagnosis not present

## 2024-01-29 DIAGNOSIS — R627 Adult failure to thrive: Secondary | ICD-10-CM | POA: Diagnosis not present

## 2024-01-29 DIAGNOSIS — J986 Disorders of diaphragm: Secondary | ICD-10-CM | POA: Diagnosis not present

## 2024-01-29 DIAGNOSIS — N390 Urinary tract infection, site not specified: Secondary | ICD-10-CM | POA: Diagnosis not present

## 2024-01-29 DIAGNOSIS — D72829 Elevated white blood cell count, unspecified: Secondary | ICD-10-CM | POA: Diagnosis not present

## 2024-01-29 DIAGNOSIS — J984 Other disorders of lung: Secondary | ICD-10-CM | POA: Diagnosis not present

## 2024-01-29 DIAGNOSIS — J90 Pleural effusion, not elsewhere classified: Secondary | ICD-10-CM | POA: Diagnosis not present

## 2024-01-30 ENCOUNTER — Inpatient Hospital Stay
Admission: EM | Admit: 2024-01-30 | Discharge: 2024-02-04 | DRG: 871 | Disposition: A | Discharge status: Hospice | Payer: Medicare HMO | Source: Skilled Nursing Facility | Attending: Osteopathic Medicine | Admitting: Osteopathic Medicine

## 2024-01-30 ENCOUNTER — Emergency Department: Payer: Medicare HMO

## 2024-01-30 DIAGNOSIS — I129 Hypertensive chronic kidney disease with stage 1 through stage 4 chronic kidney disease, or unspecified chronic kidney disease: Secondary | ICD-10-CM | POA: Diagnosis not present

## 2024-01-30 DIAGNOSIS — Z66 Do not resuscitate: Secondary | ICD-10-CM | POA: Diagnosis present

## 2024-01-30 DIAGNOSIS — N1832 Chronic kidney disease, stage 3b: Secondary | ICD-10-CM | POA: Diagnosis not present

## 2024-01-30 DIAGNOSIS — Z8249 Family history of ischemic heart disease and other diseases of the circulatory system: Secondary | ICD-10-CM

## 2024-01-30 DIAGNOSIS — Z1152 Encounter for screening for COVID-19: Secondary | ICD-10-CM | POA: Diagnosis not present

## 2024-01-30 DIAGNOSIS — D529 Folate deficiency anemia, unspecified: Secondary | ICD-10-CM | POA: Diagnosis present

## 2024-01-30 DIAGNOSIS — E870 Hyperosmolality and hypernatremia: Secondary | ICD-10-CM | POA: Diagnosis present

## 2024-01-30 DIAGNOSIS — D649 Anemia, unspecified: Secondary | ICD-10-CM | POA: Diagnosis not present

## 2024-01-30 DIAGNOSIS — N170 Acute kidney failure with tubular necrosis: Secondary | ICD-10-CM | POA: Diagnosis present

## 2024-01-30 DIAGNOSIS — E8721 Acute metabolic acidosis: Secondary | ICD-10-CM | POA: Diagnosis present

## 2024-01-30 DIAGNOSIS — R053 Chronic cough: Secondary | ICD-10-CM | POA: Diagnosis present

## 2024-01-30 DIAGNOSIS — J189 Pneumonia, unspecified organism: Secondary | ICD-10-CM | POA: Diagnosis not present

## 2024-01-30 DIAGNOSIS — E860 Dehydration: Secondary | ICD-10-CM | POA: Diagnosis present

## 2024-01-30 DIAGNOSIS — E1151 Type 2 diabetes mellitus with diabetic peripheral angiopathy without gangrene: Secondary | ICD-10-CM | POA: Diagnosis present

## 2024-01-30 DIAGNOSIS — D508 Other iron deficiency anemias: Secondary | ICD-10-CM | POA: Diagnosis present

## 2024-01-30 DIAGNOSIS — Z515 Encounter for palliative care: Secondary | ICD-10-CM | POA: Diagnosis not present

## 2024-01-30 DIAGNOSIS — R0689 Other abnormalities of breathing: Secondary | ICD-10-CM | POA: Diagnosis not present

## 2024-01-30 DIAGNOSIS — Z87442 Personal history of urinary calculi: Secondary | ICD-10-CM

## 2024-01-30 DIAGNOSIS — N179 Acute kidney failure, unspecified: Secondary | ICD-10-CM | POA: Diagnosis not present

## 2024-01-30 DIAGNOSIS — N400 Enlarged prostate without lower urinary tract symptoms: Secondary | ICD-10-CM | POA: Diagnosis present

## 2024-01-30 DIAGNOSIS — F0390 Unspecified dementia without behavioral disturbance: Secondary | ICD-10-CM | POA: Diagnosis present

## 2024-01-30 DIAGNOSIS — Y95 Nosocomial condition: Secondary | ICD-10-CM | POA: Diagnosis present

## 2024-01-30 DIAGNOSIS — A419 Sepsis, unspecified organism: Secondary | ICD-10-CM | POA: Diagnosis not present

## 2024-01-30 DIAGNOSIS — D631 Anemia in chronic kidney disease: Secondary | ICD-10-CM | POA: Diagnosis present

## 2024-01-30 DIAGNOSIS — F03B0 Unspecified dementia, moderate, without behavioral disturbance, psychotic disturbance, mood disturbance, and anxiety: Secondary | ICD-10-CM | POA: Diagnosis not present

## 2024-01-30 DIAGNOSIS — Z79899 Other long term (current) drug therapy: Secondary | ICD-10-CM

## 2024-01-30 DIAGNOSIS — R918 Other nonspecific abnormal finding of lung field: Secondary | ICD-10-CM | POA: Diagnosis not present

## 2024-01-30 DIAGNOSIS — Z743 Need for continuous supervision: Secondary | ICD-10-CM | POA: Diagnosis not present

## 2024-01-30 DIAGNOSIS — N189 Chronic kidney disease, unspecified: Secondary | ICD-10-CM | POA: Diagnosis not present

## 2024-01-30 DIAGNOSIS — I6381 Other cerebral infarction due to occlusion or stenosis of small artery: Secondary | ICD-10-CM | POA: Diagnosis not present

## 2024-01-30 DIAGNOSIS — E8722 Chronic metabolic acidosis: Secondary | ICD-10-CM | POA: Diagnosis present

## 2024-01-30 DIAGNOSIS — Z7982 Long term (current) use of aspirin: Secondary | ICD-10-CM

## 2024-01-30 DIAGNOSIS — Z6841 Body Mass Index (BMI) 40.0 and over, adult: Secondary | ICD-10-CM | POA: Diagnosis not present

## 2024-01-30 DIAGNOSIS — R451 Restlessness and agitation: Secondary | ICD-10-CM | POA: Diagnosis present

## 2024-01-30 DIAGNOSIS — R911 Solitary pulmonary nodule: Secondary | ICD-10-CM | POA: Diagnosis present

## 2024-01-30 DIAGNOSIS — R456 Violent behavior: Secondary | ICD-10-CM | POA: Diagnosis not present

## 2024-01-30 DIAGNOSIS — Z9181 History of falling: Secondary | ICD-10-CM

## 2024-01-30 DIAGNOSIS — Z5986 Financial insecurity: Secondary | ICD-10-CM

## 2024-01-30 DIAGNOSIS — Z87891 Personal history of nicotine dependence: Secondary | ICD-10-CM

## 2024-01-30 DIAGNOSIS — Z6820 Body mass index (BMI) 20.0-20.9, adult: Secondary | ICD-10-CM

## 2024-01-30 DIAGNOSIS — E785 Hyperlipidemia, unspecified: Secondary | ICD-10-CM | POA: Diagnosis present

## 2024-01-30 DIAGNOSIS — R636 Underweight: Secondary | ICD-10-CM | POA: Diagnosis present

## 2024-01-30 DIAGNOSIS — E1122 Type 2 diabetes mellitus with diabetic chronic kidney disease: Secondary | ICD-10-CM | POA: Diagnosis not present

## 2024-01-30 DIAGNOSIS — R6520 Severe sepsis without septic shock: Secondary | ICD-10-CM | POA: Diagnosis present

## 2024-01-30 DIAGNOSIS — I672 Cerebral atherosclerosis: Secondary | ICD-10-CM | POA: Diagnosis not present

## 2024-01-30 DIAGNOSIS — Z882 Allergy status to sulfonamides status: Secondary | ICD-10-CM

## 2024-01-30 DIAGNOSIS — I959 Hypotension, unspecified: Secondary | ICD-10-CM | POA: Diagnosis not present

## 2024-01-30 DIAGNOSIS — E871 Hypo-osmolality and hyponatremia: Secondary | ICD-10-CM | POA: Diagnosis not present

## 2024-01-30 DIAGNOSIS — Z8546 Personal history of malignant neoplasm of prostate: Secondary | ICD-10-CM

## 2024-01-30 DIAGNOSIS — R627 Adult failure to thrive: Secondary | ICD-10-CM | POA: Diagnosis not present

## 2024-01-30 DIAGNOSIS — Z886 Allergy status to analgesic agent status: Secondary | ICD-10-CM

## 2024-01-30 DIAGNOSIS — Z87440 Personal history of urinary (tract) infections: Secondary | ICD-10-CM

## 2024-01-30 DIAGNOSIS — R4182 Altered mental status, unspecified: Secondary | ICD-10-CM | POA: Diagnosis not present

## 2024-01-30 DIAGNOSIS — D72829 Elevated white blood cell count, unspecified: Secondary | ICD-10-CM | POA: Diagnosis not present

## 2024-01-30 DIAGNOSIS — J159 Unspecified bacterial pneumonia: Secondary | ICD-10-CM | POA: Diagnosis not present

## 2024-01-30 LAB — CBC WITH DIFFERENTIAL/PLATELET
Abs Immature Granulocytes: 0.12 10*3/uL — ABNORMAL HIGH (ref 0.00–0.07)
Basophils Absolute: 0.1 10*3/uL (ref 0.0–0.1)
Basophils Relative: 0 %
Eosinophils Absolute: 0 10*3/uL (ref 0.0–0.5)
Eosinophils Relative: 0 %
HCT: 25.8 % — ABNORMAL LOW (ref 39.0–52.0)
Hemoglobin: 8.3 g/dL — ABNORMAL LOW (ref 13.0–17.0)
Immature Granulocytes: 1 %
Lymphocytes Relative: 4 %
Lymphs Abs: 0.8 10*3/uL (ref 0.7–4.0)
MCH: 27.9 pg (ref 26.0–34.0)
MCHC: 32.2 g/dL (ref 30.0–36.0)
MCV: 86.6 fL (ref 80.0–100.0)
Monocytes Absolute: 1.2 10*3/uL — ABNORMAL HIGH (ref 0.1–1.0)
Monocytes Relative: 6 %
Neutro Abs: 17.2 10*3/uL — ABNORMAL HIGH (ref 1.7–7.7)
Neutrophils Relative %: 89 %
Platelets: 512 10*3/uL — ABNORMAL HIGH (ref 150–400)
RBC: 2.98 MIL/uL — ABNORMAL LOW (ref 4.22–5.81)
RDW: 17.6 % — ABNORMAL HIGH (ref 11.5–15.5)
WBC: 19.4 10*3/uL — ABNORMAL HIGH (ref 4.0–10.5)
nRBC: 0.1 % (ref 0.0–0.2)

## 2024-01-30 LAB — COMPREHENSIVE METABOLIC PANEL
ALT: 26 U/L (ref 0–44)
AST: 43 U/L — ABNORMAL HIGH (ref 15–41)
Albumin: 2.6 g/dL — ABNORMAL LOW (ref 3.5–5.0)
Alkaline Phosphatase: 73 U/L (ref 38–126)
Anion gap: 18 — ABNORMAL HIGH (ref 5–15)
BUN: 110 mg/dL — ABNORMAL HIGH (ref 8–23)
CO2: 16 mmol/L — ABNORMAL LOW (ref 22–32)
Calcium: 8.5 mg/dL — ABNORMAL LOW (ref 8.9–10.3)
Chloride: 115 mmol/L — ABNORMAL HIGH (ref 98–111)
Creatinine, Ser: 5.16 mg/dL — ABNORMAL HIGH (ref 0.61–1.24)
GFR, Estimated: 10 mL/min — ABNORMAL LOW (ref >60)
Glucose, Bld: 86 mg/dL (ref 70–99)
Potassium: 5 mmol/L (ref 3.5–5.1)
Sodium: 149 mmol/L — ABNORMAL HIGH (ref 135–145)
Total Bilirubin: 0.9 mg/dL (ref 0.0–1.2)
Total Protein: 7 g/dL (ref 6.5–8.1)

## 2024-01-30 LAB — RESP PANEL BY RT-PCR (RSV, FLU A&B, COVID)  RVPGX2
Influenza A by PCR: NEGATIVE
Influenza B by PCR: NEGATIVE
Resp Syncytial Virus by PCR: NEGATIVE
SARS Coronavirus 2 by RT PCR: NEGATIVE

## 2024-01-30 LAB — LACTIC ACID, PLASMA: Lactic Acid, Venous: 1.1 mmol/L (ref 0.5–1.9)

## 2024-01-30 MED ORDER — SODIUM CHLORIDE 0.9 % IV BOLUS (SEPSIS)
1000.0000 mL | Freq: Once | INTRAVENOUS | Status: AC
Start: 2024-01-30 — End: 2024-01-30
  Administered 2024-01-30: 1000 mL via INTRAVENOUS

## 2024-01-30 MED ORDER — TAMSULOSIN HCL 0.4 MG PO CAPS
0.4000 mg | ORAL_CAPSULE | Freq: Every day | ORAL | Status: DC
  Administered 2024-01-31: 0.4 mg via ORAL
  Filled 2024-01-30: qty 1

## 2024-01-30 MED ORDER — CLOPIDOGREL BISULFATE 75 MG PO TABS
75.0000 mg | ORAL_TABLET | Freq: Every day | ORAL | Status: DC
  Administered 2024-01-30 – 2024-01-31 (×2): 75 mg via ORAL
  Filled 2024-01-30 (×2): qty 1

## 2024-01-30 MED ORDER — FINASTERIDE 5 MG PO TABS
5.0000 mg | ORAL_TABLET | Freq: Every day | ORAL | Status: DC
  Administered 2024-01-31: 5 mg via ORAL
  Filled 2024-01-30: qty 1

## 2024-01-30 MED ORDER — VANCOMYCIN HCL IN DEXTROSE 1-5 GM/200ML-% IV SOLN
1000.0000 mg | Freq: Once | INTRAVENOUS | Status: AC
  Administered 2024-01-30: 1000 mg via INTRAVENOUS
  Filled 2024-01-30: qty 200

## 2024-01-30 MED ORDER — SODIUM CHLORIDE 0.9 % IV SOLN
2.0000 g | Freq: Once | INTRAVENOUS | Status: AC
  Administered 2024-01-30: 2 g via INTRAVENOUS
  Filled 2024-01-30: qty 12.5

## 2024-01-30 MED ORDER — VANCOMYCIN VARIABLE DOSE PER UNSTABLE RENAL FUNCTION (PHARMACIST DOSING): Status: DC

## 2024-01-30 MED ORDER — SERTRALINE HCL 50 MG PO TABS
50.0000 mg | ORAL_TABLET | Freq: Every day | ORAL | Status: DC
  Administered 2024-01-31 (×2): 50 mg via ORAL
  Filled 2024-01-30 (×3): qty 1

## 2024-01-30 MED ORDER — FOLIC ACID 1 MG PO TABS
1.0000 mg | ORAL_TABLET | Freq: Every day | ORAL | Status: DC
  Administered 2024-01-31: 1 mg via ORAL
  Filled 2024-01-30: qty 1

## 2024-01-30 MED ORDER — FERROUS SULFATE 325 (65 FE) MG PO TABS
325.0000 mg | ORAL_TABLET | Freq: Every day | ORAL | Status: DC
  Administered 2024-01-31: 325 mg via ORAL
  Filled 2024-01-30: qty 1

## 2024-01-30 MED ORDER — SODIUM CHLORIDE 0.9 % IV BOLUS (SEPSIS)
1000.0000 mL | Freq: Once | INTRAVENOUS | Status: AC
  Administered 2024-01-30: 1000 mL via INTRAVENOUS

## 2024-01-30 MED ORDER — ATORVASTATIN CALCIUM 20 MG PO TABS
40.0000 mg | ORAL_TABLET | Freq: Every day | ORAL | Status: DC
  Administered 2024-01-31 (×2): 40 mg via ORAL
  Filled 2024-01-30 (×3): qty 2

## 2024-01-30 MED ORDER — HEPARIN SODIUM (PORCINE) 5000 UNIT/ML IJ SOLN
5000.0000 [IU] | Freq: Three times a day (TID) | INTRAMUSCULAR | Status: DC
  Administered 2024-01-30 – 2024-02-04 (×14): 5000 [IU] via SUBCUTANEOUS
  Filled 2024-01-30 (×14): qty 1

## 2024-01-30 MED ORDER — LACTATED RINGERS IV SOLN: INTRAVENOUS | Status: AC

## 2024-01-30 MED ORDER — VITAMIN B-12 1000 MCG PO TABS
1000.0000 ug | ORAL_TABLET | Freq: Every day | ORAL | Status: DC
  Administered 2024-01-31: 1000 ug via ORAL
  Filled 2024-01-30: qty 2

## 2024-01-30 NOTE — Consult Note (Signed)
CODE SEPSIS - PHARMACY COMMUNICATION  **Broad-spectrum antimicrobials should be administered within one hour of sepsis diagnosis**  Time Code Sepsis call or page was received: 1513  Antibiotics ordered: Cefepime, Vancomycin  Time of first antibiotic administration: 1547  Additional action taken by pharmacy: N/A  If necessary, name of provider/nurse contacted: N/A    Will M. Dareen Piano, PharmD Clinical Pharmacist 01/30/2024 4:17 PM

## 2024-01-30 NOTE — ED Provider Notes (Addendum)
[facility] Provider Note    Event Date/Time   First MD Initiated Contact with Patient 01/30/24 1505     (approximate)   History   Pneumonia   HPI  Jon Smith is a 89 y.o. male past medical history significant for dementia, CKD, who presents to the emergency department for cough.  Patient was sent in for concerns for the chest x-ray with worsening signs of pneumonia.  Patient arrives from [facility].  Patient has been treated from 1/20-20 26 with Levaquin.  Had a repeat chest x-ray today that was read for worsening pneumonia so he is brought to the emergency department.  Patient is not providing any other further history.  Does have reported history of urinary tract infections.  Arrives with a MOST form in place that shows full code.  Son [name] is listed in the chart.  Attempted to call the telephone number to discuss further details with no answer.  On chart review patient had a recent hospitalization within the past 2 months and was discharged on 12/13.  At that time his discharge CODE STATUS was noted as DNR limited and DO NOT INTUBATE.     Physical Exam   Triage Vital Signs: ED Triage Vitals [01/30/24 1217]  Encounter Vitals Group     BP (!) 115/49     Systolic BP Percentile      Diastolic BP Percentile      Pulse Rate 81     Resp (!) 25     Temp 97.7 F (36.5 C)     Temp Source Oral     SpO2 94 %     Weight 136 lb (61.7 kg)     Height      Head Circumference      [facility] Flow      Pain Score      Pain Loc      Pain Education      Exclude from Growth Chart     Most recent vital signs: Vitals:   01/30/24 1530 01/30/24 1556  BP: (!) 109/49   Pulse: 79 79  Resp: (!) 30 (!) 25  Temp:    SpO2: 100% 100%    Physical Exam Constitutional:      Appearance: He is well-developed. He is ill-appearing.  HENT:     Head: Atraumatic.     Comments: Dry mucous membranes Eyes:     Conjunctiva/sclera: Conjunctivae normal.      Comments: Pupils are unequal  Cardiovascular:     Rate and Rhythm: Regular rhythm.  Pulmonary:     Effort: No respiratory distress.     Breath sounds: Rhonchi present.  Abdominal:     Tenderness: There is no abdominal tenderness.  Musculoskeletal:     Cervical back: Normal range of motion.  Skin:    General: Skin is warm.     Capillary Refill: Capillary refill takes 2 to 3 seconds.  Neurological:     Mental Status: He is alert. Mental status is at baseline.     IMPRESSION / MDM / ASSESSMENT AND PLAN / ED COURSE  I reviewed the triage vital signs and the nursing notes.  Patient presents to the emergency department with worsening findings on chest x-ray.  Patient has completed a course of Levaquin per her report.  Patient arrives afebrile, tachypneic and 94% on room air.  No tachycardia and blood pressure is stable.  Differential diagnosis including progressively worsening pneumonia, dehydration, electrolyte abnormality, viral illness including COVID/influenza  EKG  I, Corena Herter, the attending physician, personally viewed and interpreted this ECG.   Rate: Normal  Rhythm: Normal sinus  Axis: Normal  Intervals: Normal  ST&T Change: None -nonspecific ST changes No significant change when compared to prior EKG  No tachycardic or bradycardic dysrhythmias while on cardiac telemetry.  RADIOLOGY I independently reviewed imaging, my interpretation of imaging: Chest x-ray with findings concerning for pneumonia.  LABS (all labs ordered are listed, but only abnormal results are displayed) Labs interpreted as -    Labs Reviewed  COMPREHENSIVE METABOLIC PANEL - Abnormal; Notable for the following components:      Result Value   Sodium 149 (*)    Chloride 115 (*)    CO2 16 (*)    BUN 110 (*)    Creatinine, Ser 5.16 (*)    Calcium 8.5 (*)    Albumin 2.6 (*)    AST 43 (*)    GFR, Estimated 10 (*)    Anion gap 18 (*)    All other components within normal limits  CBC WITH  DIFFERENTIAL/PLATELET - Abnormal; Notable for the following components:   WBC 19.4 (*)    RBC 2.98 (*)    Hemoglobin 8.3 (*)    HCT 25.8 (*)    RDW 17.6 (*)    Platelets 512 (*)    Neutro Abs 17.2 (*)    Monocytes Absolute 1.2 (*)    Abs Immature Granulocytes 0.12 (*)    All other components within normal limits  CULTURE, BLOOD (ROUTINE X 2)  CULTURE, BLOOD (ROUTINE X 2)  RESP PANEL BY RT-PCR (RSV, FLU A&B, COVID)  RVPGX2  LACTIC ACID, PLASMA     MDM    Blood cultures obtained.  Patient was leukocytosis.  Chronic anemia.  Creatinine significantly worsened with an acute on chronic kidney injury with a creatinine elevated up to 5.6 from a baseline of 1.6.  Significantly elevated BUN and appears prerenal.  CO2 is low at 16 but does have low normal lactic acid.  Does have an elevated anion gap.  Significantly elevated sodium level with hypernatremia of 149 which is likely in the setting of volume depletion.  Given progressively worsening pneumonia with significant leukocytosis and acute on chronic kidney injury we will broaden the patient's antibiotics out to cover for hospital-acquired pneumonia given that the patient has been on Levaquin as an outpatient.  Will obtain a urine.  Given his abnormal pupils will obtain CT scan of his head given that he has a history of frequent falls on chart review.  COVID and influenza testing obtained.  Patient given 2 L of IV fluids and started on maintenance rate.  Consulted hospitalist for admission for acute on chronic kidney injury and progressively worsening pneumonia.  Attempted to call the son however unable to get a hold of anyone.  Did not have a voicemail.  Patient has a MOST form in place that says full code however patient's last CODE STATUS on hospitalization was DNR limited.   PROCEDURES:  Critical Care performed: yes  .Critical Care  Performed by: Corena Herter, MD Authorized by: Corena Herter, MD   Critical care provider  statement:    Critical care time (minutes):  30   Critical care time was exclusive of:  Separately billable procedures and treating other patients   Critical care was necessary to treat or prevent imminent or life-threatening deterioration of the following conditions:  Sepsis   Critical care was time spent personally by me on the  following activities:  Development of treatment plan with patient or surrogate, discussions with consultants, evaluation of patient's response to treatment, examination of patient, ordering and review of laboratory studies, ordering and review of radiographic studies, ordering and performing treatments and interventions, pulse oximetry, re-evaluation of patient's condition and review of old charts   Care discussed with: admitting provider     Patient's presentation is most consistent with acute presentation with potential threat to life or bodily function.   MEDICATIONS ORDERED IN ED: Medications  lactated ringers infusion (has no administration in time range)  sodium chloride 0.9 % bolus 1,000 mL (1,000 mLs Intravenous New Bag/Given 01/30/24 1547)  vancomycin (VANCOCIN) IVPB 1000 mg/200 mL premix (has no administration in time range)  ceFEPIme (MAXIPIME) 2 g in sodium chloride 0.9 % 100 mL IVPB (2 g Intravenous New Bag/Given 01/30/24 1547)  sodium chloride 0.9 % bolus 1,000 mL (has no administration in time range)    FINAL CLINICAL IMPRESSION(S) / ED DIAGNOSES   Final diagnoses:  Acute renal failure superimposed on chronic kidney disease, unspecified acute renal failure type, unspecified CKD stage (HCC)  Pneumonia due to infectious organism, unspecified laterality, unspecified part of lung     Rx / DC Orders   ED Discharge Orders     None        Note:  This document was prepared using Dragon voice recognition software and may include unintentional dictation errors.   Corena Herter, MD 01/30/24 1539    Corena Herter, MD 01/30/24 1601

## 2024-01-30 NOTE — Consult Note (Signed)
Pharmacy Antibiotic Note  ASSESSMENT: 89 y.o. male with PMH including CKD3b, HLD, HTN, PAD is presenting with pneumonia / HCAP. Patient is in AKI. CXR shows bilateral PNA. He has leukocytosis but is VSS and on RA, Pharmacy has been consulted to manage cefepime and vancomycin dosing.  Patient measurements: Weight: 61.7 kg (136 lb)  Vital signs: Temp: 98 F (36.7 C) (01/31 1505) Temp Source: Oral (01/31 1505) BP: 129/58 (01/31 1600) Pulse Rate: 84 (01/31 1600) Recent Labs  Lab 01/30/24 1223  WBC 19.4*  CREATININE 5.16*   Estimated Creatinine Clearance: 7.6 mL/min (A) (by C-G formula based on SCr of 5.16 mg/dL (H)).  Allergies: Allergies  Allergen Reactions   Aspirin Nausea And Vomiting   Sulfamethoxazole-Trimethoprim     Other reaction(s): Blood Disorder Coagulopathy    Antimicrobials this admission: Cefepime 1/31 >> Vancomycin 1/31 >>  Dose adjustments this admission: N/A  Microbiology results: 1/31 BCx: NGTD 1/31 Flu/Covid/RSV: negative  PLAN: Initiate cefepime 1 g IV q24H Patient received vancomycin 1000 mg IV x 1 in ED. With current renal function, estimated half-life of vancomycin is 64 hours. This may of course shorten as patient's renal function improves. Will order a vanc random with AM labs on Sunday, 2/2 Follow up culture results to assess for antibiotic optimization. Monitor renal function to assess for any necessary antibiotic dosing changes.   Thank you for allowing pharmacy to be a part of this patient's care.  Will M. Dareen Piano, PharmD Clinical Pharmacist 01/30/2024 5:34 PM

## 2024-01-30 NOTE — H&P (Signed)
History and Physical    Patient: Jon Smith MWN:[id number] DOB: May 10, 1928 DOA: 01/30/2024 DOS: the patient was seen and examined on 01/30/2024 PCP: [name], NP  Patient coming from: SNF  Chief Complaint:  Chief Complaint  Patient presents with   Pneumonia   HPI: Jon Smith is a 89 y.o. male with medical history significant of CKD stage IIIb, hyperlipidemia, hypertension, peripheral artery disease, who lives in a nursing home brought in with concerns of worsening pneumonia as seen on an x-ray obtained in the facility in the setting of persistent cough.  Patient was recently admitted here last month and managed for dehydration with AKI.  He is unable to provide subjective information on account of dementia.  History obtained from chart review as well as discussing with patient's son over the phone.  He denied nausea vomiting chest pain diarrhea or urinary complaints.  Upon arrival to the emergency room patient had temperature 97.7, blood pressure 115/49 respiratory rate 25 pulse 81.  CT scan of the brain did not show acute intracranial pathology, chest x-ray showed findings of bibasilar infiltrate.  Hospitalist service was contacted to admit patient for further management.  Review of Systems: As mentioned in the history of present illness. All other systems reviewed and are negative. Past Medical History:  Diagnosis Date   Back pain    Elevated LFTs    Hepatitis    HLD (hyperlipidemia)    Hypertension    Incomplete bladder emptying    Lower extremity ulceration (HCC)    Nocturia    Peripheral arterial disease (HCC)    Prostate cancer (HCC) remote   Cryotherapy by Dr. Orson Slick.  PSA 5.54 in September at [facility]   Renal stones    Past Surgical History:  Procedure Laterality Date   AORTOGRAM     PERITONEAL CATHETER INSERTION     PROSTATE SURGERY     TRANSLUMINAL ANGIOPLASTY     Social History:  reports that he has quit smoking. His smoking use included cigarettes. He  has never used smokeless tobacco. He reports that he does not drink alcohol and does not use drugs.  Allergies  Allergen Reactions   Aspirin Nausea And Vomiting   Sulfamethoxazole-Trimethoprim     Other reaction(s): Blood Disorder Coagulopathy    Family History  Problem Relation Age of Onset   Heart disease Brother    Kidney Stones Sister    Heart attack Sister    Heart attack Brother    Kidney disease Neg Hx    Prostate cancer Neg Hx     Prior to Admission medications   Medication Sig Start Date End Date Taking? Authorizing Provider  acetaminophen (TYLENOL) 325 MG tablet Take 650 mg by mouth every 6 (six) hours as needed for mild pain.    [provider]  alum & mag hydroxide-simeth (ANTACID LIQUID) 200-200-20 MG/5ML suspension Take by mouth 4 (four) times daily as needed for indigestion or heartburn.    [provider]  ASPIRIN LOW DOSE 81 MG EC tablet Take 81 mg by mouth daily. 03/26/21   [provider]  atorvastatin (LIPITOR) 40 MG tablet Take 40 mg by mouth daily. 10/12/20   [provider]  clopidogrel (PLAVIX) 75 MG tablet Take by mouth. 08/02/20   [provider]  ferrous sulfate 325 (65 FE) MG EC tablet Take 1 tablet by mouth in the morning and at bedtime. 02/26/21   [provider]  finasteride (PROSCAR) 5 MG tablet Take 1 tablet (5 mg total)  by mouth daily. 04/29/23   Vanna Scotland, MD  guaifenesin (ROBITUSSIN) 100 MG/5ML syrup Take 300 mg by mouth 4 (four) times daily as needed for cough.    [provider]  latanoprost (XALATAN) 0.005 % ophthalmic solution Place 1 drop into both eyes at bedtime. 09/23/15   [provider]  lisinopril (ZESTRIL) 40 MG tablet Take 40 mg by mouth daily. 04/01/22   [provider]  loperamide (IMODIUM A-D) 2 MG tablet Take 2 mg by mouth 4 (four) times daily as needed for diarrhea or loose stools.    [provider]  magnesium hydroxide (MILK OF MAGNESIA) 400  MG/5ML suspension Take 30 mLs by mouth daily as needed for mild constipation.    [provider]  metoprolol tartrate (LOPRESSOR) 25 MG tablet Take 0.5 tablets (12.5 mg total) by mouth 2 (two) times daily. 12/12/23   Lorin Glass, MD  sertraline (ZOLOFT) 50 MG tablet Take 50 mg by mouth daily. 11/07/23   [provider]  sodium bicarbonate 650 MG tablet Take 1 tablet (650 mg total) by mouth 3 (three) times daily. 12/12/23   Lorin Glass, MD  tamsulosin (FLOMAX) 0.4 MG CAPS capsule Take 1 capsule (0.4 mg total) by mouth daily. At bedtime 04/29/23   Vanna Scotland, MD    Physical Exam: Vitals:   01/30/24 1217 01/30/24 1505 01/30/24 1530 01/30/24 1556  BP: (!) 115/49 101/70 (!) 109/49   Pulse: 81 82 79 79  Resp: (!) 25 18 (!) 30 (!) 25  Temp: 97.7 F (36.5 C) 98 F (36.7 C)    TempSrc: Oral Oral    SpO2: 94% 100% 100% 100%  Weight: 61.7 kg      General: Elderly male laying in bed in no acute distress HEENT: Atraumatic normocephalic Lungs: Decreased air entry bilaterally Abdomen: Soft nontender CNS: Awake very hard at hearing having generalized extremity weakness  Data Reviewed:    Latest Ref Rng & Units 01/30/2024   12:23 PM 12/11/2023    5:25 AM 12/07/2023    6:22 AM  CBC  WBC 4.0 - 10.5 K/uL 19.4  10.1  9.2   Hemoglobin 13.0 - 17.0 g/dL 8.3  8.9  8.4   Hematocrit 39.0 - 52.0 % 25.8  28.0  26.6   Platelets 150 - 400 K/uL 512  267  290        Latest Ref Rng & Units 01/30/2024   12:23 PM 12/11/2023    5:25 AM 12/08/2023    4:55 AM  CMP  Glucose 70 - 99 mg/dL 86  89  99   BUN 8 - 23 mg/dL 161  28  35   Creatinine 0.61 - 1.24 mg/dL 0.96  0.45  4.09   Sodium 135 - 145 mmol/L 149  133  135   Potassium 3.5 - 5.1 mmol/L 5.0  4.4  4.7   Chloride 98 - 111 mmol/L 115  105  111   CO2 22 - 32 mmol/L 16  18  17    Calcium 8.9 - 10.3 mg/dL 8.5  8.7  8.3   Total Protein 6.5 - 8.1 g/dL 7.0     Total Bilirubin 0.0 - 1.2 mg/dL 0.9     Alkaline Phos 38 - 126 U/L 73      AST 15 - 41 U/L 43     ALT 0 - 44 U/L 26        Assessment and Plan:   Sepsis secondary to bilateral pneumonia Patient presented with WBC 19,  respiratory rate 25 in the setting of imaging showing bilateral pneumonia Follow-up on respiratory panel Given recent hospitalization will cover with Vanco and cefepime We will de-escalate antibiotics based on culture results Strep and Legionella test Obtain lactic acid and procalcitonin  AKI on CKD 3B due to dehydration Acute on chronic metabolic acidosis Patient presenting with bicarb of 16 and creatinine elevated from baseline up to 5 Clinically dehydrated Continue IV fluid resuscitation Monitor renal function If renal function does not improve with IV hydration to consult nephrologist Renally dose all drugs   Acute on chronic anemia Chronic iron deficiency Folic acid deficiency Continue vitamin B12, folate acid as well as iron supplementation Monitor CBC closely No indication for transfusion at this time  Mild hyponatremia secondary to dehydration Continue IV fluid therapy Monitor sodium levels  Dementia Placed on delirium precaution  Essential hypertension We will hold off antihypertensives at this time given relative hypotension  Generalized weakness PT OT consulted   BPH Continue tamsulosin   Hyperlipidemia Continue statin therapy   Prostate CA status post treatment In remission   Dementia Continue Zoloft   Lung nodule CT scan C-spine showed 2.5 mm posterior left apical noncalcified lung nodule, stable in size and appearance when compared to recent CT chest in April 2024 Outpatient follow-up     DVT prophylaxis: Heparin  Disposition: Back to skilled nursing facility when medically stable   Advance Care Planning:   Code Status: Prior DNI DNR  Consults: We will consult nephrology if renal function does not improve with hydration  Family Communication: Patient's son  Severity of Illness: The  appropriate patient status for this patient is INPATIENT. Inpatient status is judged to be reasonable and necessary in order to provide the required intensity of service to ensure the patient's safety. The patient's presenting symptoms, physical exam findings, and initial radiographic and laboratory data in the context of their chronic comorbidities is felt to place them at high risk for further clinical deterioration. Furthermore, it is not anticipated that the patient will be medically stable for discharge from the hospital within 2 midnights of admission.   * I certify that at the point of admission it is my clinical judgment that the patient will require inpatient hospital care spanning beyond 2 midnights from the point of admission due to high intensity of service, high risk for further deterioration and high frequency of surveillance required.*  Author: Loyce Dys, MD 01/30/2024 4:04 PM  For on call review [email or web address].

## 2024-01-30 NOTE — ED Provider Triage Note (Signed)
Emergency Medicine Provider Triage Evaluation Note  Jon Smith , a 89 y.o. male  was evaluated in triage.  Pt complains of from [facility] with hx of pneumonia that looks slightly worse on xray.  Brought to ER via EMS.  Patient finished 6 day course of Levaquin 500mg   01/25/24.  Also history of UTI  Review of Systems  Positive: + pnemonia, UTI Negative: No vomiting or diarrhea.   Physical Exam  There were no vitals taken for this visit. Gen:   Awake, no distress   Resp:  Normal effort   Poor air exchange but no wheezing noted.  MSK:   Extremities contacted Other:    Medical Decision Making  Medically screening exam initiated at 12:15 PM.  Appropriate orders placed.  Jon Smith was informed that the remainder of the evaluation will be completed by another provider, this initial triage assessment does not replace that evaluation, and the importance of remaining in the ED until their evaluation is complete.     Tommi Rumps, PA-C 01/30/24 1223

## 2024-01-30 NOTE — Sepsis Progress Note (Signed)
 Code Sepsis protocol being monitored by [employer].

## 2024-01-30 NOTE — ED Triage Notes (Signed)
Pt presents to the ED via [facility] from [facility]. Pt was treated for PNA with levaquin from 1/20-1/26. Pt had a repeat chest xray that was read today that showed mildly worsening PNA.

## 2024-01-31 ENCOUNTER — Encounter: Payer: Self-pay | Admitting: Internal Medicine

## 2024-01-31 ENCOUNTER — Other Ambulatory Visit: Payer: Self-pay

## 2024-01-31 DIAGNOSIS — J189 Pneumonia, unspecified organism: Secondary | ICD-10-CM

## 2024-01-31 DIAGNOSIS — N179 Acute kidney failure, unspecified: Secondary | ICD-10-CM | POA: Diagnosis not present

## 2024-01-31 DIAGNOSIS — N189 Chronic kidney disease, unspecified: Secondary | ICD-10-CM

## 2024-01-31 LAB — BASIC METABOLIC PANEL
Anion gap: 15 (ref 5–15)
BUN: 106 mg/dL — ABNORMAL HIGH (ref 8–23)
CO2: 13 mmol/L — ABNORMAL LOW (ref 22–32)
Calcium: 8.2 mg/dL — ABNORMAL LOW (ref 8.9–10.3)
Chloride: 122 mmol/L — ABNORMAL HIGH (ref 98–111)
Creatinine, Ser: 4.37 mg/dL — ABNORMAL HIGH (ref 0.61–1.24)
GFR, Estimated: 12 mL/min — ABNORMAL LOW (ref >60)
Glucose, Bld: 78 mg/dL (ref 70–99)
Potassium: 4.7 mmol/L (ref 3.5–5.1)
Sodium: 150 mmol/L — ABNORMAL HIGH (ref 135–145)

## 2024-01-31 LAB — CBC WITH DIFFERENTIAL/PLATELET
Abs Immature Granulocytes: 0.09 10*3/uL — ABNORMAL HIGH (ref 0.00–0.07)
Basophils Absolute: 0.1 10*3/uL (ref 0.0–0.1)
Basophils Relative: 0 %
Eosinophils Absolute: 0.1 10*3/uL (ref 0.0–0.5)
Eosinophils Relative: 0 %
HCT: 23.5 % — ABNORMAL LOW (ref 39.0–52.0)
Hemoglobin: 7.4 g/dL — ABNORMAL LOW (ref 13.0–17.0)
Immature Granulocytes: 1 %
Lymphocytes Relative: 5 %
Lymphs Abs: 0.7 10*3/uL (ref 0.7–4.0)
MCH: 27.6 pg (ref 26.0–34.0)
MCHC: 31.5 g/dL (ref 30.0–36.0)
MCV: 87.7 fL (ref 80.0–100.0)
Monocytes Absolute: 1.2 10*3/uL — ABNORMAL HIGH (ref 0.1–1.0)
Monocytes Relative: 9 %
Neutro Abs: 12 10*3/uL — ABNORMAL HIGH (ref 1.7–7.7)
Neutrophils Relative %: 85 %
Platelets: 436 10*3/uL — ABNORMAL HIGH (ref 150–400)
RBC: 2.68 MIL/uL — ABNORMAL LOW (ref 4.22–5.81)
RDW: 17.9 % — ABNORMAL HIGH (ref 11.5–15.5)
WBC: 14.1 10*3/uL — ABNORMAL HIGH (ref 4.0–10.5)
nRBC: 0.1 % (ref 0.0–0.2)

## 2024-01-31 LAB — MRSA NEXT GEN BY PCR, NASAL: MRSA by PCR Next Gen: DETECTED — AB

## 2024-01-31 LAB — PROCALCITONIN: Procalcitonin: 36.05 ng/mL

## 2024-01-31 LAB — VANCOMYCIN, RANDOM: Vancomycin Rm: 10 ug/mL

## 2024-01-31 MED ORDER — ENSURE ENLIVE PO LIQD
237.0000 mL | Freq: Two times a day (BID) | ORAL | Status: DC
  Administered 2024-01-31: 237 mL via ORAL

## 2024-01-31 MED ORDER — VANCOMYCIN HCL IN DEXTROSE 1-5 GM/200ML-% IV SOLN
1000.0000 mg | Freq: Once | INTRAVENOUS | Status: AC
  Administered 2024-01-31: 1000 mg via INTRAVENOUS
  Filled 2024-01-31: qty 200

## 2024-01-31 MED ORDER — SODIUM CHLORIDE 0.45 % IV SOLN: INTRAVENOUS | Status: AC

## 2024-01-31 MED ORDER — SODIUM CHLORIDE 0.9 % IV SOLN
1.0000 g | INTRAVENOUS | Status: DC
  Administered 2024-01-31 – 2024-02-03 (×4): 1 g via INTRAVENOUS
  Filled 2024-01-31 (×6): qty 10

## 2024-01-31 NOTE — Evaluation (Signed)
Physical Therapy Evaluation Patient Details Name: Jon Smith MRN: [id number] DOB: 11-16-1928 Today's Date: 01/31/2024  History of Present Illness  Pt admitted to [facility] on 01/30/24 for c/o worsening PNA. Recent hospital admission on 1/20-1/26 for PNA and UTI. Dx revealed sepsis secondary to bilateral PNA and acute on chronic metabolic acidosis. Significant PMH includes: dementia, CKD, HTN, HLD, PrCA, anemia, PAD, back pain   Clinical Impression  Pt is a 89 year old M admitted to hospital on 01/30/24 for bilateral PNA. Pt unable to provide PLOF secondary to AMS and baseline dementia. Per chart review, pt is from facility, requires assist for ADL's, and is supervision for lateral scoot transfers to/from [facility].   Pt presents with bilateral knee flexion contractures, impaired skin integrity, impaired processing/AMS, generalized weakness, decreased gross balance, impaired safety awareness, and decreased activity tolerance, resulting in impaired functional mobility from baseline. Due to deficits, pt required max assist for supine<>sit and lateral scooting towards HOB. Difficulty initiating lateral scooting secondary to fatigue, generalized weakness, and AMS.  Deficits limit the pt's ability to safely and independently perform ADL's, transfer, and ambulate. Pt will benefit from acute skilled PT services to address deficits for return to baseline function. Recommending post acute therapy services to address deficits and improve overall safety with functional mobility.         If plan is discharge home, recommend the following: Two people to help with walking and/or transfers;A lot of help with bathing/dressing/bathroom;Assistance with cooking/housework;Help with stairs or ramp for entrance;Assist for transportation;Direct supervision/assist for financial management;Direct supervision/assist for medications management   Can travel by private vehicle   No    Equipment Recommendations None recommended by PT      Functional Status Assessment Patient has had a recent decline in their functional status and demonstrates the ability to make significant improvements in function in a reasonable and predictable amount of time.     Precautions / Restrictions Precautions Precautions: Fall Restrictions Other Position/Activity Restrictions: DNR/Limited      Mobility  Bed Mobility Overal bed mobility: Needs Assistance Bed Mobility: Sit to Supine, Supine to Sit     Supine to sit: Max assist Sit to supine: Max assist   General bed mobility comments: max assist for BLE and trunk facilitation for supine<>sit transfers with HOB slightly elevated. Max multimodal cues for safety, sequencing, and hand placement.    Transfers                   General transfer comment: attempted lateral scooting at bedside to mimic lateral scoot transfers (at baseline). Max assist to perform lateral scoot to the R secondary to impaired cognition, poor balance, and generalized weakness. Max multimodal cues for safety, sequencing, and hand placement.    Ambulation/Gait               General Gait Details: non-ambulatory at baseline      Balance Overall balance assessment: Needs assistance   Sitting balance-Leahy Scale: Poor Sitting balance - Comments: initially max assist for static seated balance, progressing to min assist for safety with proper UE support. Mod assist for dynamic seated balance.       Standing balance comment: NT as pt is non-ambulatory at baseline                             Pertinent Vitals/Pain Pain Assessment Pain Assessment: Faces Faces Pain Scale: No hurt    Home Living Family/patient expects to be discharged  to:: Skilled nursing facility                   Additional Comments: pt performs lateral scoots to/from [facility] w/ supervision at baseline. Does not use RW    Prior Function Prior Level of Function : Needs assist;History of Falls (last six months)   Cognitive Assist : ADLs (cognitive)   ADLs (Cognitive): Intermittent cues Physical Assist : Mobility (physical);ADLs (physical) Mobility (physical): Transfers ADLs (physical): Bathing;Dressing;Toileting;IADLs Mobility Comments: supervision for transfers at facility ADLs Comments: occasion assist with dressing, toileting and bathing. Pt states he does most adls on his own with very little assist.     Extremity/Trunk Assessment   Upper Extremity Assessment Upper Extremity Assessment: Generalized weakness    Lower Extremity Assessment Lower Extremity Assessment: Generalized weakness (bilateral knee contractures)       Communication   Communication Communication: Hearing impairment Cueing Techniques: Verbal cues;Tactile cues;Visual cues  Cognition Arousal: Lethargic Behavior During Therapy: Flat affect Overall Cognitive Status: History of cognitive impairments - at baseline                                 General Comments: difficulty following one step commands, improved attention with hand over hand cues.        General Comments General comments (skin integrity, edema, etc.): healing scabs to BLE    Exercises Other Exercises Other Exercises: Participates in bed mobility and seated scooting at bedside. Educated re: PT role/POC and benefits of participation with mobility.   Assessment/Plan    PT Assessment Patient needs continued PT services  PT Problem List Decreased strength;Decreased range of motion;Decreased activity tolerance;Decreased balance;Decreased mobility;Decreased cognition;Decreased safety awareness;Decreased skin integrity       PT Treatment Interventions DME instruction;Gait training;Stair training;Functional mobility training;Therapeutic activities;Therapeutic exercise;Balance training;Neuromuscular re-education;Patient/family education    PT Goals (Current goals can be found in the Care Plan section)  Acute Rehab PT Goals PT Goal  Formulation: Patient unable to participate in goal setting    Frequency Min 1X/week        AM-PAC PT "6 Clicks" Mobility  Outcome Measure Help needed turning from your back to your side while in a flat bed without using bedrails?: A Lot Help needed moving from lying on your back to sitting on the side of a flat bed without using bedrails?: A Lot Help needed moving to and from a bed to a chair (including a wheelchair)?: Total Help needed standing up from a chair using your arms (e.g., wheelchair or bedside chair)?: Total Help needed to walk in hospital room?: Total Help needed climbing 3-5 steps with a railing? : Total 6 Click Score: 8    End of Session Equipment Utilized During Treatment: Gait belt Activity Tolerance: Patient limited by lethargy Patient left: in bed;with call bell/phone within reach;with bed alarm set Nurse Communication: Mobility status PT Visit Diagnosis: Muscle weakness (generalized) (M62.81)    Time: 1610-9604 PT Time Calculation (min) (ACUTE ONLY): 11 min   Charges:   PT Evaluation $PT Eval Low Complexity: 1 Low   PT General Charges $$ ACUTE PT VISIT: 1 Visit        Vira Blanco, PT, DPT 1:35 PM,01/31/24 Physical Therapist - [place] [facility]

## 2024-01-31 NOTE — Progress Notes (Signed)
[facility] CONSULT NOTE    Date: 01/31/2024                  Patient Name:  Jon Smith  MRN: [id number]  DOB: 01-04-1928  Age / Sex: 89 y.o., male         PCP: [name], NP                 Service Requesting Consult: Medicine                  Reason for Consult: Acute kidney injury            History of Present Illness: Patient is a 89 y.o. male with a PMHx of hypertension, hyperlipidemia, peripheral vascular disease, chronic kidney disease who is a long-term nursing home patient now comes in with persistent cough.  He is being treated for pneumonia in the emergency room.  He is now found to have acute kidney injury.  He has been on lisinopril at 40 mg daily.  He has a baseline creatinine of about 1.7.  He had a renal sonogram last month which did not show any obstruction.  Medications: Outpatient medications: Medications Prior to Admission  Medication Sig Dispense Refill Last Dose/Taking   acetaminophen (TYLENOL) 325 MG tablet Take 650 mg by mouth every 6 (six) hours as needed for mild pain.   01/28/2024   alum & mag hydroxide-simeth (ANTACID LIQUID) 200-200-20 MG/5ML suspension Take by mouth 4 (four) times daily as needed for indigestion or heartburn.   Taking As Needed   ASPIRIN LOW DOSE 81 MG EC tablet Take 81 mg by mouth daily.   01/29/2024 at 11:01 AM   atorvastatin (LIPITOR) 40 MG tablet Take 40 mg by mouth daily.   01/29/2024 at  6:50 PM   clopidogrel (PLAVIX) 75 MG tablet Take by mouth.   01/29/2024 at 11:01 AM   cyanocobalamin 1000 MCG tablet Take 1,000 mcg by mouth daily.   01/29/2024 at 11:01 AM   ferrous sulfate 325 (65 FE) MG EC tablet Take 1 tablet by mouth in the morning and at bedtime.   01/29/2024 at 10:34 PM   finasteride (PROSCAR) 5 MG tablet Take 1 tablet (5 mg total) by mouth daily. 90 tablet 3 01/29/2024 at 11:01 AM   folic acid (FOLVITE) 1 MG tablet Take 1 mg by mouth daily.   01/29/2024 at 11:01 AM   latanoprost (XALATAN) 0.005 %  ophthalmic solution Place 1 drop into both eyes at bedtime.   01/29/2024 at  7:16 PM   loperamide (IMODIUM A-D) 2 MG tablet Take 2 mg by mouth 4 (four) times daily as needed for diarrhea or loose stools.   Taking As Needed   magnesium hydroxide (MILK OF MAGNESIA) 400 MG/5ML suspension Take 30 mLs by mouth daily as needed for mild constipation.   Taking As Needed   melatonin 3 MG TABS tablet Take 3 mg by mouth at bedtime.   01/28/2024   metoprolol succinate (TOPROL-XL) 25 MG 24 hr tablet Take 25 mg by mouth daily.   01/29/2024 at 11:01 AM   sertraline (ZOLOFT) 50 MG tablet Take 50 mg by mouth daily.   01/28/2024   sodium bicarbonate 650 MG tablet Take 1 tablet (650 mg total) by mouth 3 (three) times daily.   Taking   tamsulosin (FLOMAX) 0.4 MG CAPS capsule Take 1 capsule (0.4 mg total) by mouth daily. At bedtime 90 capsule 3 01/28/2024   guaifenesin (ROBITUSSIN) 100 MG/5ML  syrup Take 300 mg by mouth 4 (four) times daily as needed for cough. (Patient not taking: Reported on 01/30/2024)   Not Taking   lisinopril (ZESTRIL) 40 MG tablet Take 40 mg by mouth daily. (Patient not taking: Reported on 01/30/2024)   Not Taking   metoprolol tartrate (LOPRESSOR) 25 MG tablet Take 0.5 tablets (12.5 mg total) by mouth 2 (two) times daily. (Patient not taking: Reported on 01/30/2024)   Not Taking    Discontinued Meds:  There are no discontinued medications.  Current medications: Current Facility-Administered Medications  Medication Dose Route Frequency Provider Last Rate Last Admin   atorvastatin (LIPITOR) tablet 40 mg  40 mg Oral Daily Rosezetta Schlatter T, MD   40 mg at 01/31/24 0054   ceFEPIme (MAXIPIME) 1 g in sodium chloride 0.9 % 100 mL IVPB  1 g Intravenous Q24H Tressie Ellis, RPH       clopidogrel (PLAVIX) tablet 75 mg  75 mg Oral Daily Rosezetta Schlatter T, MD   75 mg at 01/31/24 1009   cyanocobalamin (VITAMIN B12) tablet 1,000 mcg  1,000 mcg Oral Daily Rosezetta Schlatter T, MD   1,000 mcg at 01/31/24 1009   ferrous sulfate  tablet 325 mg  325 mg Oral Q breakfast Rosezetta Schlatter T, MD   325 mg at 01/31/24 1009   finasteride (PROSCAR) tablet 5 mg  5 mg Oral Daily Rosezetta Schlatter T, MD   5 mg at 01/31/24 1009   folic acid (FOLVITE) tablet 1 mg  1 mg Oral Daily Rosezetta Schlatter T, MD   1 mg at 01/31/24 1009   heparin injection 5,000 Units  5,000 Units Subcutaneous Q8H Rosezetta Schlatter T, MD   5,000 Units at 01/31/24 1610   sertraline (ZOLOFT) tablet 50 mg  50 mg Oral Daily Rosezetta Schlatter T, MD   50 mg at 01/31/24 0054   tamsulosin (FLOMAX) capsule 0.4 mg  0.4 mg Oral Daily Rosezetta Schlatter T, MD   0.4 mg at 01/31/24 1009   vancomycin variable dose per unstable renal function (pharmacist dosing)   Does not apply See admin instructions Orson Aloe, [facility]          Allergies: Allergies  Allergen Reactions   Aspirin Nausea And Vomiting   Sulfamethoxazole-Trimethoprim     Other reaction(s): Blood Disorder Coagulopathy      Past Medical History: Past Medical History:  Diagnosis Date   Back pain    Elevated LFTs    Hepatitis    HLD (hyperlipidemia)    Hypertension    Incomplete bladder emptying    Lower extremity ulceration (HCC)    Nocturia    Peripheral arterial disease (HCC)    Prostate cancer (HCC) remote   Cryotherapy by Dr. Orson Slick.  PSA 5.54 in September at [facility]   Renal stones      Past Surgical History: Past Surgical History:  Procedure Laterality Date   AORTOGRAM     PERITONEAL CATHETER INSERTION     PROSTATE SURGERY     TRANSLUMINAL ANGIOPLASTY       Family History: Family History  Problem Relation Age of Onset   Heart disease Brother    Kidney Stones Sister    Heart attack Sister    Heart attack Brother    Kidney disease Neg Hx    Prostate cancer Neg Hx      Social History: Social History   Socioeconomic History   Marital status: Widowed    Spouse name: Not on file   Number of  children: Not on file   Years of education: Not on file   Highest education level: Not on file  Occupational  History   Not on file  Tobacco Use   Smoking status: Former    Types: Cigarettes   Smokeless tobacco: Never   Tobacco comments:    quit 10 days  Substance and Sexual Activity   Alcohol use: No    Alcohol/week: 0.0 standard drinks of alcohol   Drug use: No   Sexual activity: Not Currently  Other Topics Concern   Not on file  Social History Narrative   Not on file   Social Drivers of Health   Financial Resource Strain: High Risk (07/25/2020)   [facility] from [facility], [facility]   Overall Financial Resource Strain (CARDIA)    Difficulty of Paying Living Expenses: Hard  Food Insecurity: Patient Unable To Answer (11/25/2023)   Hunger Vital Sign    Worried About Running Out of Food in the Last Year: Patient unable to answer    Ran Out of Food in the Last Year: Patient unable to answer  Transportation Needs: No Transportation Needs (11/25/2023)   PRAPARE - Administrator, Civil Service (Medical): No    Lack of Transportation (Non-Medical): No  Physical Activity: Not on file  Stress: Not on file  Social Connections: Not on file  Intimate Partner Violence: Not At Risk (04/11/2023)   Humiliation, Afraid, Rape, and Kick questionnaire    Fear of Current or Ex-Partner: No    Emotionally Abused: No    Physically Abused: No    Sexually Abused: No     Review of Systems: As per HPI  Vital Signs: Blood pressure 131/66, pulse 89, temperature 98.5 F (36.9 C), temperature source Axillary, resp. rate 18, weight 61.7 kg, SpO2 98%.  Weight trends: Filed Weights   01/30/24 1217  Weight: 61.7 kg    Physical Exam: Physical Exam: General:  No acute distress  Head:  Normocephalic, atraumatic. Moist oral mucosal membranes  Eyes:  Anicteric  Neck:  Supple  Lungs:   Clear to auscultation, normal effort  Heart:  S1S2 no rubs  Abdomen:   Soft, nontender, bowel sounds present  Extremities:  peripheral edema.  Neurologic:  Awake, alert, following commands  Skin:   No lesions  Access:     Lab results:  Basic Metabolic Panel: Recent Labs  Lab 01/30/24 1223 01/31/24 0458  NA 149* 150*  K 5.0 4.7  CL 115* 122*  CO2 16* 13*  GLUCOSE 86 78  BUN 110* 106*  CREATININE 5.16* 4.37*  CALCIUM 8.5* 8.2*    Creatinine  Date/Time Value Ref Range Status  10/15/2012 12:32 PM 1.87 (H) 0.60 - 1.30 mg/dL Final   Creatinine, Ser  Date/Time Value Ref Range Status  01/31/2024 04:58 AM 4.37 (H) 0.61 - 1.24 mg/dL Final  16/09/9603 54:09 PM 5.16 (H) 0.61 - 1.24 mg/dL Final  81/19/1478 29:56 AM 1.64 (H) 0.61 - 1.24 mg/dL Final  21/30/8657 84:69 AM 1.73 (H) 0.61 - 1.24 mg/dL Final  62/95/2841 32:44 AM 1.81 (H) 0.61 - 1.24 mg/dL Final  01/01/7252 66:44 PM 1.83 (H) 0.61 - 1.24 mg/dL Final  03/47/4259 56:38 AM 2.13 (H) 0.61 - 1.24 mg/dL Final  75/64/3329 51:88 AM 1.84 (H) 0.61 - 1.24 mg/dL Final  41/66/0630 16:01 PM 2.45 (H) 0.61 - 1.24 mg/dL Final  09/32/3557 32:20 PM 2.51 (H) 0.61 - 1.24 mg/dL Final  25/42/7062 37:62 AM 2.30 (H) 0.61 - 1.24 mg/dL Final  11/29/2023 04:05 AM 1.79 (H) 0.61 - 1.24 mg/dL Final  16/09/9603 54:09 AM 1.94 (H) 0.61 - 1.24 mg/dL Final  81/19/1478 29:56 AM 1.58 (H) 0.61 - 1.24 mg/dL Final  21/30/8657 84:69 AM 1.73 (H) 0.61 - 1.24 mg/dL Final  62/95/2841 32:44 AM 1.71 (H) 0.61 - 1.24 mg/dL Final  01/01/7252 66:44 AM 1.68 (H) 0.61 - 1.24 mg/dL Final  03/47/4259 56:38 AM 1.95 (H) 0.61 - 1.24 mg/dL Final  75/64/3329 51:88 AM 1.84 (H) 0.61 - 1.24 mg/dL Final  41/66/0630 16:01 AM 1.92 (H) 0.61 - 1.24 mg/dL Final  09/32/3557 32:20 AM 1.52 (H) 0.61 - 1.24 mg/dL Final  25/42/7062 37:62 PM 1.79 (H) 0.61 - 1.24 mg/dL Final  83/15/1761 60:73 PM 1.68 (H) 0.61 - 1.24 mg/dL Final  71/05/2693 85:46 AM 1.09 0.61 - 1.24 mg/dL Final  27/02/5008 38:18 AM 1.06 0.61 - 1.24 mg/dL Final  29/93/7169 67:89 AM 1.28 (H) 0.61 - 1.24 mg/dL Final  38/09/1750 02:58 AM 1.18 0.61 - 1.24 mg/dL Final  52/77/8242 35:36 AM 1.28 (H) 0.61 - 1.24 mg/dL Final   14/43/1540 08:67 AM 1.24 0.61 - 1.24 mg/dL Final  61/95/0932 67:12 AM 1.40 (H) 0.61 - 1.24 mg/dL Final  45/80/9983 38:25 PM 1.60 (H) 0.61 - 1.24 mg/dL Final    CBC: Recent Labs  Lab 01/30/24 1223 01/31/24 0458  WBC 19.4* 14.1*  NEUTROABS 17.2* 12.0*  HGB 8.3* 7.4*  HCT 25.8* 23.5*  MCV 86.6 87.7  PLT 512* 436*    Microbiology: Results for orders placed or performed during the hospital encounter of 01/30/24  Blood culture (routine x 2)     Status: None (Preliminary result)   Collection Time: 01/30/24 12:23 PM   Specimen: BLOOD  Result Value Ref Range Status   Specimen Description BLOOD LEFT ANTECUBITAL  Final   Special Requests   Final    BOTTLES DRAWN AEROBIC AND ANAEROBIC Blood Culture adequate volume   Culture   Final    NO GROWTH < 24 HOURS Performed at [facility], [address] [place], [place] [address]    Report Status PENDING  Incomplete  Blood culture (routine x 2)     Status: None (Preliminary result)   Collection Time: 01/30/24  3:19 PM   Specimen: BLOOD  Result Value Ref Range Status   Specimen Description BLOOD BLOOD RIGHT ARM  Final   Special Requests   Final    BOTTLES DRAWN AEROBIC AND ANAEROBIC Blood Culture results may not be optimal due to an inadequate volume of blood received in culture bottles   Culture   Final    NO GROWTH < 24 HOURS Performed at [facility], [address] [place], [place] [address]    Report Status PENDING  Incomplete  Resp panel by RT-PCR (RSV, Flu A&B, Covid) Anterior Nasal Swab     Status: None   Collection Time: 01/30/24  3:19 PM   Specimen: Anterior Nasal Swab  Result Value Ref Range Status   SARS Coronavirus 2 by RT PCR NEGATIVE NEGATIVE Final    Comment: (NOTE) SARS-CoV-2 target nucleic acids are NOT DETECTED.  The SARS-CoV-2 RNA is generally detectable in upper respiratory specimens during the acute phase of infection. The lowest concentration of SARS-CoV-2 viral copies this assay can  detect is 138 copies/mL. A negative result does not preclude SARS-Cov-2 infection and should not be used as the sole basis for treatment or other patient management decisions. A negative result may occur with  improper specimen collection/handling, submission of specimen  other than nasopharyngeal swab, presence of viral mutation(s) within the areas targeted by this assay, and inadequate number of viral copies(<138 copies/mL). A negative result must be combined with clinical observations, patient history, and epidemiological information. The expected result is Negative.  Fact Sheet for Patients:  BloggerCourse.com  Fact Sheet for Healthcare Providers:  SeriousBroker.it  This test is no t yet approved or cleared by the [place] FDA and  has been authorized for detection and/or diagnosis of SARS-CoV-2 by FDA under an Emergency Use Authorization (EUA). This EUA will remain  in effect (meaning this test can be used) for the duration of the COVID-19 declaration under Section 564(b)(1) of the Act, 21 U.S.C.section 360bbb-3(b)(1), unless the authorization is terminated  or revoked sooner.       Influenza A by PCR NEGATIVE NEGATIVE Final   Influenza B by PCR NEGATIVE NEGATIVE Final    Comment: (NOTE) The Xpert Xpress SARS-CoV-2/FLU/RSV plus assay is intended as an aid in the diagnosis of influenza from Nasopharyngeal swab specimens and should not be used as a sole basis for treatment. Nasal washings and aspirates are unacceptable for Xpert Xpress SARS-CoV-2/FLU/RSV testing.  Fact Sheet for Patients: BloggerCourse.com  Fact Sheet for Healthcare Providers: SeriousBroker.it  This test is not yet approved or cleared by the [place] FDA and has been authorized for detection and/or diagnosis of SARS-CoV-2 by FDA under an Emergency Use Authorization (EUA). This EUA will remain in  effect (meaning this test can be used) for the duration of the COVID-19 declaration under Section 564(b)(1) of the Act, 21 U.S.C. section 360bbb-3(b)(1), unless the authorization is terminated or revoked.     Resp Syncytial Virus by PCR NEGATIVE NEGATIVE Final    Comment: (NOTE) Fact Sheet for Patients: BloggerCourse.com  Fact Sheet for Healthcare Providers: SeriousBroker.it  This test is not yet approved or cleared by the [place] FDA and has been authorized for detection and/or diagnosis of SARS-CoV-2 by FDA under an Emergency Use Authorization (EUA). This EUA will remain in effect (meaning this test can be used) for the duration of the COVID-19 declaration under Section 564(b)(1) of the Act, 21 U.S.C. section 360bbb-3(b)(1), unless the authorization is terminated or revoked.  Performed at [facility], [address] [place], [place] [address]   MRSA Next Gen by PCR, Nasal     Status: Abnormal   Collection Time: 01/31/24  4:58 AM   Specimen: Nasal Mucosa; Nasal Swab  Result Value Ref Range Status   MRSA by PCR Next Gen DETECTED (A) NOT DETECTED Final    Comment: RESULT CALLED TO, READ BACK BY AND VERIFIED WITH: JENNIFER WHITLEY AT 6045 01/31/24.PMF (NOTE) The GeneXpert MRSA Assay (FDA approved for NASAL specimens only), is one component of a comprehensive MRSA colonization surveillance program. It is not intended to diagnose MRSA infection nor to guide or monitor treatment for MRSA infections. Test performance is not FDA approved in patients less than 8 years old. Performed at [facility], [address] [place], [place] [address]     Urinalysis: No results for input(s): "COLORURINE", "LABSPEC", "PHURINE", "GLUCOSEU", "HGBUR", "BILIRUBINUR", "KETONESUR", "PROTEINUR", "UROBILINOGEN", "NITRITE", "LEUKOCYTESUR" in the last 72 hours.  Invalid input(s): "APPERANCEUR"   Imaging:  CT Head Wo  Contrast Result Date: 01/30/2024 CLINICAL DATA:  Altered mental status EXAM: CT HEAD WITHOUT CONTRAST TECHNIQUE: Contiguous axial images were obtained from the base of the skull through the vertex without intravenous contrast. RADIATION DOSE REDUCTION: This exam was performed according to the departmental dose-optimization program  which includes automated exposure control, adjustment of the mA and/or kV according to patient size and/or use of iterative reconstruction technique. COMPARISON:  12/04/2023 FINDINGS: Brain: No evidence of acute infarction, hemorrhage, mass, mass effect, or midline shift. No hydrocephalus or extra-axial fluid collection. Moderate cerebral atrophy. Remote infarcts in bilateral basal ganglia, left cerebellum, and pons. Periventricular white matter changes, likely the sequela of chronic small vessel ischemic disease. Vascular: No hyperdense vessel. Atherosclerotic calcifications in the intracranial carotid and vertebral arteries. Skull: Negative for fracture or focal lesion. Sinuses/Orbits: No acute finding. Other: The mastoid air cells are well aerated. IMPRESSION: No acute intracranial process. Electronically Signed   By: Wiliam Ke M.D.   On: 01/30/2024 16:56   DG Chest 1 View Result Date: 01/30/2024 CLINICAL DATA:  Recently treated for pneumonia with worsening radiographic findings EXAM: CHEST  1 VIEW COMPARISON:  Chest radiograph dated 12/04/2023 FINDINGS: Patient is rotated to the right. Normal lung volumes. Patchy bibasilar opacities. No pleural effusion or pneumothorax. The heart size and mediastinal contours are within normal limits. No acute osseous abnormality. IMPRESSION: Patchy bibasilar opacities, which may represent atelectasis or reported residual pneumonia. Electronically Signed   By: Agustin Cree M.D.   On: 01/30/2024 13:27     Assessment & Plan:  89 y.o. male with a PMHx of hypertension, hyperlipidemia, peripheral vascular disease, chronic kidney disease who is a  long-term nursing home patient now comes in with persistent cough.  He is being treated for pneumonia in the emergency room.  He is now found to have acute kidney injury.  He has been on lisinopril at 40 mg daily.  He has a baseline creatinine of about 1.7.  He had a renal sonogram last month which did not show any obstruction.  Principal Problem:   Bilateral pneumonia  #1: Acute kidney injury on chronic kidney disease: Patient has CKD stage IIIb with a baseline creatinine of 1.7.  Patient now has acute kidney injury which is most likely secondary to prerenal azotemia due to decreased p.o. intake complicated by ATN secondary to sepsis.  He was hydrated with normal saline.  Will start IV fluids with half-normal saline.  #2: Metabolic acidosis: Metabolic acidosis can be secondary to sepsis complicated by acute kidney injury.  No need for IV bicarb at this time.  #3: Sepsis: Patient has been on vancomycin and cefepime.  Will monitor Vanco levels closely.  #4: Hyponatremia: Possibly secondary to decreased free water intake.  Will change IV fluids to half-normal saline.  Labs and medications reviewed. Will follow closely.  LOS: 1 Lorain Childes, MD [facility]. 2/1/20252:02 PM

## 2024-01-31 NOTE — ED Notes (Signed)
Hale Drone RN aware of assigned bed

## 2024-01-31 NOTE — Consult Note (Signed)
Pharmacy Antibiotic Note  ASSESSMENT: 89 y.o. male with PMH including CKD3b, HLD, HTN, PAD is presenting with pneumonia / HCAP. Patient is in AKI. CXR shows bilateral PNA. He has leukocytosis but is VSS and on RA, Pharmacy has been consulted to manage cefepime and vancomycin dosing.  Assessment: Vancomycin 1000 mg IV x 1 given on 1/31 @ 1644 Vanc rm 2/1 @ 1516: 10 ug/mL Patient remains in AKI (baseline Scr 1.5-18, current Scr 4.37)  Patient measurements: Weight: 61.7 kg (136 lb)  Vital signs: Temp: 98.1 F (36.7 C) (02/01 1552) Temp Source: Axillary (02/01 1552) BP: 119/56 (02/01 1552) Pulse Rate: 90 (02/01 1552) Recent Labs  Lab 01/30/24 1223 01/31/24 0458  WBC 19.4* 14.1*  CREATININE 5.16* 4.37*   Estimated Creatinine Clearance: 9 mL/min (A) (by C-G formula based on SCr of 4.37 mg/dL (H)).  Allergies: Allergies  Allergen Reactions   Aspirin Nausea And Vomiting   Sulfamethoxazole-Trimethoprim     Other reaction(s): Blood Disorder Coagulopathy    Antimicrobials this admission: Cefepime 1/31 >> Vancomycin 1/31 >>  Dose adjustments this admission: N/A  Microbiology results: 1/31 BCx: NGTD 1/31 Flu/Covid/RSV: negative  PLAN: Continue cefepime 1 g IV q24H Continue to dose vancomycin by level given current AKI Give Vancomycin 1000 mg IV x 1, will order vanc random for 2/2 @ 1600  Follow up culture results to assess for antibiotic optimization. Monitor renal function to assess for any necessary antibiotic dosing changes.   Thank you for allowing pharmacy to be a part of this patient's care.  Paulita Fujita, PharmD Clinical Pharmacist 01/31/2024 4:20 PM

## 2024-01-31 NOTE — Evaluation (Signed)
Occupational Therapy Evaluation Patient Details Name: Jon Smith MRN: [id number] DOB: [birth year] Today's Date: 01/31/2024   History of Present Illness Pt admitted to [facility] on 01/30/24 for c/o worsening PNA. Recent hospital admission on 1/20-1/26 for PNA and UTI. Dx revealed sepsis secondary to bilateral PNA and acute on chronic metabolic acidosis. Significant PMH includes: dementia, CKD, HTN, HLD, PrCA, anemia, PAD, back pain   Clinical Impression   Chart reviewed, pt greeted in bed, oriented to self and place, agreeable to OT evaluation. Son in room for portions of eval but stepped out to take ap hone call during mobility. Son does report pt is at [facility] for rehab after previous hospital stay in December 2024. Pt presents with deficits in strength, endurance, activity tolerance, balance, cognition, affecting safe and optimal ADL completion. MAX A required for bed mobility, MOD A for static sitting on edge of bed, MAX A for attempted lateral scoots up the bed. MAX A required for washing face, pt with ?brown build up on lips/around teeth, nurse in room at end of session and notified. Pt will benefit from acute OT to address deficits and to facilitate optimal ADL participation/performance. OT will follow.     If plan is discharge home, recommend the following: Two people to help with walking and/or transfers;A lot of help with bathing/dressing/bathroom;Supervision due to cognitive status    Functional Status Assessment  Patient has had a recent decline in their functional status and demonstrates the ability to make significant improvements in function in a reasonable and predictable amount of time.  Equipment Recommendations  Other (comment) (defer)    Recommendations for Other Services       Precautions / Restrictions Precautions Precautions: Fall Restrictions Other Position/Activity Restrictions: DNR/Limited      Mobility Bed Mobility Overal bed mobility: Needs Assistance Bed  Mobility: Sit to Supine, Supine to Sit     Supine to sit: Max assist Sit to supine: Max assist   General bed mobility comments: frequent multi modal cues    Transfers Overall transfer level: Needs assistance   Transfers: Bed to chair/wheelchair/BSC            Lateral/Scoot Transfers:  (MAX A for attempted lateral scoots up the bed) General transfer comment: pt assists with boost in bed with MOD A using bed features      Balance Overall balance assessment: Needs assistance Sitting-balance support: Feet supported Sitting balance-Leahy Scale: Poor Sitting balance - Comments: MOD A for static sitting balance                                   ADL either performed or assessed with clinical judgement   ADL Overall ADL's : Needs assistance/impaired     Grooming: Wash/dry face;Maximal assistance;Sitting           Upper Body Dressing : Moderate assistance;Sitting Upper Body Dressing Details (indicate cue type and reason): doff/donn gown Lower Body Dressing: Maximal assistance;Bed level Lower Body Dressing Details (indicate cue type and reason): socks     Toileting- Clothing Manipulation and Hygiene: Maximal assistance;Total assistance;Bed level               Vision Baseline Vision/History: 1 Wears glasses Patient Visual Report: No change from baseline       Perception         Praxis         Pertinent Vitals/Pain Pain Assessment Pain Assessment: No/denies pain  Extremity/Trunk Assessment Upper Extremity Assessment Upper Extremity Assessment: Generalized weakness   Lower Extremity Assessment Lower Extremity Assessment: Generalized weakness (B knee contractures, windswept to the R)       Communication Communication Communication: Hearing impairment Cueing Techniques: Verbal cues;Tactile cues;Visual cues;Gestural cues   Cognition Arousal: Alert Behavior During Therapy: Flat affect Overall Cognitive Status: Impaired/Different  from baseline Area of Impairment: Orientation, Attention, Memory, Following commands, Safety/judgement, Awareness, Problem solving                 Orientation Level: Disoriented to, Time, Situation Current Attention Level: Sustained Memory: Decreased short-term memory Following Commands: Follows one step commands with increased time (and frequent multi modal cues) Safety/Judgement: Decreased awareness of deficits Awareness: Intellectual Problem Solving: Slow processing, Difficulty sequencing, Requires verbal cues, Requires tactile cues       General Comments  spo2 >90% on RA throughout    Exercises Other Exercises Other Exercises: edu pt ZO:XWRU of OT, role of rehab   Shoulder Instructions      Home Living Family/patient expects to be discharged to:: Skilled nursing facility                                 Additional Comments: per son, pt does not walk, in bed much of the time but will get to [facility] with staff assist- per chart says lateral scoot, will need to confirm      Prior Functioning/Environment Prior Level of Function : Needs assist;History of Falls (last six months)  Cognitive Assist : ADLs (cognitive)   ADLs (Cognitive): Intermittent cues Physical Assist : ADLs (physical) Mobility (physical): Transfers ADLs (physical): Bathing;Dressing;Toileting;IADLs Mobility Comments: per son pt has not been out of bed recently, per chart pt lateral scoot transfers to mwc ADLs Comments: Increased assist from recent hospitalization approx 6 weeks ago for all ADL/IADL        OT Problem List: Decreased strength;Decreased activity tolerance;Decreased knowledge of use of DME or AE;Impaired balance (sitting and/or standing);Decreased cognition;Decreased safety awareness      OT Treatment/Interventions: Self-care/ADL training;DME and/or AE instruction;Therapeutic activities;Balance training;Therapeutic exercise;Patient/family education    OT Goals(Current goals  can be found in the care plan section) Acute Rehab OT Goals Patient Stated Goal: get up OT Goal Formulation: With patient Time For Goal Achievement: 02/14/24 Potential to Achieve Goals: Good ADL Goals Pt Will Perform Grooming: with mod assist Pt Will Transfer to Toilet: with max assist Pt Will Perform Toileting - Clothing Manipulation and hygiene: with max assist  OT Frequency: Min 1X/week    Co-evaluation              AM-PAC OT "6 Clicks" Daily Activity     Outcome Measure Help from another person eating meals?: A Lot Help from another person taking care of personal grooming?: A Lot Help from another person toileting, which includes using toliet, bedpan, or urinal?: Total Help from another person bathing (including washing, rinsing, drying)?: Total Help from another person to put on and taking off regular upper body clothing?: A Lot Help from another person to put on and taking off regular lower body clothing?: A Lot 6 Click Score: 10   End of Session Nurse Communication: Mobility status  Activity Tolerance: Patient tolerated treatment well Patient left: in bed;with call bell/phone within reach;with bed alarm set;with nursing/sitter in room  OT Visit Diagnosis: Other abnormalities of gait and mobility (R26.89);Muscle weakness (generalized) (M62.81);Unsteadiness on feet (R26.81)  Time: 4132-4401 OT Time Calculation (min): 16 min Charges:  OT General Charges $OT Visit: 1 Visit OT Evaluation $OT Eval Moderate Complexity: 1 Mod  Oleta Mouse, OTD OTR/L  01/31/24, 3:07 PM

## 2024-01-31 NOTE — Progress Notes (Signed)
Progress Note   Patient: Jon Smith WJX:[id number] DOB: 08/20/1928 DOA: 01/30/2024     1 DOS: the patient was seen and examined on 01/31/2024   Brief hospital course: Jon Smith is a 89 y.o. male with medical history significant of CKD stage IIIb, hyperlipidemia, hypertension, peripheral artery disease, who lives in a nursing home brought in with concerns of worsening pneumonia as seen on an x-ray obtained in the facility in the setting of persistent cough.  Patient was recently admitted here last month and managed for dehydration with AKI.  He is unable to provide subjective information on account of dementia.  History obtained from chart review as well as discussing with patient's son over the phone.  He denied nausea vomiting chest pain diarrhea or urinary complaints.   Upon arrival to the emergency room patient had temperature 97.7, blood pressure 115/49 respiratory rate 25 pulse 81.  CT scan of the brain did not show acute intracranial pathology, chest x-ray showed findings of bibasilar infiltrate.  Hospitalist service was contacted to admit patient for further management.    Assessment and Plan:   Sepsis secondary to bilateral pneumonia Patient presented with WBC 19, respiratory rate 25 in the setting of imaging showing bilateral pneumonia Follow-up on respiratory panel Given recent hospitalization will cover with Vanco and cefepime We will de-escalate antibiotics based on culture results Follow-up strep and Legionella test Lactic acid 1.1 procalcitonin 36   AKI on CKD 3B due to dehydration Acute on chronic metabolic acidosis Patient presenting with bicarb of 16 and creatinine elevated from baseline up to 5 Clinically dehydrated Continue IV fluid resuscitation Monitor renal function Nephrologist consulted   Acute on chronic anemia Chronic iron deficiency Folic acid deficiency Continue vitamin B12, folate acid as well as iron supplementation Monitor CBC closely No  indication for transfusion at this time   Mild hyponatremia secondary to dehydration Continue IV fluid therapy Monitor sodium levels   Dementia Placed on delirium precaution   Essential hypertension We will hold off antihypertensives at this time given relative hypotension   Generalized weakness PT OT consulted   BPH Continue tamsulosin   Hyperlipidemia Continue statin therapy   Prostate CA status post treatment In remission   Dementia Continue Zoloft   Lung nodule CT scan C-spine showed 2.5 mm posterior left apical noncalcified lung nodule, stable in size and appearance when compared to recent CT chest in April 2024 Outpatient follow-up     DVT prophylaxis: Heparin   Disposition: Back to skilled nursing facility when medically stable    Advance Care Planning:   Code Status: Prior DNI DNR   Consults: We will consult nephrology if renal function does not improve with hydration   Family Communication: Patient's son   Subjective:   Patient seen and examined at bedside this morning Denies nausea vomiting abdominal pain chest pain  Physical Exam:  General: Elderly male laying in bed in no acute distress HEENT: Atraumatic normocephalic Lungs: Decreased air entry bilaterally Abdomen: Soft nontender CNS: Awake very hard at hearing having generalized extremity weakness Vitals:   01/31/24 1008 01/31/24 1200 01/31/24 1236 01/31/24 1552  BP: (!) 132/59 130/60 131/66 (!) 119/56  Pulse: 100 94 89 90  Resp: 20 20 18 18   Temp: 97.9 F (36.6 C)  98.5 F (36.9 C) 98.1 F (36.7 C)  TempSrc:   Axillary Axillary  SpO2: 94% 95% 98% 100%  Weight:        Data Reviewed:    Latest Ref Rng & Units 01/31/2024  4:58 AM 01/30/2024   12:23 PM 12/11/2023    5:25 AM  CBC  WBC 4.0 - 10.5 K/uL 14.1  19.4  10.1   Hemoglobin 13.0 - 17.0 g/dL 7.4  8.3  8.9   Hematocrit 39.0 - 52.0 % 23.5  25.8  28.0   Platelets 150 - 400 K/uL 436  512  267        Latest Ref Rng & Units  01/31/2024    4:58 AM 01/30/2024   12:23 PM 12/11/2023    5:25 AM  BMP  Glucose 70 - 99 mg/dL 78  86  89   BUN 8 - 23 mg/dL 161  096  28   Creatinine 0.61 - 1.24 mg/dL 0.45  4.09  8.11   Sodium 135 - 145 mmol/L 150  149  133   Potassium 3.5 - 5.1 mmol/L 4.7  5.0  4.4   Chloride 98 - 111 mmol/L 122  115  105   CO2 22 - 32 mmol/L 13  16  18    Calcium 8.9 - 10.3 mg/dL 8.2  8.5  8.7      Time spent: 56 minutes  Author: Loyce Dys, MD 01/31/2024 4:57 PM  For on call review [email or web address].

## 2024-02-01 DIAGNOSIS — N179 Acute kidney failure, unspecified: Secondary | ICD-10-CM | POA: Diagnosis not present

## 2024-02-01 DIAGNOSIS — J189 Pneumonia, unspecified organism: Secondary | ICD-10-CM | POA: Diagnosis not present

## 2024-02-01 DIAGNOSIS — N189 Chronic kidney disease, unspecified: Secondary | ICD-10-CM | POA: Diagnosis not present

## 2024-02-01 LAB — CBC WITH DIFFERENTIAL/PLATELET
Abs Immature Granulocytes: 0.09 10*3/uL — ABNORMAL HIGH (ref 0.00–0.07)
Basophils Absolute: 0 10*3/uL (ref 0.0–0.1)
Basophils Relative: 0 %
Eosinophils Absolute: 0.1 10*3/uL (ref 0.0–0.5)
Eosinophils Relative: 1 %
HCT: 21.8 % — ABNORMAL LOW (ref 39.0–52.0)
Hemoglobin: 7.1 g/dL — ABNORMAL LOW (ref 13.0–17.0)
Immature Granulocytes: 1 %
Lymphocytes Relative: 7 %
Lymphs Abs: 0.8 10*3/uL (ref 0.7–4.0)
MCH: 27.5 pg (ref 26.0–34.0)
MCHC: 32.6 g/dL (ref 30.0–36.0)
MCV: 84.5 fL (ref 80.0–100.0)
Monocytes Absolute: 1.2 10*3/uL — ABNORMAL HIGH (ref 0.1–1.0)
Monocytes Relative: 11 %
Neutro Abs: 9.1 10*3/uL — ABNORMAL HIGH (ref 1.7–7.7)
Neutrophils Relative %: 80 %
Platelets: 422 10*3/uL — ABNORMAL HIGH (ref 150–400)
RBC: 2.58 MIL/uL — ABNORMAL LOW (ref 4.22–5.81)
RDW: 17.6 % — ABNORMAL HIGH (ref 11.5–15.5)
WBC: 11.3 10*3/uL — ABNORMAL HIGH (ref 4.0–10.5)
nRBC: 0 % (ref 0.0–0.2)

## 2024-02-01 LAB — BASIC METABOLIC PANEL
Anion gap: 11 (ref 5–15)
BUN: 95 mg/dL — ABNORMAL HIGH (ref 8–23)
CO2: 14 mmol/L — ABNORMAL LOW (ref 22–32)
Calcium: 8.2 mg/dL — ABNORMAL LOW (ref 8.9–10.3)
Chloride: 126 mmol/L — ABNORMAL HIGH (ref 98–111)
Creatinine, Ser: 3.66 mg/dL — ABNORMAL HIGH (ref 0.61–1.24)
GFR, Estimated: 15 mL/min — ABNORMAL LOW (ref >60)
Glucose, Bld: 75 mg/dL (ref 70–99)
Potassium: 4.4 mmol/L (ref 3.5–5.1)
Sodium: 151 mmol/L — ABNORMAL HIGH (ref 135–145)

## 2024-02-01 LAB — VANCOMYCIN, RANDOM: Vancomycin Rm: 16 ug/mL

## 2024-02-01 MED ORDER — MUPIROCIN 2 % EX OINT
TOPICAL_OINTMENT | Freq: Two times a day (BID) | CUTANEOUS | Status: DC
  Filled 2024-02-01: qty 22

## 2024-02-01 MED ORDER — VANCOMYCIN HCL 750 MG/150ML IV SOLN
750.0000 mg | Freq: Once | INTRAVENOUS | Status: DC
Start: 2024-02-01 — End: 2024-02-01
  Filled 2024-02-01: qty 150

## 2024-02-01 MED ORDER — SODIUM CHLORIDE 0.9 % IV SOLN
750.0000 mg | Freq: Once | INTRAVENOUS | Status: AC
  Administered 2024-02-01: 750 mg via INTRAVENOUS
  Filled 2024-02-01: qty 15

## 2024-02-01 NOTE — Progress Notes (Signed)
Progress Note   Patient: Jon Smith WUJ:[id number] DOB: 06-12-1928 DOA: 01/30/2024     2 DOS: the patient was seen and examined on 02/01/2024    Brief hospital course: Jon Smith is a 89 y.o. male with medical history significant of CKD stage IIIb, hyperlipidemia, hypertension, peripheral artery disease, who lives in a nursing home brought in with concerns of worsening pneumonia as seen on an x-ray obtained in the facility in the setting of persistent cough.  Patient was recently admitted here last month and managed for dehydration with AKI.  He is unable to provide subjective information on account of dementia.  History obtained from chart review as well as discussing with patient's son over the phone.  He denied nausea vomiting chest pain diarrhea or urinary complaints.   Upon arrival to the emergency room patient had temperature 97.7, blood pressure 115/49 respiratory rate 25 pulse 81.  CT scan of the brain did not show acute intracranial pathology, chest x-ray showed findings of bibasilar infiltrate.  Hospitalist service was contacted to admit patient for further management.     Assessment and Plan:    Sepsis secondary to bilateral pneumonia Patient presented with WBC 19, respiratory rate 25 in the setting of imaging showing bilateral pneumonia Respiratory panel is negative Given recent hospitalization will cover with Vanco and cefepime MRSA in the nares positive, placed on mupirocin We will de-escalate antibiotics based on culture results Follow-up strep and Legionella test Lactic acid 1.1 procalcitonin 36   AKI on CKD 3B due to dehydration Acute on chronic metabolic acidosis Patient presenting with bicarb of 16 and creatinine elevated from baseline up to 5 Clinically dehydrated Continue IV fluid Monitor renal function Nephrologist on board and case discussed   Acute on chronic anemia Chronic iron deficiency Folic acid deficiency Continue vitamin B12, folate acid as well  as iron supplementation Monitor CBC closely No indication for transfusion at this time   Mild hyponatremia secondary to dehydration Continue IV fluid therapy Monitor sodium levels   Dementia Placed on delirium precaution   Essential hypertension We will hold off antihypertensives at this time given relative hypotension   Generalized weakness Continue PT OT   BPH Continue tamsulosin   Hyperlipidemia Continue statin therapy   Prostate CA status post treatment In remission   Dementia Continue Zoloft   Lung nodule CT scan C-spine showed 2.5 mm posterior left apical noncalcified lung nodule, stable in size and appearance when compared to recent CT chest in April 2024 Outpatient follow-up     DVT prophylaxis: Heparin   Disposition: Back to skilled nursing facility when medically stable    Advance Care Planning:   Code Status: Prior DNI DNR   Consults: We will consult nephrology if renal function does not improve with hydration   Family Communication: Patient's son     Subjective:  Patient seen and examined at bedside this morning According to speech therapist patient is not safe for oral intake Palliative medicine has been consulted for goals of care discussion   Physical Exam:   General: Elderly male laying in bed in no acute distress HEENT: Atraumatic normocephalic Lungs: Decreased air entry bilaterally Abdomen: Soft nontender CNS: Awake very hard at hearing having generalized extremity weakness    Data Reviewed:    Latest Ref Rng & Units 02/01/2024    5:29 AM 01/31/2024    4:58 AM 01/30/2024   12:23 PM  CBC  WBC 4.0 - 10.5 K/uL 11.3  14.1  19.4   Hemoglobin 13.0 -  17.0 g/dL 7.1  7.4  8.3   Hematocrit 39.0 - 52.0 % 21.8  23.5  25.8   Platelets 150 - 400 K/uL 422  436  512        Latest Ref Rng & Units 02/01/2024    5:29 AM 01/31/2024    4:58 AM 01/30/2024   12:23 PM  BMP  Glucose 70 - 99 mg/dL 75  78  86   BUN 8 - 23 mg/dL 95  161  096   Creatinine  0.61 - 1.24 mg/dL 0.45  4.09  8.11   Sodium 135 - 145 mmol/L 151  150  149   Potassium 3.5 - 5.1 mmol/L 4.4  4.7  5.0   Chloride 98 - 111 mmol/L 126  122  115   CO2 22 - 32 mmol/L 14  13  16    Calcium 8.9 - 10.3 mg/dL 8.2  8.2  8.5     Vitals:   01/31/24 1552 01/31/24 2046 02/01/24 0354 02/01/24 0820  BP: (!) 119/56 134/62 136/71 (!) 156/69  Pulse: 90 94 91 99  Resp: 18 20 16 16   Temp: 98.1 F (36.7 C) 99.4 F (37.4 C) 98.6 F (37 C)   TempSrc: Axillary     SpO2: 100% 98% 97% 100%  Weight:        Author: Loyce Dys, MD 02/01/2024 2:19 PM  For on call review [email or web address].

## 2024-02-01 NOTE — Evaluation (Signed)
Clinical/Bedside Swallow Evaluation Patient Details  Name: Jon Smith MRN: [id number] Date of Birth: [birth year]  Today's Date: 02/01/2024 Time: SLP Start Time (ACUTE ONLY): 0915 SLP Stop Time (ACUTE ONLY): 0930 SLP Time Calculation (min) (ACUTE ONLY): 15 min  Past Medical History:  Past Medical History:  Diagnosis Date   Back pain    Elevated LFTs    Hepatitis    HLD (hyperlipidemia)    Hypertension    Incomplete bladder emptying    Lower extremity ulceration (HCC)    Nocturia    Peripheral arterial disease (HCC)    Prostate cancer (HCC) remote   Cryotherapy by Dr. Orson Slick.  PSA 5.54 in September at [facility]   Renal stones    Past Surgical History:  Past Surgical History:  Procedure Laterality Date   AORTOGRAM     PERITONEAL CATHETER INSERTION     PROSTATE SURGERY     TRANSLUMINAL ANGIOPLASTY     HPI:  Jon Smith is a 89 y.o. male with medical history significant of CKD stage IIIb, hyperlipidemia, hypertension, peripheral artery disease, who lives in a nursing home brought in on 01/30/2024 with concerns of worsening pneumonia as seen on an x-ray obtained in the facility in the setting of persistent cough.  Patient was recently admitted here last month and managed for dehydration with AKI.  He is unable to provide subjective information on account of dementia. CT scan of the brain did not show acute intracranial pathology, chest x-ray showed findings of bibasilar infiltrate.   04/12/2023 BSE - recommended dysphagia 3 diet with thin liquids, medicine whole with puree    Assessment / Plan / Recommendation  Clinical Impression  Pt presents with confused, unable to follow basic 1-step directions and continued to state "leave me alone, please leave me alone" when attempts were made to reposition him in the bed. Pt opened his mouth to receive ice chips x 3 via spoon. Given maximal verbal and tactile cues, pt with increased oral interaction with ice chips but in the absence of cues, pt  appears to hold the ice chips/lose attention to them. When swallow appeared, he presented with immediate wet vocal quality, throat clears and coughing. At this time, pt appears to have what is likely a cognitive based dysphagia and is at a great risk of aspiration with POs. Would recommend pt be NPO with ST services to follow for improvement in mentation and possible safety with further trials. Palliative Care consult recommended. Prognosis is guarded for a safe return to sufficient PO consumption. SLP Visit Diagnosis: Dysphagia, oropharyngeal phase (R13.12)    Aspiration Risk  Severe aspiration risk;Risk for inadequate nutrition/hydration    Diet Recommendation NPO    Medication Administration: Via alternative means    Other  Recommendations   Palliative Care consult   Recommendations for follow up therapy are one component of a multi-disciplinary discharge planning process, led by the attending physician.  Recommendations may be updated based on patient status, additional functional criteria and insurance authorization.  Follow up Recommendations Follow physician's recommendations for discharge plan and follow up therapies (Palliative Care consult)      Assistance Recommended at Discharge  N/A  Functional Status Assessment Patient has had a recent decline in their functional status and/or demonstrates limited ability to make significant improvements in function in a reasonable and predictable amount of time  Frequency and Duration min 2x/week  2 weeks       Prognosis Prognosis for improved oropharyngeal function: Guarded Barriers to Reach Goals: Cognitive deficits;Motivation;Time  post onset;Severity of deficits      Swallow Study   General Date of Onset: 01/30/24 HPI: Jon Smith is a 89 y.o. male with medical history significant of CKD stage IIIb, hyperlipidemia, hypertension, peripheral artery disease, who lives in a nursing home brought in on 01/30/2024 with concerns of  worsening pneumonia as seen on an x-ray obtained in the facility in the setting of persistent cough.  Patient was recently admitted here last month and managed for dehydration with AKI.  He is unable to provide subjective information on account of dementia. CT scan of the brain did not show acute intracranial pathology, chest x-ray showed findings of bibasilar infiltrate.   04/12/2023 BSE - recommended dysphagia 3 diet with thin liquids, medicine whole with puree Type of Study: Bedside Swallow Evaluation Previous Swallow Assessment: yes, 04/12/2023 Diet Prior to this Study: Regular;Thin liquids (Level 0) Temperature Spikes Noted: No Respiratory Status: Room air History of Recent Intubation: No Behavior/Cognition: Confused;Agitated;Uncooperative;Requires cueing;Doesn't follow directions Oral Cavity Assessment: Dry Oral Care Completed by SLP: Recent completion by staff Oral Cavity - Dentition: Poor condition;Missing dentition Self-Feeding Abilities: Total assist Patient Positioning: Upright in bed Baseline Vocal Quality: Wet Volitional Cough: Cognitively unable to elicit Volitional Swallow: Unable to elicit    Oral/Motor/Sensory Function Overall Oral Motor/Sensory Function: Generalized oral weakness   Ice Chips Ice chips: Impaired Presentation: Spoon Oral Phase Impairments: Reduced lingual movement/coordination;Impaired mastication;Poor awareness of bolus Oral Phase Functional Implications: Oral holding;Prolonged oral transit Pharyngeal Phase Impairments: Suspected delayed Swallow;Decreased hyoid-laryngeal movement;Wet Vocal Quality;Throat Clearing - Immediate;Cough - Immediate   Thin Liquid Thin Liquid: Not tested    Nectar Thick Nectar Thick Liquid: Not tested   Honey Thick Honey Thick Liquid: Not tested   Puree Puree: Not tested   Solid     Solid: Not tested     Aayliah Rotenberry B. Dreama Saa, M.S., CCC-SLP, Tree surgeon Certified Brain Injury Specialist [facility]   [facility] Office [phone] Ascom [phone] Fax [phone]

## 2024-02-01 NOTE — Progress Notes (Signed)
[facility]  PROGRESS NOTE   Subjective:   Patient seen at bedside.  Awake and alert.  Objective:  Vital signs: Blood pressure (!) 156/69, pulse 99, temperature 98.6 F (37 C), resp. rate 16, weight 61.7 kg, SpO2 100%.  Intake/Output Summary (Last 24 hours) at 02/01/2024 1206 Last data filed at 02/01/2024 0534 Gross per 24 hour  Intake 1903.32 ml  Output 800 ml  Net 1103.32 ml   Filed Weights   01/30/24 1217  Weight: 61.7 kg     Physical Exam: General:  No acute distress  Head:  Normocephalic, atraumatic. Moist oral mucosal membranes  Eyes:  Anicteric  Neck:  Supple  Lungs:   Clear to auscultation, normal effort  Heart:  S1S2 no rubs  Abdomen:   Soft, nontender, bowel sounds present  Extremities:  peripheral edema.  Neurologic:  Awake, alert, following commands  Skin:  No lesions  Access:     Basic Metabolic Panel: Recent Labs  Lab 01/30/24 1223 01/31/24 0458 02/01/24 0529  NA 149* 150* 151*  K 5.0 4.7 4.4  CL 115* 122* 126*  CO2 16* 13* 14*  GLUCOSE 86 78 75  BUN 110* 106* 95*  CREATININE 5.16* 4.37* 3.66*  CALCIUM 8.5* 8.2* 8.2*   GFR: Estimated Creatinine Clearance: 10.8 mL/min (A) (by C-G formula based on SCr of 3.66 mg/dL (H)).  Liver Function Tests: Recent Labs  Lab 01/30/24 1223  AST 43*  ALT 26  ALKPHOS 73  BILITOT 0.9  PROT 7.0  ALBUMIN 2.6*   No results for input(s): "LIPASE", "AMYLASE" in the last 168 hours. No results for input(s): "AMMONIA" in the last 168 hours.  CBC: Recent Labs  Lab 01/30/24 1223 01/31/24 0458 02/01/24 0529  WBC 19.4* 14.1* 11.3*  NEUTROABS 17.2* 12.0* 9.1*  HGB 8.3* 7.4* 7.1*  HCT 25.8* 23.5* 21.8*  MCV 86.6 87.7 84.5  PLT 512* 436* 422*     HbA1C: Hgb A1c MFr Bld  Date/Time Value Ref Range Status  12/05/2023 05:56 AM 5.4 4.8 - 5.6 % Final    Comment:    (NOTE) Pre diabetes:          5.7%-6.4%  Diabetes:              >6.4%  Glycemic control for   <7.0% adults with diabetes      Urinalysis: No results for input(s): "COLORURINE", "LABSPEC", "PHURINE", "GLUCOSEU", "HGBUR", "BILIRUBINUR", "KETONESUR", "PROTEINUR", "UROBILINOGEN", "NITRITE", "LEUKOCYTESUR" in the last 72 hours.  Invalid input(s): "APPERANCEUR"    Imaging: CT Head Wo Contrast Result Date: 01/30/2024 CLINICAL DATA:  Altered mental status EXAM: CT HEAD WITHOUT CONTRAST TECHNIQUE: Contiguous axial images were obtained from the base of the skull through the vertex without intravenous contrast. RADIATION DOSE REDUCTION: This exam was performed according to the departmental dose-optimization program which includes automated exposure control, adjustment of the mA and/or kV according to patient size and/or use of iterative reconstruction technique. COMPARISON:  12/04/2023 FINDINGS: Brain: No evidence of acute infarction, hemorrhage, mass, mass effect, or midline shift. No hydrocephalus or extra-axial fluid collection. Moderate cerebral atrophy. Remote infarcts in bilateral basal ganglia, left cerebellum, and pons. Periventricular white matter changes, likely the sequela of chronic small vessel ischemic disease. Vascular: No hyperdense vessel. Atherosclerotic calcifications in the intracranial carotid and vertebral arteries. Skull: Negative for fracture or focal lesion. Sinuses/Orbits: No acute finding. Other: The mastoid air cells are well aerated. IMPRESSION: No acute intracranial process. Electronically Signed   By: Wiliam Ke M.D.   On: 01/30/2024  16:56   DG Chest 1 View Result Date: 01/30/2024 CLINICAL DATA:  Recently treated for pneumonia with worsening radiographic findings EXAM: CHEST  1 VIEW COMPARISON:  Chest radiograph dated 12/04/2023 FINDINGS: Patient is rotated to the right. Normal lung volumes. Patchy bibasilar opacities. No pleural effusion or pneumothorax. The heart size and mediastinal contours are within normal limits. No acute osseous abnormality. IMPRESSION: Patchy bibasilar opacities, which may  represent atelectasis or reported residual pneumonia. Electronically Signed   By: Agustin Cree M.D.   On: 01/30/2024 13:27     Medications:    sodium chloride 75 mL/hr at 01/31/24 1512   ceFEPime (MAXIPIME) IV 1 g (01/31/24 1701)    atorvastatin  40 mg Oral Daily   clopidogrel  75 mg Oral Daily   cyanocobalamin  1,000 mcg Oral Daily   feeding supplement  237 mL Oral BID BM   ferrous sulfate  325 mg Oral Q breakfast   finasteride  5 mg Oral Daily   folic acid  1 mg Oral Daily   heparin injection (subcutaneous)  5,000 Units Subcutaneous Q8H   sertraline  50 mg Oral Daily   tamsulosin  0.4 mg Oral Daily   vancomycin variable dose per unstable renal function (pharmacist dosing)   Does not apply See admin instructions    Assessment/ Plan:     89 y.o. male with a PMHx of hypertension, hyperlipidemia, peripheral vascular disease, chronic kidney disease who is a long-term nursing home patient now comes in with persistent cough.  He is being treated for pneumonia in the emergency room.  He is now found to have acute kidney injury.  He has been on lisinopril at 40 mg daily.  He has a baseline creatinine of about 1.7.  He had a renal sonogram last month which did not show any obstruction.   Principal Problem:   Bilateral pneumonia   #1: Acute kidney injury on chronic kidney disease: Patient has CKD stage IIIb with a baseline creatinine of 1.7.  Patient now has acute kidney injury which is most likely secondary to prerenal azotemia due to decreased p.o. intake complicated by ATN secondary to sepsis.  IV fluids changed to half-normal saline yesterday and is presently at 75 cc an hour.    #2: Metabolic acidosis: Metabolic acidosis can be secondary to sepsis complicated by acute kidney injury.  No need for IV bicarb at this time.   #3: Sepsis: Patient has been on vancomycin and cefepime.  Will monitor Vanco levels closely.   #4: Hypernatremia: Possibly secondary to decreased free water intake.  Will   continue the IV fluids with  half-normal saline.  Labs and medications reviewed. Will continue to follow along with you.   LOS: 2 Lorain Childes, MD [facility] 2/2/202512:06 PM

## 2024-02-01 NOTE — Progress Notes (Signed)
 Patient resting in bed with eyes closed. No needs identified.

## 2024-02-01 NOTE — Consult Note (Signed)
Pharmacy Antibiotic Note  ASSESSMENT: 89 y.o. male with PMH including CKD3b, HLD, HTN, PAD is presenting with pneumonia / HCAP. Patient is in AKI. CXR shows bilateral PNA. He has leukocytosis but is VSS and on RA, Pharmacy has been consulted to manage cefepime and vancomycin dosing.  Assessment: Vancomycin 1000 mg IV x 1 given on 2/1 @ 1754 Vanc rm 2/2 @ 1542: 16 Patient remains in AKI but is improving (baseline Scr 1.5-1.8, current Scr 3.66)  Patient measurements: Weight: 61.7 kg (136 lb)  Vital signs: Temp: 98.6 F (37 C) (02/02 0354) BP: 156/69 (02/02 0820) Pulse Rate: 99 (02/02 0820) Recent Labs  Lab 01/30/24 1223 01/31/24 0458 02/01/24 0529  WBC 19.4* 14.1* 11.3*  CREATININE 5.16* 4.37* 3.66*   Estimated Creatinine Clearance: 10.8 mL/min (A) (by C-G formula based on SCr of 3.66 mg/dL (H)).  Allergies: Allergies  Allergen Reactions   Aspirin Nausea And Vomiting   Sulfamethoxazole-Trimethoprim     Other reaction(s): Blood Disorder Coagulopathy    Antimicrobials this admission: Cefepime 1/31 >> Vancomycin 1/31 >>  Dose adjustments this admission: N/A  Microbiology results: 1/31 BCx: NGTD 1/31 Flu/Covid/RSV: negative 2/1 MRSA PCR: positive  PLAN: Continue cefepime 1 g IV q24H Continue to dose vancomycin by level given current AKI Give Vancomycin 750 mg IV x 1, will schedule dose out a couple of hours given level is barely >15 Will order vanc random for 2/3 @ 2100  Follow up culture results to assess for antibiotic optimization. Monitor renal function to assess for any necessary antibiotic dosing changes.   Thank you for allowing pharmacy to be a part of this patient's care.  Paulita Fujita, PharmD Clinical Pharmacist 02/01/2024 3:53 PM

## 2024-02-01 NOTE — Plan of Care (Signed)
  Problem: Clinical Measurements: Goal: Ability to maintain clinical measurements within normal limits will improve Outcome: Progressing Goal: Diagnostic test results will improve Outcome: Progressing Goal: Respiratory complications will improve Outcome: Progressing Goal: Cardiovascular complication will be avoided Outcome: Progressing   

## 2024-02-02 DIAGNOSIS — N189 Chronic kidney disease, unspecified: Secondary | ICD-10-CM | POA: Diagnosis not present

## 2024-02-02 DIAGNOSIS — Z515 Encounter for palliative care: Secondary | ICD-10-CM

## 2024-02-02 DIAGNOSIS — J189 Pneumonia, unspecified organism: Secondary | ICD-10-CM | POA: Diagnosis not present

## 2024-02-02 DIAGNOSIS — N179 Acute kidney failure, unspecified: Secondary | ICD-10-CM | POA: Diagnosis not present

## 2024-02-02 LAB — CBC WITH DIFFERENTIAL/PLATELET
Abs Immature Granulocytes: 0.13 10*3/uL — ABNORMAL HIGH (ref 0.00–0.07)
Basophils Absolute: 0.1 10*3/uL (ref 0.0–0.1)
Basophils Relative: 1 %
Eosinophils Absolute: 0.1 10*3/uL (ref 0.0–0.5)
Eosinophils Relative: 0 %
HCT: 25.3 % — ABNORMAL LOW (ref 39.0–52.0)
Hemoglobin: 8 g/dL — ABNORMAL LOW (ref 13.0–17.0)
Immature Granulocytes: 1 %
Lymphocytes Relative: 8 %
Lymphs Abs: 1 10*3/uL (ref 0.7–4.0)
MCH: 27 pg (ref 26.0–34.0)
MCHC: 31.6 g/dL (ref 30.0–36.0)
MCV: 85.5 fL (ref 80.0–100.0)
Monocytes Absolute: 1.3 10*3/uL — ABNORMAL HIGH (ref 0.1–1.0)
Monocytes Relative: 10 %
Neutro Abs: 10.4 10*3/uL — ABNORMAL HIGH (ref 1.7–7.7)
Neutrophils Relative %: 80 %
Platelets: 474 10*3/uL — ABNORMAL HIGH (ref 150–400)
RBC: 2.96 MIL/uL — ABNORMAL LOW (ref 4.22–5.81)
RDW: 18.1 % — ABNORMAL HIGH (ref 11.5–15.5)
WBC: 12.9 10*3/uL — ABNORMAL HIGH (ref 4.0–10.5)
nRBC: 0 % (ref 0.0–0.2)

## 2024-02-02 LAB — BASIC METABOLIC PANEL
Anion gap: 15 (ref 5–15)
BUN: 88 mg/dL — ABNORMAL HIGH (ref 8–23)
CO2: 12 mmol/L — ABNORMAL LOW (ref 22–32)
Calcium: 8.5 mg/dL — ABNORMAL LOW (ref 8.9–10.3)
Chloride: 128 mmol/L — ABNORMAL HIGH (ref 98–111)
Creatinine, Ser: 3.31 mg/dL — ABNORMAL HIGH (ref 0.61–1.24)
GFR, Estimated: 17 mL/min — ABNORMAL LOW (ref >60)
Glucose, Bld: 72 mg/dL (ref 70–99)
Potassium: 4.9 mmol/L (ref 3.5–5.1)
Sodium: 155 mmol/L — ABNORMAL HIGH (ref 135–145)

## 2024-02-02 LAB — VANCOMYCIN, RANDOM: Vancomycin Rm: 18 ug/mL

## 2024-02-02 MED ORDER — MEDIHONEY WOUND/BURN DRESSING EX PSTE
1.0000 | PASTE | Freq: Every day | CUTANEOUS | Status: DC
  Administered 2024-02-03 – 2024-02-04 (×2): 1 via TOPICAL
  Filled 2024-02-02: qty 44

## 2024-02-02 MED ORDER — SODIUM BICARBONATE 650 MG PO TABS
1300.0000 mg | ORAL_TABLET | Freq: Three times a day (TID) | ORAL | Status: DC
Start: 2024-02-02 — End: 2024-02-03
  Filled 2024-02-02: qty 2

## 2024-02-02 MED ORDER — HYDRALAZINE HCL 20 MG/ML IJ SOLN: 10.0000 mg | Freq: Four times a day (QID) | INTRAMUSCULAR | Status: DC | PRN

## 2024-02-02 MED ORDER — DEXTROSE 5 % IV SOLN: INTRAVENOUS | Status: AC

## 2024-02-02 NOTE — Progress Notes (Signed)
[facility]  PROGRESS NOTE   Subjective:   Patient seen resting in bed No family present Somnolent  Sodium 155  Objective:  Vital signs: Blood pressure (!) 142/69, pulse (!) 108, temperature 97.8 F (36.6 C), temperature source Axillary, resp. rate 16, weight 61.7 kg, SpO2 98%.  Intake/Output Summary (Last 24 hours) at 02/02/2024 1341 Last data filed at 02/02/2024 0900 Gross per 24 hour  Intake --  Output 500 ml  Net -500 ml   Filed Weights   01/30/24 1217  Weight: 61.7 kg     Physical Exam: General:  No acute distress  Head:  Normocephalic, atraumatic. Moist oral mucosal membranes  Eyes:  Anicteric  Neck:  Supple  Lungs:   Clear to auscultation, normal effort  Heart:  S1S2 no rubs  Abdomen:   Soft, nontender, bowel sounds present  Extremities:  peripheral edema.  Neurologic:  Awake, alert, following commands  Skin:  No lesions  Access: None    Basic Metabolic Panel: Recent Labs  Lab 01/30/24 1223 01/31/24 0458 02/01/24 0529 02/02/24 0536  NA 149* 150* 151* 155*  K 5.0 4.7 4.4 4.9  CL 115* 122* 126* 128*  CO2 16* 13* 14* 12*  GLUCOSE 86 78 75 72  BUN 110* 106* 95* 88*  CREATININE 5.16* 4.37* 3.66* 3.31*  CALCIUM 8.5* 8.2* 8.2* 8.5*   GFR: Estimated Creatinine Clearance: 11.9 mL/min (A) (by C-G formula based on SCr of 3.31 mg/dL (H)).  Liver Function Tests: Recent Labs  Lab 01/30/24 1223  AST 43*  ALT 26  ALKPHOS 73  BILITOT 0.9  PROT 7.0  ALBUMIN 2.6*   No results for input(s): "LIPASE", "AMYLASE" in the last 168 hours. No results for input(s): "AMMONIA" in the last 168 hours.  CBC: Recent Labs  Lab 01/30/24 1223 01/31/24 0458 02/01/24 0529 02/02/24 0536  WBC 19.4* 14.1* 11.3* 12.9*  NEUTROABS 17.2* 12.0* 9.1* 10.4*  HGB 8.3* 7.4* 7.1* 8.0*  HCT 25.8* 23.5* 21.8* 25.3*  MCV 86.6 87.7 84.5 85.5  PLT 512* 436* 422* 474*     HbA1C: Hgb A1c MFr Bld  Date/Time Value Ref Range Status  12/05/2023 05:56 AM 5.4 4.8 - 5.6  % Final    Comment:    (NOTE) Pre diabetes:          5.7%-6.4%  Diabetes:              >6.4%  Glycemic control for   <7.0% adults with diabetes     Urinalysis: No results for input(s): "COLORURINE", "LABSPEC", "PHURINE", "GLUCOSEU", "HGBUR", "BILIRUBINUR", "KETONESUR", "PROTEINUR", "UROBILINOGEN", "NITRITE", "LEUKOCYTESUR" in the last 72 hours.  Invalid input(s): "APPERANCEUR"    Imaging: No results found.    Medications:    ceFEPime (MAXIPIME) IV 1 g (02/01/24 2108)    atorvastatin  40 mg Oral Daily   clopidogrel  75 mg Oral Daily   cyanocobalamin  1,000 mcg Oral Daily   feeding supplement  237 mL Oral BID BM   ferrous sulfate  325 mg Oral Q breakfast   finasteride  5 mg Oral Daily   folic acid  1 mg Oral Daily   heparin injection (subcutaneous)  5,000 Units Subcutaneous Q8H   [START ON 02/03/2024] leptospermum manuka honey  1 Application Topical Daily   mupirocin ointment   Nasal BID   sertraline  50 mg Oral Daily   tamsulosin  0.4 mg Oral Daily   vancomycin variable dose per unstable renal function (pharmacist dosing)   Does not apply See admin  instructions    Assessment/ Plan:     89 y.o. male with a PMHx of hypertension, hyperlipidemia, peripheral vascular disease, chronic kidney disease who is a long-term nursing home patient now comes in with persistent cough.  He is being treated for pneumonia in the emergency room.  He is now found to have acute kidney injury.  He has been on lisinopril at 40 mg daily.  He has a baseline creatinine of about 1.7.  He had a renal sonogram last month which did not show any obstruction.   Principal Problem:   Bilateral pneumonia   #1: Acute kidney injury on chronic kidney disease: Patient has CKD stage IIIb with a baseline creatinine of 1.7.  Patient now has acute kidney injury which is most likely secondary to prerenal azotemia due to decreased p.o. intake complicated by ATN secondary to sepsis.      Creatinine has improved  slightly with IV fluids.  Will continue them for now.  No acute indication for dialysis.  #2:  Acute metabolic acidosis: Metabolic acidosis can be secondary to sepsis complicated by acute kidney injury.  Serum bicarb 12.  Will order oral sodium bicarb and 1300 mg 3 times a day.   #3: Sepsis: Patient has been on vancomycin and cefepime.  Will monitor Vanco levels closely.   #4: Hypernatremia: Possibly secondary to decreased free water intake.  Sodium remains 155.  No IV fluids ordered currently.  Will order D5 at 75 mL/h.     LOS: 3 [facility] 2/3/20251:41 PM

## 2024-02-02 NOTE — Progress Notes (Addendum)
Progress Note   Patient: Jon Smith GNF:[id number] DOB: 11-04-1928 DOA: 01/30/2024     3 DOS: the patient was seen and examined on 02/02/2024    Brief hospital course: Jon Smith is a 89 y.o. male with medical history significant of CKD stage IIIb, hyperlipidemia, hypertension, peripheral artery disease, who lives in a nursing home brought in with concerns of worsening pneumonia as seen on an x-ray obtained in the facility in the setting of persistent cough.  Patient was recently admitted here last month and managed for dehydration with AKI.  He is unable to provide subjective information on account of dementia.  History obtained from chart review as well as discussing with patient's son over the phone.  He denied nausea vomiting chest pain diarrhea or urinary complaints.   Upon arrival to the emergency room patient had temperature 97.7, blood pressure 115/49 respiratory rate 25 pulse 81.  CT scan of the brain did not show acute intracranial pathology, chest x-ray showed findings of bibasilar infiltrate.  Hospitalist service was contacted to admit patient for further management.     Assessment and Plan:    Sepsis secondary to bilateral pneumonia Patient presented with WBC 19, respiratory rate 25 in the setting of imaging showing bilateral pneumonia Respiratory panel is negative Given recent hospitalization will cover with Vanco and cefepime MRSA in the nares positive, placed on mupirocin We will de-escalate antibiotics based on culture results Follow-up strep and Legionella test Lactic acid 1.1 procalcitonin 36   Hypernatremia likely secondary to decreased oral intake Continue D5W Monitor closely  AKI on CKD 3B due to dehydration Acute on chronic metabolic acidosis Patient presenting with bicarb of 16 and creatinine elevated from baseline up to 5 Clinically dehydrated Continue IV fluid Monitor renal function Nephrologist on board and case discussed   Acute on chronic  anemia Chronic iron deficiency Folic acid deficiency Continue vitamin B12, folate acid as well as iron supplementation Monitor CBC closely No indication for transfusion at this time   Mild hyponatremia secondary to dehydration Continue IV fluid therapy Monitor sodium levels   Dementia Placed on delirium precaution   Essential hypertension Unable to give antihypertensives on account of n.p.o. We will give as needed IV hydralazine   Generalized weakness Continue PT OT   BPH Continue tamsulosin   Hyperlipidemia Continue statin therapy   Prostate CA status post treatment In remission   Dementia Continue Zoloft   Lung nodule CT scan C-spine showed 2.5 mm posterior left apical noncalcified lung nodule, stable in size and appearance when compared to recent CT chest in April 2024 Outpatient follow-up     DVT prophylaxis: Heparin   Disposition: Back to skilled nursing facility when medically stable    Advance Care Planning:   Code Status: Prior DNI DNR   Consults: We will consult nephrology if renal function does not improve with hydration   Family Communication: Patient's son     Subjective:  I discussed with patient's niece and her husband at bedside Patient's son is considering coming in tomorrow to discuss with palliative team Patient did have increased work of breathing today compared to yesterday   Physical Exam:   General: Elderly male laying in bed in no acute distress HEENT: Atraumatic normocephalic Lungs: Decreased air entry bilaterally Abdomen: Soft nontender CNS: Awake very hard at hearing having generalized extremity weakness     Data Reviewed:  Vitals:   02/02/24 0541 02/02/24 0741 02/02/24 1141 02/02/24 1541  BP: (!) 159/72 (!) 156/73 (!) 142/69 129/74  Pulse: (!) 110 (!) 110 (!) 108 (!) 103  Resp:    20  Temp: 98.3 F (36.8 C) 98.6 F (37 C) 97.8 F (36.6 C) 98 F (36.7 C)  TempSrc:  Oral Axillary Axillary  SpO2: 98% 100% 98% 98%   Weight:          Latest Ref Rng & Units 02/02/2024    5:36 AM 02/01/2024    5:29 AM 01/31/2024    4:58 AM  CBC  WBC 4.0 - 10.5 K/uL 12.9  11.3  14.1   Hemoglobin 13.0 - 17.0 g/dL 8.0  7.1  7.4   Hematocrit 39.0 - 52.0 % 25.3  21.8  23.5   Platelets 150 - 400 K/uL 474  422  436        Latest Ref Rng & Units 02/02/2024    5:36 AM 02/01/2024    5:29 AM 01/31/2024    4:58 AM  BMP  Glucose 70 - 99 mg/dL 72  75  78   BUN 8 - 23 mg/dL 88  95  604   Creatinine 0.61 - 1.24 mg/dL 5.40  9.81  1.91   Sodium 135 - 145 mmol/L 155  151  150   Potassium 3.5 - 5.1 mmol/L 4.9  4.4  4.7   Chloride 98 - 111 mmol/L 128  126  122   CO2 22 - 32 mmol/L 12  14  13    Calcium 8.9 - 10.3 mg/dL 8.5  8.2  8.2      Author: Loyce Dys, MD 02/02/2024 5:17 PM  For on call review [email or web address].

## 2024-02-02 NOTE — Progress Notes (Signed)
 Patient resting in bed with eyes closed. No needs identified.

## 2024-02-02 NOTE — Progress Notes (Signed)
Physical Therapy Treatment Patient Details Name: Jon Smith MRN: [id number] DOB: 1928/09/10 Today's Date: 02/02/2024   History of Present Illness Pt admitted to [facility] on 01/30/24 for c/o worsening PNA. Recent hospital admission on 1/20-1/26 for PNA and UTI. Dx revealed sepsis secondary to bilateral PNA and acute on chronic metabolic acidosis. Significant PMH includes: dementia, CKD, HTN, HLD, PrCA, anemia, PAD, back pain    PT Comments  Pt was pleasant and motivated to participate during the session and put forth good effort throughout. Pt found in right wind swept position.  Gentle bilateral knee extension PROM provided in supine to patient's tolerance.  Pt required near total assist to come to sitting at the EOB and once in sitting initially required heavy assist to prevent LOB but with time and weight shifting eventually progressed to just needing occasional min A for stability.  While sitting at the EOB pt participated with AAROM to the knees and progressed to where he was lacking grossly 20 deg of knee extension.  Pt reported no adverse symptoms during the session with SpO2 and HR WNL on room air.  Pt will benefit from a trial of continued PT services upon discharge to safely address deficits listed in patient problem list for decreased caregiver assistance and eventual return to PLOF.       If plan is discharge home, recommend the following: Two people to help with walking and/or transfers;A lot of help with bathing/dressing/bathroom;Assistance with cooking/housework;Help with stairs or ramp for entrance;Assist for transportation;Direct supervision/assist for financial management;Direct supervision/assist for medications management   Can travel by private vehicle     No  Equipment Recommendations  None recommended by PT    Recommendations for Other Services       Precautions / Restrictions Precautions Precautions: Fall Restrictions Weight Bearing Restrictions Per Provider Order: No      Mobility  Bed Mobility Overal bed mobility: Needs Assistance Bed Mobility: Sit to Supine, Supine to Sit     Supine to sit: +2 for physical assistance, Max assist Sit to supine: Max assist, +2 for physical assistance   General bed mobility comments: Mod verbal and tactile cues for sequencing    Transfers                   General transfer comment: unable/unsafe to attempt secondary to poor sitting balance and bilateral knee flex contractures    Ambulation/Gait                   Stairs             Wheelchair Mobility     Tilt Bed    Modified Rankin (Stroke Patients Only)       Balance Overall balance assessment: Needs assistance Sitting-balance support: Feet supported Sitting balance-Leahy Scale: Poor Sitting balance - Comments: Frequent min A to prevent LOB                                    Cognition Arousal: Alert Behavior During Therapy: Flat affect Overall Cognitive Status: No family/caregiver present to determine baseline cognitive functioning                                 General Comments: Extra time and cuing to follow one step commands        Exercises Total Joint Exercises Heel Slides: AAROM, Strengthening,  Both, 10 reps Long Arc Quad: AAROM, Strengthening, Both, 10 reps, 15 reps Knee Flexion: AAROM, Strengthening, Both, 10 reps, 15 reps Other Exercises Other Exercises: Static sitting balance training with weight shifting in various planes for improved core strength, activity tolerance, and sitting balance    General Comments        Pertinent Vitals/Pain Pain Assessment Pain Assessment: No/denies pain    Home Living                          Prior Function            PT Goals (current goals can now be found in the care plan section) Progress towards PT goals: Not progressing toward goals - comment (limited by weakness and BLE knee flex contractures)     Frequency    Min 1X/week      PT Plan      Co-evaluation              AM-PAC PT "6 Clicks" Mobility   Outcome Measure  Help needed turning from your back to your side while in a flat bed without using bedrails?: A Lot Help needed moving from lying on your back to sitting on the side of a flat bed without using bedrails?: Total Help needed moving to and from a bed to a chair (including a wheelchair)?: Total Help needed standing up from a chair using your arms (e.g., wheelchair or bedside chair)?: Total Help needed to walk in hospital room?: Total Help needed climbing 3-5 steps with a railing? : Total 6 Click Score: 7    End of Session   Activity Tolerance: Patient tolerated treatment well Patient left: in bed;with call bell/phone within reach;with bed alarm set Nurse Communication: Mobility status PT Visit Diagnosis: Muscle weakness (generalized) (M62.81)     Time: 4098-1191 PT Time Calculation (min) (ACUTE ONLY): 26 min  Charges:    $Therapeutic Exercise: 8-22 mins $Therapeutic Activity: 8-22 mins PT General Charges $$ ACUTE PT VISIT: 1 Visit                     D. Elly Modena PT, DPT 02/02/24, 3:28 PM

## 2024-02-02 NOTE — Progress Notes (Signed)
Speech Language Pathology Treatment: Dysphagia  Patient Details Name: Jon Smith MRN: [id number] DOB: [birth year] Today's Date: 02/02/2024 Time: 7425-9563 SLP Time Calculation (min) (ACUTE ONLY): 30 min  Assessment / Plan / Recommendation Clinical Impression  Pt seen at bedside for reassessment of swallow function and readiness to begin PO intake. Pt was sleeping upon arrival of SLP, with wheezy breath sounds noted. Pt awakened easily with gentle verbal and tactile stim.   Visualization of oral cavity revealed it to be significantly dry and cracked, with dried blood/secretions throughout oral cavity. Voice quality was noted to be thick and wet, raising concern for standing secretions within the pharynx and airway. Suction was set up at bedside to facilitate thorough oral care. Pt tolerated gentle (given state of oral cavity) oral care well, but exhibited several instances of gagging. Pieces of dried blood/secretions were removed from oral cavity, however, continued regular thorough oral care is needed to remove additional dried blood/secretions. Pt tolerated deeper oral suction with removal of some thick secretions. This was effective to improve voice quality so that it was more clear. Pt continues to have dried blood/secretions as well as fresh bleeding following oral care. RN aware.  At this time, pt continues unsafe for PO intake, as he is currently unable to manage his own secretions. Pt's son was receptive to this education, and asked how pt would get nutrition if he was unable to eat/drink. Recommend consideration of non-oral feeding method if within pt/family wishes. Palliative Care consulted. MD/RN notified.  SLP will continue to follow to assess readiness for PO intake. Recommend strict NPO with oral care 4x/day.   HPI HPI: Jon Smith is a 89 y.o. male with medical history significant of CKD stage IIIb, hyperlipidemia, hypertension, peripheral artery disease, who lives in a nursing  home brought in on 01/30/2024 with concerns of worsening pneumonia as seen on an x-ray obtained in the facility in the setting of persistent cough.  Patient was recently admitted here last month and managed for dehydration with AKI.  He is unable to provide subjective information on account of dementia. CT scan of the brain did not show acute intracranial pathology, chest x-ray showed findings of bibasilar infiltrate.   04/12/2023 BSE - recommended dysphagia 3 diet with thin liquids, medicine whole with puree      SLP Plan  Continue with current plan of care      Recommendations for follow up therapy are one component of a multi-disciplinary discharge planning process, led by the attending physician.  Recommendations may be updated based on patient status, additional functional criteria and insurance authorization.    Recommendations  Diet recommendations: NPO Medication Administration: Via alternative means                  Oral care QID;Staff/trained caregiver to provide oral care   Frequent or constant Supervision/Assistance Dysphagia, oropharyngeal phase (R13.12)     Continue with current plan of care    Jon Smith B. Murvin Natal, [facility], CCC-SLP Speech Language Pathologist  Jon Smith 02/02/2024, 11:54 AM

## 2024-02-02 NOTE — Care Management Important Message (Signed)
Important Message  Patient Details  Name: Jon Smith MRN: [id number] Date of Birth: Nov 19, 1928   Important Message Given:  Yes - Medicare IM     Shahd Occhipinti W, CMA 02/02/2024, 10:09 AM

## 2024-02-02 NOTE — Progress Notes (Signed)
     Referral received for Jon Smith re: goals of care discussion. Chart reviewed. Patient unable to engage appropriately in GOC or medical decision making independently at this time.   I spoke with patient's son [name] over the phone. [name] would like to meet in person with his cousins as well as patient present.   GOC meeting scheduled for tomorrow 2/4 at 10am. [name] is aware that we will meet at patient's bedside.   Detailed note and recommendations to follow once GOC has been completed.  PMT contact info given to [place].    Thank you for your referral and allowing PMT to assist in [place]'s care.   Georgiann Cocker, FNP-BC Palliative Medicine Team   NO CHARGE

## 2024-02-02 NOTE — Plan of Care (Signed)
  Problem: Elimination: Goal: Will not experience complications related to bowel motility Outcome: Progressing   Problem: Pain Managment: Goal: General experience of comfort will improve and/or be controlled Outcome: Progressing   Problem: Skin Integrity: Goal: Risk for impaired skin integrity will decrease Outcome: Progressing

## 2024-02-02 NOTE — Consult Note (Signed)
WOC Nurse Consult Note: Reason for Consult: sacrum/B heels  Wound type: Deep Tissue Pressure Injuries; R heel has evolved to an unstageable PI  Pressure Injury POA: Yes Measurement: 1.  L heel DTPI 3 cm x 5 cm black eschar that is evolving at this time  2.  L medial knee 4 cm x 1 cm Deep Tissue Pressure Injury purple maroon discoloration skin intact  3.  R heel DTPI that has evolved to unstageable 3 cm x 4 cm 80% brown tan necrotic 10% black with pink edges  4.  R dorsal foot 5 cm x 1 cm DTPI 100% purple maroon discoloration skin intact  5.  Sacrum 4 cm x 4 cm area of evolving DTPI soft purple eschar  Wound bed: as above  Drainage (amount, consistency, odor) minimal tan exudate to R heel, serosanguinous to L heel and sacrum  Periwound:peeling skin sacrum  Dressing procedure/placement/frequency:  Clean sacrum and L heel with Vashe wound cleanser Hart [place] [phone]), apply Xeroform gauze daily to wound beds. Cover Sacrum with silicone foam. Wrap L heel with dry gauze and Kerlix roll gauze.  Apply Xeroform gauze Hart [place] [phone]) to L medial knee and R dorsal foot purple maroon discoloration, cover with silicone foam. Lift foam daily to assess. Change foam q3 days and prn soiling.  Cleanse R heel with Vashe wound cleanser Hart [place] [phone]), apply Medihoney to wound bed daily, cover with dry gauze and Kerlix roll gauze.  Both feel should be placed in Prevalon boots to offload pressure Hart [place] [phone]).   Patient would benefit from a low air loss mattress for pressure redistribution and moisture management.  Also pillow between knees to offload pressure there.    POC discussed with bedside nurse and primary MD. WOC team will not follow.  DTPIs are high risk for deterioration. If sacral wound develops brown/yellow/tan necrotic tissue please reconsult.   Thank you,    Priscella Mann MSN, RN-BC, CWOCN [phone]  :

## 2024-02-03 DIAGNOSIS — F03B0 Unspecified dementia, moderate, without behavioral disturbance, psychotic disturbance, mood disturbance, and anxiety: Secondary | ICD-10-CM | POA: Diagnosis not present

## 2024-02-03 DIAGNOSIS — Z515 Encounter for palliative care: Secondary | ICD-10-CM | POA: Diagnosis not present

## 2024-02-03 DIAGNOSIS — J189 Pneumonia, unspecified organism: Secondary | ICD-10-CM | POA: Diagnosis not present

## 2024-02-03 DIAGNOSIS — N179 Acute kidney failure, unspecified: Secondary | ICD-10-CM | POA: Diagnosis not present

## 2024-02-03 DIAGNOSIS — N189 Chronic kidney disease, unspecified: Secondary | ICD-10-CM | POA: Diagnosis not present

## 2024-02-03 LAB — CBC WITH DIFFERENTIAL/PLATELET
Abs Immature Granulocytes: 0.08 10*3/uL — ABNORMAL HIGH (ref 0.00–0.07)
Basophils Absolute: 0 10*3/uL (ref 0.0–0.1)
Basophils Relative: 0 %
Eosinophils Absolute: 0.1 10*3/uL (ref 0.0–0.5)
Eosinophils Relative: 1 %
HCT: 24.4 % — ABNORMAL LOW (ref 39.0–52.0)
Hemoglobin: 7.9 g/dL — ABNORMAL LOW (ref 13.0–17.0)
Immature Granulocytes: 1 %
Lymphocytes Relative: 8 %
Lymphs Abs: 0.9 10*3/uL (ref 0.7–4.0)
MCH: 27.7 pg (ref 26.0–34.0)
MCHC: 32.4 g/dL (ref 30.0–36.0)
MCV: 85.6 fL (ref 80.0–100.0)
Monocytes Absolute: 1 10*3/uL (ref 0.1–1.0)
Monocytes Relative: 9 %
Neutro Abs: 9.3 10*3/uL — ABNORMAL HIGH (ref 1.7–7.7)
Neutrophils Relative %: 81 %
Platelets: 440 10*3/uL — ABNORMAL HIGH (ref 150–400)
RBC: 2.85 MIL/uL — ABNORMAL LOW (ref 4.22–5.81)
RDW: 18.2 % — ABNORMAL HIGH (ref 11.5–15.5)
WBC: 11.4 10*3/uL — ABNORMAL HIGH (ref 4.0–10.5)
nRBC: 0 % (ref 0.0–0.2)

## 2024-02-03 LAB — BASIC METABOLIC PANEL
Anion gap: 12 (ref 5–15)
BUN: 81 mg/dL — ABNORMAL HIGH (ref 8–23)
CO2: 16 mmol/L — ABNORMAL LOW (ref 22–32)
Calcium: 8.6 mg/dL — ABNORMAL LOW (ref 8.9–10.3)
Chloride: 130 mmol/L — ABNORMAL HIGH (ref 98–111)
Creatinine, Ser: 2.8 mg/dL — ABNORMAL HIGH (ref 0.61–1.24)
GFR, Estimated: 20 mL/min — ABNORMAL LOW (ref >60)
Glucose, Bld: 143 mg/dL — ABNORMAL HIGH (ref 70–99)
Potassium: 4.9 mmol/L (ref 3.5–5.1)
Sodium: 158 mmol/L — ABNORMAL HIGH (ref 135–145)

## 2024-02-03 MED ORDER — VANCOMYCIN HCL 500 MG/100ML IV SOLN
500.0000 mg | Freq: Once | INTRAVENOUS | Status: AC
  Administered 2024-02-03: 500 mg via INTRAVENOUS
  Filled 2024-02-03: qty 100

## 2024-02-03 NOTE — TOC Initial Note (Signed)
 Transition of Care [facility]) - Initial/Assessment Note    Patient Details  Name: Jon Smith MRN: [id number] Date of Birth: Mar 08, 1928  Transition of Care [facility]) CM/SW Contact:    Ladene Lady, LCSW Phone Number: 02/03/2024, 8:48 AM  Clinical Narrative:  Pt admitted from [facility] per tammy. CSW following for care plan updates and needs.                 Expected Discharge Plan: Skilled Nursing Facility Barriers to Discharge: Continued Medical Work up   Patient Goals and CMS Choice Patient states their goals for this hospitalization and ongoing recovery are:: [facility] to [facility]   Choice offered to / list presented to : Patient      Expected Discharge Plan and Services                                              Prior Living Arrangements/Services   Lives with:: Facility Resident Patient language and need for interpreter reviewed:: Yes Do you feel safe going back to the place where you live?: Yes               Activities of Daily Living   ADL Screening (condition at time of admission) Independently performs ADLs?: No Does the patient have a NEW difficulty with bathing/dressing/toileting/self-feeding that is expected to last >3 days?: Yes (Initiates electronic notice to provider for possible OT consult) Does the patient have a NEW difficulty with getting in/out of bed, walking, or climbing stairs that is expected to last >3 days?: Yes (Initiates electronic notice to provider for possible PT consult) Does the patient have a NEW difficulty with communication that is expected to last >3 days?: Yes (Initiates electronic notice to provider for possible SLP consult) Is the patient deaf or have difficulty hearing?: Yes Does the patient have difficulty seeing, even when wearing glasses/contacts?: Yes Does the patient have difficulty concentrating, remembering, or making decisions?: Yes  Permission Sought/Granted                  Emotional Assessment        Orientation: : Fluctuating Orientation (Suspected and/or reported Sundowners)      Admission diagnosis:  Bilateral pneumonia [J18.9] Pneumonia due to infectious organism, unspecified laterality, unspecified part of lung [J18.9] Acute renal failure superimposed on chronic kidney disease, unspecified acute renal failure type, unspecified CKD stage (HCC) [N17.9, N18.9] Patient Active Problem List   Diagnosis Date Noted   Bilateral pneumonia 01/30/2024   Dehydration 12/09/2023   Lung nodule 12/09/2023   AKI (acute kidney injury) (HCC) 12/05/2023   Dementia (HCC) 12/05/2023   Pressure injury of skin 12/05/2023   Acute on chronic anemia 11/25/2023   CKD stage 3b, GFR 30-44 ml/min (HCC) 11/25/2023   Depression 11/25/2023   UTI (urinary tract infection) 04/11/2023   Altered mental status 04/11/2023   Generalized weakness 04/11/2023   BPH (benign prostatic hyperplasia) 04/11/2023   CVA (cerebral vascular accident) (HCC) 04/11/2023   Dysphagia 04/11/2023   Renovascular hypertension 10/13/2020   Disease characterized by destruction of skeletal muscle 07/25/2020   Elevated AST (SGOT) 07/25/2020   Fall 07/25/2020   Hyperkalemia 07/25/2020   Urinary retention 07/25/2020   Vitamin B12 deficiency 08/20/2019   Generalized muscle weakness 08/18/2019   Asymptomatic proteinuria 02/22/2019   Vitamin D deficiency 02/22/2019   Chronic anemia 02/21/2019   High risk medication  use 02/21/2019   Scalp hematoma 04/28/2018   Urine test positive for microalbuminuria 08/27/2017   Neck pain 07/01/2017   DDD (degenerative disc disease), cervical 07/01/2017   Prostate cancer (HCC) 01/16/2016   Elevated PSA 01/16/2016   Protein-calorie malnutrition, severe 12/12/2015   Gross hematuria    Acute hepatitis 12/08/2015   Elevated LFTs    Abnormal liver function tests 12/06/2015   H/O malignant neoplasm of prostate 09/18/2015   Hypercholesteremia 05/17/2014   Essential hypertension 05/17/2014    Photodermatitis due to sun 05/17/2014   PCP:  Lyle Avelina LABOR, NP Pharmacy:   [facility] [place], [place] - [address] [place] [address] Phone: [phone] Fax: [phone]     Social Drivers of Health (SDOH) Social History: SDOH Screenings   Food Insecurity: Patient Unable To Answer (01/31/2024)  Housing: Patient Unable To Answer (01/31/2024)  Transportation Needs: Patient Unable To Answer (01/31/2024)  Utilities: Patient Unable To Answer (01/31/2024)  Financial Resource Strain: High Risk (07/25/2020)   Received from [facility]  Social Connections: Unknown (01/31/2024)  Tobacco Use: Medium Risk (01/31/2024)   SDOH Interventions:     Readmission Risk Interventions     No data to display

## 2024-02-03 NOTE — Consult Note (Signed)
 Consultation Note Date: 02/03/2024 at 1000  Patient Name: Jon Smith  DOB: Apr 03, 1928  MRN: [id number]  Age / Sex: 89 y.o., male  PCP: Lyle Avelina LABOR, NP Referring Physician: Dorinda Drue DASEN, MD  HPI/Patient Profile: 89 y.o. male  with past medical history of CKD (3B), HLD, HTN, PAD, and dementia admitted [facility]TC [facility] on his 89[facility] 01/30/2024 with concerns of worsening pneumonia seen on his x-ray in his facility.  Patient has also had persistent cough.  Patient is being treated for sepsis secondary to bilateral pneumonia, hypernatremia due to decreased oral intake/dehydration, and AKI on CKD.   Clinical Assessment and Goals of Care: Extensive chart review completed prior to meeting patient including labs, vital signs, imaging, progress notes, orders, and available advanced directive documents from current and previous encounters. I then met with patient at bedside and then with his son and cousin in separate meeting space (PNAP office) to discuss diagnosis prognosis, GOC, EOL wishes, disposition and options.  Patient is awake, alert, but makes no vocalizations during my visit.  He has no acute signs of pain or discomfort at this time.  Patient is unable to participate in goals of care medical decision making independently at this time.  After assessing the patient, met with his son Sudie and his cousin Erminio outside of patient's room at family's request.  I introduced Palliative Medicine as specialized medical care for people living with serious illness. It focuses on providing relief from the symptoms and stress of a serious illness. The goal is to improve quality of life for both the patient and the family.  We discussed a brief life review of the patient. He is widowed and has four children, 2 daughters from whom he is estranged and two sons - Sudie and Bynum. As per Sudie Pin is addicted to drugs and unable to be a reasonable and reliable source of support for the patient. Sudie is patient's [facility] psychiatrist.   As far as functional and nutritional status Sudie endorses he is seen a significant decline in his fathers mobility and mentation over the past year.  He shares patient has been sleeping more, not able to get out of bed and transfer to cars like he wants code, and has not been as interested in food/eating.  Discussed patient's 3 inpatient visits in 2 ED visits over the last 6 months. Counseled with family on dementia as a chronic, progressive, and irreversible disease that is often exacerbated and accelerated by acute hospitalizations and illnesses.  Discussed with family that hospitalizations have not in fact improved patient's quality of life.  They endorse that they have seen a significant decline in patient with every hospitalization despite maximizing medical efforts.  We discussed patient's current illness-bilateral pneumonia and hypernatremia-and what it means in the larger context of patient's on-going co-morbidities-dementia, poor functional status, and cyclical nature of multiple hospitalizations over the last year.  Reviewed that while patient is being optimized with IV fluids and antibiotics, this is not sustainable outside of  the hospital.  Patient will likely continue to have poor p.o. intake, become dehydrated, and either necessitate IV fluid support artificially or except that this is the natural disease trajectory.  Natural disease trajectory and expectations at EOL were discussed.  Discussed that patients with advanced dementia often times sleep more, do not eat as much, turn inward, or less interactive, and are not as able/willing to be mobile.  Family endorses patient has been speaking of deceased relatives from the past, something he has never done previously.  Discussed that visiting is an anticipated part of the patient's transition  towards end-of-life.  I also highlighted that sleeping more often is also common with patients as they begin their transition to end-of-life.  I attempted to elicit values and goals of care important to the patient.  Family shares that they do not want patient to suffer or be in pain.  They except that his dementia has progressed.  They also recognize that despite optimal medical efforts, patient's sodium remains high, his functional status remains low, and his dementia advances.    The difference between aggressive medical intervention and comfort care was considered in light of the patient's goals of care.   Advance directives, concepts specific to code status, artificial feeding and hydration, and rehospitalization were considered and discussed.  Comfort care discussed in detail.  Family is not prepared to shift to comfort care at this time.  They remain in agreement with DNR with limited interventions.  Family wishes for patient to be evaluated for hospice services to follow.  Hospice services, aging in place, hospice philosophy, and hospice at a facility versus hospice inpatient unit discussed in detail.  Family would like patient to be evaluated for hospice inpatient unit.  MD, TOC, RN, and hospice liaison made aware of above discussion.  [place] with a [facility] care evaluated patient and he is not appropriate for hospice inpatient unit at this time.  TOC made aware that family would like to pursue [facility] with hospice services to follow now that they are aware that patient cannot be accepted to [facility].  No change to plan of care at this time.  Goal remains to discharge to [facility] with hospice services if/when possible.   Primary Decision Maker NEXT OF KIN  Physical Exam Vitals reviewed.  Constitutional:      Comments: Thin, frail  HENT:     Head: Normocephalic.     Mouth/Throat:     Mouth: Mucous membranes are moist.  Eyes:     Pupils: Pupils are equal, round, and  reactive to light.  Cardiovascular:     Pulses: Normal pulses.  Pulmonary:     Effort: Pulmonary effort is normal.  Abdominal:     Palpations: Abdomen is soft.  Musculoskeletal:     Comments: Generalized weakness, does not MAETC  Skin:    General: Skin is warm and dry.  Neurological:     Mental Status: He is alert.  Psychiatric:        Mood and Affect: Mood normal.        Behavior: Behavior normal.     Palliative Assessment/Data: 30%     Thank you for this consult. Palliative medicine will continue to follow and assist holistically.   Time Total: 120 minutes  Time spent includes: Detailed review of medical records (labs, imaging, vital signs), medically appropriate exam (mental status, respiratory, cardiac, skin), discussed with treatment team, counseling and educating patient, family and staff, documenting clinical information, medication management and  coordination of care.  Signed by: Lamarr Gunner, DNP, FNP-BC Palliative Medicine   Please contact Palliative Medicine Team providers via [facility] for questions and concerns.

## 2024-02-03 NOTE — Progress Notes (Signed)
 Progress Note   Patient: Jon Smith FMW:[id number] DOB: 13-Dec-1928 DOA: 01/30/2024     4 DOS: the patient was seen and examined on 02/03/2024    Brief hospital course: From HPI Jon Smith is a 89 y.o. male with medical history significant of CKD stage IIIb, hyperlipidemia, hypertension, peripheral artery disease, who lives in a nursing home brought in with concerns of worsening pneumonia as seen on an x-ray obtained in the facility in the setting of persistent cough.  Patient was recently admitted here last month and managed for dehydration with AKI.  He is unable to provide subjective information on account of dementia.  History obtained from chart review as well as discussing with patient's son over the phone.  He denied nausea vomiting chest pain diarrhea or urinary complaints.   Upon arrival to the emergency room patient had temperature 97.7, blood pressure 115/49 respiratory rate 25 pulse 81.  CT scan of the brain did not show acute intracranial pathology, chest x-ray showed findings of bibasilar infiltrate.  Hospitalist service was contacted to admit patient for further management.       Assessment and Plan:    Sepsis secondary to bilateral pneumonia Patient presented with WBC 19, respiratory rate 25 in the setting of imaging showing bilateral pneumonia Respiratory panel is negative Given recent hospitalization patient is being covered with Vanco and cefepime  MRSA in the nares positive, placed on mupirocin  We will de-escalate antibiotics based on culture results Lactic acid 1.1 procalcitonin 36   Hypernatremia likely secondary to decreased oral intake Continue D5W Monitor closely   AKI on CKD 3B due to dehydration-improved Acute on chronic metabolic acidosis Patient presenting with bicarb of 16 and creatinine elevated from baseline up to 5 Clinically dehydrated Continue IV fluid Monitor renal function Nephrologist on board and case discussed    Aspiration event on  speech therapist evaluation Currently n.p.o. Speech therapist following Palliative care on board and patient's family do not want any feeding tube at this time  Acute on chronic anemia Chronic iron  deficiency Folic acid  deficiency Continue vitamin B12, folate acid as well as iron  supplementation Monitor CBC closely No indication for transfusion at this time   Hypernatremia secondary to dehydration Continue IV fluid therapy Monitor sodium levels   Dementia Placed on delirium precaution   Essential hypertension Unable to give antihypertensives on account of n.p.o. We will give as needed IV hydralazine    Generalized weakness Continue PT OT   BPH Tamsulosin  on hold due to n.p.o. status   Hyperlipidemia Holding statins as patient is n.p.o.   Prostate CA status post treatment In remission   Dementia Resume Zoloft  when no longer n.p.o.   Lung nodule CT scan C-spine showed 2.5 mm posterior left apical noncalcified lung nodule, stable in size and appearance when compared to recent CT chest in April 2024 Outpatient follow-up     DVT prophylaxis: Heparin    Disposition: Back to skilled nursing facility when medically stable    Advance Care Planning:   Code Status: Prior DNI DNR   Consults: Nephrology on board  Family Communication: Patient's son     Subjective:  Patient denies chest pain or increased work of breathing Denies nausea vomiting   Physical Exam:   General: Elderly male laying in bed in no acute distress HEENT: Atraumatic normocephalic Lungs: Decreased air entry bilaterally Abdomen: Soft nontender CNS: Awake very hard at hearing having generalized extremity weakness     Data Reviewed:      Latest Ref Rng & Units 02/03/2024  4:54 AM 02/02/2024    5:36 AM 02/01/2024    5:29 AM  BMP  Glucose 70 - 99 mg/dL 856  72  75   BUN 8 - 23 mg/dL 81  88  95   Creatinine 0.61 - 1.24 mg/dL 7.19  6.68  6.33   Sodium 135 - 145 mmol/L 158  155  151   Potassium  3.5 - 5.1 mmol/L 4.9  4.9  4.4   Chloride 98 - 111 mmol/L 130  128  126   CO2 22 - 32 mmol/L 16  12  14    Calcium  8.9 - 10.3 mg/dL 8.6  8.5  8.2      Vitals:   02/02/24 1141 02/02/24 1541 02/02/24 2153 02/03/24 0424  BP: (!) 142/69 129/74 (!) 163/72 (!) 151/64  Pulse: (!) 108 (!) 103 95 92  Resp:  20 18 20   Temp: 97.8 F (36.6 C) 98 F (36.7 C) (!) 97.5 F (36.4 C) 98.1 F (36.7 C)  TempSrc: Axillary Axillary Axillary   SpO2: 98% 98% 100% 100%  Weight:          Latest Ref Rng & Units 02/03/2024    4:54 AM 02/02/2024    5:36 AM 02/01/2024    5:29 AM  CBC  WBC 4.0 - 10.5 K/uL 11.4  12.9  11.3   Hemoglobin 13.0 - 17.0 g/dL 7.9  8.0  7.1   Hematocrit 39.0 - 52.0 % 24.4  25.3  21.8   Platelets 150 - 400 K/uL 440  474  422      Author: Drue ONEIDA Potter, MD 02/03/2024 4:33 PM  For on call review [email or web address].

## 2024-02-03 NOTE — Progress Notes (Signed)
 [facility] Liaison Note  Received a referral from [facility], Ladene Lady, LCSW, to discuss Hospice services with son.  Mr. Jon Smith was not in the patient's room, so a voice mail message was left with him to call HL back.  Awaiting a return phone call from the son.  Patient is not appropriate for the Hospice Home at this time. [facility] can follow patient at [facility].  Hospital team aware.    Please don't hesitate to call with any Hospice related questions or concerns.    Thank you for the opportunity to participate in this patient's care.  [facility] Liaison [phone]

## 2024-02-03 NOTE — Progress Notes (Signed)
 [facility] Liaison Note   Spoke with patient's son and explained that patient was not appropriate for the [facility] at this time.  He stated that he wanted patient to discharge to [facility] with Hospice following.  Informed the son that patient would be followed by [facility] if patient went to [facility].  Hospital team aware.    Thank you for the opportunity to participate in this patient's care.    [facility] Liaison [phone]

## 2024-02-03 NOTE — Progress Notes (Signed)
 OT Cancellation Note  Patient Details Name: Jon Smith MRN: [id number] DOB: 1928-12-22   Cancelled Treatment:    Reason Eval/Treat Not Completed: Patient declined, no reason specified. Chart reviewed. On arrival pt sleeping soundly, awakes to name and deep pressure. Pt defers mobility or ADL attempts and returns to sleep. Will continue to follow POC at later date/time as pt available.   Dewayne Severe, M.S. OTR/L  02/03/24, 3:00 PM  ascom [phone]

## 2024-02-03 NOTE — TOC Progression Note (Signed)
 Transition of Care [facility]) - Progression Note    Patient Details  Name: Jon Smith MRN: [id number] Date of Birth: December 30, 1928  Transition of Care [facility]) CM/SW Contact  Ladene Lady, LCSW Phone Number: 02/03/2024, 12:05 PM  Clinical Narrative:   Per pallative, pt would like to be evaluated for hospice house, referral given to jack with [facility].    Expected Discharge Plan: Skilled Nursing Facility Barriers to Discharge: Continued Medical Work up  Expected Discharge Plan and Services                                               Social Determinants of Health (SDOH) Interventions SDOH Screenings   Food Insecurity: Patient Unable To Answer (01/31/2024)  Housing: Patient Unable To Answer (01/31/2024)  Transportation Needs: Patient Unable To Answer (01/31/2024)  Utilities: Patient Unable To Answer (01/31/2024)  Financial Resource Strain: High Risk (07/25/2020)   Received from [facility], [facility]  Social Connections: Unknown (01/31/2024)  Tobacco Use: Medium Risk (01/31/2024)    Readmission Risk Interventions    02/03/2024    8:48 AM  Readmission Risk Prevention Plan  Transportation Screening Complete  Medication Review (RN Care Manager) Complete  HRI or Home Care Consult Complete  Palliative Care Screening Complete  Skilled Nursing Facility Complete

## 2024-02-03 NOTE — Consult Note (Signed)
 Pharmacy Antibiotic Note  ASSESSMENT: 89 y.o. male with PMH including CKD3b, HLD, HTN, PAD is presenting with pneumonia / HCAP. Patient is in AKI. CXR shows bilateral PNA. MRSA nares positive. He has leukocytosis but is VSS and on RA, Pharmacy has been consulted to manage cefepime  and vancomycin  dosing.  Assessment: Day 5 of antibiotics  Vancomycin  750 mg IV x 1 given on 2/2 @ 2152 Vanc rm 2/3 @ 2115: 18 Patient remains in AKI but is improving (baseline Scr 1.5-1.8, current Scr 2.8)  Patient measurements: Weight: 61.7 kg (136 lb)  Vital signs: Temp: 98.1 F (36.7 C) (02/04 0424) Temp Source: Axillary (02/03 2153) BP: 151/64 (02/04 0424) Pulse Rate: 92 (02/04 0424) Recent Labs  Lab 02/01/24 0529 02/02/24 0536 02/03/24 0454  WBC 11.3* 12.9* 11.4*  CREATININE 3.66* 3.31* 2.80*   Estimated Creatinine Clearance: 14.1 mL/min (A) (by C-G formula based on SCr of 2.8 mg/dL (H)).  Allergies: Allergies  Allergen Reactions   Aspirin  Nausea And Vomiting   Sulfamethoxazole -Trimethoprim      Other reaction(s): Blood Disorder Coagulopathy    Antimicrobials this admission: Cefepime  1/31 >> Vancomycin  1/31 >>  Dose adjustments this admission: N/A  Microbiology results: 1/31 BCx: NGTD 1/31 Flu/Covid/RSV: negative 2/1 MRSA PCR: positive  PLAN: Continue cefepime  1 g IV q24H Continue to dose vancomycin  by level given current AKI Give Vancomycin  500 mg IV x 1  Will order vanc random for 2/5 @ 0900 Follow up culture results to assess for antibiotic optimization. Monitor renal function to assess for any necessary antibiotic dosing changes.   Thank you for allowing pharmacy to be a part of this patient's care.  Lum Mania, PharmD Clinical Pharmacist 02/03/2024 8:44 AM

## 2024-02-03 NOTE — Progress Notes (Addendum)
 [facility]  PROGRESS NOTE   Subjective:   Patient seen resting in bed No family present Eyes open for short period  Sodium 158  Objective:  Vital signs: Blood pressure (!) 151/64, pulse 92, temperature 98.1 F (36.7 C), resp. rate 20, weight 61.7 kg, SpO2 100%.  Intake/Output Summary (Last 24 hours) at 02/03/2024 1210 Last data filed at 02/03/2024 0544 Gross per 24 hour  Intake 1000 ml  Output 700 ml  Net 300 ml   Filed Weights   01/30/24 1217  Weight: 61.7 kg     Physical Exam: General:  No acute distress  Head: Cachectic  Eyes:  Anicteric  Lungs:   Clear to auscultation, normal effort  Heart:  S1S2 no rubs  Abdomen:   Soft, nontender, bowel sounds present  Extremities: No peripheral edema.  Neurologic:  Awake, alert, following commands  Skin:  No lesions  Access: None    Basic Metabolic Panel: Recent Labs  Lab 01/30/24 1223 01/31/24 0458 02/01/24 0529 02/02/24 0536 02/03/24 0454  NA 149* 150* 151* 155* 158*  K 5.0 4.7 4.4 4.9 4.9  CL 115* 122* 126* 128* 130*  CO2 16* 13* 14* 12* 16*  GLUCOSE 86 78 75 72 143*  BUN 110* 106* 95* 88* 81*  CREATININE 5.16* 4.37* 3.66* 3.31* 2.80*  CALCIUM  8.5* 8.2* 8.2* 8.5* 8.6*   GFR: Estimated Creatinine Clearance: 14.1 mL/min (A) (by C-G formula based on SCr of 2.8 mg/dL (H)).  Liver Function Tests: Recent Labs  Lab 01/30/24 1223  AST 43*  ALT 26  ALKPHOS 73  BILITOT 0.9  PROT 7.0  ALBUMIN 2.6*   No results for input(s): LIPASE, AMYLASE in the last 168 hours. No results for input(s): AMMONIA in the last 168 hours.  CBC: Recent Labs  Lab 01/30/24 1223 01/31/24 0458 02/01/24 0529 02/02/24 0536 02/03/24 0454  WBC 19.4* 14.1* 11.3* 12.9* 11.4*  NEUTROABS 17.2* 12.0* 9.1* 10.4* 9.3*  HGB 8.3* 7.4* 7.1* 8.0* 7.9*  HCT 25.8* 23.5* 21.8* 25.3* 24.4*  MCV 86.6 87.7 84.5 85.5 85.6  PLT 512* 436* 422* 474* 440*     HbA1C: Hgb A1c MFr Bld  Date/Time Value Ref Range Status   12/05/2023 05:56 AM 5.4 4.8 - 5.6 % Final    Comment:    (NOTE) Pre diabetes:          5.7%-6.4%  Diabetes:              >6.4%  Glycemic control for   <7.0% adults with diabetes     Urinalysis: No results for input(s): COLORURINE, LABSPEC, PHURINE, GLUCOSEU, HGBUR, BILIRUBINUR, KETONESUR, PROTEINUR, UROBILINOGEN, NITRITE, LEUKOCYTESUR in the last 72 hours.  Invalid input(s): APPERANCEUR    Imaging: No results found.    Medications:    ceFEPime  (MAXIPIME ) IV 1 g (02/02/24 1745)   dextrose  125 mL/hr at 02/03/24 1016    atorvastatin   40 mg Oral Daily   clopidogrel   75 mg Oral Daily   cyanocobalamin   1,000 mcg Oral Daily   feeding supplement  237 mL Oral BID BM   ferrous sulfate   325 mg Oral Q breakfast   finasteride   5 mg Oral Daily   folic acid   1 mg Oral Daily   heparin  injection (subcutaneous)  5,000 Units Subcutaneous Q8H   leptospermum manuka honey  1 Application Topical Daily   mupirocin  ointment   Nasal BID   sertraline   50 mg Oral Daily   sodium bicarbonate   1,300 mg Oral TID   tamsulosin   0.4 mg Oral Daily   vancomycin  variable dose per unstable renal function (pharmacist dosing)   Does not apply See admin instructions    Assessment/ Plan:     89 y.o. male with a PMHx of hypertension, hyperlipidemia, peripheral vascular disease, chronic kidney disease who is a long-term nursing home patient now comes in with persistent cough.  He is being treated for pneumonia in the emergency room.  He is now found to have acute kidney injury.  He has been on lisinopril  at 40 mg daily.  He has a baseline creatinine of about 1.7.  He had a renal sonogram last month which did not show any obstruction.   Principal Problem:   Bilateral pneumonia   #1: Acute kidney injury on chronic kidney disease: Patient has CKD stage IIIb with a baseline creatinine of 1.7.  Patient now has acute kidney injury which is most likely secondary to prerenal azotemia due to  decreased p.o. intake complicated by ATN secondary to sepsis.      Creatinine continues to improve.   #2:  Acute metabolic acidosis: Metabolic acidosis can be secondary to sepsis complicated by acute kidney injury.  Serum bicarb 16, continue oral supplementation.   #3: Sepsis: Prescribed vancomycin  and cefepime .    #4: Hypernatremia: Possibly secondary to decreased free water intake.  Sodium continues to increase, 158.  IVF increased to D5 125ml/hr  Palliative care has been consulted and will meet with family to discuss goals of care.    LOS: 4 [facility] 2/4/202512:10 PM

## 2024-02-03 NOTE — Progress Notes (Signed)
Patient refused to have evening vitals taken. Made MD aware.

## 2024-02-03 NOTE — Progress Notes (Signed)
 SLP Cancellation Note  Patient Details Name: Jon Smith MRN: [id number] DOB: 09-17-1928   Cancelled treatment:       Reason Eval/Treat Not Completed: Fatigue/lethargy limiting ability to participate  Pt able to briefly open his eyes to verbal tactile stim but is unable to maintain arousal for trials of POs at this time.   Continue to recommend oral care QID, consideration of comfort feeding should pt request POs and re-consult ST services when pt is able to manage his own secretions as well as sustain attention to skilled therapy and trials of POs.    Wallie Lagrand 02/03/2024, 2:06 PM

## 2024-02-04 DIAGNOSIS — N189 Chronic kidney disease, unspecified: Secondary | ICD-10-CM | POA: Diagnosis not present

## 2024-02-04 DIAGNOSIS — J189 Pneumonia, unspecified organism: Secondary | ICD-10-CM | POA: Diagnosis not present

## 2024-02-04 DIAGNOSIS — Z515 Encounter for palliative care: Secondary | ICD-10-CM | POA: Diagnosis not present

## 2024-02-04 DIAGNOSIS — N179 Acute kidney failure, unspecified: Secondary | ICD-10-CM | POA: Diagnosis not present

## 2024-02-04 LAB — CULTURE, BLOOD (ROUTINE X 2)
Culture: NO GROWTH
Culture: NO GROWTH
Special Requests: ADEQUATE

## 2024-02-04 LAB — CBC WITH DIFFERENTIAL/PLATELET
Abs Immature Granulocytes: 0.12 10*3/uL — ABNORMAL HIGH (ref 0.00–0.07)
Basophils Absolute: 0 10*3/uL (ref 0.0–0.1)
Basophils Relative: 0 %
Eosinophils Absolute: 0.2 10*3/uL (ref 0.0–0.5)
Eosinophils Relative: 2 %
HCT: 27.3 % — ABNORMAL LOW (ref 39.0–52.0)
Hemoglobin: 8.6 g/dL — ABNORMAL LOW (ref 13.0–17.0)
Immature Granulocytes: 1 %
Lymphocytes Relative: 6 %
Lymphs Abs: 0.7 10*3/uL (ref 0.7–4.0)
MCH: 26.9 pg (ref 26.0–34.0)
MCHC: 31.5 g/dL (ref 30.0–36.0)
MCV: 85.3 fL (ref 80.0–100.0)
Monocytes Absolute: 0.8 10*3/uL (ref 0.1–1.0)
Monocytes Relative: 7 %
Neutro Abs: 9.6 10*3/uL — ABNORMAL HIGH (ref 1.7–7.7)
Neutrophils Relative %: 84 %
Platelets: 449 10*3/uL — ABNORMAL HIGH (ref 150–400)
RBC: 3.2 MIL/uL — ABNORMAL LOW (ref 4.22–5.81)
RDW: 18.4 % — ABNORMAL HIGH (ref 11.5–15.5)
WBC: 11.5 10*3/uL — ABNORMAL HIGH (ref 4.0–10.5)
nRBC: 0.3 % — ABNORMAL HIGH (ref 0.0–0.2)

## 2024-02-04 LAB — BASIC METABOLIC PANEL
Anion gap: 10 (ref 5–15)
BUN: 70 mg/dL — ABNORMAL HIGH (ref 8–23)
CO2: 16 mmol/L — ABNORMAL LOW (ref 22–32)
Calcium: 8.8 mg/dL — ABNORMAL LOW (ref 8.9–10.3)
Chloride: 129 mmol/L — ABNORMAL HIGH (ref 98–111)
Creatinine, Ser: 2.47 mg/dL — ABNORMAL HIGH (ref 0.61–1.24)
GFR, Estimated: 24 mL/min — ABNORMAL LOW (ref >60)
Glucose, Bld: 117 mg/dL — ABNORMAL HIGH (ref 70–99)
Potassium: 4.6 mmol/L (ref 3.5–5.1)
Sodium: 155 mmol/L — ABNORMAL HIGH (ref 135–145)

## 2024-02-04 LAB — VANCOMYCIN, RANDOM: Vancomycin Rm: 18 ug/mL

## 2024-02-04 MED ORDER — LORAZEPAM 2 MG/ML IJ SOLN: 2.0000 mg | Freq: Four times a day (QID) | INTRAMUSCULAR | Status: DC | PRN

## 2024-02-04 MED ORDER — POLYVINYL ALCOHOL 1.4 % OP SOLN: 1.0000 [drp] | Freq: Four times a day (QID) | OPHTHALMIC | Status: DC | PRN

## 2024-02-04 MED ORDER — MORPHINE SULFATE (PF) 2 MG/ML IV SOLN
2.0000 mg | INTRAVENOUS | Status: DC | PRN
  Administered 2024-02-04 (×4): 2 mg via INTRAVENOUS
  Filled 2024-02-04 (×4): qty 1

## 2024-02-04 MED ORDER — DEXTROSE 5 % IV SOLN: INTRAVENOUS | Status: DC

## 2024-02-04 MED ORDER — MORPHINE SULFATE (PF) 2 MG/ML IV SOLN: 2.0000 mg | INTRAVENOUS | Status: DC | PRN

## 2024-02-04 MED ORDER — VANCOMYCIN HCL 500 MG/100ML IV SOLN
500.0000 mg | Freq: Once | INTRAVENOUS | Status: DC
  Filled 2024-02-04: qty 100

## 2024-02-04 MED ORDER — BIOTENE DRY MOUTH MT LIQD: 15.0000 mL | OROMUCOSAL | Status: DC | PRN

## 2024-02-04 MED ORDER — HALOPERIDOL LACTATE 5 MG/ML IJ SOLN: 2.0000 mg | Freq: Four times a day (QID) | INTRAMUSCULAR | Status: DC | PRN

## 2024-02-04 MED ORDER — LORAZEPAM 2 MG PO TABS: 2.0000 mg | ORAL_TABLET | Freq: Four times a day (QID) | ORAL | Status: DC | PRN

## 2024-02-04 MED ORDER — HALOPERIDOL 0.5 MG PO TABS: 2.0000 mg | ORAL_TABLET | Freq: Four times a day (QID) | ORAL | Status: DC | PRN

## 2024-02-04 MED ORDER — HALOPERIDOL LACTATE 5 MG/ML IJ SOLN
2.0000 mg | Freq: Four times a day (QID) | INTRAMUSCULAR | Status: DC | PRN
  Administered 2024-02-04: 2 mg via INTRAVENOUS
  Filled 2024-02-04: qty 1

## 2024-02-04 NOTE — TOC Progression Note (Signed)
 Transition of Care [facility]) - Progression Note    Patient Details  Name: Jon Smith MRN: [id number] Date of Birth: 1928-12-04  Transition of Care [facility]) CM/SW Contact  Ladene Lady, LCSW Phone Number: 02/04/2024, 9:10 AM  Clinical Narrative:   Pt does not meet for [facility] hospice house and wants referral sent to [facility]. CSW has sent referral and messaged admissions.    Expected Discharge Plan: Skilled Nursing Facility Barriers to Discharge: Continued Medical Work up  Expected Discharge Plan and Services                                               Social Determinants of Health (SDOH) Interventions SDOH Screenings   Food Insecurity: Patient Unable To Answer (01/31/2024)  Housing: Patient Unable To Answer (01/31/2024)  Transportation Needs: Patient Unable To Answer (01/31/2024)  Utilities: Patient Unable To Answer (01/31/2024)  Financial Resource Strain: High Risk (07/25/2020)   Received from [facility], [facility]  Social Connections: Unknown (01/31/2024)  Tobacco Use: Medium Risk (01/31/2024)    Readmission Risk Interventions    02/03/2024    8:48 AM  Readmission Risk Prevention Plan  Transportation Screening Complete  Medication Review (RN Care Manager) Complete  HRI or Home Care Consult Complete  Palliative Care Screening Complete  Skilled Nursing Facility Complete

## 2024-02-04 NOTE — Discharge Summary (Signed)
 Physician Discharge Summary   Patient: Jon Smith MRN: [id number]  DOB: May 06, 1928   Admit:     Date of Admission: 01/30/2024 Admitted from: SNF   Discharge: Date of discharge: 02/04/24 Disposition: Hospice care Condition at discharge: stable  CODE STATUS: DNR     Discharge Physician: Laneta Blunt, DO [facility]     PCP: Lyle Avelina LABOR, NP  Recommendations for Outpatient Follow-up:  None - hospice care    Discharge Instructions     Diet - low sodium heart healthy   Complete by: As directed    Discharge wound care:   Complete by: As directed    Per hospice/comfort protocol   Increase activity slowly   Complete by: As directed          Discharge Diagnoses: Principal Problem:   Pneumonia due to infectious organism Active Problems:   Palliative care by specialist        Hospital course / significant events:   HPI: Jon Smith is a 89 y.o. male with medical history significant of CKD stage IIIb, hyperlipidemia, hypertension, peripheral artery disease, who lives in a nursing home brought in with concerns of worsening pneumonia as seen on an x-ray obtained in the facility in the setting of persistent cough.  Patient was recently admitted here last month and managed for dehydration with AKI.  He is unable to provide subjective information on account of dementia.    01/31: admitted to hospitalist service for sepsis d/t pneumonia/HCAP - started vanc + cefepime , AKI on CKD3b and acute on chronic metabolic acidosis, acute on chornic anemia, hyponatremia, hypotenision 02/01-02/04: npo d/t aspiration event on SLP eval, remains on abx, remains acidotic unable to take po sodium bicarb, nephrology managing fluids, was on D5W which was d/c 02/04 02/05: remains hypernatremic. More agitated. Palliative care in discussion w/ patient's son - decision reached for comfort measures only, hospice is able to take the patient today    Consultants:   Nephrology  Palliative care   Procedures/Surgeries: none      ASSESSMENT & PLAN:   Sepsis secondary to bilateral pneumonia Healthcare Associated Pneumonia given recent hospitalization <90d (+)MRSA screening in nares Patient presented with WBC 19, respiratory rate 25 in the setting of imaging showing bilateral pneumonia, Lactic acid 1.1 procalcitonin 36 Respiratory viral panel was negative Discontinuing treatments, labs, etc on comfort measures    Hypernatremia likely secondary to decreased oral intake Discontinuing treatments, labs, etc on comfort measures    AKI on CKD 3B due to dehydration-improved Acute on chronic metabolic acidosis Patient presenting with bicarb of 16 and creatinine elevated up to 5.16 on admission from baseline 1.6 Discontinuing treatments, labs, etc on comfort measures    Aspiration event on speech therapist evaluation Discontinuing treatments, labs, etc on comfort measures    Acute on chronic anemia Chronic iron  deficiency Folic acid  deficiency Discontinuing treatments, labs, etc on comfort measures    Dementia delirium precaution Discontinuing treatments, labs, etc on comfort measures  Focus on comfort / symptoms if agitation    Essential hypertension Discontinuing treatments, labs, etc on comfort measures    Generalized weakness Discontinuing treatments, labs, etc on comfort measures    BPH Discontinuing treatments, labs, etc on comfort measures    Hyperlipidemia Discontinuing treatments, labs, etc on comfort measures    Prostate CA status post treatment In remission Discontinuing treatments, labs, etc on comfort measures    Dementia Discontinuing treatments, labs, etc on comfort measures    Lung nodule Former smoker  CT scan C-spine showed 2.5 mm posterior left apical noncalcified lung nodule, stable in size and appearance when compared to recent CT chest in April 2024 Discontinuing treatments, labs, etc on comfort measures      No significant concerns based on BMI: Body mass index is 20.68 kg/m.  Underweight - under 18  overweight - 25 to 29 obese - 30 or more Class 1 obesity: BMI of 30.0 to 34 Class 2 obesity: BMI of 35.0 to 39 Class 3 obesity: BMI of 40.0 to 49 Super Morbid Obesity: BMI 50-59 Super-super Morbid Obesity: BMI 60+ Significantly low or high BMI is associated with higher medical risk.  Weight management advised as adjunct to other disease management and risk reduction treatments          Discharge Instructions  Allergies as of 02/04/2024       Reactions   Aspirin  Nausea And Vomiting   Sulfamethoxazole -trimethoprim     Other reaction(s): Blood Disorder Coagulopathy        Medication List     STOP taking these medications    acetaminophen  325 MG tablet Commonly known as: TYLENOL    Antacid Liquid 200-200-20 MG/5ML suspension Generic drug: alum & mag hydroxide-simeth   Aspirin  Low Dose 81 MG tablet Generic drug: aspirin  EC   atorvastatin  40 MG tablet Commonly known as: LIPITOR   clopidogrel  75 MG tablet Commonly known as: PLAVIX    cyanocobalamin  1000 MCG tablet   ferrous sulfate  325 (65 FE) MG EC tablet   finasteride  5 MG tablet Commonly known as: PROSCAR    folic acid  1 MG tablet Commonly known as: FOLVITE    guaifenesin 100 MG/5ML syrup Commonly known as: ROBITUSSIN   latanoprost  0.005 % ophthalmic solution Commonly known as: XALATAN    lisinopril  40 MG tablet Commonly known as: ZESTRIL    loperamide 2 MG tablet Commonly known as: IMODIUM A-D   magnesium  hydroxide 400 MG/5ML suspension Commonly known as: MILK OF MAGNESIA   melatonin 3 MG Tabs tablet   metoprolol  succinate 25 MG 24 hr tablet Commonly known as: TOPROL -XL   metoprolol  tartrate 25 MG tablet Commonly known as: LOPRESSOR    sertraline  50 MG tablet Commonly known as: ZOLOFT    sodium bicarbonate  650 MG tablet   tamsulosin  0.4 MG Caps capsule Commonly known as: FLOMAX                 Discharge Care Instructions  (From admission, onward)           Start     Ordered   02/04/24 0000  Discharge wound care:       Comments: Per hospice/comfort protocol   02/04/24 1457             Follow-up Information     Lyle Avelina LABOR, NP Follow up.   Specialty: Internal Medicine Why: Hospital follow up Contact information: [address] [place] [address] [phone]                 Allergies  Allergen Reactions   Aspirin  Nausea And Vomiting   Sulfamethoxazole -Trimethoprim      Other reaction(s): Blood Disorder Coagulopathy     Subjective: pt not able to contribute    Discharge Exam: BP 129/60 (BP Location: Left Arm)   Pulse 90   Temp (!) 97.4 F (36.3 C)   Resp 18   Wt 61.7 kg   SpO2 100%   BMI 20.68 kg/m  General: not in acute distress Cardiovascular: RRR, S1/S2 +, no rubs, no gallops Respiratory: unlabored breathing  Abdominal: Soft,  NT, ND, bowel sounds + Extremities: no edema, no cyanosis     The results of significant diagnostics from this hospitalization (including imaging, microbiology, ancillary and laboratory) are listed below for reference.     Microbiology: Recent Results (from the past 240 hours)  Blood culture (routine x 2)     Status: None   Collection Time: 01/30/24 12:23 PM   Specimen: BLOOD  Result Value Ref Range Status   Specimen Description BLOOD LEFT ANTECUBITAL  Final   Special Requests   Final    BOTTLES DRAWN AEROBIC AND ANAEROBIC Blood Culture adequate volume   Culture   Final    NO GROWTH 5 DAYS Performed at [facility], [address] [place], [place] [address]    Report Status 02/04/2024 FINAL  Final  Blood culture (routine x 2)     Status: None   Collection Time: 01/30/24  3:19 PM   Specimen: BLOOD  Result Value Ref Range Status   Specimen Description BLOOD BLOOD RIGHT ARM  Final   Special Requests   Final    BOTTLES DRAWN AEROBIC AND ANAEROBIC Blood  Culture results may not be optimal due to an inadequate volume of blood received in culture bottles   Culture   Final    NO GROWTH 5 DAYS Performed at [facility], [address] [place], [place] [address]    Report Status 02/04/2024 FINAL  Final  Resp panel by RT-PCR (RSV, Flu A&B, Covid) Anterior Nasal Swab     Status: None   Collection Time: 01/30/24  3:19 PM   Specimen: Anterior Nasal Swab  Result Value Ref Range Status   SARS Coronavirus 2 by RT PCR NEGATIVE NEGATIVE Final    Comment: (NOTE) SARS-CoV-2 target nucleic acids are NOT DETECTED.  The SARS-CoV-2 RNA is generally detectable in upper respiratory specimens during the acute phase of infection. The lowest concentration of SARS-CoV-2 viral copies this assay can detect is 138 copies/mL. A negative result does not preclude SARS-Cov-2 infection and should not be used as the sole basis for treatment or other patient management decisions. A negative result may occur with  improper specimen collection/handling, submission of specimen other than nasopharyngeal swab, presence of viral mutation(s) within the areas targeted by this assay, and inadequate number of viral copies(<138 copies/mL). A negative result must be combined with clinical observations, patient history, and epidemiological information. The expected result is Negative.  Fact Sheet for Patients:  bloggercourse.com  Fact Sheet for Healthcare Providers:  seriousbroker.it  This test is no t yet approved or cleared by the [place]  FDA and  has been authorized for detection and/or diagnosis of SARS-CoV-2 by FDA under an Emergency Use Authorization (EUA). This EUA will remain  in effect (meaning this test can be used) for the duration of the COVID-19 declaration under Section 564(b)(1) of the Act, 21 U.S.C.section 360bbb-3(b)(1), unless the authorization is terminated  or revoked sooner.        Influenza A by PCR NEGATIVE NEGATIVE Final   Influenza B by PCR NEGATIVE NEGATIVE Final    Comment: (NOTE) The Xpert Xpress SARS-CoV-2/FLU/RSV plus assay is intended as an aid in the diagnosis of influenza from Nasopharyngeal swab specimens and should not be used as a sole basis for treatment. Nasal washings and aspirates are unacceptable for Xpert Xpress SARS-CoV-2/FLU/RSV testing.  Fact Sheet for Patients: bloggercourse.com  Fact Sheet for Healthcare Providers: seriousbroker.it  This test is not yet approved or cleared by the [place]  FDA and has  been authorized for detection and/or diagnosis of SARS-CoV-2 by FDA under an Emergency Use Authorization (EUA). This EUA will remain in effect (meaning this test can be used) for the duration of the COVID-19 declaration under Section 564(b)(1) of the Act, 21 U.S.C. section 360bbb-3(b)(1), unless the authorization is terminated or revoked.     Resp Syncytial Virus by PCR NEGATIVE NEGATIVE Final    Comment: (NOTE) Fact Sheet for Patients: bloggercourse.com  Fact Sheet for Healthcare Providers: seriousbroker.it  This test is not yet approved or cleared by the [place]  FDA and has been authorized for detection and/or diagnosis of SARS-CoV-2 by FDA under an Emergency Use Authorization (EUA). This EUA will remain in effect (meaning this test can be used) for the duration of the COVID-19 declaration under Section 564(b)(1) of the Act, 21 U.S.C. section 360bbb-3(b)(1), unless the authorization is terminated or revoked.  Performed at [facility], [address] [place], [place] [address]   MRSA Next Gen by PCR, Nasal     Status: Abnormal   Collection Time: 01/31/24  4:58 AM   Specimen: Nasal Mucosa; Nasal Swab  Result Value Ref Range Status   MRSA by PCR Next Gen DETECTED (A) NOT DETECTED Final    Comment: RESULT  CALLED TO, READ BACK BY AND VERIFIED WITH: JENNIFER WHITLEY AT 9250 01/31/24.PMF (NOTE) The GeneXpert MRSA Assay (FDA approved for NASAL specimens only), is one component of a comprehensive MRSA colonization surveillance program. It is not intended to diagnose MRSA infection nor to guide or monitor treatment for MRSA infections. Test performance is not FDA approved in patients less than 65 years old. Performed at [facility], [address] [place], [place] [address]      Labs: BNP (last 3 results) No results for input(s): BNP in the last 8760 hours. Basic Metabolic Panel: Recent Labs  Lab 01/31/24 0458 02/01/24 0529 02/02/24 0536 02/03/24 0454 02/04/24 0515  NA 150* 151* 155* 158* 155*  K 4.7 4.4 4.9 4.9 4.6  CL 122* 126* 128* 130* 129*  CO2 13* 14* 12* 16* 16*  GLUCOSE 78 75 72 143* 117*  BUN 106* 95* 88* 81* 70*  CREATININE 4.37* 3.66* 3.31* 2.80* 2.47*  CALCIUM  8.2* 8.2* 8.5* 8.6* 8.8*   Liver Function Tests: Recent Labs  Lab 01/30/24 1223  AST 43*  ALT 26  ALKPHOS 73  BILITOT 0.9  PROT 7.0  ALBUMIN 2.6*   No results for input(s): LIPASE, AMYLASE in the last 168 hours. No results for input(s): AMMONIA in the last 168 hours. CBC: Recent Labs  Lab 01/31/24 0458 02/01/24 0529 02/02/24 0536 02/03/24 0454 02/04/24 0515  WBC 14.1* 11.3* 12.9* 11.4* 11.5*  NEUTROABS 12.0* 9.1* 10.4* 9.3* 9.6*  HGB 7.4* 7.1* 8.0* 7.9* 8.6*  HCT 23.5* 21.8* 25.3* 24.4* 27.3*  MCV 87.7 84.5 85.5 85.6 85.3  PLT 436* 422* 474* 440* 449*   Cardiac Enzymes: No results for input(s): CKTOTAL, CKMB, CKMBINDEX, TROPONINI in the last 168 hours. BNP: Invalid input(s): POCBNP CBG: No results for input(s): GLUCAP in the last 168 hours. D-Dimer No results for input(s): DDIMER in the last 72 hours. Hgb A1c No results for input(s): HGBA1C in the last 72 hours. Lipid Profile No results for input(s): CHOL, HDL, LDLCALC, TRIG, CHOLHDL,  LDLDIRECT in the last 72 hours. Thyroid  function studies No results for input(s): TSH, T4TOTAL, T3FREE, THYROIDAB in the last 72 hours.  Invalid input(s): FREET3 Anemia work up No results for input(s): VITAMINB12, FOLATE, FERRITIN, TIBC, IRON , RETICCTPCT in  the last 72 hours. Urinalysis    Component Value Date/Time   COLORURINE YELLOW 12/05/2023 0614   APPEARANCEUR CLOUDY (A) 12/05/2023 0614   APPEARANCEUR Clear 01/16/2016 1500   LABSPEC 1.019 12/05/2023 0614   PHURINE 5.0 12/05/2023 0614   GLUCOSEU NEGATIVE 12/05/2023 0614   HGBUR SMALL (A) 12/05/2023 0614   BILIRUBINUR NEGATIVE 12/05/2023 0614   BILIRUBINUR Negative 01/16/2016 1500   KETONESUR NEGATIVE 12/05/2023 0614   PROTEINUR 30 (A) 12/05/2023 0614   NITRITE NEGATIVE 12/05/2023 0614   LEUKOCYTESUR LARGE (A) 12/05/2023 0614   Sepsis Labs Recent Labs  Lab 02/01/24 0529 02/02/24 0536 02/03/24 0454 02/04/24 0515  WBC 11.3* 12.9* 11.4* 11.5*   Microbiology Recent Results (from the past 240 hours)  Blood culture (routine x 2)     Status: None   Collection Time: 01/30/24 12:23 PM   Specimen: BLOOD  Result Value Ref Range Status   Specimen Description BLOOD LEFT ANTECUBITAL  Final   Special Requests   Final    BOTTLES DRAWN AEROBIC AND ANAEROBIC Blood Culture adequate volume   Culture   Final    NO GROWTH 5 DAYS Performed at [facility], [address] [place], [place] [address]    Report Status 02/04/2024 FINAL  Final  Blood culture (routine x 2)     Status: None   Collection Time: 01/30/24  3:19 PM   Specimen: BLOOD  Result Value Ref Range Status   Specimen Description BLOOD BLOOD RIGHT ARM  Final   Special Requests   Final    BOTTLES DRAWN AEROBIC AND ANAEROBIC Blood Culture results may not be optimal due to an inadequate volume of blood received in culture bottles   Culture   Final    NO GROWTH 5 DAYS Performed at [facility], [address] [place], [place]  [address]    Report Status 02/04/2024 FINAL  Final  Resp panel by RT-PCR (RSV, Flu A&B, Covid) Anterior Nasal Swab     Status: None   Collection Time: 01/30/24  3:19 PM   Specimen: Anterior Nasal Swab  Result Value Ref Range Status   SARS Coronavirus 2 by RT PCR NEGATIVE NEGATIVE Final    Comment: (NOTE) SARS-CoV-2 target nucleic acids are NOT DETECTED.  The SARS-CoV-2 RNA is generally detectable in upper respiratory specimens during the acute phase of infection. The lowest concentration of SARS-CoV-2 viral copies this assay can detect is 138 copies/mL. A negative result does not preclude SARS-Cov-2 infection and should not be used as the sole basis for treatment or other patient management decisions. A negative result may occur with  improper specimen collection/handling, submission of specimen other than nasopharyngeal swab, presence of viral mutation(s) within the areas targeted by this assay, and inadequate number of viral copies(<138 copies/mL). A negative result must be combined with clinical observations, patient history, and epidemiological information. The expected result is Negative.  Fact Sheet for Patients:  bloggercourse.com  Fact Sheet for Healthcare Providers:  seriousbroker.it  This test is no t yet approved or cleared by the [place]  FDA and  has been authorized for detection and/or diagnosis of SARS-CoV-2 by FDA under an Emergency Use Authorization (EUA). This EUA will remain  in effect (meaning this test can be used) for the duration of the COVID-19 declaration under Section 564(b)(1) of the Act, 21 U.S.C.section 360bbb-3(b)(1), unless the authorization is terminated  or revoked sooner.       Influenza A by PCR NEGATIVE NEGATIVE Final   Influenza B by PCR  NEGATIVE NEGATIVE Final    Comment: (NOTE) The Xpert Xpress SARS-CoV-2/FLU/RSV plus assay is intended as an aid in the diagnosis of influenza from  Nasopharyngeal swab specimens and should not be used as a sole basis for treatment. Nasal washings and aspirates are unacceptable for Xpert Xpress SARS-CoV-2/FLU/RSV testing.  Fact Sheet for Patients: bloggercourse.com  Fact Sheet for Healthcare Providers: seriousbroker.it  This test is not yet approved or cleared by the [place]  FDA and has been authorized for detection and/or diagnosis of SARS-CoV-2 by FDA under an Emergency Use Authorization (EUA). This EUA will remain in effect (meaning this test can be used) for the duration of the COVID-19 declaration under Section 564(b)(1) of the Act, 21 U.S.C. section 360bbb-3(b)(1), unless the authorization is terminated or revoked.     Resp Syncytial Virus by PCR NEGATIVE NEGATIVE Final    Comment: (NOTE) Fact Sheet for Patients: bloggercourse.com  Fact Sheet for Healthcare Providers: seriousbroker.it  This test is not yet approved or cleared by the [place]  FDA and has been authorized for detection and/or diagnosis of SARS-CoV-2 by FDA under an Emergency Use Authorization (EUA). This EUA will remain in effect (meaning this test can be used) for the duration of the COVID-19 declaration under Section 564(b)(1) of the Act, 21 U.S.C. section 360bbb-3(b)(1), unless the authorization is terminated or revoked.  Performed at [facility], [address] [place], [place] [address]   MRSA Next Gen by PCR, Nasal     Status: Abnormal   Collection Time: 01/31/24  4:58 AM   Specimen: Nasal Mucosa; Nasal Swab  Result Value Ref Range Status   MRSA by PCR Next Gen DETECTED (A) NOT DETECTED Final    Comment: RESULT CALLED TO, READ BACK BY AND VERIFIED WITH: JENNIFER WHITLEY AT 9250 01/31/24.PMF (NOTE) The GeneXpert MRSA Assay (FDA approved for NASAL specimens only), is one component of a comprehensive MRSA colonization  surveillance program. It is not intended to diagnose MRSA infection nor to guide or monitor treatment for MRSA infections. Test performance is not FDA approved in patients less than 78 years old. Performed at [facility], [address] [place], [place] [address]    Imaging CT Head Wo Contrast Result Date: 01/30/2024 CLINICAL DATA:  Altered mental status EXAM: CT HEAD WITHOUT CONTRAST TECHNIQUE: Contiguous axial images were obtained from the base of the skull through the vertex without intravenous contrast. RADIATION DOSE REDUCTION: This exam was performed according to the departmental dose-optimization program which includes automated exposure control, adjustment of the mA and/or kV according to patient size and/or use of iterative reconstruction technique. COMPARISON:  12/04/2023 FINDINGS: Brain: No evidence of acute infarction, hemorrhage, mass, mass effect, or midline shift. No hydrocephalus or extra-axial fluid collection. Moderate cerebral atrophy. Remote infarcts in bilateral basal ganglia, left cerebellum, and pons. Periventricular white matter changes, likely the sequela of chronic small vessel ischemic disease. Vascular: No hyperdense vessel. Atherosclerotic calcifications in the intracranial carotid and vertebral arteries. Skull: Negative for fracture or focal lesion. Sinuses/Orbits: No acute finding. Other: The mastoid air cells are well aerated. IMPRESSION: No acute intracranial process. Electronically Signed   By: Donald Campion M.D.   On: 01/30/2024 16:56   DG Chest 1 View Result Date: 01/30/2024 CLINICAL DATA:  Recently treated for pneumonia with worsening radiographic findings EXAM: CHEST  1 VIEW COMPARISON:  Chest radiograph dated 12/04/2023 FINDINGS: Patient is rotated to the right. Normal lung volumes. Patchy bibasilar opacities. No pleural effusion or pneumothorax. The heart size and mediastinal contours  are within normal limits. No acute osseous abnormality. IMPRESSION:  Patchy bibasilar opacities, which may represent atelectasis or reported residual pneumonia. Electronically Signed   By: Limin  Xu M.D.   On: 01/30/2024 13:27      Time coordinating discharge: over 30 minutes  SIGNED:  Vidhi Delellis DO [facility]

## 2024-02-04 NOTE — TOC Transition Note (Signed)
 Transition of Care [facility]) - Discharge Note   Patient Details  Name: Jon Smith MRN: [id number] Date of Birth: 02-Jan-1928  [facility] CM/SW Contact:  Ladene Lady, LCSW Phone Number: 02/04/2024, 2:50 PM   Clinical Narrative:   Pt has orders to discharge to [facility], medical neccesity printed. DNR on chart. Hospice to call for transport.    Final next level of care: Hospice Medical Facility Barriers to Discharge: Barriers Resolved   Patient Goals and CMS Choice Patient states their goals for this hospitalization and ongoing recovery are:: [facility] CMS Medicare.gov Compare Post Acute Care list provided to:: Patient Represenative (must comment) Choice offered to / list presented to : [facility] POA / Guardian      Discharge Placement                Patient to be transferred to facility by: [facility] Name of family member notified: Sudie Patient and family notified of of transfer: 02/04/24  Discharge Plan and Services Additional resources added to the After Visit Summary for                                       Social Drivers of Health (SDOH) Interventions SDOH Screenings   Food Insecurity: Patient Unable To Answer (01/31/2024)  Housing: Patient Unable To Answer (01/31/2024)  Transportation Needs: Patient Unable To Answer (01/31/2024)  Utilities: Patient Unable To Answer (01/31/2024)  Financial Resource Strain: High Risk (07/25/2020)   Received from [facility]  Social Connections: Unknown (01/31/2024)  Tobacco Use: Medium Risk (01/31/2024)     Readmission Risk Interventions    02/03/2024    8:48 AM  Readmission Risk Prevention Plan  Transportation Screening Complete  Medication Review (RN Care Manager) Complete  HRI or Home Care Consult Complete  Palliative Care Screening Complete  Skilled Nursing Facility Complete

## 2024-02-04 NOTE — Progress Notes (Signed)
 [facility]  PROGRESS NOTE   Subjective:   Patient seen laying in bed No family present Nonverbal to simple questioning Will not open eyes today  Sodium 155  Objective:  Vital signs: Blood pressure 129/60, pulse 90, temperature (!) 97.4 F (36.3 C), resp. rate 18, weight 61.7 kg, SpO2 100%.  Intake/Output Summary (Last 24 hours) at 02/04/2024 1214 Last data filed at 02/04/2024 0254 Gross per 24 hour  Intake 1334.28 ml  Output 1100 ml  Net 234.28 ml   Filed Weights   01/30/24 1217  Weight: 61.7 kg     Physical Exam: General:  No acute distress  Head: Cachectic  Eyes:  Anicteric  Lungs:   Clear to auscultation, normal effort  Heart:  S1S2 no rubs  Abdomen:   Soft, nontender, bowel sounds present  Extremities: No peripheral edema.  Neurologic: Not interactive, not following commands  Skin:  No lesions  Access: None    Basic Metabolic Panel: Recent Labs  Lab 01/31/24 0458 02/01/24 0529 02/02/24 0536 02/03/24 0454 02/04/24 0515  NA 150* 151* 155* 158* 155*  K 4.7 4.4 4.9 4.9 4.6  CL 122* 126* 128* 130* 129*  CO2 13* 14* 12* 16* 16*  GLUCOSE 78 75 72 143* 117*  BUN 106* 95* 88* 81* 70*  CREATININE 4.37* 3.66* 3.31* 2.80* 2.47*  CALCIUM  8.2* 8.2* 8.5* 8.6* 8.8*   GFR: Estimated Creatinine Clearance: 16 mL/min (A) (by C-G formula based on SCr of 2.47 mg/dL (H)).  Liver Function Tests: Recent Labs  Lab 01/30/24 1223  AST 43*  ALT 26  ALKPHOS 73  BILITOT 0.9  PROT 7.0  ALBUMIN 2.6*   No results for input(s): LIPASE, AMYLASE in the last 168 hours. No results for input(s): AMMONIA in the last 168 hours.  CBC: Recent Labs  Lab 01/31/24 0458 02/01/24 0529 02/02/24 0536 02/03/24 0454 02/04/24 0515  WBC 14.1* 11.3* 12.9* 11.4* 11.5*  NEUTROABS 12.0* 9.1* 10.4* 9.3* 9.6*  HGB 7.4* 7.1* 8.0* 7.9* 8.6*  HCT 23.5* 21.8* 25.3* 24.4* 27.3*  MCV 87.7 84.5 85.5 85.6 85.3  PLT 436* 422* 474* 440* 449*     HbA1C: Hgb A1c MFr Bld   Date/Time Value Ref Range Status  12/05/2023 05:56 AM 5.4 4.8 - 5.6 % Final    Comment:    (NOTE) Pre diabetes:          5.7%-6.4%  Diabetes:              >6.4%  Glycemic control for   <7.0% adults with diabetes     Urinalysis: No results for input(s): COLORURINE, LABSPEC, PHURINE, GLUCOSEU, HGBUR, BILIRUBINUR, KETONESUR, PROTEINUR, UROBILINOGEN, NITRITE, LEUKOCYTESUR in the last 72 hours.  Invalid input(s): APPERANCEUR    Imaging: No results found.    Medications:    ceFEPime  (MAXIPIME ) IV 1 g (02/03/24 1747)   dextrose  125 mL/hr at 02/04/24 1135   vancomycin       feeding supplement  237 mL Oral BID BM   finasteride   5 mg Oral Daily   heparin  injection (subcutaneous)  5,000 Units Subcutaneous Q8H   leptospermum manuka honey  1 Application Topical Daily   mupirocin  ointment   Nasal BID   vancomycin  variable dose per unstable renal function (pharmacist dosing)   Does not apply See admin instructions    Assessment/ Plan:     89 y.o. male with a PMHx of hypertension, hyperlipidemia, peripheral vascular disease, chronic kidney disease who is a long-term nursing home patient now comes in with  persistent cough.  He is being treated for pneumonia in the emergency room.  He is now found to have acute kidney injury.  He has been on lisinopril  at 40 mg daily.  He has a baseline creatinine of about 1.7.  He had a renal sonogram last month which did not show any obstruction.   Principal Problem:   Bilateral pneumonia   #1: Acute kidney injury on chronic kidney disease: Patient has CKD stage IIIb with a baseline creatinine of 1.7.  Patient now has acute kidney injury which is most likely secondary to prerenal azotemia due to decreased p.o. intake complicated by ATN secondary to sepsis.      Renal function continues to improve slowly.  Continue supportive measures.  #2:  Acute metabolic acidosis: Metabolic acidosis can be secondary to sepsis complicated by  acute kidney injury.  Serum bicarb stable at 16, continue oral supplementation.   #3: Sepsis: Prescribed vancomycin  and cefepime .  Management per primary team   #4: Hypernatremia: Possibly secondary to decreased free water intake.  IVF stopped yesterday. Will restart today, D5 @ 160ml/hr.   Palliative care has been consulted and will meet with family to discuss goals of care. Will continue treatment until formal decision is made.     LOS: 5 [facility] 2/5/202512:14 PM

## 2024-02-04 NOTE — Progress Notes (Signed)
 OT Cancellation Note  Patient Details Name: Jon Smith MRN: [id number] DOB: 08/09/1928   Cancelled Treatment:    Reason Eval/Treat Not Completed: Patient declined, no reason specified OT/PT attempted to see pt. Pt received with bilat mitts on, unable to answer what his name is but [facility] when asked location. Pt resistive to all mobility attempts, holding on to bed rail and shaking his head no when asked to sit EOB. Slight attempt to wash face when provided washcloth. Further mobility attempts deferred d/t pt refusal. Will attempt one more session and sign off if pt declines participation.   Marcelino Campos L. Sly Parlee, OTR/L  02/04/24, 12:48 PM

## 2024-02-04 NOTE — NC FL2 (Signed)
 [facility]  MEDICAID [id number] LEVEL OF CARE FORM     IDENTIFICATION  Patient Name: Jon Smith Birthdate: 03/20/1928 Sex: male Admission Date (Current Location): 01/30/2024  [place] and [place] Number:  Chiropodist and Address:  [facility], [address] [place], [place] [address]      Provider Number: [id number]  Attending Physician Name and Address:  Marsa Edelman, DO  Relative Name and Phone Number:  Tamara, Kenyon (Son)  [phone]    Current Level of Care: Hospital Recommended Level of Care: Skilled Nursing Facility Prior Approval Number:    Date Approved/Denied:   PASRR Number: [id number]  Discharge Plan: SNF    Current Diagnoses: Patient Active Problem List   Diagnosis Date Noted   Palliative care by specialist 02/03/2024   Pneumonia due to infectious organism 01/30/2024   Dehydration 12/09/2023   Lung nodule 12/09/2023   AKI (acute kidney injury) (HCC) 12/05/2023   Dementia (HCC) 12/05/2023   Pressure injury of skin 12/05/2023   Acute on chronic anemia 11/25/2023   CKD stage 3b, GFR 30-44 ml/min (HCC) 11/25/2023   Depression 11/25/2023   UTI (urinary tract infection) 04/11/2023   Altered mental status 04/11/2023   Generalized weakness 04/11/2023   BPH (benign prostatic hyperplasia) 04/11/2023   CVA (cerebral vascular accident) (HCC) 04/11/2023   Dysphagia 04/11/2023   Renovascular hypertension 10/13/2020   Disease characterized by destruction of skeletal muscle 07/25/2020   Elevated AST (SGOT) 07/25/2020   Fall 07/25/2020   Hyperkalemia 07/25/2020   Urinary retention 07/25/2020   Vitamin B12 deficiency 08/20/2019   Generalized muscle weakness 08/18/2019   Asymptomatic proteinuria 02/22/2019   Vitamin D deficiency 02/22/2019   Chronic anemia 02/21/2019   High risk medication use 02/21/2019   Scalp hematoma 04/28/2018   Urine test positive for microalbuminuria 08/27/2017   Neck pain 07/01/2017   DDD  (degenerative disc disease), cervical 07/01/2017   Prostate cancer (HCC) 01/16/2016   Elevated PSA 01/16/2016   Protein-calorie malnutrition, severe 12/12/2015   Gross hematuria    Acute hepatitis 12/08/2015   Elevated LFTs    Abnormal liver function tests 12/06/2015   H/O malignant neoplasm of prostate 09/18/2015   Hypercholesteremia 05/17/2014   Essential hypertension 05/17/2014   Photodermatitis due to sun 05/17/2014    Orientation RESPIRATION BLADDER Height & Weight     Self  Normal Incontinent Weight: 136 lb (61.7 kg) Height:     BEHAVIORAL SYMPTOMS/MOOD NEUROLOGICAL BOWEL NUTRITION STATUS      Incontinent    AMBULATORY STATUS COMMUNICATION OF NEEDS Skin     Verbally Normal                       Personal Care Assistance Level of Assistance              Functional Limitations Info  Sight, Hearing, Speech Sight Info: Impaired Hearing Info: Impaired Speech Info: Adequate    SPECIAL CARE FACTORS FREQUENCY                       Contractures Contractures Info: Not present    Additional Factors Info  Code Status, Isolation Precautions, Allergies Code Status Info: DNR-limited Allergies Info: : Aspirin , Sulfamethoxazole -trimethoprim      Isolation Precautions Info: MRSA     Current Medications (02/04/2024):  This is the current hospital active medication list Current Facility-Administered Medications  Medication Dose Route Frequency Provider Last Rate Last Admin   ceFEPIme  (MAXIPIME ) 1 g in sodium chloride  0.9 %  100 mL IVPB  1 g Intravenous Q24H Chappell, Alex B, RPH 200 mL/hr at 02/03/24 1747 1 g at 02/03/24 1747   feeding supplement (ENSURE ENLIVE / ENSURE PLUS) liquid 237 mL  237 mL Oral BID BM Dorinda Homans T, MD   237 mL at 01/31/24 1655   finasteride  (PROSCAR ) tablet 5 mg  5 mg Oral Daily Dorinda Homans T, MD   5 mg at 01/31/24 1009   heparin  injection 5,000 Units  5,000 Units Subcutaneous Q8H Dorinda Homans T, MD   5,000 Units at 02/04/24 0557    hydrALAZINE  (APRESOLINE ) injection 10 mg  10 mg Intravenous Q6H PRN Dorinda Homans DASEN, MD       leptospermum manuka honey (MEDIHONEY) paste 1 Application  1 Application Topical Daily Dorinda Homans DASEN, MD   1 Application at 02/03/24 1440   mupirocin  ointment (BACTROBAN ) 2 %   Nasal BID Dorinda Homans DASEN, MD   Given at 02/03/24 2104   vancomycin  variable dose per unstable renal function (pharmacist dosing)   Does not apply See admin instructions Lenon Elsie HERO, [facility]         Discharge Medications: Please see discharge summary for a list of discharge medications.  Relevant Imaging Results:  Relevant Lab Results:   Additional Information [id number]  Ladene Lady, LCSW

## 2024-02-04 NOTE — Progress Notes (Signed)
 Physical Therapy Treatment Patient Details Name: Trinity Hyland MRN: [id number] DOB: 14-May-1928 Today's Date: 02/04/2024   History of Present Illness Pt admitted to [facility] on 01/30/24 for c/o worsening PNA. Recent hospital admission on 1/20-1/26 for PNA and UTI. Dx revealed sepsis secondary to bilateral PNA and acute on chronic metabolic acidosis. Significant PMH includes: dementia, CKD, HTN, HLD, PrCA, anemia, PAD, back pain    PT Comments  Patient basically refusing any mobility at this time.Resisting movement to edge of bed. Grimacing with attempts at B knee extension. Patient is unable to actively participate with therapy at this time. Will be appropriate to return to LTC.      If plan is discharge home, recommend the following: Two people to help with walking and/or transfers;Two people to help with bathing/dressing/bathroomLTC   Can travel by private vehicle     No  Equipment Recommendations  None recommended by PT    Recommendations for Other Services       Precautions / Restrictions Precautions Precautions: Fall Restrictions Weight Bearing Restrictions Per Provider Order: No Other Position/Activity Restrictions: DNR/Limited     Mobility  Bed Mobility Overal bed mobility: Needs Assistance             General bed mobility comments: Patient resisting mobility, movement of LEs. Declines sitting up    Transfers                   General transfer comment: declines    Ambulation/Gait                   Stairs             Wheelchair Mobility     Tilt Bed    Modified Rankin (Stroke Patients Only)       Balance                                            Cognition Arousal: Alert Behavior During Therapy: Flat affect Overall Cognitive Status: No family/caregiver present to determine baseline cognitive functioning Area of Impairment: Awareness, Problem solving, Memory, Following commands                  Orientation Level: Disoriented to, Time, Situation Current Attention Level: Sustained Memory: Decreased short-term memory Following Commands:  (unable to follow commands. Resisting movement) Safety/Judgement: Decreased awareness of safety, Decreased awareness of deficits Awareness: Intellectual Problem Solving: Slow processing, Difficulty sequencing, Requires verbal cues, Requires tactile cues, Decreased initiation          Exercises      General Comments        Pertinent Vitals/Pain Pain Assessment Pain Assessment: Faces Faces Pain Scale: Hurts even more Breathing: occasional labored breathing, short period of hyperventilation Negative Vocalization: occasional moan/groan, low speech, negative/disapproving quality Facial Expression: facial grimacing Body Language: tense, distressed pacing, fidgeting Consolability: distracted or reassured by voice/touch PAINAD Score: 6 Pain Location: B knees with stretching into extension Pain Intervention(s): Repositioned, Monitored during session    Home Living                          Prior Function            PT Goals (current goals can now be found in the care plan section) Acute Rehab PT Goals PT Goal Formulation: Patient unable to participate in goal setting  Progress towards PT goals: Not progressing toward goals - comment (patient has dementia, baseline W/C bound. Signing off)    Frequency    Min 1X/week      PT Plan      Co-evaluation              AM-PAC PT 6 Clicks Mobility   Outcome Measure  Help needed turning from your back to your side while in a flat bed without using bedrails?: Total Help needed moving from lying on your back to sitting on the side of a flat bed without using bedrails?: Total Help needed moving to and from a bed to a chair (including a wheelchair)?: Total Help needed standing up from a chair using your arms (e.g., wheelchair or bedside chair)?: Total Help needed to walk  in hospital room?: Total Help needed climbing 3-5 steps with a railing? : Total 6 Click Score: 6    End of Session   Activity Tolerance: Patient limited by pain;Other (comment) (cognitive status) Patient left: in bed;with call bell/phone within reach;with bed alarm set Nurse Communication: Mobility status PT Visit Diagnosis: Muscle weakness (generalized) (M62.81);Other abnormalities of gait and mobility (R26.89)     Time: 8877-8866 PT Time Calculation (min) (ACUTE ONLY): 11 min  Charges:      PT General Charges $$ ACUTE PT VISIT: 1 Visit                     Valerya Maxton, PT, GCS 02/04/24,1:05 PM

## 2024-02-04 NOTE — Plan of Care (Signed)
  Problem: Nutrition: Goal: Adequate nutrition will be maintained Outcome: Progressing   Problem: Elimination: Goal: Will not experience complications related to urinary retention Outcome: Progressing   Problem: Pain Managment: Goal: General experience of comfort will improve and/or be controlled Outcome: Progressing   Problem: Safety: Goal: Ability to remain free from injury will improve Outcome: Progressing   Problem: Skin Integrity: Goal: Risk for impaired skin integrity will decrease Outcome: Progressing

## 2024-02-04 NOTE — Consult Note (Addendum)
 Pharmacy Antibiotic Note  ASSESSMENT: 89 y.o. male with PMH including CKD3b, HLD, HTN, PAD is presenting with pneumonia / HCAP. Patient is in AKI. CXR shows bilateral PNA. MRSA nares positive. He has leukocytosis but is VSS and on RA, Pharmacy has been consulted to manage cefepime  and vancomycin  dosing.  Assessment: Day 6 of antibiotics  Vancomycin  500 mg IV x 1 given on 2/4 @ 1019 Vanc rm 2/5 @ 0913: 18 Patient remains in AKI but is improving (baseline Scr 1.5-1.8, current Scr 2.47)  Patient measurements: Weight: 61.7 kg (136 lb)  Vital signs: Temp: 97.4 F (36.3 C) (02/05 0851) BP: 129/60 (02/05 0851) Pulse Rate: 90 (02/05 0851) Recent Labs  Lab 02/02/24 0536 02/03/24 0454 02/04/24 0515  WBC 12.9* 11.4* 11.5*  CREATININE 3.31* 2.80* 2.47*   Estimated Creatinine Clearance: 16 mL/min (A) (by C-G formula based on SCr of 2.47 mg/dL (H)).  Allergies: Allergies  Allergen Reactions   Aspirin  Nausea And Vomiting   Sulfamethoxazole -Trimethoprim      Other reaction(s): Blood Disorder Coagulopathy    Antimicrobials this admission: Cefepime  1/31 >> Vancomycin  1/31 >>  Dose adjustments this admission: N/A  Microbiology results: 1/31 BCx: NGTD 1/31 Flu/Covid/RSV: negative 2/1 MRSA PCR: positive  PLAN: Increase to cefepime  2 g IV q24H Continue to dose vancomycin  by level given current AKI Give Vancomycin  500 mg IV x 1, will dose for later this evening at 1700 given supratherapeutic level at this time Will order vanc random for 2/6 @ 2100 Follow up culture results to assess for antibiotic optimization. Monitor renal function to assess for any necessary antibiotic dosing changes.   Thank you for allowing pharmacy to be a part of this patient's care.  Lum Mania, PharmD Clinical Pharmacist 02/04/2024 10:02 AM

## 2024-02-04 NOTE — Progress Notes (Signed)
 [facility] Liaison Note    Hospital Liaison team will follow patient peripherally at this time.  If patient does not discharge to [facility], [facility] can follow patient at a different LTC facility.    Please call with any Hospice related questions or concerns.   Thank you for the opportunity to participate in this patient's care.     [facility] Liaison [phone]

## 2024-02-04 NOTE — Hospital Course (Signed)
 Hospital course / significant events:   HPI: Jon Smith is a 89 y.o. male with medical history significant of CKD stage IIIb, hyperlipidemia, hypertension, peripheral artery disease, who lives in a nursing home brought in with concerns of worsening pneumonia as seen on an x-ray obtained in the facility in the setting of persistent cough.  Patient was recently admitted here last month and managed for dehydration with AKI.  He is unable to provide subjective information on account of dementia.    01/31: admitted to hospitalist service for sepsis d/t pneumonia/HCAP - started vanc + cefepime , AKI on CKD3b and acute on chronic metabolic acidosis, acute on chornic anemia, hyponatremia, hypotenision 02/01-02/04: npo d/t aspiration event on SLP eval, remains on abx, remains acidotic unable to take po sodium bicarb, nephrology managing fluids, was on D5W which was d/c 02/04 02/05: remains hypernatremic. Plan is for d/c to SNF w/ hospice services to follow   Consultants:  Nephrology  Palliative care   Procedures/Surgeries: none      ASSESSMENT & PLAN:   Sepsis secondary to bilateral pneumonia [facility] Pneumonia given recent hospitalization <90d (+)MRSA screening in nares Patient presented with WBC 19, respiratory rate 25 in the setting of imaging showing bilateral pneumonia, Lactic acid 1.1 procalcitonin 36 Respiratory viral panel was negative Given recent hospitalization patient is being covered with Vanco and cefepime  MRSA in the nares positive, placed on mupirocin  de-escalate antibiotics based on culture results   Hypernatremia likely secondary to decreased oral intake Continue D5W Monitor closely   AKI on CKD 3B due to dehydration-improved Acute on chronic metabolic acidosis Patient presenting with bicarb of 16 and creatinine elevated up to 5.16 on admission from baseline 1.6 IV fluid Monitor renal function Nephrologist on board    Aspiration event on speech  therapist evaluation Currently n.p.o. Speech therapist following Palliative care on board and patient's family do not want any feeding tube    Acute on chronic anemia Chronic iron  deficiency Folic acid  deficiency Continue vitamin B12, folate acid as well as iron  supplementation Monitor CBC closely No indication for transfusion at this time   Dementia delirium precaution   Essential hypertension Unable to give antihypertensives on account of n.p.o. We will give as needed IV hydralazine    Generalized weakness Continue PT OT   BPH Tamsulosin  on hold due to n.p.o. status   Hyperlipidemia Holding statins as patient is n.p.o.   Prostate CA status post treatment In remission Monitor outpatient   Dementia Resume Zoloft  when no longer n.p.o.   Lung nodule CT scan C-spine showed 2.5 mm posterior left apical noncalcified lung nodule, stable in size and appearance when compared to recent CT chest in April 2024 Outpatient follow-up    No significant concerns based on BMI: Body mass index is 20.68 kg/m.  Underweight - under 18  overweight - 25 to 29 obese - 30 or more Class 1 obesity: BMI of 30.0 to 34 Class 2 obesity: BMI of 35.0 to 39 Class 3 obesity: BMI of 40.0 to 49 Super Morbid Obesity: BMI 50-59 Super-super Morbid Obesity: BMI 60+ Significantly low or high BMI is associated with higher medical risk.  Weight management advised as adjunct to other disease management and risk reduction treatments    DVT prophylaxis: heparin  d/t renal function IV fluids: *** continuous IV fluids  Nutrition: *** Central lines / invasive devices: ***  Code Status: *** ACP documentation reviewed: *** none on file in [facility]  TOC needs: *** Barriers to dispo / significant pending items: ***

## 2024-02-04 NOTE — Progress Notes (Signed)
 Patient is being discharged to [facility]. PIV in place. Called report to Hospice Home and spoke to Barbra Boone, nurse. All discharge paperwork in discharge packet as well as DNR. Patient will be transported via EMS.

## 2024-02-04 NOTE — Progress Notes (Signed)
 Palliative Care Progress Note, Assessment & Plan   Patient Name: Jon Smith       Date: 02/04/2024 DOB: 06/17/1928  Age: 89 y.o. MRN#: [id number] Attending Physician: Marsa Edelman, DO Primary Care Physician: Lyle Avelina LABOR, NP Admit Date: 01/30/2024  Subjective: Patient is lying in bed, awake, and acknowledges my presence.  He is pulling at his left mitt and asking me to take it off.  No family or friends present during my visit.  Signs of distress not noted.  HPI: 89 y.o. male  with past medical history of CKD (3B), HLD, HTN, PAD, and dementia admitted from LTC at [facility] on his 89th birthday 01/30/2024 with concerns of worsening pneumonia seen on his x-ray in his facility.  Patient has also had persistent cough.   Patient is being treated for sepsis secondary to bilateral pneumonia, hypernatremia due to decreased oral intake/dehydration, and AKI on CKD.   Summary of counseling/coordination of care: Extensive chart review completed prior to meeting patient including labs, vital signs, imaging, progress notes, orders, and available advanced directive documents from current and previous encounters.   After reviewing the patient's chart and assessing the patient at bedside, I spoke with RN in regards to symptom management and goals of care.  She endorses patient has required more IV pain and agitation medications.  Mittens are in place to keep him from removing his IV.  She and I share concerns that his symptom management needs have increased.  After counseling with RN, I spoke with attending and TOC in regards to plan of care.  I recommended a reevaluation from hospice for inpatient unit placement.  Also counseled with patient's son Jon Smith to keep him up-to-date that a new evaluation will  be performed today.  Hospice liaison evaluated and patient has been accepted to hospice home.  I again spoke with clearance to convey that patient has been accepted to hospice inpatient unit.  I also shared that I think it is time to convert to comfort measures.  I would advise liberating patient from artificial means and focusing solely on symptom management.  This would mean removing his mitts, stopping his IV fluids, and focusing on symptom management.  Clearance was in agreement.  I conveyed the above to attending it was also in agreement to shift to full comfort measures.  Orders adjusted to reflect full comfort measures.  TOC following closely to arrange discharge to hospice home.   PMT remains available patient and family throughout his hospitalization.  We will follow-up with patient tomorrow for symptom management for comfort measures if for some reason he does not transfer to hospice inpatient unit today.  Physical Exam Vitals reviewed.  Constitutional:      Comments: Thin, frail  HENT:     Mouth/Throat:     Mouth: Mucous membranes are dry.  Eyes:     Pupils: Pupils are equal, round, and reactive to light.  Pulmonary:     Effort: Pulmonary effort is normal.  Abdominal:     Palpations: Abdomen is soft.  Musculoskeletal:     Comments: Generalized weakness  Skin:    General: Skin is warm and dry.  Neurological:     Mental Status: He  is alert.  Psychiatric:        Behavior: Behavior normal.        Judgment: Judgment normal.             Total Time 50 minutes   Time spent includes: Detailed review of medical records (labs, imaging, vital signs), medically appropriate exam (mental status, respiratory, cardiac, skin), discussed with treatment team, counseling and educating patient, family and staff, documenting clinical information, medication management and coordination of care.  Lamarr L. Arvid, DNP, FNP-BC Palliative Medicine Team

## 2024-02-04 NOTE — TOC Progression Note (Signed)
 Transition of Care [facility]) - Progression Note    Patient Details  Name: Jon Smith MRN: [id number] Date of Birth: 1928/12/02  [facility] CM/SW Contact  Ladene Lady, LCSW Phone Number: 02/04/2024, 11:08 AM  Clinical Narrative: [name] for [facility] with hospice. Csw has notified son.       Expected Discharge Plan: Skilled Nursing Facility Barriers to Discharge: Continued Medical Work up  Expected Discharge Plan and Services                                               Social Determinants of Health (SDOH) Interventions SDOH Screenings   Food Insecurity: Patient Unable To Answer (01/31/2024)  Housing: Patient Unable To Answer (01/31/2024)  Transportation Needs: Patient Unable To Answer (01/31/2024)  Utilities: Patient Unable To Answer (01/31/2024)  Financial Resource Strain: High Risk (07/25/2020)   Received from [facility], [facility]  Social Connections: Unknown (01/31/2024)  Tobacco Use: Medium Risk (01/31/2024)    Readmission Risk Interventions    02/03/2024    8:48 AM  Readmission Risk Prevention Plan  Transportation Screening Complete  Medication Review (RN Care Manager) Complete  HRI or Home Care Consult Complete  Palliative Care Screening Complete  Skilled Nursing Facility Complete

## 2024-02-28 DEATH — deceased

## 2024-04-27 ENCOUNTER — Ambulatory Visit: Payer: Self-pay | Admitting: Urology
